# Patient Record
Sex: Female | Born: 1955 | Race: White | Hispanic: No | Marital: Married | Smoking: Never smoker
Health system: Southern US, Community
[De-identification: ages and names within clinical notes are randomized; demographics above are authoritative.]

## PROBLEM LIST (undated history)

## (undated) DIAGNOSIS — E785 Hyperlipidemia, unspecified: Secondary | ICD-10-CM

## (undated) DIAGNOSIS — I2699 Other pulmonary embolism without acute cor pulmonale: Secondary | ICD-10-CM

## (undated) DIAGNOSIS — I82409 Acute embolism and thrombosis of unspecified deep veins of unspecified lower extremity: Secondary | ICD-10-CM

## (undated) DIAGNOSIS — I2510 Atherosclerotic heart disease of native coronary artery without angina pectoris: Secondary | ICD-10-CM

## (undated) DIAGNOSIS — I255 Ischemic cardiomyopathy: Secondary | ICD-10-CM

## (undated) DIAGNOSIS — I10 Essential (primary) hypertension: Secondary | ICD-10-CM

## (undated) DIAGNOSIS — M1990 Unspecified osteoarthritis, unspecified site: Secondary | ICD-10-CM

## (undated) DIAGNOSIS — D689 Coagulation defect, unspecified: Secondary | ICD-10-CM

## (undated) DIAGNOSIS — H269 Unspecified cataract: Secondary | ICD-10-CM

## (undated) DIAGNOSIS — E119 Type 2 diabetes mellitus without complications: Secondary | ICD-10-CM

## (undated) DIAGNOSIS — E079 Disorder of thyroid, unspecified: Secondary | ICD-10-CM

## (undated) DIAGNOSIS — Z952 Presence of prosthetic heart valve: Secondary | ICD-10-CM

## (undated) DIAGNOSIS — I350 Nonrheumatic aortic (valve) stenosis: Secondary | ICD-10-CM

## (undated) DIAGNOSIS — R011 Cardiac murmur, unspecified: Secondary | ICD-10-CM

## (undated) DIAGNOSIS — I447 Left bundle-branch block, unspecified: Secondary | ICD-10-CM

## (undated) DIAGNOSIS — I70202 Unspecified atherosclerosis of native arteries of extremities, left leg: Secondary | ICD-10-CM

## (undated) HISTORY — DX: Left bundle-branch block, unspecified: I44.7

## (undated) HISTORY — DX: Presence of prosthetic heart valve: Z95.2

## (undated) HISTORY — DX: Unspecified osteoarthritis, unspecified site: M19.90

## (undated) HISTORY — DX: Unspecified atherosclerosis of native arteries of extremities, left leg: I70.202

## (undated) HISTORY — DX: Hyperlipidemia, unspecified: E78.5

## (undated) HISTORY — DX: Other pulmonary embolism without acute cor pulmonale: I26.99

## (undated) HISTORY — PX: ASD REPAIR: SHX258

## (undated) HISTORY — DX: Type 2 diabetes mellitus without complications: E11.9

## (undated) HISTORY — DX: Atherosclerotic heart disease of native coronary artery without angina pectoris: I25.10

## (undated) HISTORY — DX: Ischemic cardiomyopathy: I25.5

## (undated) HISTORY — DX: Disorder of thyroid, unspecified: E07.9

## (undated) HISTORY — DX: Essential (primary) hypertension: I10

## (undated) HISTORY — DX: Coagulation defect, unspecified: D68.9

## (undated) HISTORY — DX: Acute embolism and thrombosis of unspecified deep veins of unspecified lower extremity: I82.409

## (undated) HISTORY — DX: Unspecified cataract: H26.9

## (undated) HISTORY — PX: OTHER SURGICAL HISTORY: SHX169

## (undated) HISTORY — DX: Nonrheumatic aortic (valve) stenosis: I35.0

## (undated) HISTORY — PX: AORTIC VALVE REPLACEMENT (AVR)/CORONARY ARTERY BYPASS GRAFTING (CABG): SHX5725

## (undated) HISTORY — DX: Cardiac murmur, unspecified: R01.1

---

## 2001-05-05 ENCOUNTER — Encounter: Payer: Self-pay | Admitting: *Deleted

## 2001-05-05 ENCOUNTER — Ambulatory Visit (HOSPITAL_COMMUNITY): Admission: RE | Admit: 2001-05-05 | Discharge: 2001-05-05 | Payer: Self-pay | Admitting: *Deleted

## 2010-08-06 HISTORY — PX: NM MYOCAR PERF WALL MOTION: HXRAD629

## 2010-08-07 ENCOUNTER — Inpatient Hospital Stay (HOSPITAL_COMMUNITY)
Admission: EM | Admit: 2010-08-07 | Discharge: 2010-08-22 | Payer: Self-pay | Source: Home / Self Care | Attending: Internal Medicine | Admitting: Internal Medicine

## 2010-08-07 ENCOUNTER — Encounter (INDEPENDENT_AMBULATORY_CARE_PROVIDER_SITE_OTHER): Payer: Self-pay | Admitting: Internal Medicine

## 2010-08-07 HISTORY — PX: OTHER SURGICAL HISTORY: SHX169

## 2010-08-08 HISTORY — PX: OTHER SURGICAL HISTORY: SHX169

## 2010-08-08 HISTORY — PX: CARDIAC CATHETERIZATION: SHX172

## 2010-08-09 ENCOUNTER — Encounter: Payer: Self-pay | Admitting: Cardiothoracic Surgery

## 2010-08-09 DIAGNOSIS — Z01810 Encounter for preprocedural cardiovascular examination: Secondary | ICD-10-CM

## 2010-08-09 DIAGNOSIS — I2510 Atherosclerotic heart disease of native coronary artery without angina pectoris: Secondary | ICD-10-CM

## 2010-08-09 HISTORY — PX: DOPPLER ECHOCARDIOGRAPHY: SHX263

## 2010-08-12 LAB — COMPREHENSIVE METABOLIC PANEL
ALT: 16 U/L (ref 0–35)
ALT: 35 U/L (ref 0–35)
AST: 20 U/L (ref 0–37)
AST: 30 U/L (ref 0–37)
Albumin: 1.8 g/dL — ABNORMAL LOW (ref 3.5–5.2)
Albumin: 3.3 g/dL — ABNORMAL LOW (ref 3.5–5.2)
Alkaline Phosphatase: 49 U/L (ref 39–117)
Alkaline Phosphatase: 62 U/L (ref 39–117)
BUN: 10 mg/dL (ref 6–23)
BUN: 14 mg/dL (ref 6–23)
CO2: 23 mEq/L (ref 19–32)
CO2: 27 mEq/L (ref 19–32)
Calcium: 7.5 mg/dL — ABNORMAL LOW (ref 8.4–10.5)
Calcium: 8.4 mg/dL (ref 8.4–10.5)
Chloride: 108 mEq/L (ref 96–112)
Chloride: 102 mEq/L (ref 96–112)
Creatinine, Ser: 1.28 mg/dL — ABNORMAL HIGH (ref 0.4–1.2)
Creatinine, Ser: 0.85 mg/dL (ref 0.4–1.2)
GFR calc Af Amer: 53 mL/min — ABNORMAL LOW (ref >60)
GFR calc Af Amer: >60 mL/min (ref >60)
GFR calc non Af Amer: 44 mL/min — ABNORMAL LOW (ref >60)
GFR calc non Af Amer: >60 mL/min (ref >60)
Glucose, Bld: 93 mg/dL (ref 70–99)
Glucose, Bld: 191 mg/dL — ABNORMAL HIGH (ref 70–99)
Potassium: 3.7 mEq/L (ref 3.5–5.1)
Potassium: 3.3 mEq/L — ABNORMAL LOW (ref 3.5–5.1)
Sodium: 137 mEq/L (ref 135–145)
Sodium: 137 mEq/L (ref 135–145)
Total Bilirubin: 0.6 mg/dL (ref 0.3–1.2)
Total Bilirubin: 1.8 mg/dL — ABNORMAL HIGH (ref 0.3–1.2)
Total Protein: 4.7 g/dL — ABNORMAL LOW (ref 6.0–8.3)
Total Protein: 5.9 g/dL — ABNORMAL LOW (ref 6.0–8.3)

## 2010-08-12 LAB — URINE CULTURE
Colony Count: NO GROWTH
Culture  Setup Time: 201201121938
Culture: NO GROWTH

## 2010-08-12 LAB — GLUCOSE, CAPILLARY
Glucose-Capillary: 287 mg/dL — ABNORMAL HIGH (ref 70–99)
Glucose-Capillary: 269 mg/dL — ABNORMAL HIGH (ref 70–99)
Glucose-Capillary: 187 mg/dL — ABNORMAL HIGH (ref 70–99)
Glucose-Capillary: 205 mg/dL — ABNORMAL HIGH (ref 70–99)
Glucose-Capillary: 202 mg/dL — ABNORMAL HIGH (ref 70–99)
Glucose-Capillary: 283 mg/dL — ABNORMAL HIGH (ref 70–99)
Glucose-Capillary: 218 mg/dL — ABNORMAL HIGH (ref 70–99)
Glucose-Capillary: 264 mg/dL — ABNORMAL HIGH (ref 70–99)
Glucose-Capillary: 189 mg/dL — ABNORMAL HIGH (ref 70–99)
Glucose-Capillary: 171 mg/dL — ABNORMAL HIGH (ref 70–99)
Glucose-Capillary: 247 mg/dL — ABNORMAL HIGH (ref 70–99)
Glucose-Capillary: 259 mg/dL — ABNORMAL HIGH (ref 70–99)

## 2010-08-12 LAB — URINALYSIS, ROUTINE W REFLEX MICROSCOPIC
Bilirubin Urine: NEGATIVE
Ketones, ur: NEGATIVE mg/dL
Leukocytes, UA: NEGATIVE
Nitrite: NEGATIVE
Protein, ur: NEGATIVE mg/dL
Specific Gravity, Urine: 1.014 (ref 1.005–1.030)
Urine Glucose, Fasting: NEGATIVE mg/dL
Urobilinogen, UA: 0.2 mg/dL (ref 0.0–1.0)
pH: 8.5 — ABNORMAL HIGH (ref 5.0–8.0)

## 2010-08-12 LAB — BASIC METABOLIC PANEL
BUN: 13 mg/dL (ref 6–23)
BUN: 12 mg/dL (ref 6–23)
BUN: 16 mg/dL (ref 6–23)
CO2: 28 mEq/L (ref 19–32)
CO2: 23 mEq/L (ref 19–32)
CO2: 26 mEq/L (ref 19–32)
Calcium: 8.7 mg/dL (ref 8.4–10.5)
Calcium: 8.9 mg/dL (ref 8.4–10.5)
Calcium: 9 mg/dL (ref 8.4–10.5)
Chloride: 100 mEq/L (ref 96–112)
Chloride: 106 mEq/L (ref 96–112)
Chloride: 106 mEq/L (ref 96–112)
Creatinine, Ser: 0.76 mg/dL (ref 0.4–1.2)
Creatinine, Ser: 0.73 mg/dL (ref 0.4–1.2)
Creatinine, Ser: 0.8 mg/dL (ref 0.4–1.2)
GFR calc Af Amer: >60 mL/min (ref >60)
GFR calc Af Amer: >60 mL/min (ref >60)
GFR calc Af Amer: >60 mL/min (ref >60)
GFR calc non Af Amer: >60 mL/min (ref >60)
GFR calc non Af Amer: >60 mL/min (ref >60)
GFR calc non Af Amer: >60 mL/min (ref >60)
Glucose, Bld: 179 mg/dL — ABNORMAL HIGH (ref 70–99)
Glucose, Bld: 191 mg/dL — ABNORMAL HIGH (ref 70–99)
Glucose, Bld: 135 mg/dL — ABNORMAL HIGH (ref 70–99)
Potassium: 3 mEq/L — ABNORMAL LOW (ref 3.5–5.1)
Potassium: 3.7 mEq/L (ref 3.5–5.1)
Potassium: 3.3 mEq/L — ABNORMAL LOW (ref 3.5–5.1)
Sodium: 138 mEq/L (ref 135–145)
Sodium: 138 mEq/L (ref 135–145)
Sodium: 141 mEq/L (ref 135–145)

## 2010-08-12 LAB — DIFFERENTIAL
Basophils Absolute: 0 10*3/uL (ref 0.0–0.1)
Basophils Relative: 0 % (ref 0–1)
Eosinophils Absolute: 0.2 10*3/uL (ref 0.0–0.7)
Eosinophils Relative: 2 % (ref 0–5)
Lymphocytes Relative: 32 % (ref 12–46)
Lymphs Abs: 3.5 10*3/uL (ref 0.7–4.0)
Monocytes Absolute: 0.6 10*3/uL (ref 0.1–1.0)
Monocytes Relative: 5 % (ref 3–12)
Neutro Abs: 6.5 10*3/uL (ref 1.7–7.7)
Neutrophils Relative %: 61 % (ref 43–77)

## 2010-08-12 LAB — POCT I-STAT 3, VENOUS BLOOD GAS (G3P V)
Acid-Base Excess: 3 mmol/L — ABNORMAL HIGH (ref 0.0–2.0)
Bicarbonate: 27.9 mEq/L — ABNORMAL HIGH (ref 20.0–24.0)
O2 Saturation: 61 %
TCO2: 29 mmol/L (ref 0–100)
pCO2, Ven: 41.4 mmHg — ABNORMAL LOW (ref 45.0–50.0)
pH, Ven: 7.437 — ABNORMAL HIGH (ref 7.250–7.300)
pO2, Ven: 31 mmHg (ref 30.0–45.0)

## 2010-08-12 LAB — CBC
HCT: 44.2 % (ref 36.0–46.0)
HCT: 39.6 % (ref 36.0–46.0)
HCT: 43.2 % (ref 36.0–46.0)
HCT: 40.6 % (ref 36.0–46.0)
Hemoglobin: 14.6 g/dL (ref 12.0–15.0)
Hemoglobin: 12.6 g/dL (ref 12.0–15.0)
Hemoglobin: 13.7 g/dL (ref 12.0–15.0)
Hemoglobin: 12.8 g/dL (ref 12.0–15.0)
MCH: 28.4 pg (ref 26.0–34.0)
MCH: 27.6 pg (ref 26.0–34.0)
MCH: 27.6 pg (ref 26.0–34.0)
MCH: 27.6 pg (ref 26.0–34.0)
MCHC: 33 g/dL (ref 30.0–36.0)
MCHC: 31.8 g/dL (ref 30.0–36.0)
MCHC: 31.7 g/dL (ref 30.0–36.0)
MCHC: 31.5 g/dL (ref 30.0–36.0)
MCV: 86 fL (ref 78.0–100.0)
MCV: 86.8 fL (ref 78.0–100.0)
MCV: 87.1 fL (ref 78.0–100.0)
MCV: 87.5 fL (ref 78.0–100.0)
Platelets: 316 10*3/uL (ref 150–400)
Platelets: 258 10*3/uL (ref 150–400)
Platelets: 253 10*3/uL (ref 150–400)
Platelets: 267 10*3/uL (ref 150–400)
RBC: 5.14 MIL/uL — ABNORMAL HIGH (ref 3.87–5.11)
RBC: 4.56 MIL/uL (ref 3.87–5.11)
RBC: 4.96 MIL/uL (ref 3.87–5.11)
RBC: 4.64 MIL/uL (ref 3.87–5.11)
RDW: 15 % (ref 11.5–15.5)
RDW: 14.7 % (ref 11.5–15.5)
RDW: 14.9 % (ref 11.5–15.5)
RDW: 14.8 % (ref 11.5–15.5)
WBC: 10.7 10*3/uL — ABNORMAL HIGH (ref 4.0–10.5)
WBC: 10.1 10*3/uL (ref 4.0–10.5)
WBC: 10.7 10*3/uL — ABNORMAL HIGH (ref 4.0–10.5)
WBC: 10.2 10*3/uL (ref 4.0–10.5)

## 2010-08-12 LAB — HOMOCYSTEINE: Homocysteine: 14.4 umol/L (ref 4.0–15.4)

## 2010-08-12 LAB — BLOOD GAS, ARTERIAL
Acid-Base Excess: 3.2 mmol/L — ABNORMAL HIGH (ref 0.0–2.0)
Bicarbonate: 26.2 mEq/L — ABNORMAL HIGH (ref 20.0–24.0)
Drawn by: 270271
FIO2: 0.21 %
O2 Saturation: 94 %
Patient temperature: 98.6
TCO2: 27.2 mmol/L (ref 0–100)
pCO2 arterial: 33.2 mmHg — ABNORMAL LOW (ref 35.0–45.0)
pH, Arterial: 7.509 — ABNORMAL HIGH (ref 7.350–7.400)
pO2, Arterial: 63.6 mmHg — ABNORMAL LOW (ref 80.0–100.0)

## 2010-08-12 LAB — POCT I-STAT 3, ART BLOOD GAS (G3+)
Acid-Base Excess: 3 mmol/L — ABNORMAL HIGH (ref 0.0–2.0)
Bicarbonate: 25.9 mEq/L — ABNORMAL HIGH (ref 20.0–24.0)
O2 Saturation: 95 %
TCO2: 27 mmol/L (ref 0–100)
pCO2 arterial: 34.7 mmHg — ABNORMAL LOW (ref 35.0–45.0)
pH, Arterial: 7.482 — ABNORMAL HIGH (ref 7.350–7.400)
pO2, Arterial: 71 mmHg — ABNORMAL LOW (ref 80.0–100.0)

## 2010-08-12 LAB — CARDIAC PANEL(CRET KIN+CKTOT+MB+TROPI)
CK, MB: 2.4 ng/mL (ref 0.3–4.0)
CK, MB: 1.1 ng/mL (ref 0.3–4.0)
CK, MB: 0.9 ng/mL (ref 0.3–4.0)
Relative Index: INVALID (ref 0.0–2.5)
Relative Index: INVALID (ref 0.0–2.5)
Relative Index: INVALID (ref 0.0–2.5)
Total CK: 30 U/L (ref 7–177)
Total CK: 28 U/L (ref 7–177)
Total CK: 29 U/L (ref 7–177)
Troponin I: 0.04 ng/mL (ref 0.00–0.06)
Troponin I: 0.02 ng/mL (ref 0.00–0.06)
Troponin I: 0.05 ng/mL (ref 0.00–0.06)

## 2010-08-12 LAB — HEMOGLOBIN A1C
Hgb A1c MFr Bld: 12.1 % — ABNORMAL HIGH (ref <5.7)
Mean Plasma Glucose: 301 mg/dL — ABNORMAL HIGH (ref <117)

## 2010-08-12 LAB — HEPARIN LEVEL (UNFRACTIONATED)
Heparin Unfractionated: <0.1 IU/mL — ABNORMAL LOW (ref 0.30–0.70)
Heparin Unfractionated: 0.23 IU/mL — ABNORMAL LOW (ref 0.30–0.70)
Heparin Unfractionated: 0.42 IU/mL (ref 0.30–0.70)
Heparin Unfractionated: 0.44 IU/mL (ref 0.30–0.70)
Heparin Unfractionated: 0.96 IU/mL — ABNORMAL HIGH (ref 0.30–0.70)
Heparin Unfractionated: <0.1 IU/mL — ABNORMAL LOW (ref 0.30–0.70)
Heparin Unfractionated: 0.4 IU/mL (ref 0.30–0.70)

## 2010-08-12 LAB — BRAIN NATRIURETIC PEPTIDE
Pro B Natriuretic peptide (BNP): 817 pg/mL — ABNORMAL HIGH (ref 0.0–100.0)
Pro B Natriuretic peptide (BNP): 1407 pg/mL — ABNORMAL HIGH (ref 0.0–100.0)
Pro B Natriuretic peptide (BNP): 578 pg/mL — ABNORMAL HIGH (ref 0.0–100.0)

## 2010-08-12 LAB — MRSA PCR SCREENING: MRSA by PCR: NEGATIVE

## 2010-08-12 LAB — PROTIME-INR
INR: 1.1 (ref 0.00–1.49)
Prothrombin Time: 14.4 seconds (ref 11.6–15.2)

## 2010-08-12 LAB — ANTITHROMBIN III: AntiThromb III Func: 91 % (ref 75–120)

## 2010-08-12 LAB — TSH: TSH: 1.597 u[IU]/mL (ref 0.350–4.500)

## 2010-08-12 LAB — APTT: aPTT: 31 seconds (ref 24–37)

## 2010-08-12 LAB — URINE MICROSCOPIC-ADD ON

## 2010-08-14 LAB — URINALYSIS, MICROSCOPIC ONLY
Bilirubin Urine: NEGATIVE
Ketones, ur: NEGATIVE mg/dL
Leukocytes, UA: NEGATIVE
Nitrite: NEGATIVE
Protein, ur: NEGATIVE mg/dL
Specific Gravity, Urine: 1.01 (ref 1.005–1.030)
Urine Glucose, Fasting: NEGATIVE mg/dL
Urobilinogen, UA: 1 mg/dL (ref 0.0–1.0)
pH: 6 (ref 5.0–8.0)

## 2010-08-14 LAB — BASIC METABOLIC PANEL
BUN: 16 mg/dL (ref 6–23)
BUN: 17 mg/dL (ref 6–23)
CO2: 24 mEq/L (ref 19–32)
CO2: 26 mEq/L (ref 19–32)
Calcium: 9 mg/dL (ref 8.4–10.5)
Calcium: 9.3 mg/dL (ref 8.4–10.5)
Chloride: 104 mEq/L (ref 96–112)
Chloride: 106 mEq/L (ref 96–112)
Creatinine, Ser: 0.75 mg/dL (ref 0.4–1.2)
Creatinine, Ser: 0.74 mg/dL (ref 0.4–1.2)
GFR calc Af Amer: >60 mL/min (ref >60)
GFR calc Af Amer: >60 mL/min (ref >60)
GFR calc non Af Amer: >60 mL/min (ref >60)
GFR calc non Af Amer: >60 mL/min (ref >60)
Glucose, Bld: 116 mg/dL — ABNORMAL HIGH (ref 70–99)
Glucose, Bld: 142 mg/dL — ABNORMAL HIGH (ref 70–99)
Potassium: 3.6 mEq/L (ref 3.5–5.1)
Potassium: 3.7 mEq/L (ref 3.5–5.1)
Sodium: 137 mEq/L (ref 135–145)
Sodium: 140 mEq/L (ref 135–145)

## 2010-08-14 LAB — BRAIN NATRIURETIC PEPTIDE: Pro B Natriuretic peptide (BNP): 407 pg/mL — ABNORMAL HIGH (ref 0.0–100.0)

## 2010-08-14 LAB — CBC
HCT: 39.5 % (ref 36.0–46.0)
HCT: 41.1 % (ref 36.0–46.0)
Hemoglobin: 12 g/dL (ref 12.0–15.0)
Hemoglobin: 13 g/dL (ref 12.0–15.0)
MCH: 26.5 pg (ref 26.0–34.0)
MCH: 27.4 pg (ref 26.0–34.0)
MCHC: 30.4 g/dL (ref 30.0–36.0)
MCHC: 31.6 g/dL (ref 30.0–36.0)
MCV: 87.2 fL (ref 78.0–100.0)
MCV: 86.5 fL (ref 78.0–100.0)
Platelets: 258 10*3/uL (ref 150–400)
Platelets: 265 10*3/uL (ref 150–400)
RBC: 4.53 MIL/uL (ref 3.87–5.11)
RBC: 4.75 MIL/uL (ref 3.87–5.11)
RDW: 14.7 % (ref 11.5–15.5)
RDW: 14.7 % (ref 11.5–15.5)
WBC: 9.2 10*3/uL (ref 4.0–10.5)
WBC: 9.9 10*3/uL (ref 4.0–10.5)

## 2010-08-14 LAB — GLUCOSE, CAPILLARY
Glucose-Capillary: 201 mg/dL — ABNORMAL HIGH (ref 70–99)
Glucose-Capillary: 234 mg/dL — ABNORMAL HIGH (ref 70–99)
Glucose-Capillary: 189 mg/dL — ABNORMAL HIGH (ref 70–99)
Glucose-Capillary: 139 mg/dL — ABNORMAL HIGH (ref 70–99)
Glucose-Capillary: 207 mg/dL — ABNORMAL HIGH (ref 70–99)
Glucose-Capillary: 181 mg/dL — ABNORMAL HIGH (ref 70–99)
Glucose-Capillary: 259 mg/dL — ABNORMAL HIGH (ref 70–99)
Glucose-Capillary: 172 mg/dL — ABNORMAL HIGH (ref 70–99)
Glucose-Capillary: 137 mg/dL — ABNORMAL HIGH (ref 70–99)
Glucose-Capillary: 202 mg/dL — ABNORMAL HIGH (ref 70–99)

## 2010-08-14 LAB — LUPUS ANTICOAGULANT PANEL
DRVVT: 45.6 secs — ABNORMAL HIGH (ref 36.2–44.3)
Lupus Anticoagulant: NOT DETECTED
PTT Lupus Anticoagulant: 39.2 secs (ref 30.0–45.6)
dRVVT Incubated 1:1 Mix: 42.5 secs (ref 36.2–44.3)

## 2010-08-14 LAB — HEPARIN LEVEL (UNFRACTIONATED)
Heparin Unfractionated: 0.55 IU/mL (ref 0.30–0.70)
Heparin Unfractionated: 0.57 IU/mL (ref 0.30–0.70)

## 2010-08-14 LAB — CARDIOLIPIN ANTIBODIES, IGG, IGM, IGA
Anticardiolipin IgA: 1 APL U/mL — ABNORMAL LOW (ref <22)
Anticardiolipin IgG: 2 GPL U/mL — ABNORMAL LOW (ref <23)
Anticardiolipin IgM: 5 MPL U/mL — ABNORMAL LOW (ref <11)

## 2010-08-16 ENCOUNTER — Encounter: Payer: Self-pay | Admitting: Cardiothoracic Surgery

## 2010-08-16 HISTORY — PX: CORONARY ARTERY BYPASS GRAFT: SHX141

## 2010-08-19 LAB — PROTEIN C, TOTAL: Protein C, Total: 117 % (ref 70–140)

## 2010-08-19 LAB — COMPREHENSIVE METABOLIC PANEL
ALT: 21 U/L (ref 0–35)
AST: 20 U/L (ref 0–37)
Albumin: 3.5 g/dL (ref 3.5–5.2)
Alkaline Phosphatase: 69 U/L (ref 39–117)
BUN: 15 mg/dL (ref 6–23)
CO2: 22 mEq/L (ref 19–32)
Calcium: 9.2 mg/dL (ref 8.4–10.5)
Chloride: 106 mEq/L (ref 96–112)
Creatinine, Ser: 0.74 mg/dL (ref 0.4–1.2)
GFR calc Af Amer: >60 mL/min (ref >60)
GFR calc non Af Amer: >60 mL/min (ref >60)
Glucose, Bld: 114 mg/dL — ABNORMAL HIGH (ref 70–99)
Potassium: 3.5 mEq/L (ref 3.5–5.1)
Sodium: 139 mEq/L (ref 135–145)
Total Bilirubin: 0.8 mg/dL (ref 0.3–1.2)
Total Protein: 6.6 g/dL (ref 6.0–8.3)

## 2010-08-19 LAB — CBC
HCT: 39.5 % (ref 36.0–46.0)
HCT: 37.1 % (ref 36.0–46.0)
HCT: 26.4 % — ABNORMAL LOW (ref 36.0–46.0)
HCT: 27 % — ABNORMAL LOW (ref 36.0–46.0)
Hemoglobin: 12.6 g/dL (ref 12.0–15.0)
Hemoglobin: 11.4 g/dL — ABNORMAL LOW (ref 12.0–15.0)
Hemoglobin: 8.5 g/dL — ABNORMAL LOW (ref 12.0–15.0)
Hemoglobin: 8.9 g/dL — ABNORMAL LOW (ref 12.0–15.0)
MCH: 27.5 pg (ref 26.0–34.0)
MCH: 26.9 pg (ref 26.0–34.0)
MCH: 28.1 pg (ref 26.0–34.0)
MCH: 28.7 pg (ref 26.0–34.0)
MCHC: 31.9 g/dL (ref 30.0–36.0)
MCHC: 30.7 g/dL (ref 30.0–36.0)
MCHC: 32.2 g/dL (ref 30.0–36.0)
MCHC: 33 g/dL (ref 30.0–36.0)
MCV: 86.1 fL (ref 78.0–100.0)
MCV: 87.5 fL (ref 78.0–100.0)
MCV: 87.4 fL (ref 78.0–100.0)
MCV: 87.1 fL (ref 78.0–100.0)
Platelets: 271 10*3/uL (ref 150–400)
Platelets: 258 10*3/uL (ref 150–400)
Platelets: 293 10*3/uL (ref 150–400)
Platelets: 139 10*3/uL — ABNORMAL LOW (ref 150–400)
Platelets: 141 10*3/uL — ABNORMAL LOW (ref 150–400)
RBC: 4.59 MIL/uL (ref 3.87–5.11)
RBC: 4.01 MIL/uL (ref 3.87–5.11)
RBC: 4.24 MIL/uL (ref 3.87–5.11)
RBC: 3.02 MIL/uL — ABNORMAL LOW (ref 3.87–5.11)
RBC: 3.1 MIL/uL — ABNORMAL LOW (ref 3.87–5.11)
RDW: 14.8 % (ref 11.5–15.5)
RDW: 15.2 % (ref 11.5–15.5)
RDW: 15.3 % (ref 11.5–15.5)
RDW: 14.6 % (ref 11.5–15.5)
RDW: 14.8 % (ref 11.5–15.5)
WBC: 10.3 10*3/uL (ref 4.0–10.5)
WBC: 8.6 10*3/uL (ref 4.0–10.5)
WBC: 11.7 10*3/uL — ABNORMAL HIGH (ref 4.0–10.5)
WBC: 8.3 10*3/uL (ref 4.0–10.5)
WBC: 8.8 10*3/uL (ref 4.0–10.5)

## 2010-08-19 LAB — POCT I-STAT 3, ART BLOOD GAS (G3+)
Bicarbonate: 25 mEq/L — ABNORMAL HIGH (ref 20.0–24.0)
Bicarbonate: 22.3 mEq/L (ref 20.0–24.0)
Patient temperature: 35.6
Patient temperature: 37.4
TCO2: 26 mmol/L (ref 0–100)
TCO2: 23 mmol/L (ref 0–100)
pCO2 arterial: 42.1 mmHg (ref 35.0–45.0)
pCO2 arterial: 36.6 mmHg (ref 35.0–45.0)
pH, Arterial: 7.376 (ref 7.350–7.400)
pH, Arterial: 7.394 (ref 7.350–7.400)
pO2, Arterial: 56 mmHg — ABNORMAL LOW (ref 80.0–100.0)

## 2010-08-19 LAB — GLUCOSE, CAPILLARY
Glucose-Capillary: 277 mg/dL — ABNORMAL HIGH (ref 70–99)
Glucose-Capillary: 113 mg/dL — ABNORMAL HIGH (ref 70–99)
Glucose-Capillary: 107 mg/dL — ABNORMAL HIGH (ref 70–99)
Glucose-Capillary: 197 mg/dL — ABNORMAL HIGH (ref 70–99)
Glucose-Capillary: 146 mg/dL — ABNORMAL HIGH (ref 70–99)

## 2010-08-19 LAB — URINE CULTURE
Colony Count: >=100000
Culture  Setup Time: 201201171616
Special Requests: NEGATIVE

## 2010-08-19 LAB — APTT: aPTT: 106 seconds — ABNORMAL HIGH (ref 24–37)

## 2010-08-19 LAB — PROTIME-INR
INR: 1.12 (ref 0.00–1.49)
Prothrombin Time: 14.6 seconds (ref 11.6–15.2)

## 2010-08-19 LAB — BASIC METABOLIC PANEL
BUN: 10 mg/dL (ref 6–23)
CO2: 26 mEq/L (ref 19–32)
Calcium: 9.5 mg/dL (ref 8.4–10.5)
Chloride: 104 mEq/L (ref 96–112)
Creatinine, Ser: 0.78 mg/dL (ref 0.4–1.2)
GFR calc Af Amer: >60 mL/min (ref >60)
GFR calc non Af Amer: >60 mL/min (ref >60)
Glucose, Bld: 182 mg/dL — ABNORMAL HIGH (ref 70–99)
Potassium: 4.3 mEq/L (ref 3.5–5.1)
Sodium: 139 mEq/L (ref 135–145)

## 2010-08-19 LAB — POCT I-STAT, CHEM 8
BUN: 7 mg/dL (ref 6–23)
Calcium, Ion: 1.17 mmol/L (ref 1.12–1.32)
Chloride: 112 mEq/L (ref 96–112)
Creatinine, Ser: 0.7 mg/dL (ref 0.4–1.2)
Glucose, Bld: 160 mg/dL — ABNORMAL HIGH (ref 70–99)

## 2010-08-19 LAB — CREATININE, SERUM
Creatinine, Ser: 0.72 mg/dL (ref 0.4–1.2)
GFR calc Af Amer: >60 mL/min (ref >60)
GFR calc non Af Amer: >60 mL/min (ref >60)

## 2010-08-19 LAB — BRAIN NATRIURETIC PEPTIDE: Pro B Natriuretic peptide (BNP): 289 pg/mL — ABNORMAL HIGH (ref 0.0–100.0)

## 2010-08-19 LAB — PLATELET COUNT: Platelets: 139 10*3/uL — ABNORMAL LOW (ref 150–400)

## 2010-08-19 LAB — POCT I-STAT 4, (NA,K, GLUC, HGB,HCT): Glucose, Bld: 133 mg/dL — ABNORMAL HIGH (ref 70–99)

## 2010-08-19 LAB — FACTOR 5 LEIDEN

## 2010-08-19 LAB — HEPARIN LEVEL (UNFRACTIONATED): Heparin Unfractionated: 0.66 IU/mL (ref 0.30–0.70)

## 2010-08-19 LAB — MAGNESIUM: Magnesium: 3.1 mg/dL — ABNORMAL HIGH (ref 1.5–2.5)

## 2010-08-19 LAB — HEMOGLOBIN AND HEMATOCRIT, BLOOD
HCT: 35.3 % — ABNORMAL LOW (ref 36.0–46.0)
Hemoglobin: 11 g/dL — ABNORMAL LOW (ref 12.0–15.0)

## 2010-08-19 NOTE — Op Note (Signed)
NAMEYOUA, GOELZ NO.:  [id number]  MEDICAL RECORD NO.:  [id number]          PATIENT TYPE:  INP  LOCATION:  2307                         FACILITY:  [facility]  PHYSICIAN:  Kerin Perna, M.D.  DATE OF BIRTH:  [birth year]  DATE OF PROCEDURE:  08/16/2010 DATE OF DISCHARGE:                              OPERATIVE REPORT   OPERATIONS: 1. Aortic valve replacement with a 19-mm mechanical St. Jude valve     (serial number [id number]). 2. Coronary artery bypass grafting x3 (left internal mammary artery to     LAD, saphenous vein graft to distal circumflex, saphenous vein     graft to posterior descending).  SURGEON:  Kerin Perna, MD  ASSISTANT:  Coral Ceo, PA-C  PREOPERATIVE DIAGNOSES: 1. Critical aortic stenosis. 2. Moderate to severe left main and three-vessel coronary artery     disease. 3. Class 3 congestive heart failure.  POSTOPERATIVE DIAGNOSES: 1. Critical aortic stenosis. 2. Moderate to severe left main and three-vessel coronary artery     disease. 3. Class 3 congestive heart failure.  ANESTHESIA:  General by Dr. Arta Bruce.  INDICATIONS:  The patient is a 55 year old newly diagnosed diabetic female who presented with symptoms of heart failure and a 2-D echo at her cardiologist's office indicating severe aortic stenosis.  She was also found to have an occluded left popliteal artery and severe diabetes mellitus as a new diagnosis.  She underwent combined right and left heart cath and PCI of her popliteal artery approximately 1 week ago. She was found to have aortic valve area of 0.5 with a gradient of 80 mmHg.  Her EF was 40% and she had a 50% left main stenosis and subtotal stenosis of the circumflex and posterior descending.  Within 48 hours of cath, she had persistent chest pain and a CT angiogram demonstrated pulmonary emboli to the right lung.  She was maintained on IV heparin for an additional 6 days to allow recover from the pulmonary  embolus until this operation was scheduled.  During that period of time, her symptoms of CHF were improved, her diabetes was much more readily controlled, and she had good perfusion of both lower extremities.  She did sustain a groin bleed at her cath site approximately 1 week after the cath and required 2 blood cell transfusions.  Prior to surgery, I reviewed the results of her echo and cath and discussed the indications and benefits of aortic valve replacement with combined coronary artery bypass grafting for treatment of severe aortic stenosis and three-vessel coronary artery disease.  It was her preference with which I agreed that she have a mechanical aortic valve replacement due to her age of 3 years.  She understood that she would require long-term Coumadin for the pulmonary embolus probably for at least a year.  I discussed the major issues of the surgery including location of the surgical incisions, the choice of conduit to include internal mammary artery and endoscopically harvested saphenous vein, the use of general anesthesia and cardiopulmonary bypass, and the expected postoperative hospital recovery.  She understood the risks of this operation to include stroke, MI, bleeding,  blood transfusion requirement, infection, and death.  After reviewing these issues, she demonstrated her understanding and agreed to proceed with surgery under what I felt was an informed consent.  OPERATIVE FINDINGS: 1. Severe bicuspid aortic stenosis corrected with a St. Jude AVR with     TEE demonstrating good valvular function postop. 2. Intraoperative anemia requiring 2 units of packed cell transfusion. 3. Significant pulmonary hypertension with PA pressures of 60/30 after     induction of anesthesia which improved to 40/20 after AVR.  PROCEDURE:  The patient was brought to the operating room and placed supine on the operating table where general anesthesia was induced.  A transesophageal 2-D  echo probe was placed by Dr. Michelle Piper.  This confirmed the preoperative diagnosis of severe aortic stenosis, mild MR, and mild to moderate LV dysfunction.  There was no evidence of an ASD per report. The patient was prepped and draped as a sterile field.  A sternal incision was made as the saphenous vein was harvested from the left leg. The saphenous vein was visualized in the right leg, but was too small to use.  It was not harvested from the right leg.  The vein in left leg was good quality.  The left internal mammary artery was harvested as a pedicle graft.  This was a small 1.2-mm vessel, but had excellent flow. The sternal retractor was placed and the pericardium was opened and suspended.  The right heart was enlarged.  The pulmonary artery was enlarged.  After the vein was harvested, heparin was administered and the ACT was documented as being therapeutic.  Pursestrings were placed in the ascending aorta and right atrium and the patient was cannulated and placed on cardiopulmonary bypass.  A left ventricular vent was placed via the right superior pulmonary vein.  The coronaries were identified for grafting and the mammary artery and vein grafts were prepared for the distal anastomoses.  Cardioplegia catheters were placed for both antegrade and retrograde cold blood cardioplegia.  The patient was cooled to 32 degrees and the aortic crossclamp was applied.  800 mL of cold blood cardioplegia was delivered in split doses between the antegrade aortic and retrograde coronary sinus catheters.  There was a good cardioplegic arrest and septal temperature dropped less than 12 degrees.  Cardioplegia was delivered every 20 minutes or less while the crossclamp was in place.  The distal coronary anastomoses were then performed.  First distal anastomosis was to the posterior descending branch of the right coronary artery.  This was a 1.5-mm vessel with proximal 80% stenosis.  Reversed saphenous vein  was sewn end-to-side with running 7-0 Prolene with good flow through the graft.  The second distal anastomosis was to the distal circumflex.  This had diffuse atherosclerotic disease.  There was a proximal 50% left main stenosis.  The vessel itself was only 1.2 mm.  A reversed saphenous vein was sewn end-to-side with running 7-0 Prolene. There was adequate flow through graft.  The third distal anastomosis was to the LAD.  This had diffuse atherosclerotic disease and inflammation. It was, however, a 1.5-mm vessel.  The left IMA pedicle was brought through an opening and the pericardium was brought down onto the LAD and sewn end-to-side with running 8-0 Prolene.  There was good flow through the anastomosis after briefly releasing the pedicle bulldog on the mammary artery.  The bulldog was reapplied and a pedicle was secured to the epicardium.  Cardioplegia was redosed.  Attention was then directed to the aortic valve.  A transverse aortotomy was performed.  The aortic valve was inspected.  It was bicuspid, calcified, stenotic, thickened, and almost immobile.  It was excised and the annulus was debrided carefully of calcium.  The outflow tract was irrigated with copious amounts of cold saline.  The annulus was sized to a 19-mm St. Jude valve.  The preop echo measured the LV outflow tract at 19 mm.  Subannular 2-0 Ethibond sutures were placed around the annulus numbering 14 sutures in total.  The St. Jude valve was then placed on the sterile field and the suture was passed to the sewing ring and the valve was seated and oriented in proper position and the sutures were tied.  The valve opened and closed without impingement and there was no evidence of spacing in between the sutures and the coronary ostium were widely patent.  The aortotomy was then closed in a 2-layer running suture of 4-0 Prolene with pledgets at the ends.  Cardioplegia was redosed.  At this point, the two proximal vein  anastomoses were performed on the ascending aorta using a 4.0-mm punch with running 7-0 Prolene.  Prior to tying down the final proximal anastomosis, air was vented from the coronaries with a dose of retrograde warm blood cardioplegia.  The crossclamp was then removed.  The heart resumed a spontaneous rhythm.  The cardioplegia catheters were removed.  Air was aspirated from the vein grafts with a 27 gauge needle. The vein grafts were opened and each had adequate flow.  The proximal and distal anastomoses were checked and found to be hemostatic.  The aortotomy was checked and found to be hemostatic.  Temporary pacing wires were applied.  The patient was rewarmed to 37 degrees.  The superior pericardial fat was closed over the aorta.  Two mediastinal and a left pleural chest tube were placed and brought out through the separate incisions.  The sternum was closed with interrupted steel wire. The pectoralis fascia was closed using running #1 Vicryl.  Subcutaneous and skin layers were closed using running Vicryl and sterile dressings were applied.  Total bypass time was 181 minutes.     Kerin Perna, M.D.     PV/MEDQ  D:  08/16/2010  T:  08/17/2010  Job:  [id number]  cc:   Nanetta Batty, M.D.  Electronically Signed by Kerin Perna M.D. on 08/19/2010 08:23:33 AM

## 2010-08-19 NOTE — Consult Note (Signed)
NAMERAYNEE, IDA NO.:  [id number]  MEDICAL RECORD NO.:  [id number]          PATIENT TYPE:  INP  LOCATION:  2926                         FACILITY:  [facility]  PHYSICIAN:  Kerin Perna, M.D.  DATE OF BIRTH:  08-Oct-1955  DATE OF CONSULTATION:  08/08/2010 DATE OF DISCHARGE:                                CONSULTATION   REASON FOR CONSULTATION:  Severe aortic stenosis, moderate three-vessel coronary artery disease, class III CHF.  HISTORY OF PRESENT ILLNESS:  I was asked to evaluate this 55 year old Caucasian female, diabetic, nonsmoker for possible aortic valve replacement combined with coronary artery bypass grafting.  She was recently evaluated by Dr. Rennis Golden for exertional dyspnea, chest discomfort, ischemic left leg pain, and decreased exercise tolerance.  A 2-D echo in the office demonstrated EF of 40% with global hypokinesia and severe aortic stenosis with a peak gradient of 77 mmHg.  Left ventricular outflow tract.  Diameter was 19 mm.  She had a heavily calcified valve.  She had mild-to-moderate MR and mild-to-moderate TR and trace AI.  The patient underwent a stress test, which was fairly at low risk.  However, the EF was reduced at 35%.  A duplex ultrasound of her left leg demonstrated no DVT, but she did have evidence of a thrombus in the left popliteal artery.  Her blood glucose was elevated at 360, her BNP was elevated at 1230, and her total cholesterol was 229. Fortunately, her creatinine was normal at 0.87.  Because of all these problems, she was admitted to the hospital and placed on IV heparin.  On August 08, 2010, she underwent a left and right heart cath by Dr. Allyson Sabal.  This demonstrated a 50% left main stenosis, 60% stenosis of the LAD, 80% stenosis of proximal posterior descending branch of the right coronary, and 60% stenosis of the circumflex.  The catheter did not cross the aortic valve.  PA pressures were elevated at 60/30 and  cardiac output was 4 liters per minute.  Pulmonary capillary wedge pressure was 27.  The patient also underwent percutaneous intervention of a thrombus in the left popliteal artery, which was treated with a PTCA and Angiomax infusion and opened as confirmed by angiography.  She had no significant renal artery stenosis or abdominal iliac disease.  Because of her severe aortic stenosis due to a bicuspid valve and moderate three-vessel disease with reduced LV function, a cardiothoracic surgical evaluation was requested.  The patient states she has had a cardiac murmur since age 51.  She denies history of rheumatic fever as a child.  There is no family history of valve disease or valve surgery. She denies using antibiotic prophylaxis before dental problems, and she denies any significant dental problems in the last 1-2 years.  There has been no history of endocarditis.  She denies orthopnea or PND despite a significant elevated BNP.  There is no history of arrhythmia.  FAMILY HISTORY:  Strongly positive for coronary artery disease.  There is no history of smoking.  PAST MEDICAL HISTORY: 1. Hypertension. 2. History of heart murmur. 3. Newly diagnosed diabetes mellitus with a hemoglobin A1c of 12.1.  4. Dyslipidemia. 5. Peripheral vascular disease treated with PTCA of the left popliteal     artery thrombus. 6. Recent diagnosis of a pulmonary embolus to the right lung by CTA     performed at this hospitalization.  CURRENT MEDICATIONS: 1. Cozaar 25 mg daily. 2. Toprol-XL 25 mg daily. 3. Crestor 20 mg daily. 4. Lasix 20 mg daily. 5. Aspirin 81 mg daily. 6. She is on IV heparin.  ALLERGIES:  No known drug allergies.  SOCIAL HISTORY:  She is married with two children.  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  She denies smoking or alcohol use.  REVIEW OF SYSTEMS:  She denies weight loss, fever, or night sweats.  ENT REVIEW:  Negative for active dental complaints or difficulty  swallowing. THORACIC REVIEW:  Negative for history of chest trauma or abnormal chest x-ray or recent symptoms of URI or hemoptysis.  CARDIAC REVIEW: Positive for her known heart murmur and her above cardiac problems with aortic stenosis, moderate coronary disease, and moderate LV dysfunction. PULMONARY:  She has pulmonary hypertension documented by right heart cath.  GI REVIEW:  Negative for hepatitis, jaundice, or blood per rectum.  ENDOCRINE REVIEW:  Negative for thyroid disease, positive for newly diagnosed diabetes.  VASCULAR REVIEW:  Positive for peripheral vascular disease, negative for TIA.  Carotid duplex scan is pending. NEUROLOGIC REVIEW:  Negative for stroke or seizure.  HEMATOLOGIC: Negative for bleeding or known clotting disorder or prior blood transfusion.  PHYSICAL EXAMINATION:  VITAL SIGNS:  The patient is 5 feet 3 inches and weighs 76 kg, blood pressure 116/80, pulse 80, temperature 97.3, saturation on room air 98%. GENERAL APPEARANCE:  A middle-aged attractive Caucasian female in no acute distress but anxious. HEENT:  Normocephalic.  Pupils are equal.  Dentition is good. NECK:  Positive for JVD, negative for bruit or palpable mass. LYMPHATIC:  Negative for adenopathy of the neck or axilla. CHEST:  Thorax is without deformity.  Clear breath sounds. CARDIAC:  Grade 3/6 systolic ejection murmur at the right upper sternal border. ABDOMEN:  Soft, nontender without pulsatile mass. EXTREMITIES:  No clubbing, cyanosis, or edema.  She has warm feet following her popliteal PTCA and positive Doppler pulses.  She has a compression dressing in the groin from her cardiac cath. NEUROLOGIC:  Alert and oriented without focal motor deficit.  LABORATORY DATA:  Her coronary arteriograms and X-ray reviewed.  Her 2-D echo is pending.  She also has a recent diagnosis of a pulmonary embolus with a large thrombus in the right upper lobe branch and a smaller thrombus in the right lower lobe  branch.  RECOMMENDATIONS:  The patient would benefit from combined aortic valve replacement and coronary artery bypass grafting.  However, with a recent pulmonary embolus and pulmonary hypertension, she will need to wait for 7-10 days for surgery.  In the meantime, her blood sugars can be pulled and her BNP improved.  We will complete the pre-CABG Doppler exams and also assess her for a hypercoagulability panel.  Thank your for consultation.     Kerin Perna, M.D.     PV/MEDQ  D:  08/09/2010  T:  08/10/2010  Job:  [id number]  cc:   Nanetta Batty, M.D. [place] Hilty, MD  Electronically Signed by Kerin Perna M.D. on 08/19/2010 22:29:79 AM

## 2010-08-20 LAB — CBC
HCT: 25.5 % — ABNORMAL LOW (ref 36.0–46.0)
HCT: 26 % — ABNORMAL LOW (ref 36.0–46.0)
HCT: 26 % — ABNORMAL LOW (ref 36.0–46.0)
HCT: 26.5 % — ABNORMAL LOW (ref 36.0–46.0)
Hemoglobin: 8.3 g/dL — ABNORMAL LOW (ref 12.0–15.0)
Hemoglobin: 8.3 g/dL — ABNORMAL LOW (ref 12.0–15.0)
Hemoglobin: 8.2 g/dL — ABNORMAL LOW (ref 12.0–15.0)
MCH: 28 pg (ref 26.0–34.0)
MCH: 28.6 pg (ref 26.0–34.0)
MCH: 27.8 pg (ref 26.0–34.0)
MCHC: 32.5 g/dL (ref 30.0–36.0)
MCHC: 31.9 g/dL (ref 30.0–36.0)
MCHC: 31.5 g/dL (ref 30.0–36.0)
MCHC: 31.3 g/dL (ref 30.0–36.0)
MCV: 86.1 fL (ref 78.0–100.0)
MCV: 89.7 fL (ref 78.0–100.0)
Platelets: 146 10*3/uL — ABNORMAL LOW (ref 150–400)
Platelets: 160 10*3/uL (ref 150–400)
RBC: 2.96 MIL/uL — ABNORMAL LOW (ref 3.87–5.11)
RBC: 2.9 MIL/uL — ABNORMAL LOW (ref 3.87–5.11)
RBC: 2.9 MIL/uL — ABNORMAL LOW (ref 3.87–5.11)
RDW: 14.8 % (ref 11.5–15.5)
RDW: 15.3 % (ref 11.5–15.5)
RDW: 15.5 % (ref 11.5–15.5)
WBC: 8.9 10*3/uL (ref 4.0–10.5)
WBC: 12.3 10*3/uL — ABNORMAL HIGH (ref 4.0–10.5)
WBC: 13.9 10*3/uL — ABNORMAL HIGH (ref 4.0–10.5)

## 2010-08-20 LAB — CROSSMATCH
ABO/RH(D): A POS
Antibody Screen: NEGATIVE
Unit division: 0
Unit division: 0
Unit division: 0
Unit division: 0
Unit division: 0
Unit division: 0

## 2010-08-20 LAB — POCT I-STAT 4, (NA,K, GLUC, HGB,HCT)
Glucose, Bld: 117 mg/dL — ABNORMAL HIGH (ref 70–99)
Glucose, Bld: 121 mg/dL — ABNORMAL HIGH (ref 70–99)
Glucose, Bld: 170 mg/dL — ABNORMAL HIGH (ref 70–99)
HCT: 29 % — ABNORMAL LOW (ref 36.0–46.0)
HCT: 27 % — ABNORMAL LOW (ref 36.0–46.0)
HCT: 19 % — ABNORMAL LOW (ref 36.0–46.0)
HCT: 22 % — ABNORMAL LOW (ref 36.0–46.0)
Hemoglobin: 9.9 g/dL — ABNORMAL LOW (ref 12.0–15.0)
Hemoglobin: 8.8 g/dL — ABNORMAL LOW (ref 12.0–15.0)
Hemoglobin: 6.5 g/dL — CL (ref 12.0–15.0)
Hemoglobin: 7.5 g/dL — ABNORMAL LOW (ref 12.0–15.0)
Potassium: 3.8 mEq/L (ref 3.5–5.1)
Potassium: 4.5 mEq/L (ref 3.5–5.1)
Potassium: 3.5 mEq/L (ref 3.5–5.1)
Sodium: 139 mEq/L (ref 135–145)
Sodium: 141 mEq/L (ref 135–145)
Sodium: 140 mEq/L (ref 135–145)

## 2010-08-20 LAB — GLUCOSE, CAPILLARY
Glucose-Capillary: 104 mg/dL — ABNORMAL HIGH (ref 70–99)
Glucose-Capillary: 113 mg/dL — ABNORMAL HIGH (ref 70–99)
Glucose-Capillary: 157 mg/dL — ABNORMAL HIGH (ref 70–99)
Glucose-Capillary: 91 mg/dL (ref 70–99)
Glucose-Capillary: 93 mg/dL (ref 70–99)
Glucose-Capillary: 109 mg/dL — ABNORMAL HIGH (ref 70–99)
Glucose-Capillary: 133 mg/dL — ABNORMAL HIGH (ref 70–99)
Glucose-Capillary: 146 mg/dL — ABNORMAL HIGH (ref 70–99)
Glucose-Capillary: 153 mg/dL — ABNORMAL HIGH (ref 70–99)
Glucose-Capillary: 121 mg/dL — ABNORMAL HIGH (ref 70–99)
Glucose-Capillary: 134 mg/dL — ABNORMAL HIGH (ref 70–99)

## 2010-08-20 LAB — PREPARE PLATELETS: Unit division: 0

## 2010-08-20 LAB — MAGNESIUM
Magnesium: 2.6 mg/dL — ABNORMAL HIGH (ref 1.5–2.5)
Magnesium: 2.2 mg/dL (ref 1.5–2.5)

## 2010-08-20 LAB — POCT I-STAT 3, ART BLOOD GAS (G3+)
Acid-Base Excess: 2 mmol/L (ref 0.0–2.0)
Acid-base deficit: 1 mmol/L (ref 0.0–2.0)
Acid-base deficit: 1 mmol/L (ref 0.0–2.0)
Bicarbonate: 26 mEq/L — ABNORMAL HIGH (ref 20.0–24.0)
O2 Saturation: 100 %
O2 Saturation: 92 %
TCO2: 27 mmol/L (ref 0–100)
TCO2: 25 mmol/L (ref 0–100)
pCO2 arterial: 38.4 mmHg (ref 35.0–45.0)
pH, Arterial: 7.384 (ref 7.350–7.400)
pO2, Arterial: 315 mmHg — ABNORMAL HIGH (ref 80.0–100.0)

## 2010-08-20 LAB — BASIC METABOLIC PANEL
BUN: 7 mg/dL (ref 6–23)
BUN: 14 mg/dL (ref 6–23)
CO2: 24 mEq/L (ref 19–32)
CO2: 24 mEq/L (ref 19–32)
Calcium: 7.7 mg/dL — ABNORMAL LOW (ref 8.4–10.5)
Chloride: 114 mEq/L — ABNORMAL HIGH (ref 96–112)
Chloride: 104 mEq/L (ref 96–112)
Chloride: 105 mEq/L (ref 96–112)
Creatinine, Ser: 0.59 mg/dL (ref 0.4–1.2)
Creatinine, Ser: 0.69 mg/dL (ref 0.4–1.2)
GFR calc Af Amer: >60 mL/min (ref >60)
GFR calc non Af Amer: >60 mL/min (ref >60)
Glucose, Bld: 116 mg/dL — ABNORMAL HIGH (ref 70–99)
Glucose, Bld: 123 mg/dL — ABNORMAL HIGH (ref 70–99)
Glucose, Bld: 108 mg/dL — ABNORMAL HIGH (ref 70–99)
Potassium: 3.8 mEq/L (ref 3.5–5.1)
Potassium: 4.1 mEq/L (ref 3.5–5.1)
Potassium: 4 mEq/L (ref 3.5–5.1)
Sodium: 142 mEq/L (ref 135–145)
Sodium: 133 mEq/L — ABNORMAL LOW (ref 135–145)

## 2010-08-20 LAB — CREATININE, SERUM
Creatinine, Ser: 0.73 mg/dL (ref 0.4–1.2)
GFR calc Af Amer: >60 mL/min (ref >60)
GFR calc non Af Amer: >60 mL/min (ref >60)

## 2010-08-20 LAB — PREPARE FRESH FROZEN PLASMA

## 2010-08-20 LAB — BRAIN NATRIURETIC PEPTIDE: Pro B Natriuretic peptide (BNP): 411 pg/mL — ABNORMAL HIGH (ref 0.0–100.0)

## 2010-08-20 LAB — POCT I-STAT, CHEM 8
BUN: 12 mg/dL (ref 6–23)
Calcium, Ion: 1.17 mmol/L (ref 1.12–1.32)
Chloride: 105 mEq/L (ref 96–112)
HCT: 26 % — ABNORMAL LOW (ref 36.0–46.0)
Potassium: 5 mEq/L (ref 3.5–5.1)
Sodium: 137 mEq/L (ref 135–145)

## 2010-08-20 LAB — PROTIME-INR
INR: 1.49 (ref 0.00–1.49)
INR: 1.4 (ref 0.00–1.49)
Prothrombin Time: 17.4 seconds — ABNORMAL HIGH (ref 11.6–15.2)

## 2010-08-21 LAB — CBC
HCT: 26.2 % — ABNORMAL LOW (ref 36.0–46.0)
Hemoglobin: 8.6 g/dL — ABNORMAL LOW (ref 12.0–15.0)
MCH: 28.7 pg (ref 26.0–34.0)
MCHC: 32.8 g/dL (ref 30.0–36.0)
MCV: 87.3 fL (ref 78.0–100.0)
Platelets: 261 10*3/uL (ref 150–400)
RBC: 3 MIL/uL — ABNORMAL LOW (ref 3.87–5.11)
RDW: 15.5 % (ref 11.5–15.5)
WBC: 11.2 10*3/uL — ABNORMAL HIGH (ref 4.0–10.5)

## 2010-08-21 LAB — BASIC METABOLIC PANEL
BUN: 15 mg/dL (ref 6–23)
CO2: 25 mEq/L (ref 19–32)
Calcium: 8.5 mg/dL (ref 8.4–10.5)
Chloride: 104 mEq/L (ref 96–112)
Creatinine, Ser: 0.82 mg/dL (ref 0.4–1.2)
GFR calc Af Amer: >60 mL/min (ref >60)
GFR calc non Af Amer: >60 mL/min (ref >60)
Glucose, Bld: 128 mg/dL — ABNORMAL HIGH (ref 70–99)
Potassium: 3.8 mEq/L (ref 3.5–5.1)
Sodium: 139 mEq/L (ref 135–145)

## 2010-08-21 LAB — GLUCOSE, CAPILLARY
Glucose-Capillary: 119 mg/dL — ABNORMAL HIGH (ref 70–99)
Glucose-Capillary: 146 mg/dL — ABNORMAL HIGH (ref 70–99)
Glucose-Capillary: 108 mg/dL — ABNORMAL HIGH (ref 70–99)
Glucose-Capillary: 93 mg/dL (ref 70–99)

## 2010-08-21 LAB — PROTIME-INR
INR: 1.5 — ABNORMAL HIGH (ref 0.00–1.49)
INR: 2 — ABNORMAL HIGH (ref 0.00–1.49)
Prothrombin Time: 18.3 seconds — ABNORMAL HIGH (ref 11.6–15.2)
Prothrombin Time: 22.8 seconds — ABNORMAL HIGH (ref 11.6–15.2)

## 2010-08-22 LAB — BASIC METABOLIC PANEL
BUN: 8 mg/dL (ref 6–23)
CO2: 28 mEq/L (ref 19–32)
Calcium: 8.7 mg/dL (ref 8.4–10.5)
Chloride: 104 mEq/L (ref 96–112)
Creatinine, Ser: 0.84 mg/dL (ref 0.4–1.2)
GFR calc Af Amer: >60 mL/min (ref >60)
GFR calc non Af Amer: >60 mL/min (ref >60)
Glucose, Bld: 113 mg/dL — ABNORMAL HIGH (ref 70–99)
Potassium: 3.7 mEq/L (ref 3.5–5.1)
Sodium: 139 mEq/L (ref 135–145)

## 2010-08-22 LAB — PROTIME-INR
INR: 2.3 — ABNORMAL HIGH (ref 0.00–1.49)
Prothrombin Time: 25.4 seconds — ABNORMAL HIGH (ref 11.6–15.2)

## 2010-08-22 LAB — GLUCOSE, CAPILLARY: Glucose-Capillary: 144 mg/dL — ABNORMAL HIGH (ref 70–99)

## 2010-09-02 NOTE — Discharge Summary (Signed)
NAMEJAYLENNE, Katrina Obrien NO.:  [id number]  MEDICAL RECORD NO.:  [id number]          PATIENT TYPE:  INP  LOCATION:  2009                         FACILITY:  [facility]  PHYSICIAN:  Kerin Perna, M.D.  DATE OF BIRTH:  1956/07/16  DATE OF ADMISSION:  08/07/2010 DATE OF DISCHARGE:  08/22/2010                              DISCHARGE SUMMARY   PRIMARY ADMITTING DIAGNOSES: 1. Shortness of breath. 2. Left lower extremity pain.  ADDITIONAL/DISCHARGE DIAGNOSES: 1. Severe aortic stenosis. 2. Moderate-to-severe left main and 3-vessel coronary artery disease. 3. Class III congestive heart failure. 4. Left popliteal artery thrombus. 5. Pulmonary embolus. 6. Hypertension. 7. Dyslipidemia. 8. Diabetes mellitus, uncontrolled (hemoglobin A1c of 12.1 on     admission). 9. Peripheral vascular occlusive disease. 10.Postoperative acute blood loss anemia.  PROCEDURES PERFORMED: 1. Cardiac catheterization. 2. PV angiogram with PTA and AngioJet thrombolysis of left occluded     above-knee popliteal artery. 3. Aortic valve replacement with 19-mm mechanical St. Jude aortic     valve. 4. Coronary artery bypass grafting x3 (left internal mammary artery to     the LAD, saphenous vein graft to the distal circumflex, saphenous     vein graft to the posterior descending). 5. Endoscopic vein harvest left leg.  HISTORY:  The patient is a 55 year old female who was recently seen in the office by Dr. [place] Hilty for symptoms of increasing shortness of breath and fatigue.  She has a known history of a heart murmur in the past.  She was noted on evaluation to have EKG changes, showing borderline left atrial enlargement, borderline left ventricular hypertrophy, and poor R-wave progression.  An echocardiogram was performed which was markedly abnormal with an ejection fraction of 40%, global hypokinesia, and severe aortic stenosis with a peak gradient of 77 mmHg.  Her valve was bicuspid and  heavily calcified.  She had mild-to- moderate MR and mild-to-moderate TR with trace AI.  She then underwent a stress test which was fairly low risk, however, EF was reduced to 35%. She is also complaining of left lower extremity pain and duplex ultrasound was performed which showed no evidence of DVT, but she did have evidence of left popliteal artery thrombus.  Labs showed an elevated blood glucose at 360 and BNP of 1230 with a total cholesterol of 229.  Because of all these issues, she was admitted for further workup and treatment.  HOSPITAL COURSE:  [name] was admitted to [facility] on the date of admission.  She underwent cardiac catheterization on August 08, 2010, which included a left and right heart cath by Dr. Allyson Sabal.  This demonstrated a 50% left main stenosis with a 60% stenosis of the LAD, 80% stenosis of the proximal posterior descending branch of the right coronary, and a 60% stenosis of the circumflex.  Catheter did not cross the aortic valve.  PA pressures were elevated at 60/30 and cardiac output was 4 liters per minute with pulmonary capillary wedge pressure of 27.  She also underwent a peripheral vascular angiogram with percutaneous intervention to the left popliteal artery which was treated with PTCA and Angiomax infusion.  Post-infusion angiography revealed that the popliteal artery was open.  She was noted to have no significant renal artery stenosis or evidence of abdominal iliac disease.  Because of her severe aortic stenosis secondary to bicuspid valve and a moderate 3-vessel coronary artery disease, a Cardiac Surgery consult was requested.  The patient was seen by Dr. Kathlee Nations Trigt and her films were reviewed.  Dr. Donata Clay agreed with the need for coronary artery bypass grafting.  All risks, benefits, and alternatives of surgery were explained to the patient and she agreed to proceed. However, following her catheterization and peripheral  vascular intervention, the patient developed shortness of breath with abnormal blood gas.  A CT angiogram was performed which did confirm an acute pulmonary embolus on the right.  She was continued on heparin and it was felt prudent to allow her a week of IV heparin prior to proceeding with AVR CABG.  She otherwise remained stable prior to surgery.  Her preoperative workup included carotid Doppler studies which showed a 50- 69% ICA stenosis on the left with no significant stenosis on the right. ABIs were slightly diminished at 0.93 on the right and 0.84 on the left. She was taken to the operating room on August 16, 2010, where she underwent aortic valve replacement and coronary artery bypass grafting as described above performed by Dr. Donata Clay.  Please see previously dictated operative report for complete details of surgery.  She tolerated the procedure well and transferred to the SICU in stable condition.  She was extubated shortly after surgery.  She was hemodynamically stable and doing well on postop day #1.  She did have 1 episode of fever up to 101 which resolved without intervention.  She was also initially maintained on pressor agents for some hypotension.  By postop day #2, these drips were all weaned and discontinued.  Her chest tubes and hemodynamic monitoring lines were removed.  She was started on Coumadin for her recent PE and her mechanical valve.  By postop day #3, she was ready for transfer to the step-down unit.  Overall, her postoperative course has been uneventful.  She initially was maintained on Lantus and Glucommander insulin protocol for her hyperglycemia and once her p.o. intake had improved, her Lantus was decreased and ultimately discontinued and she was started on p.o. metformin. Currently, her blood sugars are relatively well controlled.  She has also been volume overloaded postoperatively and has been started on Lasix to which she is responding well.  She  does remain approximately 6 kg above her preoperative weight with lower extremity edema on physical exam.  Her Coumadin has been titrated upward and her INR is trending up presently at 2.0 with a PT of 22.8.  She has mostly maintained sinus rhythm, but has had brief episodes of sinus rhythm with PACs and a very brief runs of questionable atrial fibrillation or atrial flutter.  Her beta-blocker dosage has been increased and her rhythm has been carefully monitored.  She has otherwise remained afebrile and her vital signs have been stable.  Her most recent chest x-ray on January 24 shows minimal bibasilar atelectasis.  She does have small pleural effusions.  Her labs on August 20, 2010, show sodium 139, potassium 3.8, BUN 15, creatinine 0.82.  Hemoglobin 8.6, hematocrit 26.2, white count 11.2, platelets 261. We will recheck PT and INR on the morning of August 22, 2010.  It is anticipated that if she has remained stable and her INR is therapeutic and she is otherwise  doing well with no rhythm changes, she will be ready for discharge home at that time.  DISCHARGE MEDICATIONS: 1. Lasix 40 mg daily x7 days. 2. Metformin 1000 mg b.i.d. 3. Metoprolol 25 mg b.i.d. 4. Oxycodone IR 5-10 mg q.3-4 hours p.r.n. for pain.5. Potassium 20 mEq daily x7 days. 6. Coumadin 5 mg daily or as directed. 7. Aspirin 81 mg daily. 8. Crestor 20 mg at bedtime.  DISCHARGE INSTRUCTIONS:  She was asked to refrain from driving, heavy lifting, or strenuous activity.  She may continue ambulating daily and using her incentive spirometer.  She may shower daily and clean her incisions with soap and water.  She will continue  a low-fat, low- sodium, carbohydrate-modified diet.  DISCHARGE FOLLOWUP:  She will need to follow up with [facility] within 48 hours of discharge for management of her anticoagulation.  Because of her mechanical valve and her recent PE, her INR will need to  remain between 2.5 and 3.5.  Also, she will need to arrange an appointment with Dr. Rennis Golden in 2 weeks for Cardiology followup.  She will see Dr. Donata Clay in 3 weeks with a chest x-ray.  She will also need to follow up with her primary care physician in the next 1-2 weeks regarding her diabetes.     Coral Ceo, P.A.   ______________________________ Kerin Perna, M.D.    GC/MEDQ  D:  08/21/2010  T:  08/22/2010  Job:  [id number]  cc:   Louanna Raw [place] Hilty, MD [facility]  Electronically Signed by Weldon Inches. on 08/29/2010 01:02:45 PM Electronically Signed by Kerin Perna M.D. on 09/02/2010 11:39:12 AM

## 2010-09-10 ENCOUNTER — Other Ambulatory Visit: Payer: Self-pay | Admitting: Cardiothoracic Surgery

## 2010-09-10 DIAGNOSIS — I359 Nonrheumatic aortic valve disorder, unspecified: Secondary | ICD-10-CM

## 2010-09-10 DIAGNOSIS — I2510 Atherosclerotic heart disease of native coronary artery without angina pectoris: Secondary | ICD-10-CM

## 2010-09-11 ENCOUNTER — Ambulatory Visit
Admission: RE | Admit: 2010-09-11 | Discharge: 2010-09-11 | Disposition: A | Discharge status: Home / Self Care | Payer: Managed Care, Other (non HMO) | Source: Ambulatory Visit | Attending: Cardiothoracic Surgery | Admitting: Cardiothoracic Surgery

## 2010-09-11 ENCOUNTER — Ambulatory Visit (INDEPENDENT_AMBULATORY_CARE_PROVIDER_SITE_OTHER): Payer: Managed Care, Other (non HMO) | Admitting: Cardiothoracic Surgery

## 2010-09-11 DIAGNOSIS — I2510 Atherosclerotic heart disease of native coronary artery without angina pectoris: Secondary | ICD-10-CM

## 2010-09-11 DIAGNOSIS — I359 Nonrheumatic aortic valve disorder, unspecified: Secondary | ICD-10-CM

## 2010-09-12 NOTE — Assessment & Plan Note (Addendum)
OFFICE VISIT  Katrina, Obrien DOB:  08/18/1955                                        September 12, 2010 CHART #:  [id number]  CURRENT PROBLEMS: 1. Status post AVR - CABG x3 on August 16, 2010, for severe aortic     stenosis and multivessel coronary artery disease. 2. New onset diabetes mellitus. 3. Class III CHF. 4. Peripheral vascular disease status post PTCA of the left popliteal     artery.  PRESENT ILLNESS:  The patient is a very nice 55 year old Caucasian female nonsmoker who presented in heart failure with a severe aortic stenosis, significant three-vessel disease, and reduced LV function. She underwent aortic valve replacement with mechanical St. Jude prosthesis as well as bypass grafts to the LAD, distal circumflex, and posterior descending.  Postoperatively, she had a slow recovery due to her deconditioned state preoperatively.  Of note, she also had a pulmonary embolus when she presented with her CHF.  She was discharged home on Coumadin for mechanical valve and history of a PE as well as metoprolol 25 b.i.d., Crestor 20 mg a day, and metformin 1 g b.i.d., and aspirin 81 mg a day.  Since returning home, her cardiac status has been very stable, but she developed gastroenteritis with nausea, vomiting, and diarrhea.  She has been followed by that by Dr. Rennis Golden and a stool C. diff - culture is pending.  She has lost some weight, but she has no evidence of edema, symptoms of CHF or symptoms of angina.  The surgical incisions are well- healed.  She has had no bleeding or complications from her Coumadin. However INR has been somewhat erratic due to her gastrointestinal probable viral illness.  PHYSICAL EXAMINATION:  Vital Signs:  On exam, blood pressure is 138/80, pulse 100, respirations 20, saturation 100%.  General Appearance:  She appears to be somewhat drained by her gastroenteritis.  Breath sounds are clear.  Heart:  Cardiac rhythm is  regular.  The valve has a sharp closure sound without murmur.  Extremities:  There is no peripheral edema and the incisions are all healing well.  LABORATORY DATA:  Her chest x-ray shows clear lung fields.  No pleural effusion.  The sternal wires were all intact.  IMPRESSION:  The patient has done well after AVR CABG.  She still has not fully recovered and is not ready to start rehab until she gets over the gastroenteritis.  We discussed the importance of antibiotic prophylaxis before any future dental procedures.  The patient will return to her primary care and cardiology physicians and return here as needed.  Kerin Perna, M.D. Electronically Signed  PV/MEDQ  D:  09/12/2010  T:  09/12/2010  Job:  [id number]  cc:   K. [place] Hilty, MD

## 2010-09-17 ENCOUNTER — Other Ambulatory Visit: Payer: Self-pay | Admitting: Endocrinology

## 2010-09-17 DIAGNOSIS — E041 Nontoxic single thyroid nodule: Secondary | ICD-10-CM

## 2010-09-19 ENCOUNTER — Ambulatory Visit
Admission: RE | Admit: 2010-09-19 | Discharge: 2010-09-19 | Disposition: A | Discharge status: Home / Self Care | Payer: Managed Care, Other (non HMO) | Source: Ambulatory Visit | Attending: Endocrinology | Admitting: Endocrinology

## 2010-09-19 DIAGNOSIS — E041 Nontoxic single thyroid nodule: Secondary | ICD-10-CM

## 2010-10-09 ENCOUNTER — Encounter: Payer: Managed Care, Other (non HMO) | Attending: Endocrinology

## 2010-10-09 DIAGNOSIS — E119 Type 2 diabetes mellitus without complications: Secondary | ICD-10-CM | POA: Insufficient documentation

## 2010-10-09 DIAGNOSIS — Z713 Dietary counseling and surveillance: Secondary | ICD-10-CM | POA: Insufficient documentation

## 2010-12-10 NOTE — Procedures (Signed)
Katrina Obrien, Katrina Obrien               ACCOUNT NO.:  [id number]   MEDICAL RECORD NO.:  [id number]          PATIENT TYPE:  INP   LOCATION:  2807                         FACILITY:  [facility]   PHYSICIAN:  Nanetta Batty, M.D.   DATE OF BIRTH:  [birth year]   DATE OF PROCEDURE:  DATE OF DISCHARGE:                    PERIPHERAL VASCULAR INVASIVE PROCEDURE    PROCEDURE:  PV angiogram/PTA, AngioJet thrombolysis.   Katrina Obrien is a 55 year old married Caucasian female, who presents today  for left and left heart cath because of critical aortic stenosis.  She  did have acute onset of left calf pain and numbness last week and was  seen in the office yesterday by Dr. Rennis Golden after pedal pulses were noted  and the Doppler suggested an occluded popliteal.  She was admitted from  the office, heparinized, and presents now after her right and left heart  cath for angiography and potential endovascular therapy for critical  limb ischemia.   PROCEDURE DESCRIPTION:  The patient received Angiomax bolus with an ACT  of 323.  Contralateral access was obtained with a crossover catheter,  Versacore wire, and a 6-French crossover sheath.  Visipaque dye was used  for the entirety of the case.  Retrograde aortic pressures was monitored  during the case.  Abdominal aortography with bifemoral runoff was  obtained with initially 5-French pigtail catheter using bolus chase  digital subtraction step table technique.   ANGIOGRAPHIC RESULTS:  1. Abdominal aorta.      a.     Renal arteries normal.      b.     Renal abdominal aorta normal.  2. Left lower extremity;      a.     Occluded popliteal above the knee.  3. Right lower extremity; normal three-vessel runoff.   Contralateral access was obtained with a crossover catheter, 6-French  crossover sheath, and Wholey wire.  A Slip-Cath was then used to  exchange for a 300 cm length Versacore wire and 0.014 Quick-Cross was  used for support crossing the occluded popliteal  to the posterior  tibial.  PTA was performed with a 4 x 2 Fox stent, multiple occasions  showing antegrade flow though slow with obvious thrombus.  Following  this, AngioJet power pulse spray was used with a 10 mg of TNK.  Multiple  passes were performed resulting in restoration of antegrade flow in all  three vessels.  A 4-French AngioJet was used because of continued  occlusion of the posterior tibial, which resulted in antegrade flow with  80% segmental stenoses which were probably spasm.  200 mcg intra-  arterial nitroglycerin was given during the case.  The guidewire and  catheter was removed.  The sheath was then withdrawn across the  bifurcation and exchanged for a short 6-French sheath.  The patient left  the lab in stable condition.   IMPRESSION:  Successful percutaneous transluminal angioplasty, power  pulse spray, AngioJet thrombolysis  with TNK, restoring an occluded left  above-the-knee popliteal with three-vessel runoff in the setting of  critical limb ischemia.  Angiomax will be continued at a reduced rate.  Sheaths will then be removed and heparin  will be started in 8 hours.  The patient will ultimately require Coumadin anticoagulation.      Nanetta Batty, M.D.      JB/MEDQ  D:  08/08/2010  T:  08/09/2010  Job:  [id number]   cc:   Second Floor PV Angiographic [facility]  [facility]  Louanna Raw  [place] Hilty, MD   Electronically Signed by Nanetta Batty M.D. on 08/14/2010 05:44:28 PM

## 2011-03-12 IMAGING — US US SOFT TISSUE HEAD/NECK
1 series · 14 of 25 positions shown · non-contrast
Comparison: None.

And *RADIOLOGY REPORT*
CLINICAL DATA: History of thyroid nodule

THYROID ULTRASOUND
TECHNIQUE: Ultrasound examination of the thyroid gland and adjacent
soft tissues was performed.

[Series 1: us soft tissue head/neck · 0.05mm/px · 14 of 43 slices shown]
[im 1/43]
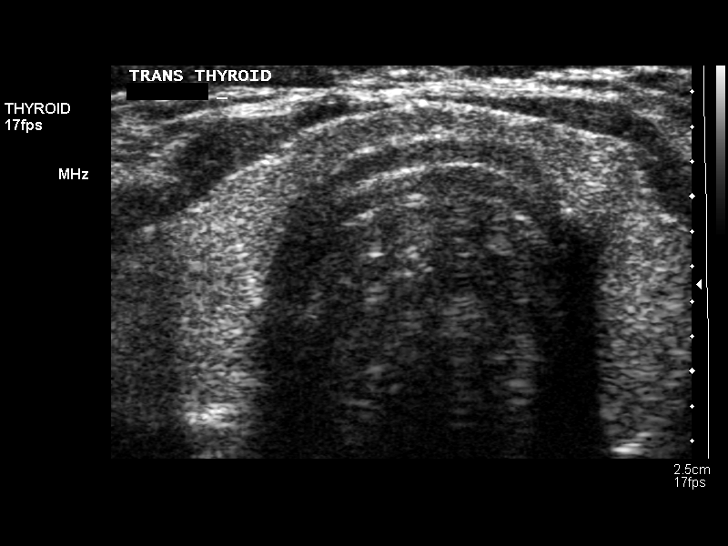
[im 4/43]
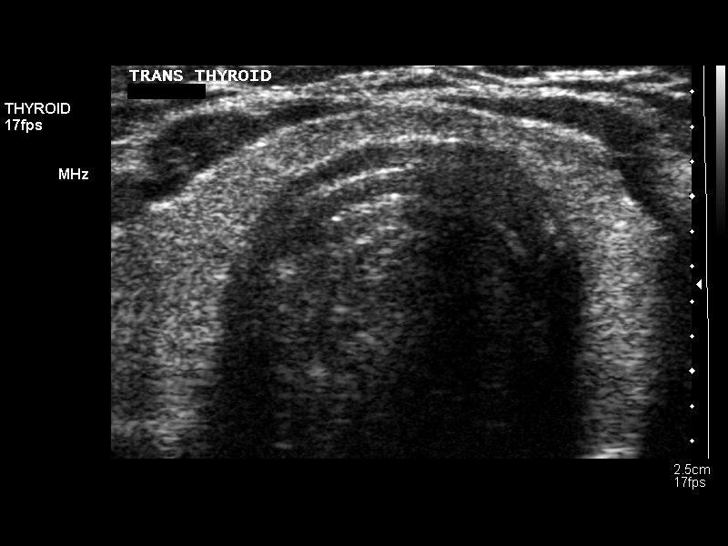
[im 8/43]
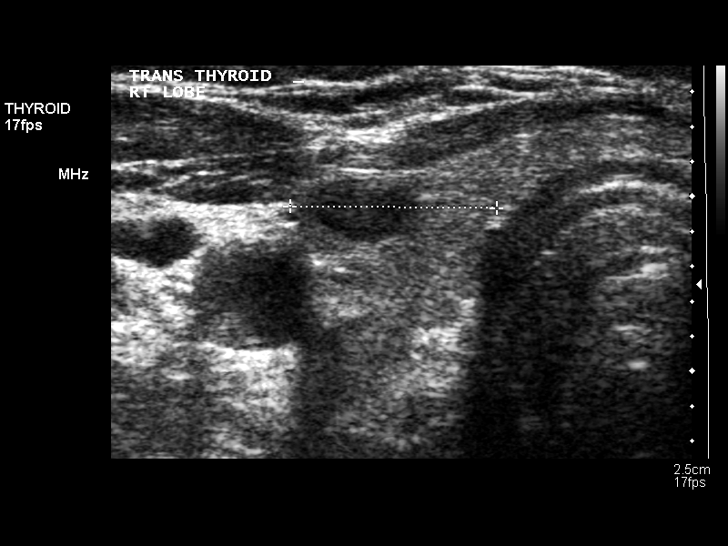
[im 11/43]
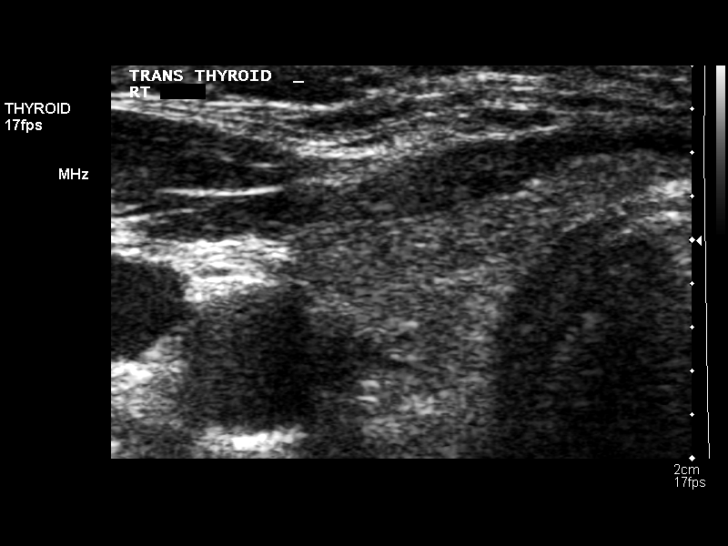
[im 15/43]
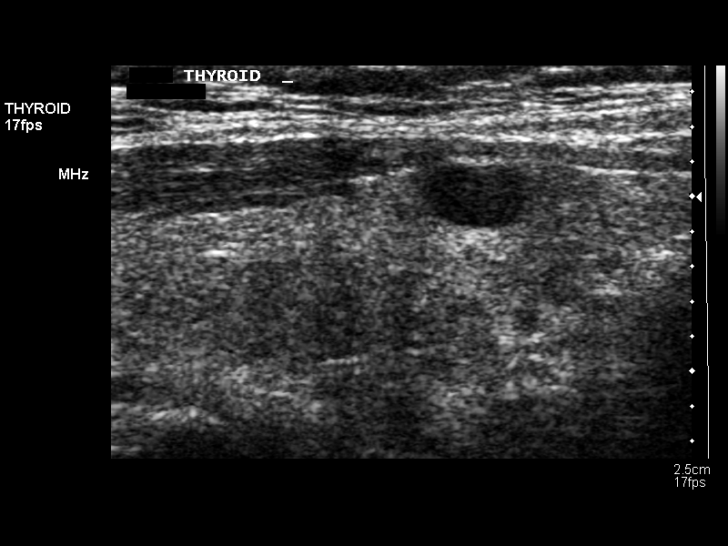
[im 16/43]
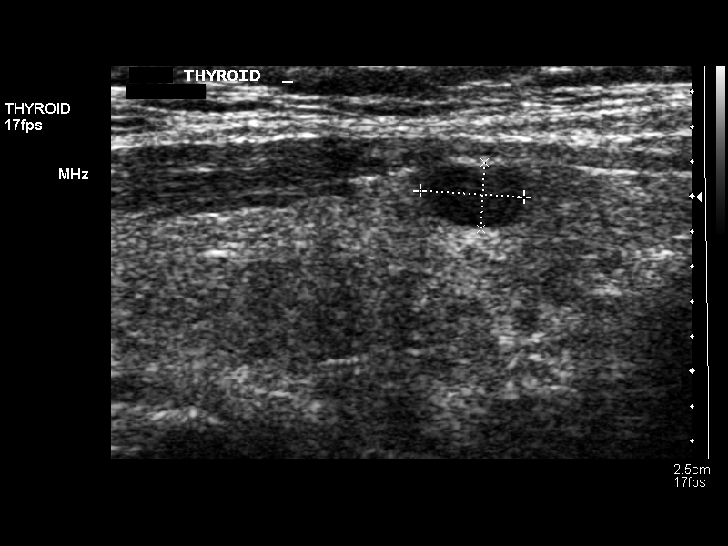
[im 20/43]
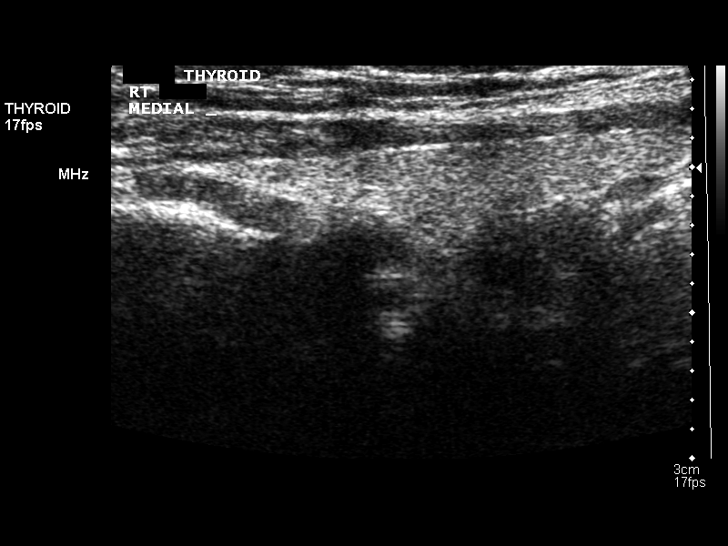
[im 23/43]
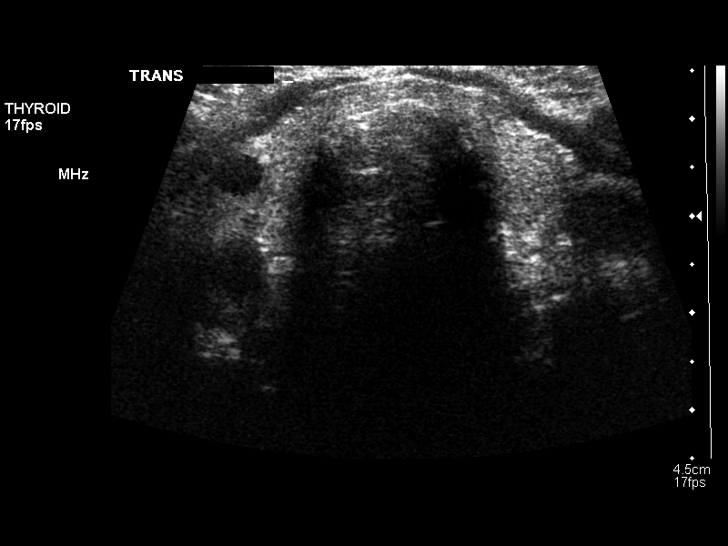
[im 27/43]
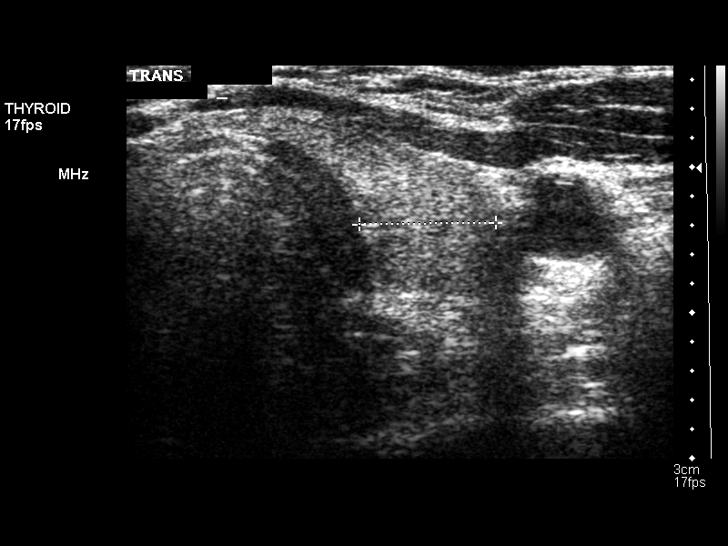
[im 29/43]
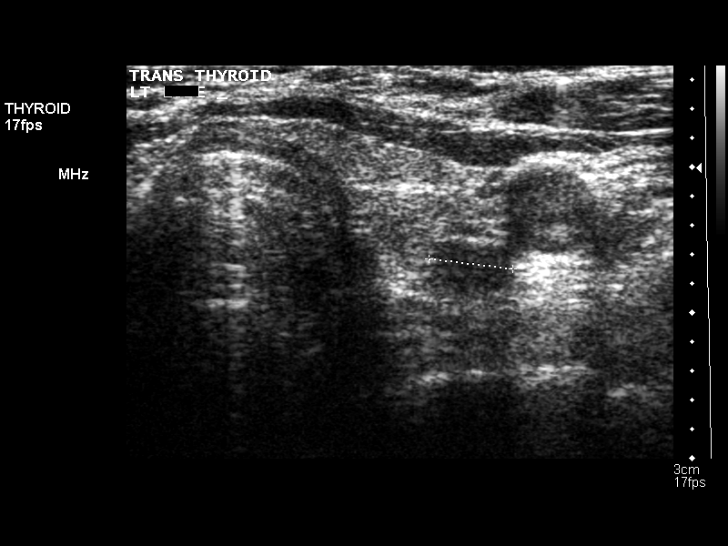
[im 32/43]
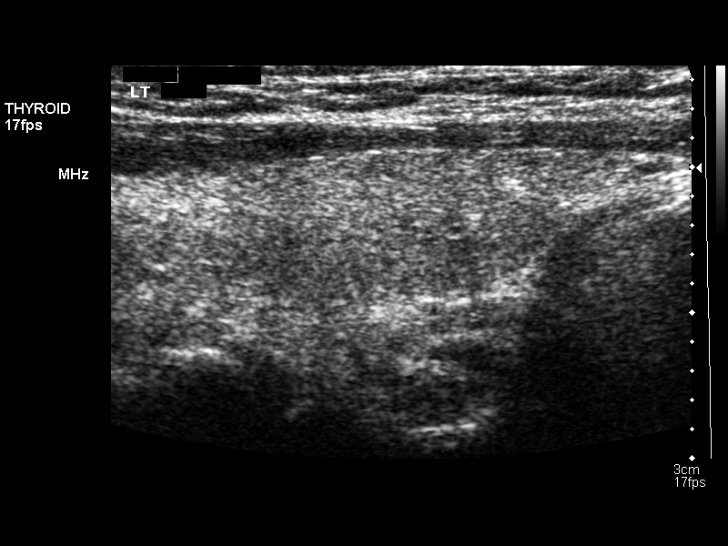
[im 36/43]
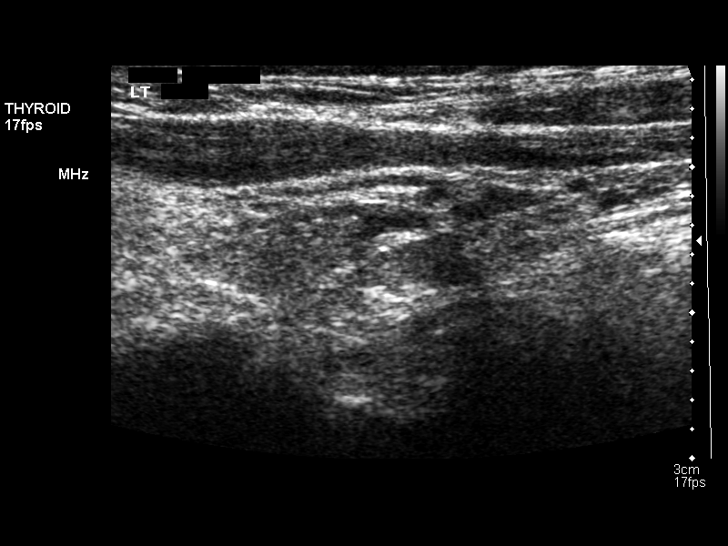
[im 39/43]
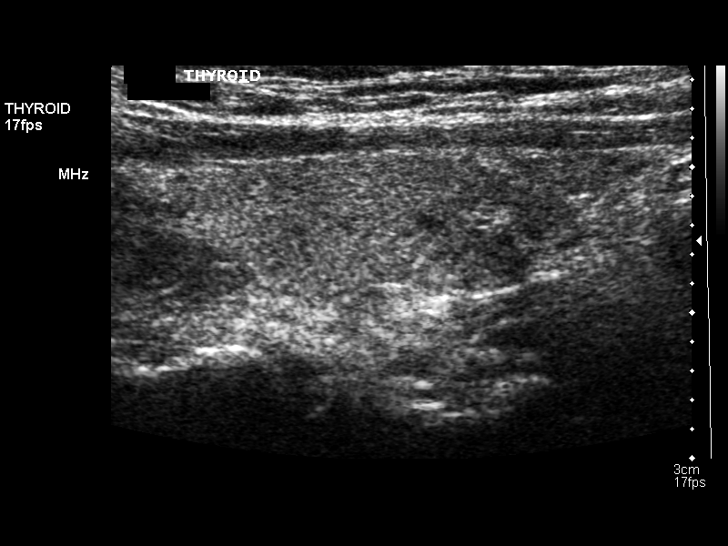
[im 43/43]
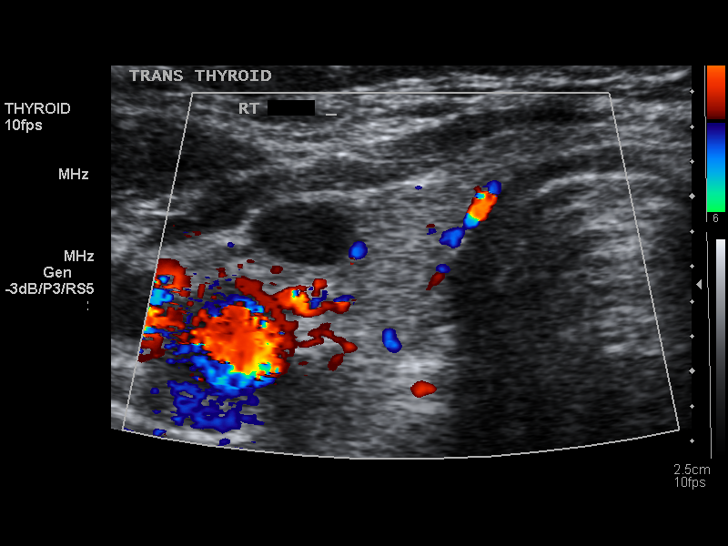

[14 of 25 positions shown; findings below may reference images not displayed]

FINDINGS: Right thyroid lobe:  4.2 x 1.3 x 1.2 cm.
Left thyroid lobe:  3.6 x 1.0 x 1.1 cm.
Isthmus:  2.1 mm.

Focal nodules:  The thyroid gland is homogeneous in echogenicity.
A small cystic nodule is noted in the lower pole on the right of 6
x 4 x 6 mm.  A hypoechoic but solid nodule is noted in the lower
pole on the left of 6 x 4 x 6 mm.

Lymphadenopathy:  None visualized.
IMPRESSION: The thyroid gland is normal in size with a single solid nodule in
the lower pole of the left lobe of 6 x 4 x 6 mm.

## 2012-10-11 ENCOUNTER — Ambulatory Visit: Payer: Self-pay | Admitting: Internal Medicine

## 2012-10-11 DIAGNOSIS — Z952 Presence of prosthetic heart valve: Secondary | ICD-10-CM

## 2012-10-11 DIAGNOSIS — Z7901 Long term (current) use of anticoagulants: Secondary | ICD-10-CM | POA: Insufficient documentation

## 2012-12-16 ENCOUNTER — Ambulatory Visit (INDEPENDENT_AMBULATORY_CARE_PROVIDER_SITE_OTHER): Payer: BC Managed Care – PPO | Admitting: Pharmacist Clinician (PhC)/ Clinical Pharmacy Specialist

## 2012-12-16 DIAGNOSIS — Z952 Presence of prosthetic heart valve: Secondary | ICD-10-CM

## 2012-12-16 DIAGNOSIS — Z7901 Long term (current) use of anticoagulants: Secondary | ICD-10-CM

## 2012-12-16 DIAGNOSIS — Z954 Presence of other heart-valve replacement: Secondary | ICD-10-CM

## 2012-12-17 ENCOUNTER — Other Ambulatory Visit: Payer: Self-pay | Admitting: Pharmacist Clinician (PhC)/ Clinical Pharmacy Specialist

## 2012-12-17 MED ORDER — FUROSEMIDE 40 MG PO TABS: ORAL_TABLET | ORAL | Status: DC

## 2012-12-17 MED ORDER — ROSUVASTATIN CALCIUM 20 MG PO TABS: 20.0000 mg | ORAL_TABLET | Freq: Every day | ORAL | Status: DC

## 2012-12-17 NOTE — Telephone Encounter (Signed)
Pt requested refills on crestor and lasix to [facility]

## 2012-12-21 ENCOUNTER — Other Ambulatory Visit: Payer: Self-pay | Admitting: Pharmacist Clinician (PhC)/ Clinical Pharmacy Specialist

## 2012-12-21 MED ORDER — ROSUVASTATIN CALCIUM 40 MG PO TABS: 40.0000 mg | ORAL_TABLET | Freq: Every day | ORAL | Status: DC

## 2012-12-21 NOTE — Telephone Encounter (Signed)
We sent Rx to [facility] last week for Crestor 20, should have been 40mg .  Will send correct RX strength to [facility].  Will probably still need PA.

## 2013-01-19 ENCOUNTER — Encounter: Payer: Self-pay | Admitting: Internal Medicine

## 2013-01-19 DIAGNOSIS — Z79899 Other long term (current) drug therapy: Secondary | ICD-10-CM

## 2013-01-19 DIAGNOSIS — E782 Mixed hyperlipidemia: Secondary | ICD-10-CM

## 2013-01-22 ENCOUNTER — Encounter: Payer: Self-pay | Admitting: *Deleted

## 2013-01-25 LAB — COMPREHENSIVE METABOLIC PANEL
AST: 22 U/L (ref 0–37)
Alkaline Phosphatase: 40 U/L (ref 39–117)
BUN: 22 mg/dL (ref 6–23)
Glucose, Bld: 123 mg/dL — ABNORMAL HIGH (ref 70–99)
Total Bilirubin: 0.6 mg/dL (ref 0.3–1.2)

## 2013-01-26 ENCOUNTER — Ambulatory Visit (INDEPENDENT_AMBULATORY_CARE_PROVIDER_SITE_OTHER): Payer: BC Managed Care – PPO | Admitting: Pharmacist Clinician (PhC)/ Clinical Pharmacy Specialist

## 2013-01-26 ENCOUNTER — Ambulatory Visit (INDEPENDENT_AMBULATORY_CARE_PROVIDER_SITE_OTHER): Payer: BC Managed Care – PPO | Admitting: Internal Medicine

## 2013-01-26 ENCOUNTER — Encounter: Payer: Self-pay | Admitting: Internal Medicine

## 2013-01-26 VITALS — BP 152/78 | HR 65 | Ht 63.0 in | Wt 173.4 lb

## 2013-01-26 DIAGNOSIS — I2699 Other pulmonary embolism without acute cor pulmonale: Secondary | ICD-10-CM

## 2013-01-26 DIAGNOSIS — E119 Type 2 diabetes mellitus without complications: Secondary | ICD-10-CM | POA: Insufficient documentation

## 2013-01-26 DIAGNOSIS — I82409 Acute embolism and thrombosis of unspecified deep veins of unspecified lower extremity: Secondary | ICD-10-CM | POA: Insufficient documentation

## 2013-01-26 DIAGNOSIS — I10 Essential (primary) hypertension: Secondary | ICD-10-CM

## 2013-01-26 DIAGNOSIS — I255 Ischemic cardiomyopathy: Secondary | ICD-10-CM

## 2013-01-26 DIAGNOSIS — I739 Peripheral vascular disease, unspecified: Secondary | ICD-10-CM

## 2013-01-26 DIAGNOSIS — I359 Nonrheumatic aortic valve disorder, unspecified: Secondary | ICD-10-CM

## 2013-01-26 DIAGNOSIS — Z7901 Long term (current) use of anticoagulants: Secondary | ICD-10-CM

## 2013-01-26 DIAGNOSIS — Z952 Presence of prosthetic heart valve: Secondary | ICD-10-CM

## 2013-01-26 DIAGNOSIS — I2589 Other forms of chronic ischemic heart disease: Secondary | ICD-10-CM

## 2013-01-26 DIAGNOSIS — Z951 Presence of aortocoronary bypass graft: Secondary | ICD-10-CM

## 2013-01-26 DIAGNOSIS — M79609 Pain in unspecified limb: Secondary | ICD-10-CM

## 2013-01-26 DIAGNOSIS — Z954 Presence of other heart-valve replacement: Secondary | ICD-10-CM

## 2013-01-26 DIAGNOSIS — E1165 Type 2 diabetes mellitus with hyperglycemia: Secondary | ICD-10-CM | POA: Insufficient documentation

## 2013-01-26 DIAGNOSIS — I2510 Atherosclerotic heart disease of native coronary artery without angina pectoris: Secondary | ICD-10-CM

## 2013-01-26 DIAGNOSIS — I743 Embolism and thrombosis of arteries of the lower extremities: Secondary | ICD-10-CM | POA: Insufficient documentation

## 2013-01-26 DIAGNOSIS — E7849 Other hyperlipidemia: Secondary | ICD-10-CM | POA: Insufficient documentation

## 2013-01-26 DIAGNOSIS — I350 Nonrheumatic aortic (valve) stenosis: Secondary | ICD-10-CM

## 2013-01-26 DIAGNOSIS — E785 Hyperlipidemia, unspecified: Secondary | ICD-10-CM

## 2013-01-26 LAB — NMR LIPOPROFILE WITH LIPIDS
Cholesterol, Total: 116 mg/dL (ref <200)
HDL Particle Number: 32.7 umol/L (ref >=30.5)
HDL-C: 39 mg/dL — ABNORMAL LOW (ref >=40)
LDL (calc): 55 mg/dL (ref <100)
LP-IR Score: 66 — ABNORMAL HIGH (ref <=45)
Large HDL-P: <1.3 umol/L — ABNORMAL LOW (ref >=4.8)
Triglycerides: 112 mg/dL (ref <150)

## 2013-01-26 LAB — POCT INR: INR: 2.7

## 2013-01-26 NOTE — Patient Instructions (Addendum)
Your physician has requested that you have a lower extremity arterial exercise duplex. During this test, exercise and ultrasound are used to evaluate arterial blood flow in the legs. Allow one hour for this exam. There are no restrictions or special instructions.  Your physician wants you to follow-up in: 6 months. You will receive a reminder letter in the mail two months in advance. If you don't receive a letter, please call our office to schedule the follow-up appointment.

## 2013-01-26 NOTE — Progress Notes (Signed)
OFFIC routine followup E NOTE  Chief Complaint:  Routine followup  Primary Care Physician: Aida Puffer, MD  HPI:  Katrina Obrien is a 57 year old female who has a history of an unfortunate episode with left popliteal artery thrombus, PE, severe aortic stenosis which was ultimately diagnosed in a bicuspid aortic valve, as well as multivessel coronary disease, anterior MI, EF about 40% to 45% - all which came to a head about the same time. She underwent 3-vessel bypass as well as mechanical aortic valve replacement. She has done well on Coumadin and was ultimately diagnosed with diabetes and is followed by Dr. Talmage Nap for this. Now she is markedly improved, although she has an ischemic cardiomyopathy with an EF of 40% to 45%. Her activity level is pretty good.  We recently performed repeat lipid testing, which demonstrated an elevated LDL particle number of 2163. The calculated LDL was 80. I asked her to increase her Crestor to 40 mg at night and add 500 mg Niaspan. She reports significant flushing and intolerance to the Niaspan, despite taking aspirin 30 minutes prior.  Her laboratory work does look improved, with her particle number being reduced to 1547, LDL 55, HDL of 39.  She does report however worsening pain in her capsule when she walks that works its way up her legs. This is nothing as significant as when she was originally diagnosed with her acute arterial thrombus, but has concerns for claudication. Alternatively, this could represent myalgias from her statin.  PMHx:  Past Medical History  Diagnosis Date  . Diabetes mellitus without complication   . Coronary artery disease     s/p 3- vessel bypass   . Ischemic cardiomyopathy     40 to 45%  . Hyperlipidemia   . Hypertension   . Pulmonary embolism     associated with DVT  . Aortic valve stenosis, severe     and bbicusipid aortic valve status post 19-mm St Jude mechanical aortic valve replacement  . Popliteal artery occlusion, left       recent  status  post angioplasty  . Heart murmur     Past Surgical History  Procedure Laterality Date  . Asd repair    . Aortic valve replacement (avr)/coronary artery bypass grafting (cabg)  08/16/2010  VAN TRIGT    AV replacement w/19-mm mechanical St Jude valve ([id number]),CABGX3  lima to LAD, SVG to distal circ,SVG to posterior descending  . Coronary artery bypass graft  08/16/2010    LIMA TO lad,svg to distal circ ,svg to posterior descending  . Doppler echocardiography  08/09/2010    EF35% to 40% ,bbicuspid ;severely  thickened ,severely calcified leaflets, LV normal  . Nuc    . Nm myocar perf wall motion  08/06/2010    EF 31%  LOW RISK SCAN  . Cardiac catheterization  08/08/2010    ,LAD 50% PROX,605 TANDEM SEGMENTAL STENOSIS,LEFT CIRC 60%-AV GROOVE, RGT COR 40% PROX ON THE BEND ,80% PROX PDA,  . Lea doppler  08/07/2010    left popliteal occlusion ,and anterior tibial complete occlusion  . Pv angiogram  08/08/2010    successful PTA LEFT POPLITEAL    FAMHx:  Family History  Problem Relation Age of Onset  . Cancer Mother   . Cancer Father   . Stroke Brother   . Heart attack Brother   . Hypertension Brother     SOCHx:   reports that she has never smoked. She does not have any smokeless tobacco history on file. She reports that  drinks alcohol. She reports that she does not use illicit drugs.  ALLERGIES:  No Known Allergies  ROS: A comprehensive review of systems was negative except for: Musculoskeletal: positive for myalgias and Questionable claudication  HOME MEDS: Current Outpatient Prescriptions  Medication Sig Dispense Refill  . aspirin EC 81 MG tablet Take 81 mg by mouth daily.      . Canagliflozin (INVOKANA) 100 MG TABS Take 100 mg by mouth daily.      Marland Kitchen ezetimibe (ZETIA) 10 MG tablet Take 10 mg by mouth daily.      . furosemide (LASIX) 40 MG tablet Take 1/2 to 1 tablet daily as directed  90 tablet  3  . levothyroxine (SYNTHROID, LEVOTHROID) 50 MCG tablet  Take 50 mcg by mouth daily before breakfast.      . metFORMIN (GLUCOPHAGE) 1000 MG tablet Take 1,000 mg by mouth 2 (two) times daily with a meal.      . metoprolol succinate (TOPROL-XL) 100 MG 24 hr tablet Take 100 mg by mouth daily. Take with or immediately following a meal.      . niacin (NIASPAN) 500 MG CR tablet Take 500 mg by mouth at bedtime.      . ramipril (ALTACE) 5 MG capsule Take 5 mg by mouth daily.      . rosuvastatin (CRESTOR) 40 MG tablet Take 1 tablet (40 mg total) by mouth daily.  90 tablet  3  . warfarin (COUMADIN) 5 MG tablet Take 5 mg by mouth daily. Or as directed       No current facility-administered medications for this visit.    LABS/IMAGING: Results for orders placed in visit on 01/19/13 (from the past 48 hour(s))  COMPREHENSIVE METABOLIC PANEL     Status: Abnormal   Collection Time    01/26/13  5:20 AM      Result Value Range   Sodium 139  135 - 145 mEq/L   Potassium 4.4  3.5 - 5.3 mEq/L   Chloride 104  96 - 112 mEq/L   CO2 26  19 - 32 mEq/L   Glucose, Bld 123 (*) 70 - 99 mg/dL   BUN 22  6 - 23 mg/dL   Creat 5.28  4.13 - 2.44 mg/dL   Total Bilirubin 0.6  0.3 - 1.2 mg/dL   Alkaline Phosphatase 40  39 - 117 U/L   AST 22  0 - 37 U/L   ALT 31  0 - 35 U/L   Total Protein 6.5  6.0 - 8.3 g/dL   Albumin 4.7  3.5 - 5.2 g/dL   Calcium 9.2  8.4 - 01.0 mg/dL  NMR LIPOPROFILE WITH LIPIDS     Status: Abnormal   Collection Time    01/26/13  5:20 AM      Result Value Range   LDL Particle Number [id number] (*) <1000 nmol/L   Comment:       Reference Range:     ----------------     Low:                <1000     Moderate:           1000-1299     Borderline-High:    1300-1599     High:               1600-2000     Very High:          >2000  LDL (calc) 55  <100 mg/dL   Comment: LDL-C is inaccurate if patient is nonfasting.           Reference Range:     ----------------     Optimal:            <100     Near/Above Optimal: 100-129     Borderline High:     130-159     High:               160-189     Very High:          >=190               HDL-C 39 (*) >=40 mg/dL   Triglycerides 161  <096 mg/dL   Cholesterol, Total 045  <200 mg/dL   HDL Particle Number 40.9  >=81.1 umol/L   Large HDL-P <1.3 (*) >=4.8 umol/L   Large VLDL-P 2.9 (*) <=2.7 nmol/L   Small LDL Particle Number [id number] (*) <=527 nmol/L   LDL Size 19.7 (*) >20.5 nm   HDL Size 8.8 (*) >=9.2 nm   VLDL Size 51.0 (*) <=46.6 nm   LP-IR Score 66 (*) <=45   Comment:       HDL Particle Number, Large HDL-P, Large VLDL-P, Small LDL Particle     Number, LDL Size, HDL Size, VLDL Size, and LP-IR Score have been     validated and are reported by [employer] but not cleared by     the [place] FDA; the clinical utility of these test results has not been     fully established.         No results found.  VITALS: BP 152/78  Pulse 65  Ht 5\' 3"  (1.6 m)  Wt 173 lb 6.4 oz (78.654 kg)  BMI 30.72 kg/m2  EXAM: General appearance: alert and no distress Neck: no adenopathy, no carotid bruit, no JVD, supple, symmetrical, trachea midline and thyroid not enlarged, symmetric, no tenderness/mass/nodules Lungs: clear to auscultation bilaterally Heart: regular rate and rhythm, S1, S2 normal, no murmur, click, rub or gallop and Sharp mechanical valve sound Abdomen: soft, non-tender; bowel sounds normal; no masses,  no organomegaly Extremities: extremities normal, atraumatic, no cyanosis or edema Pulses: 2+ and symmetric Skin: Skin color, texture, turgor normal. No rashes or lesions Neurologic: Grossly normal  EKG: Sinus rhythm at 65  ASSESSMENT: 1. Coronary artery disease status post 3 vessel CABG (LIMA to LAD, SVG to circumflex, SVG to PDA) 2. Severe aortic stenosis and a bicuspid aortic valve, status post 19 mm St. Jude mechanical aortic valve excellent recent left popliteal artery occlusion with rest pain status post angioplasty 3. Pulmonary emboli associated with DVT 4. Ischemic  cardiomyopathy, EF 40-45% 5. Hypertension 6. Dyslipidemia 7. Diabetes type 2  PLAN: 1.   Mrs. Blatchley is doing fairly well except for bilateral leg pain which is worse in her calves when walking. It does get better when she rests. There are some symptoms that are concerning for claudication, although her pulses are generally well felt and intact. Given her history of arterial thrombus, would like her to undergo repeat arterial Dopplers. If these are normal, I suspect that this is a statin-induced phenomenon. Would also like to check her vitamin D level. This could potentiate statin myopathy and I would recommend repleting it if it is low. Otherwise I'm pleased with her improvement on high-dose Crestor and Zetia. Given her young age and the severity of her coronary disease and other medical  problems, it is imperative that we aggressively treat her cholesterol with a goal to reduce her particle numbers close to or below 1000 as possible.  I will see her back in a year or sooner as necessary.  Chrystie Nose, MD, [facility] Attending Cardiologist [facility]  HILTY,Kenneth C 01/26/2013, 7:21 PM

## 2013-01-27 ENCOUNTER — Other Ambulatory Visit: Payer: Self-pay | Admitting: Internal Medicine

## 2013-01-27 LAB — VITAMIN D 25 HYDROXY (VIT D DEFICIENCY, FRACTURES): Vit D, 25-Hydroxy: 36 ng/mL (ref 30–89)

## 2013-02-01 ENCOUNTER — Telehealth: Payer: Self-pay | Admitting: *Deleted

## 2013-02-01 NOTE — Telephone Encounter (Signed)
Results released to My Chart.  

## 2013-02-11 ENCOUNTER — Ambulatory Visit (HOSPITAL_COMMUNITY)
Admission: RE | Admit: 2013-02-11 | Discharge: 2013-02-11 | Disposition: A | Discharge status: Home / Self Care | Payer: BC Managed Care – PPO | Source: Ambulatory Visit | Attending: Cardiovascular Disease | Admitting: Cardiovascular Disease

## 2013-02-11 DIAGNOSIS — I70219 Atherosclerosis of native arteries of extremities with intermittent claudication, unspecified extremity: Secondary | ICD-10-CM

## 2013-02-11 DIAGNOSIS — I739 Peripheral vascular disease, unspecified: Secondary | ICD-10-CM

## 2013-02-11 NOTE — Progress Notes (Signed)
Lower extremity arterial duplex complete. GMG

## 2013-02-19 ENCOUNTER — Emergency Department (HOSPITAL_COMMUNITY): Payer: BC Managed Care – PPO

## 2013-02-19 ENCOUNTER — Encounter (HOSPITAL_COMMUNITY): Payer: Self-pay | Admitting: Emergency Medicine

## 2013-02-19 ENCOUNTER — Emergency Department (HOSPITAL_COMMUNITY)
Admission: EM | Admit: 2013-02-19 | Discharge: 2013-02-19 | Disposition: A | Discharge status: Home / Self Care | Payer: BC Managed Care – PPO | Attending: Emergency Medicine | Admitting: Emergency Medicine

## 2013-02-19 DIAGNOSIS — Z951 Presence of aortocoronary bypass graft: Secondary | ICD-10-CM | POA: Insufficient documentation

## 2013-02-19 DIAGNOSIS — E119 Type 2 diabetes mellitus without complications: Secondary | ICD-10-CM | POA: Insufficient documentation

## 2013-02-19 DIAGNOSIS — Z79899 Other long term (current) drug therapy: Secondary | ICD-10-CM | POA: Insufficient documentation

## 2013-02-19 DIAGNOSIS — Z7901 Long term (current) use of anticoagulants: Secondary | ICD-10-CM | POA: Insufficient documentation

## 2013-02-19 DIAGNOSIS — M25512 Pain in left shoulder: Secondary | ICD-10-CM

## 2013-02-19 DIAGNOSIS — E785 Hyperlipidemia, unspecified: Secondary | ICD-10-CM | POA: Insufficient documentation

## 2013-02-19 DIAGNOSIS — Z7982 Long term (current) use of aspirin: Secondary | ICD-10-CM | POA: Insufficient documentation

## 2013-02-19 DIAGNOSIS — Z86718 Personal history of other venous thrombosis and embolism: Secondary | ICD-10-CM | POA: Insufficient documentation

## 2013-02-19 DIAGNOSIS — Z8679 Personal history of other diseases of the circulatory system: Secondary | ICD-10-CM | POA: Insufficient documentation

## 2013-02-19 DIAGNOSIS — I2510 Atherosclerotic heart disease of native coronary artery without angina pectoris: Secondary | ICD-10-CM | POA: Insufficient documentation

## 2013-02-19 DIAGNOSIS — I10 Essential (primary) hypertension: Secondary | ICD-10-CM | POA: Insufficient documentation

## 2013-02-19 DIAGNOSIS — M25519 Pain in unspecified shoulder: Secondary | ICD-10-CM | POA: Insufficient documentation

## 2013-02-19 MED ORDER — CYCLOBENZAPRINE HCL 10 MG PO TABS
10.0000 mg | ORAL_TABLET | Freq: Once | ORAL | Status: AC
  Administered 2013-02-19: 10 mg via ORAL
  Filled 2013-02-19: qty 1

## 2013-02-19 MED ORDER — KETOROLAC TROMETHAMINE 60 MG/2ML IM SOLN
60.0000 mg | Freq: Once | INTRAMUSCULAR | Status: AC
  Administered 2013-02-19: 60 mg via INTRAMUSCULAR
  Filled 2013-02-19: qty 2

## 2013-02-19 MED ORDER — OXYCODONE-ACETAMINOPHEN 5-325 MG PO TABS
2.0000 | ORAL_TABLET | Freq: Once | ORAL | Status: AC
  Administered 2013-02-19: 2 via ORAL
  Filled 2013-02-19: qty 2

## 2013-02-19 MED ORDER — CYCLOBENZAPRINE HCL 10 MG PO TABS: 10.0000 mg | ORAL_TABLET | Freq: Two times a day (BID) | ORAL | Status: DC | PRN

## 2013-02-19 MED ORDER — DIAZEPAM 5 MG PO TABS
5.0000 mg | ORAL_TABLET | Freq: Once | ORAL | Status: AC
  Administered 2013-02-19: 5 mg via ORAL
  Filled 2013-02-19: qty 1

## 2013-02-19 MED ORDER — TRAMADOL HCL 50 MG PO TABS: 50.0000 mg | ORAL_TABLET | Freq: Four times a day (QID) | ORAL | Status: DC | PRN

## 2013-02-19 NOTE — ED Notes (Signed)
Pt confused on why she needed a sling immobilizer. Informed pt of the plan of care.

## 2013-02-19 NOTE — ED Notes (Signed)
Per report from [facility] pt was attempting to pull start a lawn mower yesterday.  She reported L shoulder pain during this attempt but now is having L shoulder pain with numbness and tingling in the L arm only.  No distress noted.

## 2013-02-19 NOTE — ED Provider Notes (Signed)
CSN: [id number]     Arrival date & time 02/19/13  0813 History     First MD Initiated Contact with Patient 02/19/13 0815     Chief Complaint  Patient presents with  . Shoulder Pain   (Consider location/radiation/quality/duration/timing/severity/associated sxs/prior Treatment) HPI Comments: Patient is a 57 year old female who presents with left shoulder pain since yesterday. Patient reports pulling a lawn mower yesterday and noticed the shoulder pain shortly after doing that. The pain is localized to her left shoulder and radiates down her arm. The pain is aching and severe. She tried tylenol for pain without relief. She reports associated tingling. Movement and palpation of the shoulder makes the pain worse. Nothing makes the pain better.    Past Medical History  Diagnosis Date  . Diabetes mellitus without complication   . Coronary artery disease     s/p 3- vessel bypass   . Ischemic cardiomyopathy     40 to 45%  . Hyperlipidemia   . Hypertension   . Pulmonary embolism     associated with DVT  . Aortic valve stenosis, severe     and bbicusipid aortic valve status post 19-mm St Jude mechanical aortic valve replacement  . Popliteal artery occlusion, left     recent  status  post angioplasty  . Heart murmur    Past Surgical History  Procedure Laterality Date  . Asd repair    . Aortic valve replacement (avr)/coronary artery bypass grafting (cabg)  08/16/2010  VAN TRIGT    AV replacement w/19-mm mechanical St Jude valve ([id number]),CABGX3  lima to LAD, SVG to distal circ,SVG to posterior descending  . Coronary artery bypass graft  08/16/2010    LIMA TO lad,svg to distal circ ,svg to posterior descending  . Doppler echocardiography  08/09/2010    EF35% to 40% ,bbicuspid ;severely  thickened ,severely calcified leaflets, LV normal  . Nuc    . Nm myocar perf wall motion  08/06/2010    EF 31%  LOW RISK SCAN  . Cardiac catheterization  08/08/2010    ,LAD 50% PROX,605 TANDEM SEGMENTAL  STENOSIS,LEFT CIRC 60%-AV GROOVE, RGT COR 40% PROX ON THE BEND ,80% PROX PDA,  . Lea doppler  08/07/2010    left popliteal occlusion ,and anterior tibial complete occlusion  . Pv angiogram  08/08/2010    successful PTA LEFT POPLITEAL   Family History  Problem Relation Age of Onset  . Cancer Mother   . Cancer Father   . Stroke Brother   . Heart attack Brother   . Hypertension Brother    History  Substance Use Topics  . Smoking status: Never Smoker   . Smokeless tobacco: Not on file  . Alcohol Use: Yes   OB History   Grav Para Term Preterm Abortions TAB SAB Ect Mult Living                 Review of Systems  Musculoskeletal: Positive for arthralgias.  All other systems reviewed and are negative.    Allergies  Review of patient's allergies indicates no known allergies.  Home Medications   Current Outpatient Rx  Name  Route  Sig  Dispense  Refill  . acetaminophen (TYLENOL) 500 MG tablet   Oral   Take 2,000 mg by mouth every 6 (six) hours as needed for pain.         Marland Kitchen aspirin EC 81 MG tablet   Oral   Take 81 mg by mouth daily.         Marland Kitchen  Canagliflozin (INVOKANA) 100 MG TABS   Oral   Take 100 mg by mouth daily.         Marland Kitchen ezetimibe (ZETIA) 10 MG tablet   Oral   Take 10 mg by mouth daily.         . furosemide (LASIX) 20 MG tablet   Oral   Take 20 mg by mouth daily.         Marland Kitchen levothyroxine (SYNTHROID, LEVOTHROID) 50 MCG tablet   Oral   Take 50 mcg by mouth daily before breakfast.         . metFORMIN (GLUMETZA) 500 MG (MOD) 24 hr tablet   Oral   Take 1,000 mg by mouth 2 (two) times daily with a meal.         . metoprolol succinate (TOPROL-XL) 100 MG 24 hr tablet   Oral   Take 100 mg by mouth daily. Take with or immediately following a meal.         . niacin (NIASPAN) 500 MG CR tablet   Oral   Take 500 mg by mouth at bedtime.         . ramipril (ALTACE) 5 MG capsule   Oral   Take 5 mg by mouth daily.         . rosuvastatin (CRESTOR)  40 MG tablet   Oral   Take 1 tablet (40 mg total) by mouth daily.   90 tablet   3   . warfarin (COUMADIN) 2 MG tablet   Oral   Take 1-2 mg by mouth daily. Take 1mg  on Mon and Fri, take 2mg  every other day          BP 159/79  Pulse 82  Temp(Src) 97.3 F (36.3 C) (Oral)  Resp 18  SpO2 99% Physical Exam  Nursing note and vitals reviewed. Constitutional: She is oriented to person, place, and time. She appears well-developed and well-nourished. No distress.  HENT:  Head: Normocephalic and atraumatic.  Eyes: Conjunctivae are normal.  Neck: Normal range of motion.  Cardiovascular: Normal rate and regular rhythm.  Exam reveals no gallop and no friction rub.   No murmur heard. Pulmonary/Chest: Effort normal and breath sounds normal. She has no wheezes. She has no rales. She exhibits no tenderness.  Musculoskeletal:  Left shoulder ROM limited due to pain. No obvious deformity. Generalized tenderness to palpation.   Neurological: She is alert and oriented to person, place, and time. Coordination normal.  Grip strength and upper extremity sensation equal and intact bilaterally. Speech is goal-oriented. Moves limbs without ataxia.   Skin: Skin is warm and dry.  Psychiatric: She has a normal mood and affect. Her behavior is normal.    ED Course   Procedures (including critical care time)  Labs Reviewed - No data to display Dg Shoulder Left  02/19/2013   *RADIOLOGY REPORT*  Clinical Data: Shoulder pain extending into the arm.  LEFT SHOULDER - 2+ VIEW  Comparison: None.  Findings: The mineralization and alignment are normal.  There is no evidence of acute fracture or dislocation.  The subacromial space is preserved.  There are mild acromioclavicular degenerative changes.  IMPRESSION: No acute osseous findings.  Mild acromioclavicular degenerative changes.   Original Report Authenticated By: Carey Bullocks, M.D.   1. Left shoulder pain     MDM  9:10 AM Patient will have IM toradol  and PO valium for pain. Shoulder xray pending.   9:58 AM Xray shoulder shows AC degenerative changes without acute changes.  Patient will have PO Percocet for more pain relief. Patient will have sling immobilizer for comfort. Patient will be discharged with pain medication and instructions to follow up with her doctor.   Emilia Beck, PA-C 02/19/13 1015

## 2013-02-19 NOTE — ED Notes (Signed)
Pt stated that she begin having left shoulder pain last night. She stated that when she woke up this morning, she had numbness and tingling down her left arm. Pt thinks that the pain came after she was mowing the lawn yesterday evening. She is alert and oriented. No cardiac or respiratory distress.CNS intact. Pain level currently 8. Will continue to monitor.

## 2013-02-23 NOTE — ED Provider Notes (Signed)
Medical screening examination/treatment/procedure(s) were performed by non-physician practitioner and as supervising physician I was immediately available for consultation/collaboration.  Candyce Churn, MD 02/23/13 1043

## 2013-03-09 ENCOUNTER — Ambulatory Visit: Payer: BC Managed Care – PPO | Admitting: Pharmacist Clinician (PhC)/ Clinical Pharmacy Specialist

## 2013-03-10 ENCOUNTER — Ambulatory Visit (INDEPENDENT_AMBULATORY_CARE_PROVIDER_SITE_OTHER): Payer: BC Managed Care – PPO | Admitting: Pharmacist Clinician (PhC)/ Clinical Pharmacy Specialist

## 2013-03-10 VITALS — BP 110/62 | HR 76

## 2013-03-10 DIAGNOSIS — Z7901 Long term (current) use of anticoagulants: Secondary | ICD-10-CM

## 2013-03-10 DIAGNOSIS — Z954 Presence of other heart-valve replacement: Secondary | ICD-10-CM

## 2013-03-10 DIAGNOSIS — Z952 Presence of prosthetic heart valve: Secondary | ICD-10-CM

## 2013-03-25 ENCOUNTER — Other Ambulatory Visit: Payer: Self-pay | Admitting: Orthopedic Surgery

## 2013-03-25 DIAGNOSIS — M542 Cervicalgia: Secondary | ICD-10-CM

## 2013-04-01 ENCOUNTER — Ambulatory Visit
Admission: RE | Admit: 2013-04-01 | Discharge: 2013-04-01 | Disposition: A | Discharge status: Home / Self Care | Payer: BC Managed Care – PPO | Source: Ambulatory Visit | Attending: Orthopedic Surgery | Admitting: Orthopedic Surgery

## 2013-04-01 DIAGNOSIS — M542 Cervicalgia: Secondary | ICD-10-CM

## 2013-04-21 ENCOUNTER — Ambulatory Visit (INDEPENDENT_AMBULATORY_CARE_PROVIDER_SITE_OTHER): Payer: BC Managed Care – PPO | Admitting: Pharmacist Clinician (PhC)/ Clinical Pharmacy Specialist

## 2013-04-21 VITALS — BP 150/72 | HR 88

## 2013-04-21 DIAGNOSIS — Z952 Presence of prosthetic heart valve: Secondary | ICD-10-CM

## 2013-04-21 DIAGNOSIS — Z7901 Long term (current) use of anticoagulants: Secondary | ICD-10-CM

## 2013-04-21 DIAGNOSIS — Z954 Presence of other heart-valve replacement: Secondary | ICD-10-CM

## 2013-05-09 ENCOUNTER — Encounter: Payer: Self-pay | Admitting: Internal Medicine

## 2013-05-10 ENCOUNTER — Ambulatory Visit (INDEPENDENT_AMBULATORY_CARE_PROVIDER_SITE_OTHER): Payer: BC Managed Care – PPO | Admitting: Pharmacist Clinician (PhC)/ Clinical Pharmacy Specialist

## 2013-05-10 VITALS — BP 128/64 | HR 64

## 2013-05-10 DIAGNOSIS — Z954 Presence of other heart-valve replacement: Secondary | ICD-10-CM

## 2013-05-10 DIAGNOSIS — Z7901 Long term (current) use of anticoagulants: Secondary | ICD-10-CM

## 2013-05-10 DIAGNOSIS — Z952 Presence of prosthetic heart valve: Secondary | ICD-10-CM

## 2013-05-10 LAB — POCT INR: INR: 2.9

## 2013-06-02 ENCOUNTER — Other Ambulatory Visit: Payer: Self-pay

## 2013-06-07 ENCOUNTER — Ambulatory Visit (INDEPENDENT_AMBULATORY_CARE_PROVIDER_SITE_OTHER): Payer: BC Managed Care – PPO | Admitting: Pharmacist Clinician (PhC)/ Clinical Pharmacy Specialist

## 2013-06-07 VITALS — BP 118/70 | HR 64

## 2013-06-07 DIAGNOSIS — Z954 Presence of other heart-valve replacement: Secondary | ICD-10-CM

## 2013-06-07 DIAGNOSIS — Z952 Presence of prosthetic heart valve: Secondary | ICD-10-CM

## 2013-06-07 DIAGNOSIS — Z7901 Long term (current) use of anticoagulants: Secondary | ICD-10-CM

## 2013-06-21 ENCOUNTER — Ambulatory Visit (INDEPENDENT_AMBULATORY_CARE_PROVIDER_SITE_OTHER): Payer: BC Managed Care – PPO | Admitting: Pharmacist Clinician (PhC)/ Clinical Pharmacy Specialist

## 2013-06-21 VITALS — BP 120/68 | HR 80

## 2013-06-21 DIAGNOSIS — Z954 Presence of other heart-valve replacement: Secondary | ICD-10-CM

## 2013-06-21 DIAGNOSIS — Z952 Presence of prosthetic heart valve: Secondary | ICD-10-CM

## 2013-06-21 DIAGNOSIS — Z7901 Long term (current) use of anticoagulants: Secondary | ICD-10-CM

## 2013-07-12 ENCOUNTER — Ambulatory Visit (INDEPENDENT_AMBULATORY_CARE_PROVIDER_SITE_OTHER): Payer: BC Managed Care – PPO | Admitting: Pharmacist Clinician (PhC)/ Clinical Pharmacy Specialist

## 2013-07-12 VITALS — BP 100/64 | HR 76

## 2013-07-12 DIAGNOSIS — Z7901 Long term (current) use of anticoagulants: Secondary | ICD-10-CM

## 2013-07-12 DIAGNOSIS — Z954 Presence of other heart-valve replacement: Secondary | ICD-10-CM

## 2013-07-12 DIAGNOSIS — Z952 Presence of prosthetic heart valve: Secondary | ICD-10-CM

## 2013-07-12 LAB — POCT INR: INR: 2.4

## 2013-07-25 ENCOUNTER — Telehealth: Payer: Self-pay | Admitting: Internal Medicine

## 2013-07-25 ENCOUNTER — Other Ambulatory Visit: Payer: Self-pay | Admitting: Pharmacist Clinician (PhC)/ Clinical Pharmacy Specialist

## 2013-07-25 MED ORDER — WARFARIN SODIUM 2 MG PO TABS: ORAL_TABLET | ORAL | Status: DC

## 2013-07-25 NOTE — Telephone Encounter (Signed)
Needs a new prescription sent to [facility] on [address] for her Warfrain .Marland Kitchen They do not have a  Prescription for that and she is asking for a 90 day supply .Marland Kitchen   Thanks

## 2013-08-02 ENCOUNTER — Ambulatory Visit: Payer: BC Managed Care – PPO | Admitting: Pharmacist Clinician (PhC)/ Clinical Pharmacy Specialist

## 2013-08-03 ENCOUNTER — Ambulatory Visit (INDEPENDENT_AMBULATORY_CARE_PROVIDER_SITE_OTHER): Payer: BC Managed Care – PPO | Admitting: Pharmacist Clinician (PhC)/ Clinical Pharmacy Specialist

## 2013-08-03 VITALS — BP 120/64 | HR 84

## 2013-08-03 DIAGNOSIS — Z954 Presence of other heart-valve replacement: Secondary | ICD-10-CM

## 2013-08-03 DIAGNOSIS — Z7901 Long term (current) use of anticoagulants: Secondary | ICD-10-CM

## 2013-08-03 DIAGNOSIS — Z952 Presence of prosthetic heart valve: Secondary | ICD-10-CM

## 2013-08-03 LAB — POCT INR: INR: 2.8

## 2013-09-01 ENCOUNTER — Ambulatory Visit (INDEPENDENT_AMBULATORY_CARE_PROVIDER_SITE_OTHER): Payer: BC Managed Care – PPO | Admitting: Pharmacist Clinician (PhC)/ Clinical Pharmacy Specialist

## 2013-09-01 VITALS — BP 122/60 | HR 80

## 2013-09-01 DIAGNOSIS — Z954 Presence of other heart-valve replacement: Secondary | ICD-10-CM

## 2013-09-01 DIAGNOSIS — Z952 Presence of prosthetic heart valve: Secondary | ICD-10-CM

## 2013-09-01 DIAGNOSIS — Z7901 Long term (current) use of anticoagulants: Secondary | ICD-10-CM

## 2013-09-01 LAB — POCT INR: INR: 2.2

## 2013-09-02 ENCOUNTER — Encounter (HOSPITAL_COMMUNITY): Payer: BC Managed Care – PPO

## 2013-09-27 ENCOUNTER — Ambulatory Visit (INDEPENDENT_AMBULATORY_CARE_PROVIDER_SITE_OTHER): Payer: BC Managed Care – PPO | Admitting: Internal Medicine

## 2013-09-27 ENCOUNTER — Ambulatory Visit (INDEPENDENT_AMBULATORY_CARE_PROVIDER_SITE_OTHER): Payer: BC Managed Care – PPO | Admitting: Pharmacist Clinician (PhC)/ Clinical Pharmacy Specialist

## 2013-09-27 ENCOUNTER — Encounter: Payer: Self-pay | Admitting: Internal Medicine

## 2013-09-27 VITALS — BP 152/86 | HR 75 | Ht 63.0 in | Wt 170.8 lb

## 2013-09-27 DIAGNOSIS — I2699 Other pulmonary embolism without acute cor pulmonale: Secondary | ICD-10-CM

## 2013-09-27 DIAGNOSIS — Z952 Presence of prosthetic heart valve: Secondary | ICD-10-CM

## 2013-09-27 DIAGNOSIS — Z951 Presence of aortocoronary bypass graft: Secondary | ICD-10-CM

## 2013-09-27 DIAGNOSIS — Z954 Presence of other heart-valve replacement: Secondary | ICD-10-CM

## 2013-09-27 DIAGNOSIS — E785 Hyperlipidemia, unspecified: Secondary | ICD-10-CM

## 2013-09-27 DIAGNOSIS — I255 Ischemic cardiomyopathy: Secondary | ICD-10-CM

## 2013-09-27 DIAGNOSIS — Z7901 Long term (current) use of anticoagulants: Secondary | ICD-10-CM

## 2013-09-27 DIAGNOSIS — I82409 Acute embolism and thrombosis of unspecified deep veins of unspecified lower extremity: Secondary | ICD-10-CM

## 2013-09-27 DIAGNOSIS — I2589 Other forms of chronic ischemic heart disease: Secondary | ICD-10-CM

## 2013-09-27 DIAGNOSIS — E119 Type 2 diabetes mellitus without complications: Secondary | ICD-10-CM

## 2013-09-27 DIAGNOSIS — I10 Essential (primary) hypertension: Secondary | ICD-10-CM

## 2013-09-27 LAB — NMR LIPOPROFILE WITH LIPIDS
Cholesterol, Total: 167 mg/dL (ref <200)
HDL PARTICLE NUMBER: 44 umol/L (ref >=30.5)
HDL SIZE: 8.7 nm — AB (ref >=9.2)
HDL-C: 50 mg/dL (ref >=40)
LARGE HDL: 2.6 umol/L — AB (ref >=4.8)
LDL CALC: 89 mg/dL (ref <100)
LDL Particle Number: 2139 nmol/L — ABNORMAL HIGH (ref <1000)
LDL SIZE: 19.9 nm — AB (ref >20.5)
LP-IR SCORE: 69 — AB (ref <=45)
Large VLDL-P: 3.7 nmol/L — ABNORMAL HIGH (ref <=2.7)
Small LDL Particle Number: 1564 nmol/L — ABNORMAL HIGH (ref <=527)
TRIGLYCERIDES: 140 mg/dL (ref <150)
VLDL Size: 48 nm — ABNORMAL HIGH (ref <=46.6)

## 2013-09-27 LAB — POCT INR: INR: 3.7

## 2013-09-27 NOTE — Progress Notes (Signed)
Chief Complaint:  Routine followup  Primary Care Physician: Delia Chimes, NP  HPI:  Katrina Obrien is a 58 year old female who has a history of an unfortunate episode with left popliteal artery thrombus, PE, severe aortic stenosis which was ultimately diagnosed in a bicuspid aortic valve, as well as multivessel coronary disease, anterior MI, EF about 40% to 45% - all which came to a head about the same time. She underwent 3-vessel bypass as well as mechanical aortic valve replacement. She has done well on Coumadin and was ultimately diagnosed with diabetes and is followed by Dr. Chalmers Cater for this. Now she is markedly improved, although she has an ischemic cardiomyopathy with an EF of 40% to 45%. Her activity level is pretty good.  We recently performed repeat lipid testing, which demonstrated an elevated LDL particle number of 2163. The calculated LDL was 80. I asked her to increase her Crestor to 40 mg at night and add 500 mg Niaspan. She reports significant flushing and intolerance to the Niaspan, despite taking aspirin 30 minutes prior.  Her laboratory work does look improved, with her particle number being reduced to 1547, LDL 55, HDL of 39.  She does report however worsening pain in her capsule when she walks that works its way up her legs. This is nothing as significant as when she was originally diagnosed with her acute arterial thrombus, but has concerns for claudication. Alternatively, this could represent myalgias from her statin.  She reports virtually no change in the thigh discomfort that she has. Lower extremity Dopplers were unrevealing.  She denies any further chest pain or worsening shortness of breath. She is less active than she was and reports that she may not be eating as healthy as she had been.  PMHx:  Past Medical History  Diagnosis Date  . Diabetes mellitus without complication   . Coronary artery disease     s/p 3- vessel bypass   . Ischemic cardiomyopathy     40 to  45%  . Hyperlipidemia   . Hypertension   . Pulmonary embolism     associated with DVT  . Aortic valve stenosis, severe     and bbicusipid aortic valve status post 19-mm St Jude mechanical aortic valve replacement  . Popliteal artery occlusion, left     recent  status  post angioplasty  . Heart murmur     Past Surgical History  Procedure Laterality Date  . Asd repair    . Aortic valve replacement (avr)/coronary artery bypass grafting (cabg)  08/16/2010  VAN TRIGT    AV replacement w/19-mm mechanical St Jude valve ([id number]),CABGX3  lima to LAD, SVG to distal circ,SVG to posterior descending  . Coronary artery bypass graft  08/16/2010    LIMA TO lad,svg to distal circ ,svg to posterior descending  . Doppler echocardiography  08/09/2010    EF35% to 40% ,bbicuspid ;severely  thickened ,severely calcified leaflets, LV normal  . Nuc    . Nm myocar perf wall motion  08/06/2010    EF 31%  LOW RISK SCAN  . Cardiac catheterization  08/08/2010    ,LAD 50% PROX,605 TANDEM SEGMENTAL STENOSIS,LEFT CIRC 60%-AV GROOVE, RGT COR 40% PROX ON THE BEND ,80% PROX PDA,  . Lea doppler  08/07/2010    left popliteal occlusion ,and anterior tibial complete occlusion  . Pv angiogram  08/08/2010    successful PTA LEFT POPLITEAL    FAMHx:  Family History  Problem Relation Age of Onset  . Cancer Mother   .  Cancer Father   . Stroke Brother   . Heart attack Brother   . Hypertension Brother     SOCHx:   reports that she has never smoked. She has never used smokeless tobacco. She reports that she drinks alcohol. She reports that she does not use illicit drugs.  ALLERGIES:  No Known Allergies  ROS: A comprehensive review of systems was negative except for: Musculoskeletal: positive for myalgias  HOME MEDS: Current Outpatient Prescriptions  Medication Sig Dispense Refill  . acetaminophen (TYLENOL) 500 MG tablet Take 2,000 mg by mouth every 6 (six) hours as needed for pain.      Marland Kitchen aspirin EC 81 MG  tablet Take 81 mg by mouth daily.      . Canagliflozin (INVOKANA) 100 MG TABS Take 100 mg by mouth daily.      Marland Kitchen ezetimibe (ZETIA) 10 MG tablet Take 10 mg by mouth daily.      . furosemide (LASIX) 20 MG tablet Take 20 mg by mouth daily.      Marland Kitchen levothyroxine (SYNTHROID, LEVOTHROID) 50 MCG tablet Take 50 mcg by mouth daily before breakfast.      . metFORMIN (GLUMETZA) 500 MG (MOD) 24 hr tablet Take 1,000 mg by mouth 2 (two) times daily with a meal.      . metoprolol succinate (TOPROL-XL) 100 MG 24 hr tablet Take 100 mg by mouth daily. Take with or immediately following a meal.      . niacin (NIASPAN) 500 MG CR tablet Take 500 mg by mouth at bedtime.      . ramipril (ALTACE) 5 MG capsule Take 5 mg by mouth daily.      . rosuvastatin (CRESTOR) 40 MG tablet Take 1 tablet (40 mg total) by mouth daily.  90 tablet  3  . traMADol (ULTRAM) 50 MG tablet Take 1 tablet (50 mg total) by mouth every 6 (six) hours as needed for pain.  15 tablet  0  . warfarin (COUMADIN) 2 MG tablet Take 1 to 1 &1/2 tablets by mouth daily as directed  120 tablet  1   No current facility-administered medications for this visit.    LABS/IMAGING: Results for orders placed in visit on 09/27/13 (from the past 48 hour(s))  NMR LIPOPROFILE WITH LIPIDS     Status: Abnormal   Collection Time    09/27/13  9:52 AM      Result Value Ref Range   LDL Particle Number [id number] (*) <1000 nmol/L   Comment:       Reference Range:     ----------------     Low:                <1000     Moderate:           1000-1299     Borderline-High:    1300-1599     High:               1600-2000     Very High:          >2000               LDL (calc) 89  <100 mg/dL   Comment: LDL-C is inaccurate if patient is nonfasting.           Reference Range:     ----------------     Optimal:            <100     Near/Above Optimal: 100-129     Borderline High:  130-159     High:               160-189     Very High:          >=190               HDL-C 50   >=40 mg/dL   Triglycerides 140  <150 mg/dL   Cholesterol, Total 167  <200 mg/dL   HDL Particle Number [id number]  >=30.5 umol/L   Large HDL-P 2.6 (*) >=4.8 umol/L   Large VLDL-P 3.7 (*) <=2.7 nmol/L   Small LDL Particle Number [id number] (*) <=527 nmol/L   LDL Size 19.9 (*) >20.5 nm   HDL Size 8.7 (*) >=9.2 nm   VLDL Size 48.0 (*) <=46.6 nm   LP-IR Score 69 (*) <=45   Comment:       HDL Particle Number, Large HDL-P, Large VLDL-P, Small LDL Particle     Number, LDL Size, HDL Size, VLDL Size, and LP-IR Score have been     validated and are reported by [employer] but not cleared by     the [place] FDA; the clinical utility of these test results has not been     fully established.         No results found.  VITALS: BP 152/86  Pulse 75  Ht 5\' 3"  (1.6 m)  Wt 170 lb 12.8 oz (77.474 kg)  BMI 30.26 kg/m2  EXAM: General appearance: alert and no distress Neck: no adenopathy, no carotid bruit, no JVD, supple, symmetrical, trachea midline and thyroid not enlarged, symmetric, no tenderness/mass/nodules Lungs: clear to auscultation bilaterally Heart: regular rate and rhythm, S1, S2 normal, no murmur, click, rub or gallop and Sharp mechanical valve sound Abdomen: soft, non-tender; bowel sounds normal; no masses,  no organomegaly Extremities: extremities normal, atraumatic, no cyanosis or edema Pulses: 2+ and symmetric Skin: Skin color, texture, turgor normal. No rashes or lesions Neurologic: Grossly normal  EKG: Sinus rhythm at 65  ASSESSMENT: 1. Coronary artery disease status post 3 vessel CABG (LIMA to LAD, SVG to circumflex, SVG to PDA) 2. Severe aortic stenosis and a bicuspid aortic valve, status post 19 mm St. Jude mechanical aortic valve excellent recent left popliteal artery occlusion with rest pain status post angioplasty 3. Pulmonary emboli associated with DVT 4. Ischemic cardiomyopathy, EF 40-45% 5. Hypertension 6. Dyslipidemia 7. Diabetes type 2  PLAN: 1.   Mrs. Snooks is doing  well .  She did have a repeat lipid profile today which shows slightly higher particle number an elevated LDL cholesterol. I've asked her to work on her diet for the time being and we'll not further increase her medications. Also asked her to increase her exercise. Her last echocardiogram was at the time of her valve replacement which is now 3 years ago. I would recommend a repeat echocardiogram to reevaluate the gradient across the aortic valve and assess for any improvement in her EF which was 40-45%. Plan to see her back in 6 months or sooner as necessary.  Pixie Casino, MD, [facility] Attending Cardiologist [facility] [place] [facility] 09/27/2013, 5:23 PM

## 2013-09-27 NOTE — Patient Instructions (Signed)
Your physician has requested that you have an echocardiogram. Echocardiography is a painless test that uses sound waves to create images of your heart. It provides your doctor with information about the size and shape of your heart and how well your heart's chambers and valves are working. This procedure takes approximately one hour. There are no restrictions for this procedure.  Your physician recommends that you return for lab work at your earliest convenience. You will need to be fasting - nothing to eat/drink after midnight.   Your physician wants you to follow-up in: 6 months. You will receive a reminder letter in the mail two months in advance. If you don't receive a letter, please call our office to schedule the follow-up appointment.

## 2013-10-24 ENCOUNTER — Ambulatory Visit (HOSPITAL_COMMUNITY)
Admission: RE | Admit: 2013-10-24 | Discharge: 2013-10-24 | Disposition: A | Discharge status: Home / Self Care | Payer: BC Managed Care – PPO | Source: Ambulatory Visit | Attending: Cardiovascular Disease | Admitting: Cardiovascular Disease

## 2013-10-24 ENCOUNTER — Ambulatory Visit (INDEPENDENT_AMBULATORY_CARE_PROVIDER_SITE_OTHER): Payer: BC Managed Care – PPO | Admitting: Pharmacist Clinician (PhC)/ Clinical Pharmacy Specialist

## 2013-10-24 VITALS — BP 138/70 | HR 68

## 2013-10-24 DIAGNOSIS — I369 Nonrheumatic tricuspid valve disorder, unspecified: Secondary | ICD-10-CM

## 2013-10-24 DIAGNOSIS — I359 Nonrheumatic aortic valve disorder, unspecified: Secondary | ICD-10-CM | POA: Insufficient documentation

## 2013-10-24 DIAGNOSIS — Z952 Presence of prosthetic heart valve: Secondary | ICD-10-CM

## 2013-10-24 DIAGNOSIS — Z7901 Long term (current) use of anticoagulants: Secondary | ICD-10-CM

## 2013-10-24 DIAGNOSIS — Z954 Presence of other heart-valve replacement: Secondary | ICD-10-CM

## 2013-10-24 LAB — POCT INR: INR: 3.2

## 2013-10-24 NOTE — Progress Notes (Signed)
  Echocardiogram 2D Echocardiogram has been performed.  Jarrett Albor, [place] 10/24/2013, 2:45 PM

## 2013-11-04 ENCOUNTER — Telehealth: Payer: Self-pay | Admitting: *Deleted

## 2013-11-04 NOTE — Telephone Encounter (Signed)
Patient notified of echo results. (Echo looks great - prosthetic valve gradient is normal. LV function is back to normal)

## 2013-11-21 ENCOUNTER — Ambulatory Visit (INDEPENDENT_AMBULATORY_CARE_PROVIDER_SITE_OTHER): Payer: BC Managed Care – PPO | Admitting: Pharmacist Clinician (PhC)/ Clinical Pharmacy Specialist

## 2013-11-21 DIAGNOSIS — Z954 Presence of other heart-valve replacement: Secondary | ICD-10-CM

## 2013-11-21 DIAGNOSIS — Z7901 Long term (current) use of anticoagulants: Secondary | ICD-10-CM

## 2013-11-21 DIAGNOSIS — Z952 Presence of prosthetic heart valve: Secondary | ICD-10-CM

## 2013-11-21 LAB — POCT INR: INR: 3.7

## 2013-12-08 ENCOUNTER — Other Ambulatory Visit: Payer: Self-pay | Admitting: Internal Medicine

## 2013-12-08 NOTE — Telephone Encounter (Signed)
Rx was sent to pharmacy electronically. 

## 2013-12-17 ENCOUNTER — Other Ambulatory Visit: Payer: Self-pay | Admitting: Internal Medicine

## 2013-12-17 NOTE — Telephone Encounter (Signed)
Rx was sent to pharmacy electronically. 

## 2013-12-20 ENCOUNTER — Other Ambulatory Visit: Payer: Self-pay

## 2013-12-20 MED ORDER — ROSUVASTATIN CALCIUM 40 MG PO TABS: 40.0000 mg | ORAL_TABLET | Freq: Every day | ORAL | Status: DC

## 2013-12-20 NOTE — Telephone Encounter (Signed)
Rx was sent to pharmacy electronically. 

## 2013-12-21 ENCOUNTER — Ambulatory Visit (INDEPENDENT_AMBULATORY_CARE_PROVIDER_SITE_OTHER): Payer: BC Managed Care – PPO | Admitting: Pharmacist Clinician (PhC)/ Clinical Pharmacy Specialist

## 2013-12-21 ENCOUNTER — Ambulatory Visit: Payer: BC Managed Care – PPO | Admitting: Pharmacist Clinician (PhC)/ Clinical Pharmacy Specialist

## 2013-12-21 DIAGNOSIS — Z7901 Long term (current) use of anticoagulants: Secondary | ICD-10-CM

## 2013-12-21 DIAGNOSIS — Z954 Presence of other heart-valve replacement: Secondary | ICD-10-CM

## 2013-12-21 DIAGNOSIS — Z952 Presence of prosthetic heart valve: Secondary | ICD-10-CM

## 2013-12-21 LAB — POCT INR: INR: 3.8

## 2013-12-21 MED ORDER — ROSUVASTATIN CALCIUM 40 MG PO TABS: 40.0000 mg | ORAL_TABLET | Freq: Every day | ORAL | Status: DC

## 2013-12-21 MED ORDER — EZETIMIBE 10 MG PO TABS: 10.0000 mg | ORAL_TABLET | Freq: Every day | ORAL | Status: DC

## 2014-01-09 ENCOUNTER — Ambulatory Visit (INDEPENDENT_AMBULATORY_CARE_PROVIDER_SITE_OTHER): Payer: BC Managed Care – PPO | Admitting: Pharmacist Clinician (PhC)/ Clinical Pharmacy Specialist

## 2014-01-09 DIAGNOSIS — Z7901 Long term (current) use of anticoagulants: Secondary | ICD-10-CM

## 2014-01-09 DIAGNOSIS — Z954 Presence of other heart-valve replacement: Secondary | ICD-10-CM

## 2014-01-09 DIAGNOSIS — Z952 Presence of prosthetic heart valve: Secondary | ICD-10-CM

## 2014-01-09 LAB — POCT INR: INR: 4.1

## 2014-01-23 ENCOUNTER — Ambulatory Visit (INDEPENDENT_AMBULATORY_CARE_PROVIDER_SITE_OTHER): Payer: BC Managed Care – PPO | Admitting: Pharmacist Clinician (PhC)/ Clinical Pharmacy Specialist

## 2014-01-23 DIAGNOSIS — Z954 Presence of other heart-valve replacement: Secondary | ICD-10-CM

## 2014-01-23 DIAGNOSIS — Z952 Presence of prosthetic heart valve: Secondary | ICD-10-CM

## 2014-01-23 DIAGNOSIS — Z7901 Long term (current) use of anticoagulants: Secondary | ICD-10-CM

## 2014-01-23 LAB — POCT INR: INR: 2.4

## 2014-01-30 ENCOUNTER — Telehealth (HOSPITAL_COMMUNITY): Payer: Self-pay | Admitting: *Deleted

## 2014-02-13 ENCOUNTER — Other Ambulatory Visit: Payer: Self-pay | Admitting: Internal Medicine

## 2014-02-13 MED ORDER — FUROSEMIDE 20 MG PO TABS: 20.0000 mg | ORAL_TABLET | Freq: Every day | ORAL | Status: DC

## 2014-02-20 ENCOUNTER — Ambulatory Visit (INDEPENDENT_AMBULATORY_CARE_PROVIDER_SITE_OTHER): Payer: BC Managed Care – PPO | Admitting: Pharmacist Clinician (PhC)/ Clinical Pharmacy Specialist

## 2014-02-20 DIAGNOSIS — Z7901 Long term (current) use of anticoagulants: Secondary | ICD-10-CM

## 2014-02-20 DIAGNOSIS — Z954 Presence of other heart-valve replacement: Secondary | ICD-10-CM

## 2014-02-20 DIAGNOSIS — Z952 Presence of prosthetic heart valve: Secondary | ICD-10-CM

## 2014-02-20 LAB — POCT INR: INR: 3.1

## 2014-02-27 ENCOUNTER — Other Ambulatory Visit: Payer: Self-pay | Admitting: Pharmacist Clinician (PhC)/ Clinical Pharmacy Specialist

## 2014-02-28 ENCOUNTER — Telehealth: Payer: Self-pay | Admitting: Internal Medicine

## 2014-02-28 MED ORDER — FUROSEMIDE 40 MG PO TABS: 40.0000 mg | ORAL_TABLET | Freq: Every day | ORAL | Status: DC

## 2014-02-28 NOTE — Telephone Encounter (Signed)
Patient states that she picked up her refill for her Fuosemide and it was for 20 mg daily ---30 day supply.  Patient states that she takes 40 mg daily and usually gets a 90 day supply.  Please call [facility] on [address] and let her know when this has been corrected.

## 2014-02-28 NOTE — Telephone Encounter (Signed)
Rx refill clarified and refill sent to pharmacy

## 2014-03-20 ENCOUNTER — Ambulatory Visit (HOSPITAL_COMMUNITY)
Admission: RE | Admit: 2014-03-20 | Discharge: 2014-03-20 | Disposition: A | Discharge status: Home / Self Care | Payer: BC Managed Care – PPO | Source: Ambulatory Visit | Attending: Cardiology | Admitting: Cardiology

## 2014-03-20 ENCOUNTER — Ambulatory Visit (INDEPENDENT_AMBULATORY_CARE_PROVIDER_SITE_OTHER): Payer: BC Managed Care – PPO | Admitting: Pharmacist Clinician (PhC)/ Clinical Pharmacy Specialist

## 2014-03-20 ENCOUNTER — Other Ambulatory Visit (HOSPITAL_COMMUNITY): Payer: Self-pay | Admitting: Internal Medicine

## 2014-03-20 DIAGNOSIS — I70219 Atherosclerosis of native arteries of extremities with intermittent claudication, unspecified extremity: Secondary | ICD-10-CM | POA: Diagnosis not present

## 2014-03-20 DIAGNOSIS — I739 Peripheral vascular disease, unspecified: Secondary | ICD-10-CM

## 2014-03-20 DIAGNOSIS — Z952 Presence of prosthetic heart valve: Secondary | ICD-10-CM

## 2014-03-20 DIAGNOSIS — Z954 Presence of other heart-valve replacement: Secondary | ICD-10-CM

## 2014-03-20 DIAGNOSIS — Z7901 Long term (current) use of anticoagulants: Secondary | ICD-10-CM

## 2014-03-20 LAB — POCT INR: INR: 4

## 2014-03-20 NOTE — Progress Notes (Signed)
Arterial Duplex Lower Ext. Completed. Sayan Aldava, BS, RDMS, RVT  

## 2014-03-27 ENCOUNTER — Other Ambulatory Visit: Payer: Self-pay | Admitting: *Deleted

## 2014-03-27 DIAGNOSIS — Z9862 Peripheral vascular angioplasty status: Secondary | ICD-10-CM

## 2014-03-27 DIAGNOSIS — I739 Peripheral vascular disease, unspecified: Secondary | ICD-10-CM

## 2014-04-06 ENCOUNTER — Ambulatory Visit (INDEPENDENT_AMBULATORY_CARE_PROVIDER_SITE_OTHER): Payer: BC Managed Care – PPO | Admitting: *Deleted

## 2014-04-06 ENCOUNTER — Encounter: Payer: Self-pay | Admitting: Internal Medicine

## 2014-04-06 ENCOUNTER — Ambulatory Visit (INDEPENDENT_AMBULATORY_CARE_PROVIDER_SITE_OTHER): Payer: BC Managed Care – PPO | Admitting: Internal Medicine

## 2014-04-06 VITALS — BP 130/76 | HR 88 | Ht 63.0 in | Wt 171.9 lb

## 2014-04-06 DIAGNOSIS — Z951 Presence of aortocoronary bypass graft: Secondary | ICD-10-CM

## 2014-04-06 DIAGNOSIS — Z952 Presence of prosthetic heart valve: Secondary | ICD-10-CM

## 2014-04-06 DIAGNOSIS — I255 Ischemic cardiomyopathy: Secondary | ICD-10-CM

## 2014-04-06 DIAGNOSIS — E785 Hyperlipidemia, unspecified: Secondary | ICD-10-CM

## 2014-04-06 DIAGNOSIS — I447 Left bundle-branch block, unspecified: Secondary | ICD-10-CM

## 2014-04-06 DIAGNOSIS — Z954 Presence of other heart-valve replacement: Secondary | ICD-10-CM

## 2014-04-06 DIAGNOSIS — Z7901 Long term (current) use of anticoagulants: Secondary | ICD-10-CM

## 2014-04-06 DIAGNOSIS — Z79899 Other long term (current) drug therapy: Secondary | ICD-10-CM

## 2014-04-06 DIAGNOSIS — I743 Embolism and thrombosis of arteries of the lower extremities: Secondary | ICD-10-CM

## 2014-04-06 DIAGNOSIS — I2699 Other pulmonary embolism without acute cor pulmonale: Secondary | ICD-10-CM

## 2014-04-06 DIAGNOSIS — I2589 Other forms of chronic ischemic heart disease: Secondary | ICD-10-CM

## 2014-04-06 LAB — POCT INR: INR: 2.8

## 2014-04-06 NOTE — Patient Instructions (Signed)
Your physician recommends that you return for lab work at your convenience. You will need to be fasting. (lipid, cmet)  Your physician wants you to follow-up in: 6 months with Dr. Debara Pickett. You will receive a reminder letter in the mail two months in advance. If you don't receive a letter, please call our office to schedule the follow-up appointment.

## 2014-04-06 NOTE — Progress Notes (Signed)
Chief Complaint:  Routine followup  Primary Care Physician: Delia Chimes, NP  HPI:  Katrina Obrien is a 58 year old female who has a history of an unfortunate episode with left popliteal artery thrombus, PE, severe aortic stenosis which was ultimately diagnosed in a bicuspid aortic valve, as well as multivessel coronary disease, anterior MI, EF about 40% to 45% - all which came to a head about the same time. She underwent 3-vessel bypass as well as mechanical aortic valve replacement. She has done well on Coumadin and was ultimately diagnosed with diabetes and is followed by Dr. Chalmers Cater for this. Now she is markedly improved, although she has an ischemic cardiomyopathy with an EF of 40% to 45%. Her activity level is pretty good.  We recently performed repeat lipid testing, which demonstrated an elevated LDL particle number of 2163. The calculated LDL was 80. I asked her to increase her Crestor to 40 mg at night and add 500 mg Niaspan. She reports significant flushing and intolerance to the Niaspan, despite taking aspirin 30 minutes prior.  Her laboratory work does look improved, with her particle number being reduced to 1547, LDL 55, HDL of 39.  She does report however worsening pain in her capsule when she walks that works its way up her legs. This is nothing as significant as when she was originally diagnosed with her acute arterial thrombus, but has concerns for claudication. Alternatively, this could represent myalgias from her statin.  [name] returns today for followup. She is doing extremely well. She continues to be active and has no complaints of chest pain worsening shortness of breath. She does feel like her legs tire easily however this could be improved with more exercise. She denies any symptoms of claudication. Her INR has been therapeutic. She reports good blood sugar control and has an appointment with her endocrinologist in the next couple of weeks. We recently obtained an  echocardiogram which showed normalization of her EF to 55-60% in March of 2015. She's also had lower extremity arterial Dopplers which show preserved ABIs.  PMHx:  Past Medical History  Diagnosis Date  . Diabetes mellitus without complication   . Coronary artery disease     s/p 3- vessel bypass   . Ischemic cardiomyopathy     40 to 45%  . Hyperlipidemia   . Hypertension   . Pulmonary embolism     associated with DVT  . Aortic valve stenosis, severe     and bbicusipid aortic valve status post 19-mm St Jude mechanical aortic valve replacement  . Popliteal artery occlusion, left     recent  status  post angioplasty  . Heart murmur     Past Surgical History  Procedure Laterality Date  . Asd repair    . Aortic valve replacement (avr)/coronary artery bypass grafting (cabg)  08/16/2010  VAN TRIGT    AV replacement w/19-mm mechanical St Jude valve ([id number]),CABGX3  lima to LAD, SVG to distal circ,SVG to posterior descending  . Coronary artery bypass graft  08/16/2010    LIMA TO lad,svg to distal circ ,svg to posterior descending  . Doppler echocardiography  08/09/2010    EF35% to 40% ,bbicuspid ;severely  thickened ,severely calcified leaflets, LV normal  . Nuc    . Nm myocar perf wall motion  08/06/2010    EF 31%  LOW RISK SCAN  . Cardiac catheterization  08/08/2010    ,LAD 50% PROX,605 TANDEM SEGMENTAL STENOSIS,LEFT CIRC 60%-AV GROOVE, RGT COR 40% PROX ON THE BEND ,  80% PROX PDA,  . Lea doppler  08/07/2010    left popliteal occlusion ,and anterior tibial complete occlusion  . Pv angiogram  08/08/2010    successful PTA LEFT POPLITEAL    FAMHx:  Family History  Problem Relation Age of Onset  . Cancer Mother   . Cancer Father   . Stroke Brother   . Heart attack Brother   . Hypertension Brother     SOCHx:   reports that she has never smoked. She has never used smokeless tobacco. She reports that she drinks alcohol. She reports that she does not use illicit  drugs.  ALLERGIES:  No Known Allergies  ROS: A comprehensive review of systems was negative.  HOME MEDS: Current Outpatient Prescriptions  Medication Sig Dispense Refill  . acetaminophen (TYLENOL) 500 MG tablet Take 2,000 mg by mouth every 6 (six) hours as needed for pain.      Marland Kitchen aspirin EC 81 MG tablet Take 81 mg by mouth daily.      . Canagliflozin (INVOKANA) 100 MG TABS Take 300 mg by mouth daily.       Marland Kitchen ezetimibe (ZETIA) 10 MG tablet Take 1 tablet (10 mg total) by mouth daily.  90 tablet  2  . furosemide (LASIX) 40 MG tablet Take 1 tablet (40 mg total) by mouth daily.  90 tablet  3  . levothyroxine (SYNTHROID, LEVOTHROID) 50 MCG tablet Take 50 mcg by mouth daily before breakfast.      . metFORMIN (GLUMETZA) 500 MG (MOD) 24 hr tablet Take 1,000 mg by mouth 2 (two) times daily with a meal.      . metoprolol succinate (TOPROL-XL) 100 MG 24 hr tablet TAKE ONE TABLET BY MOUTH ONCE DAILY  90 tablet  2  . niacin (NIASPAN) 500 MG CR tablet Take 500 mg by mouth at bedtime.      . ramipril (ALTACE) 5 MG capsule Take 5 mg by mouth daily.      . rosuvastatin (CRESTOR) 40 MG tablet Take 1 tablet (40 mg total) by mouth daily.  90 tablet  3  . warfarin (COUMADIN) 2 MG tablet TAKE ONE & ONE-HALF TABLETS BY MOUTH ONCE DAILY AS DIRECTED  120 tablet  1  . zaleplon (SONATA) 5 MG capsule Take 5 mg by mouth at bedtime as needed.       No current facility-administered medications for this visit.    LABS/IMAGING: Results for orders placed in visit on 04/06/14 (from the past 48 hour(s))  POCT INR     Status: None   Collection Time    04/06/14  3:56 PM      Result Value Ref Range   INR 2.8     No results found.  VITALS: BP 130/76  Pulse 88  Ht 5\' 3"  (1.6 m)  Wt 171 lb 14.4 oz (77.973 kg)  BMI 30.46 kg/m2  EXAM: General appearance: alert and no distress Neck: no adenopathy, no carotid bruit, no JVD, supple, symmetrical, trachea midline and thyroid not enlarged, symmetric, no  tenderness/mass/nodules Lungs: clear to auscultation bilaterally Heart: regular rate and rhythm, S1, S2 normal, no murmur, click, rub or gallop and Sharp mechanical valve sound Abdomen: soft, non-tender; bowel sounds normal; no masses,  no organomegaly Extremities: extremities normal, atraumatic, no cyanosis or edema Pulses: 2+ and symmetric Skin: Skin color, texture, turgor normal. No rashes or lesions Neurologic: Grossly normal  EKG: Sinus rhythm with PACs and borderline left bundle branch block with QRS duration 120 msec  ASSESSMENT: 1.  Coronary artery disease status post 3 vessel CABG (LIMA to LAD, SVG to circumflex, SVG to PDA) 2. Severe aortic stenosis and a bicuspid aortic valve, status post 19 mm St. Jude mechanical aortic valve excellent recent left popliteal artery occlusion with rest pain status post angioplasty 3. Pulmonary emboli associated with DVT 4. Ischemic cardiomyopathy, EF now normalized at 55-60% 5. Hypertension - at goal 6. Dyslipidemia - needs rechecked 7. Diabetes type 2 - controlled 8. LBBB - she appears to have developed new conduction delay, but is asymptomatic  PLAN: 1.   Mrs. Infantino is doing well. Her EF is now normalized. She denies any further chest pain. Blood pressure is well controlled. She is due for repeat lipid profile. Her diabetes has been well-controlled. We will check a lipid profile and CMP in plan to see her back in 6 months or sooner if necessary.  Pixie Casino, MD, [facility] Attending Cardiologist [facility] [place] [facility] 04/06/2014, 5:37 PM

## 2014-04-07 ENCOUNTER — Telehealth: Payer: Self-pay | Admitting: Pharmacist Clinician (PhC)/ Clinical Pharmacy Specialist

## 2014-04-07 NOTE — Telephone Encounter (Signed)
Called to schedule appt 

## 2014-05-05 ENCOUNTER — Ambulatory Visit: Payer: BC Managed Care – PPO | Admitting: Pharmacist Clinician (PhC)/ Clinical Pharmacy Specialist

## 2014-05-10 ENCOUNTER — Ambulatory Visit (INDEPENDENT_AMBULATORY_CARE_PROVIDER_SITE_OTHER): Payer: BC Managed Care – PPO | Admitting: Pharmacist Clinician (PhC)/ Clinical Pharmacy Specialist

## 2014-05-10 DIAGNOSIS — Z952 Presence of prosthetic heart valve: Secondary | ICD-10-CM

## 2014-05-10 DIAGNOSIS — Z7901 Long term (current) use of anticoagulants: Secondary | ICD-10-CM

## 2014-05-10 DIAGNOSIS — Z954 Presence of other heart-valve replacement: Secondary | ICD-10-CM

## 2014-05-10 LAB — POCT INR: INR: 3.1

## 2014-06-07 ENCOUNTER — Ambulatory Visit (INDEPENDENT_AMBULATORY_CARE_PROVIDER_SITE_OTHER): Payer: BC Managed Care – PPO | Admitting: Pharmacist Clinician (PhC)/ Clinical Pharmacy Specialist

## 2014-06-07 DIAGNOSIS — Z954 Presence of other heart-valve replacement: Secondary | ICD-10-CM

## 2014-06-07 DIAGNOSIS — Z952 Presence of prosthetic heart valve: Secondary | ICD-10-CM

## 2014-06-07 DIAGNOSIS — Z7901 Long term (current) use of anticoagulants: Secondary | ICD-10-CM

## 2014-06-07 LAB — POCT INR: INR: 3.2

## 2014-07-05 ENCOUNTER — Ambulatory Visit (INDEPENDENT_AMBULATORY_CARE_PROVIDER_SITE_OTHER): Payer: BC Managed Care – PPO | Admitting: Pharmacist Clinician (PhC)/ Clinical Pharmacy Specialist

## 2014-07-05 DIAGNOSIS — Z952 Presence of prosthetic heart valve: Secondary | ICD-10-CM

## 2014-07-05 DIAGNOSIS — Z954 Presence of other heart-valve replacement: Secondary | ICD-10-CM

## 2014-07-05 DIAGNOSIS — Z7901 Long term (current) use of anticoagulants: Secondary | ICD-10-CM

## 2014-07-05 LAB — POCT INR: INR: 2.4

## 2014-07-17 LAB — COMPREHENSIVE METABOLIC PANEL
ALBUMIN: 4.9 g/dL (ref 3.5–5.2)
ALT: 42 U/L — AB (ref 0–35)
AST: 27 U/L (ref 0–37)
Alkaline Phosphatase: 52 U/L (ref 39–117)
BUN: 21 mg/dL (ref 6–23)
CALCIUM: 9.8 mg/dL (ref 8.4–10.5)
CHLORIDE: 100 meq/L (ref 96–112)
CO2: 28 mEq/L (ref 19–32)
Creat: 0.69 mg/dL (ref 0.50–1.10)
Glucose, Bld: 157 mg/dL — ABNORMAL HIGH (ref 70–99)
POTASSIUM: 4.3 meq/L (ref 3.5–5.3)
SODIUM: 138 meq/L (ref 135–145)
TOTAL PROTEIN: 7 g/dL (ref 6.0–8.3)
Total Bilirubin: 0.8 mg/dL (ref 0.2–1.2)

## 2014-07-18 LAB — NMR LIPOPROFILE WITH LIPIDS
CHOLESTEROL, TOTAL: 177 mg/dL (ref 100–199)
HDL Particle Number: 40.7 umol/L (ref >=30.5)
HDL SIZE: 8.6 nm — AB (ref >=9.2)
HDL-C: 44 mg/dL (ref >39)
LARGE VLDL-P: 7.4 nmol/L — AB (ref <=2.7)
LDL CALC: 94 mg/dL (ref 0–99)
LDL PARTICLE NUMBER: 1947 nmol/L — AB (ref <1000)
LDL Size: 19.9 nm (ref >=20.8)
LP-IR SCORE: 84 — AB (ref <=45)
Large HDL-P: 1.7 umol/L — ABNORMAL LOW (ref >=4.8)
Small LDL Particle Number: 1335 nmol/L — ABNORMAL HIGH (ref <=527)
TRIGLYCERIDES: 194 mg/dL — AB (ref 0–149)
VLDL SIZE: 55.2 nm — AB (ref <=46.6)

## 2014-07-26 ENCOUNTER — Ambulatory Visit: Payer: BC Managed Care – PPO | Admitting: Pharmacist Clinician (PhC)/ Clinical Pharmacy Specialist

## 2014-08-16 ENCOUNTER — Ambulatory Visit (INDEPENDENT_AMBULATORY_CARE_PROVIDER_SITE_OTHER): Payer: 59 | Admitting: Pharmacist Clinician (PhC)/ Clinical Pharmacy Specialist

## 2014-08-16 VITALS — Ht 63.0 in | Wt 169.8 lb

## 2014-08-16 DIAGNOSIS — Z952 Presence of prosthetic heart valve: Secondary | ICD-10-CM

## 2014-08-16 DIAGNOSIS — Z954 Presence of other heart-valve replacement: Secondary | ICD-10-CM

## 2014-08-16 DIAGNOSIS — E785 Hyperlipidemia, unspecified: Secondary | ICD-10-CM

## 2014-08-16 LAB — POCT INR: INR: 3.1

## 2014-08-17 ENCOUNTER — Encounter: Payer: Self-pay | Admitting: Pharmacist Clinician (PhC)/ Clinical Pharmacy Specialist

## 2014-08-17 NOTE — Assessment & Plan Note (Signed)
While her LDL number itself is close to the goal of < 70, her particle number is elevated at 1947.  She has a very strong family history of ASCVD, with her father and 3 brothers all having problems before the age of 18.  She is currently maximized on her Crestor and Zetia and has been intolerant to Niaspan.  She does have concerns with costs and cannot afford more than she is already paying out each month for medications.  We have been giving her samples of both Crestor and Zetia when we are able.   Will start the paperwork to get her onto [facility], [facility]) and into their patient assistance program to cover any of her costs.

## 2014-08-17 NOTE — Progress Notes (Signed)
08/17/2014 [name] 11/15/1955 [id number]   HPI:  Katrina Obrien is a 59 y.o. female patient of Dr Rennis Golden, who presents today for a lipid clinic evaluation.  Her cardiac history includes an MI, left popliteal artery thrombus, PE, severe aortic stenosis, EF of 40-45% and diabetes.  All of this happened within a short time just 4 years ago.  She had CABG x 3 and mechanical AVR placed.    RF:  HL, ASCVD, DM  Meds: Zetia 10 mg daily, Crestor 40 mg daily   Intolerant: niacin - flushing  Family history:  Father had first MI at age 25, had 2 other major and multiple silent MIs afterward.  3 brothers, one had stent at ager 16, one had CABG at 56, third died from MI at age 35.  Sister and mother have no heart disease  Diet: Tries to follow healthy diet mix of fruits/vegetables and lean meats.   Exercise: none recently    Labs:  Results for Katrina, Obrien (MRN [id number]) as of 08/17/2014 13:06  Ref. Range 09/27/2013 07/17/2014 08:52  Cholesterol, Total Latest Range: 100-199 mg/dL 784 696  HDL Particle Number Latest Range: >=30.5 umol/L 44.0 40.7  HDL Size Latest Range: >=9.2 nm 8.7 8.6 (L)  HDL-C Latest Range: >39 mg/dL 50 44  Large HDL-P Latest Range: >=4.8 umol/L 2.6 1.7 (L)  Large VLDL-P Latest Range: <=2.7 nmol/L 3.7 7.4 (H)  LDL Particle Number Latest Range: <1000 nmol/L 2139 1947 (H)  LDL Size Latest Range: >=20.8 nm 19.9 19.9  LP-IR Score Latest Range: <=45  69 84 (H)  Small LDL Particle Number Latest Range: <=527 nmol/L 1564 1335 (H)  VLDL Size Latest Range: <=46.6 nm 48 55.2 (H)     Current Outpatient Prescriptions  Medication Sig Dispense Refill  . acetaminophen (TYLENOL) 500 MG tablet Take 2,000 mg by mouth every 6 (six) hours as needed for pain.    Marland Kitchen aspirin EC 81 MG tablet Take 81 mg by mouth daily.    . Canagliflozin (INVOKANA) 100 MG TABS Take 300 mg by mouth daily.     Marland Kitchen ezetimibe (ZETIA) 10 MG tablet Take 1 tablet (10 mg total) by mouth daily. 90 tablet 2  .  furosemide (LASIX) 40 MG tablet Take 1 tablet (40 mg total) by mouth daily. 90 tablet 3  . levothyroxine (SYNTHROID, LEVOTHROID) 50 MCG tablet Take 50 mcg by mouth daily before breakfast.    . metFORMIN (GLUMETZA) 500 MG (MOD) 24 hr tablet Take 1,000 mg by mouth 2 (two) times daily with a meal.    . metoprolol succinate (TOPROL-XL) 100 MG 24 hr tablet TAKE ONE TABLET BY MOUTH ONCE DAILY 90 tablet 2  . ramipril (ALTACE) 5 MG capsule Take 5 mg by mouth daily.    . rosuvastatin (CRESTOR) 40 MG tablet Take 1 tablet (40 mg total) by mouth daily. 90 tablet 3  . warfarin (COUMADIN) 2 MG tablet TAKE ONE & ONE-HALF TABLETS BY MOUTH ONCE DAILY AS DIRECTED 120 tablet 1  . zaleplon (SONATA) 5 MG capsule Take 5 mg by mouth at bedtime as needed.     No current facility-administered medications for this visit.    Allergies  Allergen Reactions  . Niaspan [Niacin Er] Other (See Comments)    Flushing, even with ASA    Past Medical History  Diagnosis Date  . Diabetes mellitus without complication   . Coronary artery disease     s/p 3- vessel bypass   . Ischemic cardiomyopathy  40 to 45%  . Hyperlipidemia   . Hypertension   . Pulmonary embolism     associated with DVT  . Aortic valve stenosis, severe     and bbicusipid aortic valve status post 19-mm St Jude mechanical aortic valve replacement  . Popliteal artery occlusion, left     recent  status  post angioplasty  . Heart murmur     Height 5\' 3"  (1.6 m), weight 169 lb 12.8 oz (77.021 kg).    [name] PharmD CPP [facility]

## 2014-09-01 ENCOUNTER — Telehealth: Payer: Self-pay | Admitting: Internal Medicine

## 2014-09-01 MED ORDER — METOPROLOL SUCCINATE ER 100 MG PO TB24: 100.0000 mg | ORAL_TABLET | Freq: Every day | ORAL | Status: DC

## 2014-09-01 MED ORDER — FUROSEMIDE 40 MG PO TABS: 40.0000 mg | ORAL_TABLET | Freq: Every day | ORAL | Status: DC

## 2014-09-01 NOTE — Telephone Encounter (Signed)
[name] is calling about a prescription on Katrina Obrien ( not sure of what prescription it is ) wanted to see if the doctor could resubmit the prescription for a 90 day supply . Please call

## 2014-09-01 NOTE — Telephone Encounter (Signed)
Party call from [facility] and patient. Insurer is requiring patient to do 30 day refills instead of 90, refilled for meds she was low on.

## 2014-09-13 ENCOUNTER — Ambulatory Visit (INDEPENDENT_AMBULATORY_CARE_PROVIDER_SITE_OTHER): Payer: 59 | Admitting: Pharmacist Clinician (PhC)/ Clinical Pharmacy Specialist

## 2014-09-13 DIAGNOSIS — Z952 Presence of prosthetic heart valve: Secondary | ICD-10-CM

## 2014-09-13 DIAGNOSIS — Z7901 Long term (current) use of anticoagulants: Secondary | ICD-10-CM

## 2014-09-13 DIAGNOSIS — Z954 Presence of other heart-valve replacement: Secondary | ICD-10-CM

## 2014-09-13 LAB — POCT INR: INR: 2.9

## 2014-10-11 ENCOUNTER — Encounter: Payer: Self-pay | Admitting: Internal Medicine

## 2014-10-11 ENCOUNTER — Ambulatory Visit (INDEPENDENT_AMBULATORY_CARE_PROVIDER_SITE_OTHER): Payer: 59 | Admitting: Pharmacist Clinician (PhC)/ Clinical Pharmacy Specialist

## 2014-10-11 ENCOUNTER — Ambulatory Visit (INDEPENDENT_AMBULATORY_CARE_PROVIDER_SITE_OTHER): Payer: 59 | Admitting: Internal Medicine

## 2014-10-11 VITALS — BP 144/64 | HR 79 | Ht 63.0 in | Wt 173.9 lb

## 2014-10-11 DIAGNOSIS — Z954 Presence of other heart-valve replacement: Secondary | ICD-10-CM

## 2014-10-11 DIAGNOSIS — I82409 Acute embolism and thrombosis of unspecified deep veins of unspecified lower extremity: Secondary | ICD-10-CM

## 2014-10-11 DIAGNOSIS — Z951 Presence of aortocoronary bypass graft: Secondary | ICD-10-CM

## 2014-10-11 DIAGNOSIS — I255 Ischemic cardiomyopathy: Secondary | ICD-10-CM

## 2014-10-11 DIAGNOSIS — Z952 Presence of prosthetic heart valve: Secondary | ICD-10-CM

## 2014-10-11 DIAGNOSIS — E118 Type 2 diabetes mellitus with unspecified complications: Secondary | ICD-10-CM

## 2014-10-11 DIAGNOSIS — E785 Hyperlipidemia, unspecified: Secondary | ICD-10-CM

## 2014-10-11 DIAGNOSIS — Z7901 Long term (current) use of anticoagulants: Secondary | ICD-10-CM

## 2014-10-11 LAB — POCT INR: INR: 3.4

## 2014-10-11 NOTE — Patient Instructions (Signed)
Your physician recommends that you return for lab work in: [place] April  Your physician has recommended you make the following change in your medication - DECREASE lasix to 20mg  daily  Your physician wants you to follow-up in: 6 months with Dr. Debara Pickett. You will receive a reminder letter in the mail two months in advance. If you don't receive a letter, please call our office to schedule the follow-up appointment.

## 2014-10-13 NOTE — Progress Notes (Signed)
Chief Complaint:  Routine followup, occasionally feels "swimmy headed"  Primary Care Physician: Delia Chimes, NP  HPI:  Katrina Obrien is a 59 year old female who has a history of an unfortunate episode with left popliteal artery thrombus, PE, severe aortic stenosis which was ultimately diagnosed in a bicuspid aortic valve, as well as multivessel coronary disease, anterior MI, EF about 40% to 45% - all which came to a head about the same time. She underwent 3-vessel bypass as well as mechanical aortic valve replacement. She has done well on Coumadin and was ultimately diagnosed with diabetes and is followed by Dr. Chalmers Cater for this. Now she is markedly improved, although she has an ischemic cardiomyopathy with an EF of 40% to 45%. Her activity level is pretty good.  We recently performed repeat lipid testing, which demonstrated an elevated LDL particle number of 2163. The calculated LDL was 80. I asked her to increase her Crestor to 40 mg at night and add 500 mg Niaspan. She reports significant flushing and intolerance to the Niaspan, despite taking aspirin 30 minutes prior.  Her laboratory work does look improved, with her particle number being reduced to 1547, LDL 55, HDL of 39.  She does report however worsening pain in her capsule when she walks that works its way up her legs. This is nothing as significant as when she was originally diagnosed with her acute arterial thrombus, but has concerns for claudication. Alternatively, this could represent myalgias from her statin.  Katrina Obrien returns today for followup. She is doing extremely well. She continues to be active and has no complaints of chest pain worsening shortness of breath. She does feel like her legs tire easily however this could be improved with more exercise. She denies any symptoms of claudication. Her INR has been therapeutic. She reports good blood sugar control and has an appointment with her endocrinologist in the next couple of  weeks. We recently obtained an echocardiogram which showed normalization of her EF to 55-60% in March of 2015. She's also had lower extremity arterial Dopplers which show preserved ABIs.  I the pleasure seeing Katrina Obrien today. She reports doing fairly well except occasionally she gets somewhat "swimmy headed". This is worse with change of position. As previously noted her EF has improved back to 55-60%. Recently she's had persistently elevated lipoprotein particles including an LDL particle number in the 1900s. This is despite being on Zetia and Crestor 40 mg. Based on this I recommended that she start on Pralulent. She's been taking this now for just over a month and is having no problems with injection site reactions or side effects. We will plan to check her cholesterol again in about 2 months.  PMHx:  Past Medical History  Diagnosis Date  . Diabetes mellitus without complication   . Coronary artery disease     s/p 3- vessel bypass   . Ischemic cardiomyopathy     40 to 45%  . Hyperlipidemia   . Hypertension   . Pulmonary embolism     associated with DVT  . Aortic valve stenosis, severe     and bbicusipid aortic valve status post 19-mm St Jude mechanical aortic valve replacement  . Popliteal artery occlusion, left     recent  status  post angioplasty  . Heart murmur     Past Surgical History  Procedure Laterality Date  . Asd repair    . Aortic valve replacement (avr)/coronary artery bypass grafting (cabg)  08/16/2010  VAN  TRIGT    AV replacement w/19-mm mechanical St Jude valve ([id number]),CABGX3  lima to LAD, SVG to distal circ,SVG to posterior descending  . Coronary artery bypass graft  08/16/2010    LIMA TO lad,svg to distal circ ,svg to posterior descending  . Doppler echocardiography  08/09/2010    EF35% to 40% ,bbicuspid ;severely  thickened ,severely calcified leaflets, LV normal  . Nuc    . Nm myocar perf wall motion  08/06/2010    EF 31%  LOW RISK SCAN  .  Cardiac catheterization  08/08/2010    ,LAD 50% PROX,605 TANDEM SEGMENTAL STENOSIS,LEFT CIRC 60%-AV GROOVE, RGT COR 40% PROX ON THE BEND ,80% PROX PDA,  . Lea doppler  08/07/2010    left popliteal occlusion ,and anterior tibial complete occlusion  . Pv angiogram  08/08/2010    successful PTA LEFT POPLITEAL    FAMHx:  Family History  Problem Relation Age of Onset  . Cancer Mother   . Cancer Father   . CAD Father   . Heart attack Father 24    first MI, 2 major after that, multiple silent MIs  . Stroke Brother   . CAD Brother 25    first stent  . Heart attack Brother   . CAD Brother 53    CABG   . Hypertension Brother   . Heart attack Brother 43    died from MI    SOCHx:   reports that she has never smoked. She has never used smokeless tobacco. She reports that she drinks alcohol. She reports that she does not use illicit drugs.  ALLERGIES:  Allergies  Allergen Reactions  . Niaspan [Niacin Er] Other (See Comments)    Flushing, even with ASA    ROS: A comprehensive review of systems was negative except for: Occasional dizziness  HOME MEDS: Current Outpatient Prescriptions  Medication Sig Dispense Refill  . acetaminophen (TYLENOL) 500 MG tablet Take 2,000 mg by mouth every 6 (six) hours as needed for pain.    . Alirocumab 75 MG/ML SOPN Inject 75 mg into the skin.    Marland Kitchen aspirin EC 81 MG tablet Take 81 mg by mouth daily.    . Canagliflozin (INVOKANA) 100 MG TABS Take 300 mg by mouth daily.     Marland Kitchen ezetimibe (ZETIA) 10 MG tablet Take 1 tablet (10 mg total) by mouth daily. 90 tablet 2  . furosemide (LASIX) 40 MG tablet Take 20 mg by mouth daily.     Marland Kitchen levothyroxine (SYNTHROID, LEVOTHROID) 50 MCG tablet Take 50 mcg by mouth daily before breakfast.    . metFORMIN (GLUMETZA) 500 MG (MOD) 24 hr tablet Take 1,000 mg by mouth 2 (two) times daily with a meal.    . metoprolol succinate (TOPROL-XL) 100 MG 24 hr tablet Take 1 tablet (100 mg total) by mouth daily. Take with or immediately  following a meal. 30 tablet 11  . ramipril (ALTACE) 5 MG capsule Take 5 mg by mouth daily.    . rosuvastatin (CRESTOR) 40 MG tablet Take 1 tablet (40 mg total) by mouth daily. 90 tablet 3  . warfarin (COUMADIN) 2 MG tablet TAKE ONE & ONE-HALF TABLETS BY MOUTH ONCE DAILY AS DIRECTED 120 tablet 1  . zaleplon (SONATA) 5 MG capsule Take 5 mg by mouth at bedtime as needed.     No current facility-administered medications for this visit.    LABS/IMAGING: No results found for this or any previous visit (from the past 48 hour(s)). No results found.  VITALS: BP 144/64 mmHg  Pulse 79  Ht 5\' 3"  (1.6 m)  Wt 173 lb 14.4 oz (78.881 kg)  BMI 30.81 kg/m2  EXAM: General appearance: alert and no distress Neck: no adenopathy, no carotid bruit, no JVD, supple, symmetrical, trachea midline and thyroid not enlarged, symmetric, no tenderness/mass/nodules Lungs: clear to auscultation bilaterally Heart: regular rate and rhythm, S1, S2 normal, no murmur, click, rub or gallop and Sharp mechanical valve sound Abdomen: soft, non-tender; bowel sounds normal; no masses,  no organomegaly Extremities: extremities normal, atraumatic, no cyanosis or edema Pulses: 2+ and symmetric Skin: Skin color, texture, turgor normal. No rashes or lesions Neurologic: Grossly normal  EKG: Sinus rhythm at 79, incomplete left bundle branch pattern, lateral ST and T-wave changes  ASSESSMENT: 1. Coronary artery disease status post 3 vessel CABG (LIMA to LAD, SVG to circumflex, SVG to PDA) 2. Severe aortic stenosis and a bicuspid aortic valve, status post 19 mm St. Jude mechanical aortic valve excellent recent left popliteal artery occlusion with rest pain status post angioplasty 3. Pulmonary emboli associated with DVT 4. Ischemic cardiomyopathy, EF now normalized at 55-60% 5. Hypertension - at goal 6. Dyslipidemia - needs rechecked 7. Diabetes type 2 - controlled 8. LBBB - she appears to have developed new conduction delay, but  is asymptomatic  PLAN: 1.   Katrina Obrien has lightheadedness, there is also positional dizziness. This could be due to some dehydration. She continues to be on Lasix 40 mg daily, despite the fact her EF is now normalized. I recommended decreasing her Lasix down to 20 mg daily. Hopefully this will be helpful. Ultimately we might be able to stop it. She does get some occasional leg swelling still. She seems to be doing well on Pralulent, and hopefully her cholesterol will move closer to goal. We will recheck her cholesterol months and plan to see her back in 6 months.  Pixie Casino, MD, [facility] Attending Cardiologist [facility] [place] [facility] 10/13/2014, 7:42 AM

## 2014-11-08 ENCOUNTER — Ambulatory Visit (INDEPENDENT_AMBULATORY_CARE_PROVIDER_SITE_OTHER): Payer: 59 | Admitting: Pharmacist Clinician (PhC)/ Clinical Pharmacy Specialist

## 2014-11-08 DIAGNOSIS — Z954 Presence of other heart-valve replacement: Secondary | ICD-10-CM | POA: Diagnosis not present

## 2014-11-08 DIAGNOSIS — Z7901 Long term (current) use of anticoagulants: Secondary | ICD-10-CM

## 2014-11-08 DIAGNOSIS — Z952 Presence of prosthetic heart valve: Secondary | ICD-10-CM

## 2014-11-08 LAB — POCT INR: INR: 3.9

## 2014-11-08 MED ORDER — WARFARIN SODIUM 2 MG PO TABS: ORAL_TABLET | ORAL | Status: DC

## 2014-11-09 ENCOUNTER — Telehealth: Payer: Self-pay | Admitting: Internal Medicine

## 2014-11-09 NOTE — Telephone Encounter (Signed)
Authorization for her Praluent was denied. Does the physician want to appeal the denial?

## 2014-11-13 NOTE — Telephone Encounter (Signed)
Katrina Obrien composed appeal letter to be faxed

## 2014-11-14 ENCOUNTER — Telehealth: Payer: Self-pay | Admitting: Pharmacist Clinician (PhC)/ Clinical Pharmacy Specialist

## 2014-11-14 NOTE — Telephone Encounter (Signed)
Pt called in wanting to inform Katrina Obrien the her insurance did deny the medication Praluent , a medication that Dr. Debara Pickett had prescribed for her. Please f/u with her   Thanks

## 2014-11-14 NOTE — Telephone Encounter (Signed)
Aware of denial, letter sent to insurance

## 2014-11-24 ENCOUNTER — Telehealth: Payer: Self-pay | Admitting: Internal Medicine

## 2014-11-24 ENCOUNTER — Other Ambulatory Visit: Payer: Self-pay | Admitting: *Deleted

## 2014-11-24 MED ORDER — ROSUVASTATIN CALCIUM 40 MG PO TABS: 40.0000 mg | ORAL_TABLET | Freq: Every day | ORAL | Status: DC

## 2014-11-24 NOTE — Telephone Encounter (Signed)
°  1. Which medications need to be refilled? Crestor   2. Which pharmacy is medication to be sent to?[facility] on [address]  3. Do they need a 30 day or 90 day supply? 30( she stated that she would prefer a 90 day if possible)  4. Would they like a call back once the medication has been sent to the pharmacy? yes

## 2014-11-24 NOTE — Telephone Encounter (Signed)
script has been completed electronically Patient mailbox is full

## 2014-11-28 LAB — NMR LIPOPROFILE WITH LIPIDS
CHOLESTEROL, TOTAL: 80 mg/dL — AB (ref 100–199)
HDL Particle Number: 36.1 umol/L (ref >=30.5)
HDL Size: 8.5 nm — ABNORMAL LOW (ref >=9.2)
HDL-C: 38 mg/dL — ABNORMAL LOW (ref >39)
LDL (calc): 20 mg/dL (ref 0–99)
LDL Particle Number: 639 nmol/L (ref <1000)
LDL Size: 19.8 nm (ref >=20.8)
LP-IR SCORE: 84 — AB (ref <=45)
Large HDL-P: 2.3 umol/L — ABNORMAL LOW (ref >=4.8)
Large VLDL-P: 4.6 nmol/L — ABNORMAL HIGH (ref <=2.7)
Small LDL Particle Number: 425 nmol/L (ref <=527)
TRIGLYCERIDES: 108 mg/dL (ref 0–149)
VLDL Size: 56.8 nm — ABNORMAL HIGH (ref <=46.6)

## 2014-11-29 ENCOUNTER — Ambulatory Visit (INDEPENDENT_AMBULATORY_CARE_PROVIDER_SITE_OTHER): Payer: 59 | Admitting: Pharmacist Clinician (PhC)/ Clinical Pharmacy Specialist

## 2014-11-29 DIAGNOSIS — Z7901 Long term (current) use of anticoagulants: Secondary | ICD-10-CM

## 2014-11-29 DIAGNOSIS — Z954 Presence of other heart-valve replacement: Secondary | ICD-10-CM | POA: Diagnosis not present

## 2014-11-29 DIAGNOSIS — Z952 Presence of prosthetic heart valve: Secondary | ICD-10-CM

## 2014-11-29 LAB — POCT INR: INR: 3.1

## 2015-01-04 ENCOUNTER — Ambulatory Visit (INDEPENDENT_AMBULATORY_CARE_PROVIDER_SITE_OTHER): Payer: 59 | Admitting: Pharmacist Clinician (PhC)/ Clinical Pharmacy Specialist

## 2015-01-04 DIAGNOSIS — Z7901 Long term (current) use of anticoagulants: Secondary | ICD-10-CM

## 2015-01-04 DIAGNOSIS — Z954 Presence of other heart-valve replacement: Secondary | ICD-10-CM | POA: Diagnosis not present

## 2015-01-04 DIAGNOSIS — Z952 Presence of prosthetic heart valve: Secondary | ICD-10-CM

## 2015-01-04 LAB — POCT INR: INR: 3.2

## 2015-01-04 MED ORDER — EZETIMIBE 10 MG PO TABS: 10.0000 mg | ORAL_TABLET | Freq: Every day | ORAL | Status: DC

## 2015-02-14 ENCOUNTER — Ambulatory Visit (INDEPENDENT_AMBULATORY_CARE_PROVIDER_SITE_OTHER): Payer: 59 | Admitting: Pharmacist Clinician (PhC)/ Clinical Pharmacy Specialist

## 2015-02-14 DIAGNOSIS — Z7901 Long term (current) use of anticoagulants: Secondary | ICD-10-CM | POA: Diagnosis not present

## 2015-02-14 DIAGNOSIS — Z952 Presence of prosthetic heart valve: Secondary | ICD-10-CM

## 2015-02-14 DIAGNOSIS — Z954 Presence of other heart-valve replacement: Secondary | ICD-10-CM

## 2015-02-14 LAB — POCT INR: INR: 3.3

## 2015-03-28 ENCOUNTER — Ambulatory Visit (INDEPENDENT_AMBULATORY_CARE_PROVIDER_SITE_OTHER): Payer: 59 | Admitting: Pharmacist Clinician (PhC)/ Clinical Pharmacy Specialist

## 2015-03-28 DIAGNOSIS — Z954 Presence of other heart-valve replacement: Secondary | ICD-10-CM

## 2015-03-28 DIAGNOSIS — Z7901 Long term (current) use of anticoagulants: Secondary | ICD-10-CM | POA: Diagnosis not present

## 2015-03-28 DIAGNOSIS — Z952 Presence of prosthetic heart valve: Secondary | ICD-10-CM

## 2015-03-28 LAB — POCT INR: INR: 3.4

## 2015-05-09 ENCOUNTER — Ambulatory Visit (INDEPENDENT_AMBULATORY_CARE_PROVIDER_SITE_OTHER): Payer: 59 | Admitting: Pharmacist

## 2015-05-09 DIAGNOSIS — Z954 Presence of other heart-valve replacement: Secondary | ICD-10-CM | POA: Diagnosis not present

## 2015-05-09 DIAGNOSIS — Z952 Presence of prosthetic heart valve: Secondary | ICD-10-CM

## 2015-05-09 DIAGNOSIS — Z7901 Long term (current) use of anticoagulants: Secondary | ICD-10-CM | POA: Diagnosis not present

## 2015-05-09 LAB — POCT INR: INR: 2.4

## 2015-05-29 ENCOUNTER — Other Ambulatory Visit: Payer: Self-pay | Admitting: *Deleted

## 2015-05-29 MED ORDER — WARFARIN SODIUM 2 MG PO TABS: ORAL_TABLET | ORAL | Status: DC

## 2015-06-14 ENCOUNTER — Encounter: Payer: Self-pay | Admitting: Internal Medicine

## 2015-06-14 ENCOUNTER — Ambulatory Visit (INDEPENDENT_AMBULATORY_CARE_PROVIDER_SITE_OTHER): Payer: 59 | Admitting: Internal Medicine

## 2015-06-14 VITALS — BP 134/76 | HR 87 | Ht 63.0 in | Wt 171.9 lb

## 2015-06-14 DIAGNOSIS — I255 Ischemic cardiomyopathy: Secondary | ICD-10-CM

## 2015-06-14 DIAGNOSIS — I447 Left bundle-branch block, unspecified: Secondary | ICD-10-CM

## 2015-06-14 DIAGNOSIS — I10 Essential (primary) hypertension: Secondary | ICD-10-CM

## 2015-06-14 DIAGNOSIS — Z951 Presence of aortocoronary bypass graft: Secondary | ICD-10-CM | POA: Diagnosis not present

## 2015-06-14 DIAGNOSIS — I350 Nonrheumatic aortic (valve) stenosis: Secondary | ICD-10-CM

## 2015-06-14 DIAGNOSIS — I739 Peripheral vascular disease, unspecified: Secondary | ICD-10-CM | POA: Diagnosis not present

## 2015-06-14 DIAGNOSIS — E785 Hyperlipidemia, unspecified: Secondary | ICD-10-CM | POA: Diagnosis not present

## 2015-06-14 DIAGNOSIS — Z7901 Long term (current) use of anticoagulants: Secondary | ICD-10-CM

## 2015-06-14 DIAGNOSIS — Z952 Presence of prosthetic heart valve: Secondary | ICD-10-CM

## 2015-06-14 DIAGNOSIS — Z9862 Peripheral vascular angioplasty status: Secondary | ICD-10-CM

## 2015-06-14 DIAGNOSIS — Z954 Presence of other heart-valve replacement: Secondary | ICD-10-CM

## 2015-06-14 NOTE — Progress Notes (Signed)
Chief Complaint:  Occasional leg swelling and cramping of the right leg  Primary Care Physician: Delia Chimes, NP  HPI:  Katrina Obrien is a 59 year old female who has a history of an unfortunate episode with left popliteal artery thrombus, PE, severe aortic stenosis which was ultimately diagnosed in a bicuspid aortic valve, as well as multivessel coronary disease, anterior MI, EF about 40% to 45% - all which came to a head about the same time. She underwent 3-vessel bypass as well as mechanical aortic valve replacement. She has done well on Coumadin and was ultimately diagnosed with diabetes and is followed by Dr. Chalmers Cater for this. Now she is markedly improved, although she has an ischemic cardiomyopathy with an EF of 40% to 45%. Her activity level is pretty good.  We recently performed repeat lipid testing, which demonstrated an elevated LDL particle number of 2163. The calculated LDL was 80. I asked her to increase her Crestor to 40 mg at night and add 500 mg Niaspan. She reports significant flushing and intolerance to the Niaspan, despite taking aspirin 30 minutes prior.  Her laboratory work does look improved, with her particle number being reduced to 1547, LDL 55, HDL of 39.  She does report however worsening pain in her capsule when she walks that works its way up her legs. This is nothing as significant as when she was originally diagnosed with her acute arterial thrombus, but has concerns for claudication. Alternatively, this could represent myalgias from her statin.  Katrina Obrien returns today for followup. She is doing extremely well. She continues to be active and has no complaints of chest pain worsening shortness of breath. She does feel like her legs tire easily however this could be improved with more exercise. She denies any symptoms of claudication. Her INR has been therapeutic. She reports good blood sugar control and has an appointment with her endocrinologist in the next couple of  weeks. We recently obtained an echocardiogram which showed normalization of her EF to 55-60% in March of 2015. She's also had lower extremity arterial Dopplers which show preserved ABIs.  I the pleasure seeing Katrina Obrien back in the office today. She reports doing fairly well except occasionally she gets somewhat "swimmy headed". This is worse with change of position. As previously noted her EF has improved back to 55-60%. Recently she's had persistently elevated lipoprotein particles including an LDL particle number in the 1900s. This is despite being on Zetia and Crestor 40 mg. Based on this I recommended that she start on Pralulent. She's been taking this now for just over a month and is having no problems with injection site reactions or side effects. We will plan to check her cholesterol again in about 2 months.  Katrina Obrien was seen in the office today in follow-up. Overall she seems to be doing very well. 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 She will be due for repeat cholesterol test after the first of the year. She denies any chest pain or worsening shortness of breath. She is complaining of some occasional swelling in her lower extremities which improves in the morning. She also gets some pain in her right leg. She is overdue for repeat lower extremity Dopplers and does have a history of course of arterial thrombus in the leg. Warfarin has been therapeutic and managed by the Coumadin clinic. Cholesterol is still not at goal despite max dose therapy. For brief period of time she was on samples of Repatha (PCSK9 inhibitor) with  a marked response in cholesterol, however insurance would not cover this.  PMHx:  Past Medical History  Diagnosis Date  . Diabetes mellitus without complication ([place])   . Coronary artery disease     s/p 3- vessel bypass   . Ischemic cardiomyopathy     40 to 45%  . Hyperlipidemia   . Hypertension   . Pulmonary embolism ([place])     associated with DVT  . Aortic  valve stenosis, severe     and bbicusipid aortic valve status post 19-mm St Jude mechanical aortic valve replacement  . Popliteal artery occlusion, left ([facility])     recent  status  post angioplasty  . Heart murmur     Past Surgical History  Procedure Laterality Date  . Asd repair    . Aortic valve replacement (avr)/coronary artery bypass grafting (cabg)  08/16/2010  VAN TRIGT    AV replacement w/19-mm mechanical St Jude valve ([id number]),CABGX3  Katrina Obrien to LAD, SVG to distal circ,SVG to posterior descending  . Coronary artery bypass graft  08/16/2010    Katrina Obrien TO lad,svg to distal circ ,svg to posterior descending  . Doppler echocardiography  08/09/2010    EF35% to 40% ,bbicuspid ;severely  thickened ,severely calcified leaflets, LV normal  . Nuc    . Nm myocar perf wall motion  08/06/2010    EF 31%  LOW RISK SCAN  . Cardiac catheterization  08/08/2010    ,LAD 50% PROX,605 TANDEM SEGMENTAL STENOSIS,LEFT CIRC 60%-AV GROOVE, RGT COR 40% PROX ON THE BEND ,80% PROX PDA,  . Lea doppler  08/07/2010    left popliteal occlusion ,and anterior tibial complete occlusion  . Pv angiogram  08/08/2010    successful PTA LEFT POPLITEAL    FAMHx:  Family History  Problem Relation Age of Onset  . Cancer Mother   . Cancer Father   . CAD Father   . Heart attack Father 66    first MI, 2 major after that, multiple silent MIs  . Stroke Brother   . CAD Brother 15    first stent  . Heart attack Brother   . CAD Brother 98    CABG   . Hypertension Brother   . Heart attack Brother 97    died from MI    SOCHx:   reports that she has never smoked. She has never used smokeless tobacco. She reports that she drinks alcohol. She reports that she does not use illicit drugs.  ALLERGIES:  Allergies  Allergen Reactions  . Niaspan [Niacin Er] Other (See Comments)    Flushing, even with ASA    ROS: A comprehensive review of systems was negative except for: Musculoskeletal: positive for myalgias  HOME  MEDS: Current Outpatient Prescriptions  Medication Sig Dispense Refill  . acetaminophen (TYLENOL) 500 MG tablet Take 2,000 mg by mouth every 6 (six) hours as needed for pain.    Marland Kitchen aspirin EC 81 MG tablet Take 81 mg by mouth daily.    . Canagliflozin (INVOKANA) 100 MG TABS Take 300 mg by mouth daily.     . CRESTOR 40 MG tablet TAKE ONE TABLET BY MOUTH ONCE DAILY 30 tablet 12  . ezetimibe (ZETIA) 10 MG tablet Take 1 tablet (10 mg total) by mouth daily. 42 tablet 0  . furosemide (LASIX) 40 MG tablet Take 20 mg by mouth daily.     Marland Kitchen levothyroxine (SYNTHROID, LEVOTHROID) 50 MCG tablet Take 50 mcg by mouth daily before breakfast.    . metFORMIN (GLUMETZA) 500  MG (MOD) 24 hr tablet Take 1,000 mg by mouth 2 (two) times daily with a meal.    . metoprolol succinate (TOPROL-XL) 100 MG 24 hr tablet Take 1 tablet (100 mg total) by mouth daily. Take with or immediately following a meal. 30 tablet 11  . ramipril (ALTACE) 5 MG capsule Take 5 mg by mouth daily.    Marland Kitchen warfarin (COUMADIN) 2 MG tablet Take 1 to 1.5 tablets by mouth daily as directed by coumadin clinic 120 tablet 1  . zaleplon (SONATA) 5 MG capsule Take 5 mg by mouth at bedtime as needed.     No current facility-administered medications for this visit.    LABS/IMAGING: No results found for this or any previous visit (from the past 48 hour(s)). No results found.  VITALS: BP 134/76 mmHg  Pulse 87  Ht 5\' 3"  (1.6 m)  Wt 171 lb 14.4 oz (77.973 kg)  BMI 30.46 kg/m2  EXAM: General appearance: alert and no distress Neck: no adenopathy, no carotid bruit, no JVD, supple, symmetrical, trachea midline and thyroid not enlarged, symmetric, no tenderness/mass/nodules Lungs: clear to auscultation bilaterally Heart: regular rate and rhythm, S1, S2 normal, no murmur, click, rub or gallop and Sharp mechanical valve sound Abdomen: soft, non-tender; bowel sounds normal; no masses,  no organomegaly Extremities: extremities normal, atraumatic, no cyanosis or  edema Pulses: 2+ and symmetric Skin: Skin color, texture, turgor normal. No rashes or lesions Neurologic: Grossly normal  EKG: Sinus rhythm at 87, left bundle branch block  ASSESSMENT: 1. Coronary artery disease status post 3 vessel CABG (Katrina Obrien to LAD, SVG to circumflex, SVG to PDA) 2. Severe aortic stenosis and a bicuspid aortic valve, status post 19 mm St. Jude mechanical aortic valve excellent recent left popliteal artery occlusion with rest pain status post angioplasty 3. Pulmonary emboli associated with DVT 4. Ischemic cardiomyopathy, EF now normalized at 55-60% 5. Hypertension - at goal 6. Dyslipidemia - needs rechecked 7. Diabetes type 2 - controlled 8. LBBB   PLAN: 1.   Mrs. Swiatek seems to be doing very well. She is complaining of some right leg pain, particularly behind her right knee and right calf. This is worse slightly when walking and relieved at rest. We'll go ahead and obtain repeat lower chimney arterial Dopplers which are overdue from the prior study in August 2015. No changes to her medicines today. Blood pressure is well-controlled. She will have a recheck of her cholesterol after the new year. Plan to see her back in 6 months or sooner as necessary.  Pixie Casino, MD, [facility] Attending Cardiologist [place] 06/14/2015, 4:44 PM

## 2015-06-14 NOTE — Patient Instructions (Addendum)
Your physician has requested that you have a lower extremity arterial duplex. This test is an ultrasound of the arteries in the legs. It looks at arterial blood flow in the legs. Allow one hour for Lower Arterial scans. There are no restrictions or special instructions - order in [facility]  Your physician recommends that you return for lab work FASTING in January 2017  STOP zetia mid December  Your physician wants you to follow-up in: 6 months with Dr. Debara Pickett. You will receive a reminder letter in the mail two months in advance. If you don't receive a letter, please call our office to schedule the follow-up appointment.

## 2015-06-18 ENCOUNTER — Ambulatory Visit: Payer: 59 | Admitting: Pharmacist Clinician (PhC)/ Clinical Pharmacy Specialist

## 2015-06-20 ENCOUNTER — Other Ambulatory Visit: Payer: Self-pay | Admitting: Internal Medicine

## 2015-06-20 DIAGNOSIS — I739 Peripheral vascular disease, unspecified: Secondary | ICD-10-CM

## 2015-06-25 ENCOUNTER — Ambulatory Visit (INDEPENDENT_AMBULATORY_CARE_PROVIDER_SITE_OTHER): Payer: 59 | Admitting: Pharmacist Clinician (PhC)/ Clinical Pharmacy Specialist

## 2015-06-25 DIAGNOSIS — Z954 Presence of other heart-valve replacement: Secondary | ICD-10-CM

## 2015-06-25 DIAGNOSIS — Z952 Presence of prosthetic heart valve: Secondary | ICD-10-CM

## 2015-06-25 DIAGNOSIS — Z7901 Long term (current) use of anticoagulants: Secondary | ICD-10-CM

## 2015-06-25 LAB — POCT INR: INR: 2.9

## 2015-07-04 ENCOUNTER — Ambulatory Visit (HOSPITAL_COMMUNITY)
Admission: RE | Admit: 2015-07-04 | Discharge: 2015-07-04 | Disposition: A | Discharge status: Home / Self Care | Payer: 59 | Source: Ambulatory Visit | Attending: Cardiovascular Disease | Admitting: Cardiovascular Disease

## 2015-07-04 DIAGNOSIS — E119 Type 2 diabetes mellitus without complications: Secondary | ICD-10-CM | POA: Diagnosis not present

## 2015-07-04 DIAGNOSIS — E785 Hyperlipidemia, unspecified: Secondary | ICD-10-CM | POA: Insufficient documentation

## 2015-07-04 DIAGNOSIS — R938 Abnormal findings on diagnostic imaging of other specified body structures: Secondary | ICD-10-CM | POA: Insufficient documentation

## 2015-07-04 DIAGNOSIS — I739 Peripheral vascular disease, unspecified: Secondary | ICD-10-CM | POA: Diagnosis not present

## 2015-07-04 DIAGNOSIS — I10 Essential (primary) hypertension: Secondary | ICD-10-CM | POA: Insufficient documentation

## 2015-08-06 ENCOUNTER — Ambulatory Visit (INDEPENDENT_AMBULATORY_CARE_PROVIDER_SITE_OTHER): Payer: BLUE CROSS/BLUE SHIELD | Admitting: Pharmacist Clinician (PhC)/ Clinical Pharmacy Specialist

## 2015-08-06 DIAGNOSIS — Z952 Presence of prosthetic heart valve: Secondary | ICD-10-CM

## 2015-08-06 DIAGNOSIS — Z7901 Long term (current) use of anticoagulants: Secondary | ICD-10-CM

## 2015-08-06 DIAGNOSIS — Z954 Presence of other heart-valve replacement: Secondary | ICD-10-CM | POA: Diagnosis not present

## 2015-08-06 LAB — POCT INR: INR: 2.7

## 2015-08-22 ENCOUNTER — Other Ambulatory Visit: Payer: Self-pay | Admitting: Internal Medicine

## 2015-08-22 ENCOUNTER — Encounter: Payer: Self-pay | Admitting: Internal Medicine

## 2015-08-22 ENCOUNTER — Other Ambulatory Visit: Payer: Self-pay | Admitting: *Deleted

## 2015-08-22 MED ORDER — METOPROLOL SUCCINATE ER 100 MG PO TB24: 100.0000 mg | ORAL_TABLET | Freq: Every day | ORAL | Status: DC

## 2015-08-22 MED ORDER — ROSUVASTATIN CALCIUM 40 MG PO TABS: 40.0000 mg | ORAL_TABLET | Freq: Every day | ORAL | Status: DC

## 2015-08-22 MED ORDER — FUROSEMIDE 40 MG PO TABS: 20.0000 mg | ORAL_TABLET | Freq: Every day | ORAL | Status: DC

## 2015-08-24 ENCOUNTER — Other Ambulatory Visit: Payer: Self-pay | Admitting: Pharmacist Clinician (PhC)/ Clinical Pharmacy Specialist

## 2015-08-24 MED ORDER — WARFARIN SODIUM 2 MG PO TABS: ORAL_TABLET | ORAL | Status: DC

## 2015-09-17 ENCOUNTER — Ambulatory Visit (INDEPENDENT_AMBULATORY_CARE_PROVIDER_SITE_OTHER): Payer: BLUE CROSS/BLUE SHIELD | Admitting: Pharmacist Clinician (PhC)/ Clinical Pharmacy Specialist

## 2015-09-17 DIAGNOSIS — Z952 Presence of prosthetic heart valve: Secondary | ICD-10-CM

## 2015-09-17 DIAGNOSIS — Z954 Presence of other heart-valve replacement: Secondary | ICD-10-CM

## 2015-09-17 DIAGNOSIS — Z7901 Long term (current) use of anticoagulants: Secondary | ICD-10-CM

## 2015-09-17 LAB — POCT INR: INR: 3.2

## 2015-09-20 ENCOUNTER — Other Ambulatory Visit: Payer: Self-pay | Admitting: Pharmacist Clinician (PhC)/ Clinical Pharmacy Specialist

## 2015-09-20 DIAGNOSIS — Z952 Presence of prosthetic heart valve: Secondary | ICD-10-CM

## 2015-09-20 DIAGNOSIS — Z7901 Long term (current) use of anticoagulants: Secondary | ICD-10-CM

## 2015-10-29 ENCOUNTER — Ambulatory Visit (INDEPENDENT_AMBULATORY_CARE_PROVIDER_SITE_OTHER): Payer: BLUE CROSS/BLUE SHIELD | Admitting: Pharmacist Clinician (PhC)/ Clinical Pharmacy Specialist

## 2015-10-29 DIAGNOSIS — Z7901 Long term (current) use of anticoagulants: Secondary | ICD-10-CM

## 2015-10-29 LAB — POCT INR: INR: 3.3

## 2015-12-05 ENCOUNTER — Encounter: Payer: Self-pay | Admitting: Internal Medicine

## 2015-12-05 LAB — LIPID PANEL
CHOLESTEROL: 166 mg/dL (ref 125–200)
HDL: 35 mg/dL — ABNORMAL LOW (ref >=46)
LDL CALC: 102 mg/dL (ref <130)
TRIGLYCERIDES: 145 mg/dL (ref <150)
Total CHOL/HDL Ratio: 4.7 Ratio (ref <=5.0)
VLDL: 29 mg/dL (ref <30)

## 2015-12-10 ENCOUNTER — Ambulatory Visit (INDEPENDENT_AMBULATORY_CARE_PROVIDER_SITE_OTHER): Payer: BLUE CROSS/BLUE SHIELD | Admitting: Pharmacist

## 2015-12-10 ENCOUNTER — Encounter: Payer: Self-pay | Admitting: Internal Medicine

## 2015-12-10 ENCOUNTER — Ambulatory Visit (INDEPENDENT_AMBULATORY_CARE_PROVIDER_SITE_OTHER): Payer: BLUE CROSS/BLUE SHIELD | Admitting: Internal Medicine

## 2015-12-10 VITALS — BP 162/79 | HR 88 | Ht 63.0 in | Wt 180.2 lb

## 2015-12-10 DIAGNOSIS — Z7901 Long term (current) use of anticoagulants: Secondary | ICD-10-CM | POA: Diagnosis not present

## 2015-12-10 DIAGNOSIS — I743 Embolism and thrombosis of arteries of the lower extremities: Secondary | ICD-10-CM

## 2015-12-10 DIAGNOSIS — Z954 Presence of other heart-valve replacement: Secondary | ICD-10-CM

## 2015-12-10 DIAGNOSIS — Z951 Presence of aortocoronary bypass graft: Secondary | ICD-10-CM

## 2015-12-10 DIAGNOSIS — I447 Left bundle-branch block, unspecified: Secondary | ICD-10-CM | POA: Diagnosis not present

## 2015-12-10 DIAGNOSIS — Z952 Presence of prosthetic heart valve: Secondary | ICD-10-CM

## 2015-12-10 DIAGNOSIS — I255 Ischemic cardiomyopathy: Secondary | ICD-10-CM

## 2015-12-10 DIAGNOSIS — I10 Essential (primary) hypertension: Secondary | ICD-10-CM

## 2015-12-10 LAB — POCT INR: INR: 2

## 2015-12-10 NOTE — Patient Instructions (Addendum)
Your physician has requested that you have an echocardiogram @ [address] - [address]rd [address]. Echocardiography is a painless test that uses sound waves to create images of your heart. It provides your doctor with information about the size and shape of your heart and how well your heart's chambers and valves are working. This procedure takes approximately one hour. There are no restrictions for this procedure.  Your physician recommends that you schedule a [facility] appointment with our clinical pharmacist to discuss/REPATHA -- please schedule same day as next INR if possible  Your physician wants you to follow-up in: 6 months with Dr. Debara Pickett. You will receive a reminder letter in the mail two months in advance. If you don't receive a letter, please call our office to schedule the follow-up appointment.

## 2015-12-11 NOTE — Progress Notes (Signed)
Chief Complaint:  No complaints  Primary Care Physician: Delia Chimes, NP  HPI:  Katrina Obrien is a 60 year old female who has a history of an unfortunate episode with left popliteal artery thrombus, PE, severe aortic stenosis which was ultimately diagnosed in a bicuspid aortic valve, as well as multivessel coronary disease, anterior MI, EF about 40% to 45% - all which came to a head about the same time. She underwent 3-vessel bypass as well as mechanical aortic valve replacement. She has done well on Coumadin and was ultimately diagnosed with diabetes and is followed by Dr. Chalmers Cater for this. Now she is markedly improved, although she has an ischemic cardiomyopathy with an EF of 40% to 45%. Her activity level is pretty good.  We recently performed repeat lipid testing, which demonstrated an elevated LDL particle number of 2163. The calculated LDL was 80. I asked her to increase her Crestor to 40 mg at night and add 500 mg Niaspan. She reports significant flushing and intolerance to the Niaspan, despite taking aspirin 30 minutes prior.  Her laboratory work does look improved, with her particle number being reduced to 1547, LDL 55, HDL of 39.  She does report however worsening pain in her capsule when she walks that works its way up her legs. This is nothing as significant as when she was originally diagnosed with her acute arterial thrombus, but has concerns for claudication. Alternatively, this could represent myalgias from her statin.  Katrina Obrien returns today for followup. She is doing extremely well. She continues to be active and has no complaints of chest pain worsening shortness of breath. She does feel like her legs tire easily however this could be improved with more exercise. She denies any symptoms of claudication. Her INR has been therapeutic. She reports good blood sugar control and has an appointment with her endocrinologist in the next couple of weeks. We recently obtained an  echocardiogram which showed normalization of her EF to 55-60% in March of 2015. She's also had lower extremity arterial Dopplers which show preserved ABIs.  I the pleasure seeing Katrina Obrien back in the office today. She reports doing fairly well except occasionally she gets somewhat "swimmy headed". This is worse with change of position. As previously noted her EF has improved back to 55-60%. Recently she's had persistently elevated lipoprotein particles including an LDL particle number in the 1900s. This is despite being on Zetia and Crestor 40 mg. Based on this I recommended that she start on Pralulent. She's been taking this now for just over a month and is having no problems with injection site reactions or side effects. We will plan to check her cholesterol again in about 2 months.  Katrina Obrien was seen in the office today in follow-up. Overall she seems to be doing very well. 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 She will be due for repeat cholesterol test after the first of the year. She denies any chest pain or worsening shortness of breath. She is complaining of some occasional swelling in her lower extremities which improves in the morning. She also gets some pain in her right leg. She is overdue for repeat lower extremity Dopplers and does have a history of course of arterial thrombus in the leg. Warfarin has been therapeutic and managed by the Coumadin clinic. Cholesterol is still not at goal despite max dose therapy. For brief period of time she was on samples of Repatha (PCSK9 inhibitor) with a marked response in cholesterol, however insurance  would not cover this.  12/10/2015  Katrina Obrien returns today for follow-up. She says this is the best she has felt some time. She denies any chest pain or worsening shortness of breath. Recently her cholesterol numbers have risen with LDL now 102. This would put her not at goal therapy. Given her multiple comorbidities. Recent data indicates the fact  that driving LDL below 50 may improve cardiovascular outcomes. This was with the use of maximal statin therapy as well as the addition of a PCSK9 inhibitor, specifically Repatha. In fact, she had responded very well to Pralulent in the past, with LDL as low as 20 however that was not covered by insurance. 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 Aggressive therapy is warranted.  PMHx:  Past Medical History  Diagnosis Date  . Diabetes mellitus without complication ([place])   . Coronary artery disease     s/p 3- vessel bypass   . Ischemic cardiomyopathy     40 to 45%  . Hyperlipidemia   . Hypertension   . Pulmonary embolism ([place])     associated with DVT  . Aortic valve stenosis, severe     and bbicusipid aortic valve status post 19-mm St Jude mechanical aortic valve replacement  . Popliteal artery occlusion, left ([facility])     recent  status  post angioplasty  . Heart murmur     Past Surgical History  Procedure Laterality Date  . Asd repair    . Aortic valve replacement (avr)/coronary artery bypass grafting (cabg)  08/16/2010  Katrina Obrien    AV replacement w/19-mm mechanical St Jude valve ([id number]),CABGX3  lima to LAD, SVG to distal circ,SVG to posterior descending  . Coronary artery bypass graft  08/16/2010    LIMA TO lad,svg to distal circ ,svg to posterior descending  . Doppler echocardiography  08/09/2010    EF35% to 40% ,bbicuspid ;severely  thickened ,severely calcified leaflets, LV normal  . Nuc    . Nm myocar perf wall motion  08/06/2010    EF 31%  LOW RISK SCAN  . Cardiac catheterization  08/08/2010    ,LAD 50% PROX,605 TANDEM SEGMENTAL STENOSIS,LEFT CIRC 60%-AV GROOVE, RGT COR 40% PROX ON THE BEND ,80% PROX PDA,  . [place] doppler  08/07/2010    left popliteal occlusion ,and anterior tibial complete occlusion  . Pv angiogram  08/08/2010    successful PTA LEFT POPLITEAL    FAMHx:    Family History  Problem Relation Age of Onset  . Cancer Mother   . Cancer Father   . CAD Father   . Heart attack Father 67    first MI, 2 major after that, multiple silent MIs  . Stroke Brother   . CAD Brother 54    first stent  . Heart attack Brother   . CAD Brother 46    CABG   . Hypertension Brother   . Heart attack Brother 76    died from MI    SOCHx:   reports that she has never smoked. She has never used smokeless tobacco. She reports that she drinks alcohol. She reports that she does not use illicit drugs.  ALLERGIES:  Allergies  Allergen Reactions  . Niaspan [Niacin Er] Other (See Comments)    Flushing, even with ASA    ROS: Pertinent items noted in HPI and remainder of comprehensive ROS otherwise negative.  HOME MEDS: Current Outpatient Prescriptions  Medication Sig Dispense  Refill  . acetaminophen (TYLENOL) 500 MG tablet Take 2,000 mg by mouth every 6 (six) hours as needed for pain.    Marland Kitchen aspirin EC 81 MG tablet Take 81 mg by mouth daily.    . furosemide (LASIX) 40 MG tablet Take 0.5 tablets (20 mg total) by mouth daily. 45 tablet 3  . glimepiride (AMARYL) 1 MG tablet Take 1 mg by mouth daily.  3  . levothyroxine (SYNTHROID, LEVOTHROID) 50 MCG tablet Take 50 mcg by mouth daily before breakfast.    . metFORMIN (GLUMETZA) 500 MG (MOD) 24 hr tablet Take 1,000 mg by mouth 2 (two) times daily with a meal.    . metoprolol succinate (TOPROL-XL) 100 MG 24 hr tablet Take 1 tablet (100 mg total) by mouth daily. Take with or immediately following a meal. 90 tablet 3  . ramipril (ALTACE) 5 MG capsule Take 5 mg by mouth daily.    . rosuvastatin (CRESTOR) 40 MG tablet Take 1 tablet (40 mg total) by mouth daily. 90 tablet 3  . warfarin (COUMADIN) 2 MG tablet Take 1 to 1.5 tablets by mouth daily as directed by coumadin clinic 120 tablet 1  . zaleplon (SONATA) 5 MG capsule Take 5 mg by mouth at bedtime as needed.     No current facility-administered medications for this visit.     LABS/IMAGING: No results found for this or any previous visit (from the past 48 hour(s)). No results found.  VITALS: BP 162/79 mmHg  Pulse 88  Ht 5\' 3"  (1.6 m)  Wt 180 lb 3.2 oz (81.738 kg)  BMI 31.93 kg/m2  EXAM: General appearance: alert and no distress Neck: no adenopathy, no carotid bruit, no JVD, supple, symmetrical, trachea midline and thyroid not enlarged, symmetric, no tenderness/mass/nodules Lungs: clear to auscultation bilaterally Heart: regular rate and rhythm, S1, S2 normal, no murmur, click, rub or gallop and Sharp mechanical valve sound Abdomen: soft, non-tender; bowel sounds normal; no masses,  no organomegaly Extremities: extremities normal, atraumatic, no cyanosis or edema Pulses: 2+ and symmetric Skin: Skin color, texture, turgor normal. No rashes or lesions Neurologic: Grossly normal  EKG: Sinus rhythm at 88, left bundle branch block  ASSESSMENT: 1. Coronary artery disease status post 3 vessel CABG (LIMA to LAD, SVG to circumflex, SVG to PDA) 2. Severe aortic stenosis and a bicuspid aortic valve, status post 19 mm St. Jude mechanical aortic valve excellent recent left popliteal artery occlusion with rest pain status post angioplasty 3. Pulmonary emboli associated with DVT 4. Ischemic cardiomyopathy, EF now normalized at 55-60% 5. Hypertension - at goal 6. Dyslipidemia - needs rechecked 7. Diabetes type 2 - controlled 8. LBBB   PLAN: 1.   Mrs. Goossen is feeling the best that she has in years. Her numbers are much better controlled including her diabetes. Blood pressure is a little elevated today however at home she says is very well controlled. Cholesterol is no longer a goal she did not qualify for ongoing therapy with Praluent and it was not affordable. I think she would be a great candidate for Repatha. She has documented ASCVD and likely FH based on very high LDL >200 prior to max therapy on high dose statin and zetia. Will start prior authorization for  Repatha.   Follow-up in lipid clinic and then with me in 6 months.  Pixie Casino, MD, [facility] Attending Cardiologist [employer] 12/11/2015, 7:59 AM

## 2016-01-11 ENCOUNTER — Ambulatory Visit (INDEPENDENT_AMBULATORY_CARE_PROVIDER_SITE_OTHER): Payer: BLUE CROSS/BLUE SHIELD | Admitting: Pharmacist

## 2016-01-11 VITALS — Wt 181.0 lb

## 2016-01-11 DIAGNOSIS — Z952 Presence of prosthetic heart valve: Secondary | ICD-10-CM

## 2016-01-11 DIAGNOSIS — Z7901 Long term (current) use of anticoagulants: Secondary | ICD-10-CM | POA: Diagnosis not present

## 2016-01-11 DIAGNOSIS — Z954 Presence of other heart-valve replacement: Secondary | ICD-10-CM | POA: Diagnosis not present

## 2016-01-11 LAB — POCT INR: INR: 2.9

## 2016-01-13 NOTE — Progress Notes (Signed)
. Patient ID: Katrina Obrien                 DOB: 1955/11/20                    MRN: [id number]     HPI: Katrina Obrien is a 60 y.o. female patient of Dr Rennis Golden, who presents today for a lipid clinic re-evaluation. Her cardiac history includes an MI, left popliteal artery thrombus, PE, severe aortic stenosis, EF of 40-45% and diabetes. She had CABG x 3 and mechanical AVR placed.   She has previously tried [employer] with great results (LDL down to 20 from 100s on statin), but unfortunately her insurance would no longer pay for the medication. She now has new insurance.   RF: HL, ASCVD, DM  Meds: Crestor 40 mg daily  Intolerant: niacin - flushing  Family history: Father had first MI at age 32, had 2 other major and multiple silent MIs afterward. 3 brothers, one had stent at ager 87, one had CABG at 25, third died from MI at age 40. Sister and mother have no heart disease  Diet: Tries to follow healthy diet mix of fruits/vegetables and lean meats.   Labs:  TC  166  TG 145  HDL 35  LDL 102 (up from 20 1 year ago on Praluent)  Past Medical History  Diagnosis Date  . Diabetes mellitus without complication ([facility])   . Coronary artery disease     s/p 3- vessel bypass   . Ischemic cardiomyopathy     40 to 45%  . Hyperlipidemia   . Hypertension   . Pulmonary embolism ([facility])     associated with DVT  . Aortic valve stenosis, severe     and bbicusipid aortic valve status post 19-mm St Jude mechanical aortic valve replacement  . Popliteal artery occlusion, left ([facility])     recent  status  post angioplasty  . Heart murmur     Current Outpatient Prescriptions on File Prior to Visit  Medication Sig Dispense Refill  . acetaminophen (TYLENOL) 500 MG tablet Take 2,000 mg by mouth every 6 (six) hours as needed for pain.    Marland Kitchen aspirin EC 81 MG tablet Take 81 mg by mouth daily.    . furosemide (LASIX) 40 MG tablet Take 0.5 tablets (20 mg total) by mouth daily. 45 tablet 3  . glimepiride  (AMARYL) 1 MG tablet Take 1 mg by mouth daily.  3  . levothyroxine (SYNTHROID, LEVOTHROID) 50 MCG tablet Take 50 mcg by mouth daily before breakfast.    . metFORMIN (GLUMETZA) 500 MG (MOD) 24 hr tablet Take 1,000 mg by mouth 2 (two) times daily with a meal.    . metoprolol succinate (TOPROL-XL) 100 MG 24 hr tablet Take 1 tablet (100 mg total) by mouth daily. Take with or immediately following a meal. 90 tablet 3  . ramipril (ALTACE) 5 MG capsule Take 5 mg by mouth daily.    . rosuvastatin (CRESTOR) 40 MG tablet Take 1 tablet (40 mg total) by mouth daily. 90 tablet 3  . warfarin (COUMADIN) 2 MG tablet Take 1 to 1.5 tablets by mouth daily as directed by coumadin clinic 120 tablet 1  . zaleplon (SONATA) 5 MG capsule Take 5 mg by mouth at bedtime as needed.     No current facility-administered medications on file prior to visit.    Allergies  Allergen Reactions  . Niaspan [Niacin Er] Other (See Comments)  Flushing, even with ASA    Assessment/Plan: Hyperlipidemia: Will pursue PCSK9i approval through new insurance. If unable to get approved will send information to patient assistance as she has significant cardiac history and we would really like to have her on therapy to get her LDL to goal <70.   Thank you, Freddie Apley. Cleatis Polka, PharmD  [facility]  [address] [place], [place] [address]  Phone: [phone]; Fax: [phone] 01/13/2016 7:22 PM

## 2016-01-14 ENCOUNTER — Ambulatory Visit (HOSPITAL_COMMUNITY): Payer: BLUE CROSS/BLUE SHIELD | Attending: Internal Medicine

## 2016-01-14 ENCOUNTER — Other Ambulatory Visit: Payer: Self-pay

## 2016-01-14 DIAGNOSIS — Z954 Presence of other heart-valve replacement: Secondary | ICD-10-CM

## 2016-01-14 DIAGNOSIS — I313 Pericardial effusion (noninflammatory): Secondary | ICD-10-CM | POA: Insufficient documentation

## 2016-01-14 DIAGNOSIS — Z952 Presence of prosthetic heart valve: Secondary | ICD-10-CM

## 2016-01-14 DIAGNOSIS — I517 Cardiomegaly: Secondary | ICD-10-CM | POA: Diagnosis not present

## 2016-01-14 DIAGNOSIS — I2510 Atherosclerotic heart disease of native coronary artery without angina pectoris: Secondary | ICD-10-CM | POA: Insufficient documentation

## 2016-01-14 DIAGNOSIS — I359 Nonrheumatic aortic valve disorder, unspecified: Secondary | ICD-10-CM | POA: Diagnosis present

## 2016-01-14 DIAGNOSIS — E119 Type 2 diabetes mellitus without complications: Secondary | ICD-10-CM | POA: Insufficient documentation

## 2016-01-14 DIAGNOSIS — I340 Nonrheumatic mitral (valve) insufficiency: Secondary | ICD-10-CM | POA: Diagnosis not present

## 2016-01-14 LAB — ECHOCARDIOGRAM COMPLETE
AOPV: 0.3 m/s
AV Area VTI index: 0.61 cm2/m2
AV Mean grad: 13 mmHg
AV Peak grad: 21 mmHg
AV pk vel: 231 cm/s
AV vel: 1.13
AVAREAMEANV: 1.07 cm2
AVAREAMEANVIN: 0.58 cm2/m2
AVAREAVTI: 1.04 cm2
AVCELMEANRAT: 0.31
AVPHT: 243 ms
CHL CUP AV PEAK INDEX: 0.56
CHL CUP DOP CALC LVOT VTI: 16.3 cm
CHL CUP MV DEC (S): 338
DOP CAL AO MEAN VELOCITY: 167 cm/s
E decel time: 338 msec
E/e' ratio: 20.58
FS: 31 % (ref 28–44)
IVS/LV PW RATIO, ED: 0.95
LA diam end sys: 40 mm
LA vol A4C: 52 ml
LA vol: 44 mL
LADIAMINDEX: 2.16 cm/m2
LASIZE: 40 mm
LAVOLIN: 23.8 mL/m2
LDCA: 3.46 cm2
LV E/e'average: 20.58
LV PW d: 11.2 mm — AB (ref 0.6–1.1)
LV TDI E'LATERAL: 5.15
LV TDI E'MEDIAL: 5.37
LV e' LATERAL: 5.15 cm/s
LVEEMED: 20.58
LVOT SV: 56 mL
LVOTD: 21 mm
LVOTPV: 69.2 cm/s
LVOTVTI: 0.33 cm
MV Peak grad: 4 mmHg
MVPKAVEL: 93.8 m/s
MVPKEVEL: 106 m/s
Reg peak vel: 237 cm/s
TRMAXVEL: 237 cm/s
VTI: 49.9 cm
Valve area index: 0.61
Valve area: 1.13 cm2

## 2016-01-15 ENCOUNTER — Encounter: Payer: Self-pay | Admitting: Pharmacist

## 2016-01-23 ENCOUNTER — Telehealth: Payer: Self-pay | Admitting: Pharmacist

## 2016-01-23 NOTE — Telephone Encounter (Signed)
Attempted to call patient about approval of Repatha through insurance - will try copay card as well. Will need to send prescription to specialty pharmacy ([facility]).

## 2016-01-31 ENCOUNTER — Telehealth: Payer: Self-pay | Admitting: Pharmacist

## 2016-01-31 MED ORDER — EVOLOCUMAB 140 MG/ML ~~LOC~~ SOAJ: 140.0000 mg | SUBCUTANEOUS | Status: DC

## 2016-01-31 NOTE — Telephone Encounter (Signed)
Rx sent to insurance preferred specialty pharmacy.  LMOM that rx sent and she may be eligible for $5 copay card. Instructed her to call with questions otherwise we can give her copay card at next INR check.

## 2016-02-04 ENCOUNTER — Telehealth: Payer: Self-pay | Admitting: Internal Medicine

## 2016-02-04 NOTE — Telephone Encounter (Signed)
Returned call to [facility] for ICD code for Katrina Obrien. ICD code confirmed

## 2016-02-04 NOTE — Telephone Encounter (Signed)
Routed to clinical pharmacy staff

## 2016-02-04 NOTE — Telephone Encounter (Signed)
New Message   Nadya of [facility] called for ICD code for repatha. Please call.

## 2016-02-11 ENCOUNTER — Ambulatory Visit (INDEPENDENT_AMBULATORY_CARE_PROVIDER_SITE_OTHER): Payer: BLUE CROSS/BLUE SHIELD | Admitting: Pharmacist Clinician (PhC)/ Clinical Pharmacy Specialist

## 2016-02-11 DIAGNOSIS — Z7901 Long term (current) use of anticoagulants: Secondary | ICD-10-CM

## 2016-02-11 DIAGNOSIS — Z954 Presence of other heart-valve replacement: Secondary | ICD-10-CM

## 2016-02-11 DIAGNOSIS — Z952 Presence of prosthetic heart valve: Secondary | ICD-10-CM

## 2016-02-11 LAB — POCT INR: INR: 3

## 2016-02-11 MED ORDER — EVOLOCUMAB 140 MG/ML ~~LOC~~ SOAJ: 140.0000 mg | SUBCUTANEOUS | Status: DC

## 2016-03-24 ENCOUNTER — Ambulatory Visit (INDEPENDENT_AMBULATORY_CARE_PROVIDER_SITE_OTHER): Payer: BLUE CROSS/BLUE SHIELD | Admitting: Pharmacist Clinician (PhC)/ Clinical Pharmacy Specialist

## 2016-03-24 DIAGNOSIS — Z954 Presence of other heart-valve replacement: Secondary | ICD-10-CM | POA: Diagnosis not present

## 2016-03-24 DIAGNOSIS — Z952 Presence of prosthetic heart valve: Secondary | ICD-10-CM

## 2016-03-24 DIAGNOSIS — Z7901 Long term (current) use of anticoagulants: Secondary | ICD-10-CM | POA: Diagnosis not present

## 2016-03-24 LAB — POCT INR: INR: 2.2

## 2016-04-14 ENCOUNTER — Ambulatory Visit (INDEPENDENT_AMBULATORY_CARE_PROVIDER_SITE_OTHER): Payer: BLUE CROSS/BLUE SHIELD | Admitting: Pharmacist Clinician (PhC)/ Clinical Pharmacy Specialist

## 2016-04-14 DIAGNOSIS — Z7901 Long term (current) use of anticoagulants: Secondary | ICD-10-CM

## 2016-04-14 DIAGNOSIS — Z952 Presence of prosthetic heart valve: Secondary | ICD-10-CM

## 2016-04-14 DIAGNOSIS — Z954 Presence of other heart-valve replacement: Secondary | ICD-10-CM

## 2016-04-14 LAB — POCT INR: INR: 2.8

## 2016-05-12 ENCOUNTER — Ambulatory Visit (INDEPENDENT_AMBULATORY_CARE_PROVIDER_SITE_OTHER): Payer: BLUE CROSS/BLUE SHIELD | Admitting: Pharmacist

## 2016-05-12 DIAGNOSIS — Z7901 Long term (current) use of anticoagulants: Secondary | ICD-10-CM

## 2016-05-12 DIAGNOSIS — Z952 Presence of prosthetic heart valve: Secondary | ICD-10-CM | POA: Diagnosis not present

## 2016-05-12 LAB — POCT INR: INR: 2.3

## 2016-05-13 ENCOUNTER — Other Ambulatory Visit: Payer: Self-pay | Admitting: Internal Medicine

## 2016-06-09 ENCOUNTER — Ambulatory Visit (INDEPENDENT_AMBULATORY_CARE_PROVIDER_SITE_OTHER): Payer: BLUE CROSS/BLUE SHIELD | Admitting: Pharmacist Clinician (PhC)/ Clinical Pharmacy Specialist

## 2016-06-09 DIAGNOSIS — E785 Hyperlipidemia, unspecified: Secondary | ICD-10-CM

## 2016-06-09 DIAGNOSIS — Z7901 Long term (current) use of anticoagulants: Secondary | ICD-10-CM | POA: Diagnosis not present

## 2016-06-09 DIAGNOSIS — Z952 Presence of prosthetic heart valve: Secondary | ICD-10-CM

## 2016-06-09 LAB — POCT INR: INR: 2.4

## 2016-06-18 ENCOUNTER — Other Ambulatory Visit: Payer: Self-pay | Admitting: Internal Medicine

## 2016-06-19 LAB — LIPID PANEL
CHOL/HDL RATIO: 2.2 ratio (ref <5.0)
CHOLESTEROL: 91 mg/dL (ref <200)
HDL: 41 mg/dL — AB (ref >50)
LDL Cholesterol: 18 mg/dL (ref <100)
Triglycerides: 159 mg/dL — ABNORMAL HIGH (ref <150)
VLDL: 32 mg/dL — ABNORMAL HIGH (ref <30)

## 2016-06-23 ENCOUNTER — Ambulatory Visit (INDEPENDENT_AMBULATORY_CARE_PROVIDER_SITE_OTHER): Payer: BLUE CROSS/BLUE SHIELD | Admitting: Pharmacist Clinician (PhC)/ Clinical Pharmacy Specialist

## 2016-06-23 DIAGNOSIS — Z952 Presence of prosthetic heart valve: Secondary | ICD-10-CM | POA: Diagnosis not present

## 2016-06-23 DIAGNOSIS — Z7901 Long term (current) use of anticoagulants: Secondary | ICD-10-CM

## 2016-06-23 LAB — POCT INR: INR: 2.7

## 2016-07-15 ENCOUNTER — Encounter: Payer: Self-pay | Admitting: Internal Medicine

## 2016-07-15 ENCOUNTER — Ambulatory Visit (INDEPENDENT_AMBULATORY_CARE_PROVIDER_SITE_OTHER): Payer: BLUE CROSS/BLUE SHIELD | Admitting: Pharmacist Clinician (PhC)/ Clinical Pharmacy Specialist

## 2016-07-15 ENCOUNTER — Ambulatory Visit (INDEPENDENT_AMBULATORY_CARE_PROVIDER_SITE_OTHER): Payer: BLUE CROSS/BLUE SHIELD | Admitting: Internal Medicine

## 2016-07-15 VITALS — BP 138/74 | HR 82 | Ht 63.0 in | Wt 182.4 lb

## 2016-07-15 DIAGNOSIS — Z952 Presence of prosthetic heart valve: Secondary | ICD-10-CM

## 2016-07-15 DIAGNOSIS — Z7901 Long term (current) use of anticoagulants: Secondary | ICD-10-CM | POA: Diagnosis not present

## 2016-07-15 DIAGNOSIS — E784 Other hyperlipidemia: Secondary | ICD-10-CM

## 2016-07-15 DIAGNOSIS — Z951 Presence of aortocoronary bypass graft: Secondary | ICD-10-CM

## 2016-07-15 DIAGNOSIS — I447 Left bundle-branch block, unspecified: Secondary | ICD-10-CM

## 2016-07-15 DIAGNOSIS — E7849 Other hyperlipidemia: Secondary | ICD-10-CM

## 2016-07-15 LAB — POCT INR: INR: 2.9

## 2016-07-15 NOTE — Patient Instructions (Signed)
Your physician wants you to follow-up in: 6 months with Dr. Hilty. You will receive a reminder letter in the mail two months in advance. If you don't receive a letter, please call our office to schedule the follow-up appointment.    

## 2016-07-15 NOTE — Progress Notes (Signed)
Chief Complaint:  No complaints  Primary Care Physician: Delia Chimes, NP  HPI:  Katrina Obrien is a 60 year old female who has a history of an unfortunate episode with left popliteal artery thrombus, PE, severe aortic stenosis which was ultimately diagnosed in a bicuspid aortic valve, as well as multivessel coronary disease, anterior MI, EF about 40% to 45% - all which came to a head about the same time. She underwent 3-vessel bypass as well as mechanical aortic valve replacement. She has done well on Coumadin and was ultimately diagnosed with diabetes and is followed by Dr. Chalmers Cater for this. Now she is markedly improved, although she has an ischemic cardiomyopathy with an EF of 40% to 45%. Her activity level is pretty good.  We recently performed repeat lipid testing, which demonstrated an elevated LDL particle number of 2163. The calculated LDL was 80. I asked her to increase her Crestor to 40 mg at night and add 500 mg Niaspan. She reports significant flushing and intolerance to the Niaspan, despite taking aspirin 30 minutes prior.  Her laboratory work does look improved, with her particle number being reduced to 1547, LDL 55, HDL of 39.  She does report however worsening pain in her capsule when she walks that works its way up her legs. This is nothing as significant as when she was originally diagnosed with her acute arterial thrombus, but has concerns for claudication. Alternatively, this could represent myalgias from her statin.  Mrs. Fazel returns today for followup. She is doing extremely well. She continues to be active and has no complaints of chest pain worsening shortness of breath. She does feel like her legs tire easily however this could be improved with more exercise. She denies any symptoms of claudication. Her INR has been therapeutic. She reports good blood sugar control and has an appointment with her endocrinologist in the next couple of weeks. We recently obtained an  echocardiogram which showed normalization of her EF to 55-60% in March of 2015. She's also had lower extremity arterial Dopplers which show preserved ABIs.  I the pleasure seeing Salmah back in the office today. She reports doing fairly well except occasionally she gets somewhat "swimmy headed". This is worse with change of position. As previously noted her EF has improved back to 55-60%. Recently she's had persistently elevated lipoprotein particles including an LDL particle number in the 1900s. This is despite being on Zetia and Crestor 40 mg. Based on this I recommended that she start on Pralulent. She's been taking this now for just over a month and is having no problems with injection site reactions or side effects. We will plan to check her cholesterol again in about 2 months.  Mrs. Comfort was seen in the office today in follow-up. Overall she seems to be doing very well. 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 She will be due for repeat cholesterol test after the first of the year. She denies any chest pain or worsening shortness of breath. She is complaining of some occasional swelling in her lower extremities which improves in the morning. She also gets some pain in her right leg. She is overdue for repeat lower extremity Dopplers and does have a history of course of arterial thrombus in the leg. Warfarin has been therapeutic and managed by the Coumadin clinic. Cholesterol is still not at goal despite max dose therapy. For brief period of time she was on samples of Repatha (PCSK9 inhibitor) with a marked response in cholesterol, however insurance  would not cover this.  12/10/2015  Mrs. Andreen returns today for follow-up. She says this is the best she has felt some time. She denies any chest pain or worsening shortness of breath. Recently her cholesterol numbers have risen with LDL now 102. This would put her not at goal therapy. Given her multiple comorbidities. Recent data indicates the fact  that driving LDL below 50 may improve cardiovascular outcomes. This was with the use of maximal statin therapy as well as the addition of a PCSK9 inhibitor, specifically Repatha. In fact, she had responded very well to Pralulent in the past, with LDL as low as 20 however that was not covered by insurance. 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 Aggressive therapy is warranted.  07/15/2016  Sarye returns today for follow-up. Overall she feels very well. She had one episode of intense burning in her chest which happened a few weeks ago. It was worse when she was laying down at night. She did not take any medicine for that she was concerned she could take it with her other medicines. It sounds like reflux she's not had any more symptoms like that. I told her she could use Tums or an H2 blocker for that as needed. She recently went on to [place] and her LDL cholesterol is now 19. This should be a significant benefit in slowing her overall cardiovascular risks. As mentioned previously she had an echo this past year which showed normal LVEF 55-60% and a normally functioning mechanical aortic valve. Her INR was therapeutic today.  PMHx:  Past Medical History:  Diagnosis Date  . 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 Coronary artery disease    s/p 3- vessel bypass   . Diabetes mellitus without complication ([place])   . Heart murmur   . Hyperlipidemia   . Hypertension   . Ischemic cardiomyopathy    40 to 45%  . Popliteal artery occlusion, left ([facility])    recent  status  post angioplasty  . Pulmonary embolism ([place])    associated with DVT    Past Surgical History:  Procedure Laterality Date  . AORTIC VALVE REPLACEMENT (AVR)/CORONARY ARTERY BYPASS GRAFTING (CABG)  08/16/2010  VAN TRIGT   AV replacement w/19-mm mechanical  St Jude valve ([id number]),CABGX3  lima to LAD, SVG to distal circ,SVG to posterior descending  . ASD REPAIR    . CARDIAC CATHETERIZATION  08/08/2010   ,LAD 50% PROX,605 TANDEM SEGMENTAL STENOSIS,LEFT CIRC 60%-AV GROOVE, RGT COR 40% PROX ON THE BEND ,80% PROX PDA,  . CORONARY ARTERY BYPASS GRAFT  08/16/2010   LIMA TO lad,svg to distal circ ,svg to posterior descending  . DOPPLER ECHOCARDIOGRAPHY  08/09/2010   EF35% to 40% ,bbicuspid ;severely  thickened ,severely calcified leaflets, LV normal  . LEA doppler  08/07/2010   left popliteal occlusion ,and anterior tibial complete occlusion  . NM MYOCAR PERF WALL MOTION  08/06/2010   EF 31%  LOW RISK SCAN  . nuc    . PV ANGIOGRAM  08/08/2010   successful PTA LEFT POPLITEAL    FAMHx:  Family History  Problem Relation Age of Onset  . Cancer Mother   . Cancer Father   . CAD Father   . Heart attack Father 63    first MI, 2 major after that, multiple silent MIs  . Stroke Brother   .  CAD Brother 53    first stent  . Heart attack Brother   . CAD Brother 15    CABG   . Hypertension Brother   . Heart attack Brother 61    died from MI    SOCHx:   reports that she has never smoked. She has never used smokeless tobacco. She reports that she drinks alcohol. She reports that she does not use drugs.  ALLERGIES:  Allergies  Allergen Reactions  . Niaspan [Niacin Er] Other (See Comments)    Flushing, even with ASA    ROS: Pertinent items noted in HPI and remainder of comprehensive ROS otherwise negative.  HOME MEDS: Current Outpatient Prescriptions  Medication Sig Dispense Refill  . acetaminophen (TYLENOL) 500 MG tablet Take 2,000 mg by mouth every 6 (six) hours as needed for pain.    Marland Kitchen aspirin EC 81 MG tablet Take 81 mg by mouth daily.    . Evolocumab (REPATHA SURECLICK) [id number] MG/ML SOAJ Inject 140 mg into the skin every 14 (fourteen) days. 2 pen 6  . furosemide (LASIX) 40 MG tablet Take 0.5 tablets (20 mg total) by mouth daily. 45 tablet  3  . glimepiride (AMARYL) 1 MG tablet Take 2 mg by mouth daily.   3  . levothyroxine (SYNTHROID, LEVOTHROID) 50 MCG tablet Take 50 mcg by mouth daily before breakfast.    . metFORMIN (GLUMETZA) 500 MG (MOD) 24 hr tablet Take 500 mg by mouth daily with breakfast.     . metoprolol succinate (TOPROL-XL) 100 MG 24 hr tablet Take 1 tablet (100 mg total) by mouth daily. Take with or immediately following a meal. 90 tablet 3  . ramipril (ALTACE) 5 MG capsule Take 5 mg by mouth daily.    . rosuvastatin (CRESTOR) 40 MG tablet Take 1 tablet (40 mg total) by mouth daily. 90 tablet 3  . warfarin (COUMADIN) 2 MG tablet TAKE 1 TO 1 AND 1/2 TABLETS BY MOUTH DAILY AS DIRECTED BY COUMADIN CLINIC. 120 tablet 1  . zaleplon (SONATA) 5 MG capsule Take 5 mg by mouth at bedtime as needed.     No current facility-administered medications for this visit.     LABS/IMAGING: No results found for this or any previous visit (from the past 48 hour(s)). No results found.  VITALS: BP 138/74   Pulse 82   Ht 5\' 3"  (1.6 m)   Wt 182 lb 6.4 oz (82.7 kg)   LMP  (LMP Unknown)   BMI 32.31 kg/m   EXAM: General appearance: alert and no distress Neck: no adenopathy, no carotid bruit, no JVD, supple, symmetrical, trachea midline and thyroid not enlarged, symmetric, no tenderness/mass/nodules Lungs: clear to auscultation bilaterally Heart: regular rate and rhythm, S1, S2 normal, no murmur, click, rub or gallop and Sharp mechanical valve sound Abdomen: soft, non-tender; bowel sounds normal; no masses,  no organomegaly Extremities: extremities normal, atraumatic, no cyanosis or edema Pulses: 2+ and symmetric Skin: Skin color, texture, turgor normal. No rashes or lesions Neurologic: Grossly normal  EKG: Sinus rhythm at 82, left bundle branch block  ASSESSMENT: 1. Coronary artery disease status post 3 vessel CABG (LIMA to LAD, SVG to circumflex, SVG to PDA) 2. Severe aortic stenosis and a bicuspid aortic valve, status post  19 mm St. Jude mechanical aortic valve excellent recent left popliteal artery occlusion with rest pain status post angioplasty 3. Pulmonary emboli associated with DVT 4. Ischemic cardiomyopathy, EF now normalized at 55-60% 5. Hypertension - at goal 6. Dyslipidemia - needs rechecked  7. Diabetes type 2 - controlled 8. LBBB   PLAN: 1.   Mrs. Rayne is feeling the best that she has in years. Her numbers are much better controlled including her diabetes. Blood pressure is a little elevated today however at home she says is very well controlled. Her cholesterol is now much better controlled on the past with an LDL of 19. EF is stable at 55-60% with a normally functioning mechanical valve. She did have one episode of chest discomfort which sounds more like acid reflux. Advised her she could take Tums or an H2 blocker as needed. It has been 5 years since her bypass surgery. By screening guidelines, she is a candidate for repeat stress testing. We'll discuss this in 6 months when I see her back.  Follow-up with me in 6 months.  Pixie Casino, MD, [facility] Attending Cardiologist [place] 07/15/2016, 8:50 AM

## 2016-08-11 ENCOUNTER — Other Ambulatory Visit: Payer: Self-pay | Admitting: Internal Medicine

## 2016-08-11 NOTE — Telephone Encounter (Signed)
Rx(s) sent to pharmacy electronically.  

## 2016-08-25 ENCOUNTER — Ambulatory Visit (INDEPENDENT_AMBULATORY_CARE_PROVIDER_SITE_OTHER): Payer: BLUE CROSS/BLUE SHIELD | Admitting: Pharmacist Clinician (PhC)/ Clinical Pharmacy Specialist

## 2016-08-25 DIAGNOSIS — Z952 Presence of prosthetic heart valve: Secondary | ICD-10-CM

## 2016-08-25 DIAGNOSIS — Z7901 Long term (current) use of anticoagulants: Secondary | ICD-10-CM | POA: Diagnosis not present

## 2016-08-25 LAB — POCT INR: INR: 3.6

## 2016-10-06 ENCOUNTER — Ambulatory Visit (INDEPENDENT_AMBULATORY_CARE_PROVIDER_SITE_OTHER): Payer: BLUE CROSS/BLUE SHIELD | Admitting: Pharmacist Clinician (PhC)/ Clinical Pharmacy Specialist

## 2016-10-06 DIAGNOSIS — Z7901 Long term (current) use of anticoagulants: Secondary | ICD-10-CM | POA: Diagnosis not present

## 2016-10-06 DIAGNOSIS — Z952 Presence of prosthetic heart valve: Secondary | ICD-10-CM | POA: Diagnosis not present

## 2016-10-06 LAB — POCT INR: INR: 2.3

## 2016-11-03 ENCOUNTER — Ambulatory Visit (INDEPENDENT_AMBULATORY_CARE_PROVIDER_SITE_OTHER): Payer: BLUE CROSS/BLUE SHIELD | Admitting: Pharmacist

## 2016-11-03 DIAGNOSIS — Z7901 Long term (current) use of anticoagulants: Secondary | ICD-10-CM | POA: Diagnosis not present

## 2016-11-03 DIAGNOSIS — Z952 Presence of prosthetic heart valve: Secondary | ICD-10-CM

## 2016-11-03 LAB — POCT INR: INR: 3.4

## 2016-12-11 ENCOUNTER — Ambulatory Visit (INDEPENDENT_AMBULATORY_CARE_PROVIDER_SITE_OTHER): Payer: BLUE CROSS/BLUE SHIELD | Admitting: Pharmacist

## 2016-12-11 ENCOUNTER — Telehealth: Payer: Self-pay | Admitting: Pharmacist

## 2016-12-11 DIAGNOSIS — Z952 Presence of prosthetic heart valve: Secondary | ICD-10-CM

## 2016-12-11 DIAGNOSIS — Z7901 Long term (current) use of anticoagulants: Secondary | ICD-10-CM | POA: Diagnosis not present

## 2016-12-11 LAB — POCT INR: INR: 3.4

## 2016-12-11 NOTE — Telephone Encounter (Signed)
Medication Samples have been provided to the patient.  Drug name: Repatha SureClick    Strength: 140mg         Qty: 1  LOT: [id number]    Exp.Date: 06/20  Dosing instructions: SubQ x 1 dose today  The patient has been instructed regarding the correct time, dose, and frequency of taking this medication, including desired effects and most common side effects.   Jazzman Loughmiller N Rodriguez-Guzman 3:44 PM 12/11/2016

## 2017-01-22 ENCOUNTER — Other Ambulatory Visit: Payer: Self-pay | Admitting: Pharmacist Clinician (PhC)/ Clinical Pharmacy Specialist

## 2017-01-22 ENCOUNTER — Ambulatory Visit (INDEPENDENT_AMBULATORY_CARE_PROVIDER_SITE_OTHER): Payer: BC Managed Care – PPO | Admitting: Pharmacist Clinician (PhC)/ Clinical Pharmacy Specialist

## 2017-01-22 DIAGNOSIS — Z7901 Long term (current) use of anticoagulants: Secondary | ICD-10-CM

## 2017-01-22 DIAGNOSIS — E785 Hyperlipidemia, unspecified: Secondary | ICD-10-CM

## 2017-01-22 DIAGNOSIS — Z952 Presence of prosthetic heart valve: Secondary | ICD-10-CM

## 2017-01-22 LAB — POCT INR: INR: 3.6

## 2017-01-22 MED ORDER — EVOLOCUMAB 140 MG/ML ~~LOC~~ SOAJ: 140.0000 mg | SUBCUTANEOUS | 12 refills | Status: DC

## 2017-01-27 ENCOUNTER — Other Ambulatory Visit: Payer: Self-pay | Admitting: *Deleted

## 2017-01-27 DIAGNOSIS — E785 Hyperlipidemia, unspecified: Secondary | ICD-10-CM

## 2017-01-27 LAB — LIPID PANEL
CHOLESTEROL TOTAL: 187 mg/dL (ref 100–199)
Chol/HDL Ratio: 4.8 ratio — ABNORMAL HIGH (ref 0.0–4.4)
HDL: 39 mg/dL — ABNORMAL LOW (ref >39)
LDL CALC: 110 mg/dL — AB (ref 0–99)
Triglycerides: 188 mg/dL — ABNORMAL HIGH (ref 0–149)
VLDL Cholesterol Cal: 38 mg/dL (ref 5–40)

## 2017-02-16 ENCOUNTER — Telehealth: Payer: Self-pay | Admitting: Internal Medicine

## 2017-02-16 NOTE — Telephone Encounter (Signed)
New message     Fax [phone] needs you to fax prescription for the Repatha to them also  Pt has been approved for the Repatha until 02/12/2018 her copay will be $47 an can only be filled at [facility]

## 2017-02-18 NOTE — Telephone Encounter (Signed)
Follow up    Called again , needs prescription faxed to them

## 2017-02-18 NOTE — Telephone Encounter (Signed)
rx faxed to given number

## 2017-03-03 ENCOUNTER — Ambulatory Visit (INDEPENDENT_AMBULATORY_CARE_PROVIDER_SITE_OTHER): Payer: BC Managed Care – PPO | Admitting: Pharmacist Clinician (PhC)/ Clinical Pharmacy Specialist

## 2017-03-03 DIAGNOSIS — Z7901 Long term (current) use of anticoagulants: Secondary | ICD-10-CM

## 2017-03-03 DIAGNOSIS — Z952 Presence of prosthetic heart valve: Secondary | ICD-10-CM | POA: Diagnosis not present

## 2017-03-03 LAB — POCT INR: INR: 2.2

## 2017-03-26 ENCOUNTER — Ambulatory Visit (INDEPENDENT_AMBULATORY_CARE_PROVIDER_SITE_OTHER): Payer: BC Managed Care – PPO | Admitting: Pharmacist

## 2017-03-26 DIAGNOSIS — Z7901 Long term (current) use of anticoagulants: Secondary | ICD-10-CM

## 2017-03-26 DIAGNOSIS — Z952 Presence of prosthetic heart valve: Secondary | ICD-10-CM

## 2017-03-26 DIAGNOSIS — E7800 Pure hypercholesterolemia, unspecified: Secondary | ICD-10-CM

## 2017-03-26 LAB — POCT INR: INR: 2.1

## 2017-03-27 LAB — LIPID PANEL
CHOLESTEROL TOTAL: 136 mg/dL (ref 100–199)
Chol/HDL Ratio: 3.6 ratio (ref 0.0–4.4)
HDL: 38 mg/dL — ABNORMAL LOW (ref >39)
LDL CALC: 28 mg/dL (ref 0–99)
TRIGLYCERIDES: 349 mg/dL — AB (ref 0–149)
VLDL Cholesterol Cal: 70 mg/dL — ABNORMAL HIGH (ref 5–40)

## 2017-04-16 ENCOUNTER — Ambulatory Visit (INDEPENDENT_AMBULATORY_CARE_PROVIDER_SITE_OTHER): Payer: BC Managed Care – PPO | Admitting: Pharmacist

## 2017-04-16 DIAGNOSIS — Z952 Presence of prosthetic heart valve: Secondary | ICD-10-CM

## 2017-04-16 DIAGNOSIS — Z7901 Long term (current) use of anticoagulants: Secondary | ICD-10-CM | POA: Diagnosis not present

## 2017-04-16 LAB — POCT INR: INR: 3

## 2017-04-24 ENCOUNTER — Other Ambulatory Visit: Payer: Self-pay | Admitting: Internal Medicine

## 2017-05-15 ENCOUNTER — Ambulatory Visit (INDEPENDENT_AMBULATORY_CARE_PROVIDER_SITE_OTHER): Payer: BC Managed Care – PPO | Admitting: Pharmacist Clinician (PhC)/ Clinical Pharmacy Specialist

## 2017-05-15 DIAGNOSIS — Z7901 Long term (current) use of anticoagulants: Secondary | ICD-10-CM

## 2017-05-15 DIAGNOSIS — Z952 Presence of prosthetic heart valve: Secondary | ICD-10-CM | POA: Diagnosis not present

## 2017-05-15 LAB — POCT INR: INR: 3.3

## 2017-07-06 ENCOUNTER — Ambulatory Visit: Payer: BC Managed Care – PPO | Admitting: Internal Medicine

## 2017-07-09 ENCOUNTER — Ambulatory Visit (INDEPENDENT_AMBULATORY_CARE_PROVIDER_SITE_OTHER): Payer: BC Managed Care – PPO | Admitting: Pharmacist Clinician (PhC)/ Clinical Pharmacy Specialist

## 2017-07-09 ENCOUNTER — Ambulatory Visit: Payer: BC Managed Care – PPO | Admitting: Internal Medicine

## 2017-07-09 ENCOUNTER — Encounter: Payer: Self-pay | Admitting: Internal Medicine

## 2017-07-09 VITALS — BP 154/76 | HR 86 | Ht 63.0 in | Wt 162.0 lb

## 2017-07-09 DIAGNOSIS — Z951 Presence of aortocoronary bypass graft: Secondary | ICD-10-CM | POA: Diagnosis not present

## 2017-07-09 DIAGNOSIS — E7849 Other hyperlipidemia: Secondary | ICD-10-CM

## 2017-07-09 DIAGNOSIS — Z952 Presence of prosthetic heart valve: Secondary | ICD-10-CM | POA: Diagnosis not present

## 2017-07-09 DIAGNOSIS — I447 Left bundle-branch block, unspecified: Secondary | ICD-10-CM

## 2017-07-09 DIAGNOSIS — Z7901 Long term (current) use of anticoagulants: Secondary | ICD-10-CM | POA: Diagnosis not present

## 2017-07-09 LAB — POCT INR: INR: 2.7

## 2017-07-09 NOTE — Patient Instructions (Signed)
Your physician recommends that you return for lab work in January 2019  Your physician wants you to follow-up in: 6 months with Dr. Debara Pickett. You will receive a reminder letter in the mail two months in advance. If you don't receive a letter, please call our office to schedule the follow-up appointment.

## 2017-07-09 NOTE — Progress Notes (Signed)
Chief Complaint:  Annual follow-up  Primary Care Physician: Patient, No Pcp Per  HPI:  Katrina Obrien is a 61 year old female who has a history of an unfortunate episode with left popliteal artery thrombus, PE, severe aortic stenosis which was ultimately diagnosed in a bicuspid aortic valve, as well as multivessel coronary disease, anterior MI, EF about 40% to 45% - all which came to a head about the same time. She underwent 3-vessel bypass as well as mechanical aortic valve replacement. She has done well on Coumadin and was ultimately diagnosed with diabetes and is followed by Dr. Chalmers Cater for this. Now she is markedly improved, although she has an ischemic cardiomyopathy with an EF of 40% to 45%. Her activity level is pretty good.  We recently performed repeat lipid testing, which demonstrated an elevated LDL particle number of 2163. The calculated LDL was 80. I asked her to increase her Crestor to 40 mg at night and add 500 mg Niaspan. She reports significant flushing and intolerance to the Niaspan, despite taking aspirin 30 minutes prior.  Her laboratory work does look improved, with her particle number being reduced to 1547, LDL 55, HDL of 39.  She does report however worsening pain in her capsule when she walks that works its way up her legs. This is nothing as significant as when she was originally diagnosed with her acute arterial thrombus, but has concerns for claudication. Alternatively, this could represent myalgias from her statin.  Katrina Obrien returns today for followup. She is doing extremely well. She continues to be active and has no complaints of chest pain worsening shortness of breath. She does feel like her legs tire easily however this could be improved with more exercise. She denies any symptoms of claudication. Her INR has been therapeutic. She reports good blood sugar control and has an appointment with her endocrinologist in the next couple of weeks. We recently obtained an  echocardiogram which showed normalization of her EF to 55-60% in March of 2015. She's also had lower extremity arterial Dopplers which show preserved ABIs.  I the pleasure seeing Katrina Obrien back in the office today. She reports doing fairly well except occasionally she gets somewhat "swimmy headed". This is worse with change of position. As previously noted her EF has improved back to 55-60%. Recently she's had persistently elevated lipoprotein particles including an LDL particle number in the 1900s. This is despite being on Zetia and Crestor 40 mg. Based on this I recommended that she start on Pralulent. She's been taking this now for just over a month and is having no problems with injection site reactions or side effects. We will plan to check her cholesterol again in about 2 months.  Katrina Obrien was seen in the office today in follow-up. Overall she seems to be doing very well. 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 She will be due for repeat cholesterol test after the first of the year. She denies any chest pain or worsening shortness of breath. She is complaining of some occasional swelling in her lower extremities which improves in the morning. She also gets some pain in her right leg. She is overdue for repeat lower extremity Dopplers and does have a history of course of arterial thrombus in the leg. Warfarin has been therapeutic and managed by the Coumadin clinic. Cholesterol is still not at goal despite max dose therapy. For brief period of time she was on samples of Repatha (PCSK9 inhibitor) with a marked response in cholesterol,  however insurance would not cover this.  12/10/2015  Katrina Obrien returns today for follow-up. She says this is the best she has felt some time. She denies any chest pain or worsening shortness of breath. Recently her cholesterol numbers have risen with LDL now 102. This would put her not at goal therapy. Given her multiple comorbidities. Recent data indicates the fact  that driving LDL below 50 may improve cardiovascular outcomes. This was with the use of maximal statin therapy as well as the addition of a PCSK9 inhibitor, specifically Repatha. In fact, she had responded very well to Pralulent in the past, with LDL as low as 20 however that was not covered by insurance. 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 Aggressive therapy is warranted.  07/15/2016  Katrina Obrien returns today for follow-up. Overall she feels very well. She had one episode of intense burning in her chest which happened a few weeks ago. It was worse when she was laying down at night. She did not take any medicine for that she was concerned she could take it with her other medicines. It sounds like reflux she's not had any more symptoms like that. I told her she could use Tums or an H2 blocker for that as needed. She recently went on to [place] and her LDL cholesterol is now 19. This should be a significant benefit in slowing her overall cardiovascular risks. As mentioned previously she had an echo this past year which showed normal LVEF 55-60% and a normally functioning mechanical aortic valve. Her INR was therapeutic today.  07/09/2017  Katrina Obrien was seen today in follow-up.  She seems to be doing very well.  She reported that she had to shovel snow for several hours with her son and at the end of that did develop some chest tightness/soreness which went away very quickly.  She said she felt like she overworked herself, but generally has had no symptoms.  Recently we repeated a lipid profile which showed marked improvement in her LDL to 28 on statin, ezetimibe and Repatha.  Her triglycerides were elevated at 349, however she mentioned today that this was nonfasting.  Also her blood sugar was elevated recently.  She was started on Jardiance, which I suspect has made a big difference in her blood sugars.  She says  her hemoglobin A1c now is down to 6.1.  Accordingly, her triglycerides have probably improved as well.  Fortunately, her husband was not with her today.  Apparently he is suffering from depression and has had several bouts.  He is not interested in leaving the house and is not as engaged in activities as he has been in the past.  This is obviously causing her a lot of stress and is not clear to her how she can help him.  PMHx:  Past Medical History:  Diagnosis Date  . 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 Coronary artery disease    s/p 3- vessel bypass   . Diabetes mellitus without complication ([place])   . Heart murmur   . Hyperlipidemia   . Hypertension   . Ischemic cardiomyopathy    40 to 45%  . Popliteal artery occlusion, left ([facility])    recent  status  post angioplasty  . Pulmonary embolism ([place])    associated with DVT    Past Surgical History:  Procedure  Laterality Date  . AORTIC VALVE REPLACEMENT (AVR)/CORONARY ARTERY BYPASS GRAFTING (CABG)  08/16/2010  VAN TRIGT   AV replacement w/19-mm mechanical St Jude valve ([id number]),CABGX3  lima to LAD, SVG to distal circ,SVG to posterior descending  . ASD REPAIR    . CARDIAC CATHETERIZATION  08/08/2010   ,LAD 50% PROX,605 TANDEM SEGMENTAL STENOSIS,LEFT CIRC 60%-AV GROOVE, RGT COR 40% PROX ON THE BEND ,80% PROX PDA,  . CORONARY ARTERY BYPASS GRAFT  08/16/2010   LIMA TO lad,svg to distal circ ,svg to posterior descending  . DOPPLER ECHOCARDIOGRAPHY  08/09/2010   EF35% to 40% ,bbicuspid ;severely  thickened ,severely calcified leaflets, LV normal  . LEA doppler  08/07/2010   left popliteal occlusion ,and anterior tibial complete occlusion  . NM MYOCAR PERF WALL MOTION  08/06/2010   EF 31%  LOW RISK SCAN  . nuc    . PV ANGIOGRAM  08/08/2010   successful PTA LEFT POPLITEAL    FAMHx:  Family History  Problem Relation Age of Onset  . Cancer Mother     . Cancer Father   . CAD Father   . Heart attack Father 97       first MI, 2 major after that, multiple silent MIs  . Stroke Brother   . CAD Brother 31       first stent  . Heart attack Brother   . CAD Brother 47       CABG   . Hypertension Brother   . Heart attack Brother 14       died from MI    SOCHx:   reports that  has never smoked. she has never used smokeless tobacco. She reports that she drinks alcohol. She reports that she does not use drugs.  ALLERGIES:  Allergies  Allergen Reactions  . Niaspan [Niacin Er] Other (See Comments)    Flushing, even with ASA    ROS: Pertinent items noted in HPI and remainder of comprehensive ROS otherwise negative.  HOME MEDS: Current Outpatient Medications  Medication Sig Dispense Refill  . aspirin EC 81 MG tablet Take 81 mg by mouth daily.    . Evolocumab (REPATHA SURECLICK) 527 MG/ML SOAJ Inject 140 mg into the skin every 14 (fourteen) days. 2 pen 12  . furosemide (LASIX) 40 MG tablet TAKE 1/2 TABLET (20MG ) BY MOUTH DAILY. 45 tablet 3  . JARDIANCE 25 MG TABS tablet Take 25 mg by mouth daily.  6  . levothyroxine (SYNTHROID, LEVOTHROID) 50 MCG tablet Take 50 mcg by mouth daily before breakfast.    . metFORMIN (GLUMETZA) 500 MG (MOD) 24 hr tablet Take 500 mg by mouth daily with breakfast.     . metoprolol succinate (TOPROL-XL) 100 MG 24 hr tablet TAKE 1 TABLET (100 MG TOTAL) BY MOUTH DAILY. TAKE WITH OR IMMEDIATELY FOLLOWING A MEAL. 90 tablet 3  . ramipril (ALTACE) 5 MG capsule Take 5 mg by mouth daily.    . rosuvastatin (CRESTOR) 40 MG tablet TAKE 1 TABLET (40 MG TOTAL) BY MOUTH DAILY. 90 tablet 3  . warfarin (COUMADIN) 2 MG tablet TAKE 1 TO 1 AND 1/2 TABLETS BY MOUTH DAILY AS DIRECTED BY COUMADIN CLINIC. 120 tablet 2  . zaleplon (SONATA) 5 MG capsule Take 5 mg by mouth at bedtime as needed.     No current facility-administered medications for this visit.     LABS/IMAGING: Results for orders placed or performed in visit on  07/09/17 (from the past 48 hour(s))  POCT INR  Status: None   Collection Time: 07/09/17 12:15 PM  Result Value Ref Range   INR 2.7    No results found.  VITALS: BP (!) 154/76   Pulse 86   Ht 5\' 3"  (1.6 m)   Wt 162 lb (73.5 kg)   LMP  (LMP Unknown)   BMI 28.70 kg/m   EXAM: General appearance: alert and no distress Neck: no adenopathy, no carotid bruit, no JVD, supple, symmetrical, trachea midline and thyroid not enlarged, symmetric, no tenderness/mass/nodules Lungs: clear to auscultation bilaterally Heart: regular rate and rhythm, S1, S2 normal, no murmur, click, rub or gallop and Sharp mechanical valve sound Abdomen: soft, non-tender; bowel sounds normal; no masses,  no organomegaly Extremities: extremities normal, atraumatic, no cyanosis or edema Pulses: 2+ and symmetric Skin: Skin color, texture, turgor normal. No rashes or lesions Neurologic: Grossly normal  EKG: Normal sinus rhythm at 86, LBBB-personally reviewed  ASSESSMENT: 1. Coronary artery disease status post 3 vessel CABG (LIMA to LAD, SVG to circumflex, SVG to PDA) - 07/2010 2. Severe aortic stenosis and a bicuspid aortic valve, status post 19 mm St. Jude mechanical aortic valve excellent recent left popliteal artery occlusion with rest pain status post angioplasty (2012) 3. History of pulmonary emboli associated with DVT 4. Ischemic cardiomyopathy, EF now normalized at 55-60% 5. Hypertension - at goal 6. Familial hyperlipidemia - LDL>190 without treatment 7. Diabetes type 2 - controlled 8. LBBB   PLAN: 1.   Katrina Obrien has had marked improvement in her dyslipidemia with aggressive treatment on statin, ezetimibe and Repatha.  Her triglycerides recently have been elevated, this may have been due to a nonfasting state and poorly controlled diabetes.  Vania Rea has been added and her hemoglobin A1c has improved to 6.1.  We will repeat a lipid profile next month and if her triglycerides remain elevated, will consider  adding Vascepa.  Follow-up with me in 6 months.  Pixie Casino, MD, [facility], [place] Director of the Advanced Lipid Disorders &  Cardi[facility] Clinic Attending Cardiologist  Direct Dial: [phone]  Fax: [phone]  Website:  [email or web address] 07/09/2017, 7:12 PM

## 2017-08-19 ENCOUNTER — Ambulatory Visit (INDEPENDENT_AMBULATORY_CARE_PROVIDER_SITE_OTHER): Payer: BC Managed Care – PPO | Admitting: Pharmacist Clinician (PhC)/ Clinical Pharmacy Specialist

## 2017-08-19 DIAGNOSIS — Z952 Presence of prosthetic heart valve: Secondary | ICD-10-CM

## 2017-08-19 DIAGNOSIS — Z7901 Long term (current) use of anticoagulants: Secondary | ICD-10-CM

## 2017-08-19 LAB — POCT INR: INR: 3.3

## 2017-08-19 NOTE — Patient Instructions (Signed)
Description   Continue 1 tablet daily except 1.5 tablets each Monday and Friday, repeat INR in 6 weeks.

## 2017-08-25 LAB — LIPID PANEL
CHOLESTEROL TOTAL: 189 mg/dL (ref 100–199)
Chol/HDL Ratio: 4 ratio (ref 0.0–4.4)
HDL: 47 mg/dL (ref >39)
LDL Calculated: 107 mg/dL — ABNORMAL HIGH (ref 0–99)
Triglycerides: 174 mg/dL — ABNORMAL HIGH (ref 0–149)
VLDL Cholesterol Cal: 35 mg/dL (ref 5–40)

## 2017-09-30 ENCOUNTER — Ambulatory Visit (INDEPENDENT_AMBULATORY_CARE_PROVIDER_SITE_OTHER): Payer: BC Managed Care – PPO | Admitting: Pharmacist

## 2017-09-30 DIAGNOSIS — Z7901 Long term (current) use of anticoagulants: Secondary | ICD-10-CM | POA: Diagnosis not present

## 2017-09-30 DIAGNOSIS — Z952 Presence of prosthetic heart valve: Secondary | ICD-10-CM

## 2017-09-30 LAB — POCT INR: INR: 3

## 2017-10-30 ENCOUNTER — Other Ambulatory Visit: Payer: Self-pay | Admitting: Internal Medicine

## 2017-10-30 NOTE — Telephone Encounter (Signed)
Rx has been sent to the pharmacy electronically. ° °

## 2017-11-11 ENCOUNTER — Ambulatory Visit (INDEPENDENT_AMBULATORY_CARE_PROVIDER_SITE_OTHER): Payer: BC Managed Care – PPO | Admitting: Pharmacist Clinician (PhC)/ Clinical Pharmacy Specialist

## 2017-11-11 DIAGNOSIS — Z7901 Long term (current) use of anticoagulants: Secondary | ICD-10-CM | POA: Diagnosis not present

## 2017-11-11 DIAGNOSIS — Z952 Presence of prosthetic heart valve: Secondary | ICD-10-CM | POA: Diagnosis not present

## 2017-11-11 DIAGNOSIS — I2699 Other pulmonary embolism without acute cor pulmonale: Secondary | ICD-10-CM | POA: Diagnosis not present

## 2017-11-11 LAB — POCT INR: INR: 2.2

## 2017-11-11 NOTE — Patient Instructions (Signed)
Description   Take 1.5 tablets today Wednesday April 17, then continue 1 tablet daily except 1.5 tablets each Monday and Friday, repeat INR in 4 weeks.

## 2017-12-09 ENCOUNTER — Ambulatory Visit (INDEPENDENT_AMBULATORY_CARE_PROVIDER_SITE_OTHER): Payer: BC Managed Care – PPO | Admitting: Pharmacist Clinician (PhC)/ Clinical Pharmacy Specialist

## 2017-12-09 DIAGNOSIS — Z952 Presence of prosthetic heart valve: Secondary | ICD-10-CM | POA: Diagnosis not present

## 2017-12-09 DIAGNOSIS — Z7901 Long term (current) use of anticoagulants: Secondary | ICD-10-CM | POA: Diagnosis not present

## 2017-12-09 LAB — POCT INR: INR: 2.5

## 2018-01-06 ENCOUNTER — Ambulatory Visit (INDEPENDENT_AMBULATORY_CARE_PROVIDER_SITE_OTHER): Payer: BC Managed Care – PPO | Admitting: Pharmacist

## 2018-01-06 DIAGNOSIS — Z7901 Long term (current) use of anticoagulants: Secondary | ICD-10-CM | POA: Diagnosis not present

## 2018-01-06 DIAGNOSIS — Z952 Presence of prosthetic heart valve: Secondary | ICD-10-CM

## 2018-01-06 DIAGNOSIS — E7800 Pure hypercholesterolemia, unspecified: Secondary | ICD-10-CM

## 2018-01-06 LAB — POCT INR: INR: 3.9 — AB (ref 2.0–3.0)

## 2018-01-08 ENCOUNTER — Other Ambulatory Visit: Payer: Self-pay | Admitting: Internal Medicine

## 2018-01-27 ENCOUNTER — Ambulatory Visit (INDEPENDENT_AMBULATORY_CARE_PROVIDER_SITE_OTHER): Payer: BC Managed Care – PPO | Admitting: Pharmacist Clinician (PhC)/ Clinical Pharmacy Specialist

## 2018-01-27 DIAGNOSIS — I2699 Other pulmonary embolism without acute cor pulmonale: Secondary | ICD-10-CM

## 2018-01-27 DIAGNOSIS — Z952 Presence of prosthetic heart valve: Secondary | ICD-10-CM | POA: Diagnosis not present

## 2018-01-27 DIAGNOSIS — Z7901 Long term (current) use of anticoagulants: Secondary | ICD-10-CM

## 2018-01-27 LAB — POCT INR: INR: 2.5 (ref 2.0–3.0)

## 2018-01-28 LAB — LIPID PANEL
Chol/HDL Ratio: 3.1 ratio (ref 0.0–4.4)
Cholesterol, Total: 135 mg/dL (ref 100–199)
HDL: 44 mg/dL (ref >39)
LDL Calculated: 64 mg/dL (ref 0–99)
TRIGLYCERIDES: 137 mg/dL (ref 0–149)
VLDL CHOLESTEROL CAL: 27 mg/dL (ref 5–40)

## 2018-01-28 LAB — TSH: TSH: 1.53 u[IU]/mL (ref 0.450–4.500)

## 2018-02-03 ENCOUNTER — Ambulatory Visit (INDEPENDENT_AMBULATORY_CARE_PROVIDER_SITE_OTHER): Payer: BC Managed Care – PPO | Admitting: Family Medicine

## 2018-02-03 ENCOUNTER — Encounter: Payer: Self-pay | Admitting: Family Medicine

## 2018-02-03 VITALS — BP 125/75 | HR 71 | Ht 63.0 in | Wt 152.0 lb

## 2018-02-03 DIAGNOSIS — E039 Hypothyroidism, unspecified: Secondary | ICD-10-CM

## 2018-02-03 DIAGNOSIS — I152 Hypertension secondary to endocrine disorders: Secondary | ICD-10-CM

## 2018-02-03 DIAGNOSIS — E1169 Type 2 diabetes mellitus with other specified complication: Secondary | ICD-10-CM | POA: Diagnosis not present

## 2018-02-03 DIAGNOSIS — E118 Type 2 diabetes mellitus with unspecified complications: Secondary | ICD-10-CM

## 2018-02-03 DIAGNOSIS — E1159 Type 2 diabetes mellitus with other circulatory complications: Secondary | ICD-10-CM

## 2018-02-03 DIAGNOSIS — Z952 Presence of prosthetic heart valve: Secondary | ICD-10-CM

## 2018-02-03 DIAGNOSIS — I10 Essential (primary) hypertension: Secondary | ICD-10-CM

## 2018-02-03 DIAGNOSIS — E785 Hyperlipidemia, unspecified: Secondary | ICD-10-CM

## 2018-02-03 DIAGNOSIS — Z951 Presence of aortocoronary bypass graft: Secondary | ICD-10-CM

## 2018-02-03 DIAGNOSIS — Z7901 Long term (current) use of anticoagulants: Secondary | ICD-10-CM | POA: Diagnosis not present

## 2018-02-03 NOTE — Progress Notes (Signed)
New patient office visit note:  Impression and Recommendations:    1. Type 2 diabetes mellitus with complication, without long-term current use of insulin (Katrina Obrien)   2. Hyperlipidemia associated with type 2 diabetes mellitus (Katrina Obrien)   3. Hypertension associated with diabetes (Katrina Obrien)   4. S/P AVR   5. S/P CABG x 3   6. Long term current use of anticoagulant therapy   7. Hypothyroidism, unspecified type     Patient expresses that she would like to obtain Jardiance, metformin, and ramipril here, but all other medications from Dr. Debara Pickett.  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 Debara Pickett of cardiology. - Pt desires to still get all cardiovascular meds from him as she expresses great confidence in him today - Advised patient to continue to follow-up cardiology as instructed.  2. Diabetes - Currently managed by Dr. Chalmers Cater of endocrinology.  - Pt expresses today that she would like [place] to manage this disease in future for her.  Counseled patient that once her diabetes is stable/ under control, patient may begin to obtain more regular diabetic care here. -Since she does have a history of being very poorly controlled, advised she might be better off going once yearly to Dr. Soyla Murphy for checkup if the physician agrees  3. Blood Pressure - Elevated in office today.   - Ambulatory BP monitoring encouraged. Sit quietly for 15-20 minutes prior to measurement, with no prior stimulation with caffeine or emotion.   - Keep log and bring in next OV. - We will need follow-up sooner than planned with cardiology if remains above goal  4. BMI Counseling Explained to patient what BMI refers to, and what it means medically.    Told patient to think about it as a "medical risk stratification measurement" and how increasing BMI is associated with increasing risk/ or worsening state of various diseases such as  hypertension, hyperlipidemia, diabetes, premature OA, depression etc.  American Heart Association guidelines for healthy diet, basically Mediterranean diet, and exercise guidelines of 30 minutes 5 days per week or more discussed in detail.  Health counseling performed.  All questions answered.  5. Lifestyle & Preventative Health Maintenance - Advised patient to continue working toward exercising to improve overall mental, physical, and emotional health.    - Continue engaging in daily physical activity.  Recommended that the patient continue to strive for at least 150 minutes of moderate cardiovascular activity per week, meeting guidelines established by the [facility].   - Healthy dietary habits encouraged, including low-carb, and high amounts of lean protein in diet.   - Patient should also consume adequate amounts of water - half of body weight in oz of water per day.   Education and routine counseling performed. Handouts provided.  6. Follow-Up - Return for chronic OV 3 times yearly at minimum.   - In the near future, return for a yearly physical.  During CPE, GYN, colonoscopy, mammogram, and other health maintenance screenings will be covered.  - Patient knows to sign to have Dr. Almetta Lovely information sent here.   Gross side effects, risk and benefits, and alternatives of medications discussed with patient.  Patient is aware that all medications have potential side effects and we are unable to predict every side effect or drug-drug interaction that may occur.  Expresses verbal understanding and consents to current therapy plan and treatment regimen.  Return for  CPE/ yrly physical, come fasting. next available   Please see AVS handed out to patient at the end of our visit for further patient instructions/ counseling done pertaining to today's office visit.    Note: This document was prepared using Dragon voice recognition software and may include unintentional dictation errors.  This  document serves as a record of services personally performed by Mellody Dance, DO. It was created on her behalf by Toni Amend, a trained medical scribe. The creation of this record is based on the scribe's personal observations and the provider's statements to them.   I have reviewed the above medical documentation for accuracy and completeness and I concur.  Mellody Dance 02/03/18 2:19 PM   ----------------------------------------------------------------------------------------------------------------------    Subjective:    Chief complaint:   Chief Complaint  Patient presents with  . Establish Care    HPI: Katrina Obrien is a pleasant 62 y.o. female who presents to [place] at [facility] today to review their medical history with me and establish care.   I asked the patient to review their chronic problem list with me to ensure everything was updated and accurate.    All recent office visits with other providers, any medical records that patient brought in etc  - I reviewed today.     We asked pt to get [place] their medical records from [facility] providers/ specialists that they had seen within the past 3-5 years- if they are in private practice and/or do not work for [employer], [facility], [place], [place] or [employer] owned practice.  Told them to call their specialists to clarify this if they are not sure.   - Reason for Establishing Care States "everybody I know comes here."  Establishing here because she heard good things, hasn't had a PCP in many years.  She doesn't have an OBGYN.  Says "I am trying to get myself back together."   States that her husband has made himself a hermit the past two years, and hasn't been to a doctor.  Her husband is a source of significant stress to her.  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  Married to husband.   Has two children.  No biological grandchildren. Daughter just  took custody of her two nieces, aged 40 and 69.  Surgical History Past Surgical History:  Procedure Laterality Date  . AORTIC VALVE REPLACEMENT (AVR)/CORONARY ARTERY BYPASS GRAFTING (CABG)  08/16/2010  VAN TRIGT   AV replacement w/19-mm mechanical St Jude valve ([id number]),CABGX3  lima to LAD, SVG to distal circ,SVG to posterior descending  . ASD REPAIR    . CARDIAC CATHETERIZATION  08/08/2010   ,LAD 50% PROX,605 TANDEM SEGMENTAL STENOSIS,LEFT CIRC 60%-AV GROOVE, RGT COR 40% PROX ON THE BEND ,80% PROX PDA,  . CORONARY ARTERY BYPASS GRAFT  08/16/2010   LIMA TO lad,svg to distal circ ,svg to posterior descending  . DOPPLER ECHOCARDIOGRAPHY  08/09/2010   EF35% to 40% ,bbicuspid ;severely  thickened ,severely calcified leaflets, LV normal  . LEA doppler  08/07/2010   left popliteal occlusion ,and anterior tibial complete occlusion  . NM MYOCAR PERF WALL MOTION  08/06/2010   EF 31%  LOW RISK SCAN  . nuc    . PV ANGIOGRAM  08/08/2010   successful PTA LEFT POPLITEAL   Past Medical History  Patient notes that she's out of date with all of her health maintenance screenings, mammograms, colonoscopy, etc.  - Physical Activity Has lost 30 lbs  in the last 14 months.  Notes she was "afraid of where things were going" with her heatlh, and therefore began changing her diet, and regularly walking.  She uses a fitbit and states "I am obsessed with it."  Obtains around 13,000 steps per day.  Patient starts with 30 minutes of walking in the morning.  Notes she can't go too far in the morning because she has cramps in her legs.  Notes that during the day she's still walking, and walks another 30 minutes at the end of the day.  She walks 7 days a week.  - Cardiac Health Patient follows up with Dr. Debara Pickett for cardiology.  Seven years ago she thought she had pneumonia; turned out her "aortic valve was calcified and breaking apart;" she had two blood clots in her left leg, had a blood clot in her lungs. States  "it was overwhelming and wild."  She follows up with Dr. Debara Pickett; notes that "he saved my life."  - Blood Pressure Patient has a BP monitor, but doesn't regularly check her blood pressure at home.  - Endocrinology - Diabetes & Hypothyroidism Follows up with Dr. Chalmers Cater for endocrinology, but would rather begin obtaining care here.   Diabetes - Diagnosed 7 years ago Currently with A1c ranging 6.1 to 6.5.  Has another appointment in October.  Notes a history of intolerance to metformin.  Takes 500 mg daily now. Patient is taking Jardiance; has been on it for a year and her A1c has been going down. Last A1c was 6.5; notes that this was during a time when she had the flu.  Fasting blood sugar usually runs about 130.  A year ago, they ran 160.   Hypothyroid - Diagnosed 7 years ago Currently controlled with synthroid.  Patient denies new onset of chest pain, exercise intolerance, shortness of breath, dizziness, visual changes, headache, lower extremity swelling or claudication.    Wt Readings from Last 3 Encounters:  02/03/18 152 lb (68.9 kg)  07/09/17 162 lb (73.5 kg)  07/15/16 182 lb 6.4 oz (82.7 kg)   BP Readings from Last 3 Encounters:  02/03/18 125/75  07/09/17 (!) 154/76  07/15/16 138/74   Pulse Readings from Last 3 Encounters:  02/03/18 71  07/09/17 86  07/15/16 82   BMI Readings from Last 3 Encounters:  02/03/18 26.93 kg/m  07/09/17 28.70 kg/m  07/15/16 32.31 kg/m    Patient Care Team    Relationship Specialty Notifications Start End  Mellody Dance, DO PCP - General Family Medicine  02/03/18   Jacelyn Pi, MD Attending Physician Endocrinology  01/26/13   Pixie Casino, MD Consulting Physician Cardiology  02/03/18     Patient Active Problem List   Diagnosis Date Noted  . Hyperlipidemia associated with type 2 diabetes mellitus ([place]) 02/03/2018    Priority: High  . 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 DM2 (diabetes mellitus, type 2) ([place])  01/26/2013    Priority: High  . Hypertension associated with diabetes ([place]) 01/26/2013    Priority: High  . Hypothyroidism 02/03/2018    Priority: Medium  . Femoral popliteal artery thrombus ([place]) 01/26/2013    Priority: Medium  . DVT (deep venous thrombosis) ([place]) 01/26/2013    Priority: Medium  . Pulmonary embolus ([place]) 01/26/2013    Priority: Medium  . S/P AVR 01/26/2013    Priority: Low  . Long term current use of anticoagulant therapy 10/11/2012    Priority: Low  . LBBB (left bundle  branch block) 04/06/2014  . Aortic stenosis 01/26/2013  . 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 Cardiomyopathy, ischemic 01/26/2013     Past Medical History:  Diagnosis Date  . 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 Coronary artery disease    s/p 3- vessel bypass   . Diabetes mellitus without complication ([place])   . Heart murmur   . Hyperlipidemia   . Hypertension   . Ischemic cardiomyopathy    40 to 45%  . Popliteal artery occlusion, left (H[facility])    recent  status  post angioplasty  . Pulmonary embolism ([place])    associated with DVT     Past Medical History:  Diagnosis Date  . 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 Coronary artery disease    s/p 3- vessel bypass   . Diabetes mellitus without complication ([place])   . Heart murmur   . Hyperlipidemia   . Hypertension   . Ischemic cardiomyopathy    40 to 45%  . Popliteal artery occlusion, left (HCC)    recent  status  post angioplasty  . Pulmonary embolism ([place])    associated with DVT     Past Surgical History:  Procedure Laterality Date  . AORTIC VALVE REPLACEMENT (AVR)/CORONARY ARTERY BYPASS GRAFTING (CABG)  08/16/2010  VAN TRIGT   AV replacement w/19-mm mechanical St Jude valve ([id number]),CABGX3  lima to LAD, SVG to distal circ,SVG to posterior descending   . ASD REPAIR    . CARDIAC CATHETERIZATION  08/08/2010   ,LAD 50% PROX,605 TANDEM SEGMENTAL STENOSIS,LEFT CIRC 60%-AV GROOVE, RGT COR 40% PROX ON THE BEND ,80% PROX PDA,  . CORONARY ARTERY BYPASS GRAFT  08/16/2010   LIMA TO lad,svg to distal circ ,svg to posterior descending  . DOPPLER ECHOCARDIOGRAPHY  08/09/2010   EF35% to 40% ,bbicuspid ;severely  thickened ,severely calcified leaflets, LV normal  . LEA doppler  08/07/2010   left popliteal occlusion ,and anterior tibial complete occlusion  . NM MYOCAR PERF WALL MOTION  08/06/2010   EF 31%  LOW RISK SCAN  . nuc    . PV ANGIOGRAM  08/08/2010   successful PTA LEFT POPLITEAL     Family History  Problem Relation Age of Onset  . Cancer Mother   . Cancer Father   . CAD Father   . Heart attack Father 64       first MI, 2 major after that, multiple silent MIs  . Stroke Brother   . CAD Brother 69       first stent  . Heart attack Brother   . CAD Brother 69       CABG   . Hypertension Brother   . Heart attack Brother 54       died from MI     Social History   Substance and Sexual Activity  Drug Use No     Social History   Substance and Sexual Activity  Alcohol Use Yes  . Alcohol/week: 4.2 oz  . Types: 7 Standard drinks or equivalent per week     Social History   Tobacco Use  Smoking Status Never Smoker  Smokeless Tobacco Never Used     Current Meds  Medication Sig  . aspirin EC 81 MG tablet Take 81 mg by mouth daily.  . furosemide (LASIX) 40 MG tablet TAKE 1/2 TABLET BY  MOUTH DAILY.  [place] Kitchen JARDIANCE 25 MG TABS tablet Take 25 mg by mouth daily.  [place] Kitchen levothyroxine (SYNTHROID, LEVOTHROID) 50 MCG tablet Take 50 mcg by mouth daily before breakfast.  . metFORMIN (GLUMETZA) 500 MG (MOD) 24 hr tablet Take 500 mg by mouth daily.   . metoprolol succinate (TOPROL-XL) 100 MG 24 hr tablet TAKE 1 TABLET BY MOUTH DAILY. TAKE WITH OR IMMEDIATELY FOLLOWING A MEAL.  . ramipril (ALTACE) 5 MG capsule Take 5 mg by mouth daily.  Mar[place] Kitchen  REPATHA SURECLICK 794 MG/ML SOAJ INJECT 140MG  SUBCUTANEOUSLY EVERY 14 DAYS  . rosuvastatin (CRESTOR) 40 MG tablet TAKE 1 TABLET BY MOUTH DAILY.  [place] Kitchen warfarin (COUMADIN) 2 MG tablet TAKE 1 TO 1 AND 1/2 TABLETS BY MOUTH DAILY AS DIRECTED BY COUMADIN CLINIC.  Mar[place] Kitchen zaleplon (SONATA) 5 MG capsule Take 5 mg by mouth at bedtime as needed.    Allergies: Niaspan [niacin er]   Review of Systems  Constitutional: Negative for chills, diaphoresis, fever, malaise/fatigue and weight loss.  HENT: Negative for congestion, sore throat and tinnitus.   Eyes: Negative for blurred vision, double vision and photophobia.  Respiratory: Negative for cough and wheezing.   Cardiovascular: Negative for chest pain and palpitations.  Gastrointestinal: Negative for blood in stool, diarrhea, nausea and vomiting.  Genitourinary: Negative for dysuria, frequency and urgency.  Musculoskeletal: Negative for joint pain and myalgias.  Skin: Negative for itching and rash.  Neurological: Negative for dizziness, focal weakness, weakness and headaches.  Endo/Heme/Allergies: Negative for environmental allergies and polydipsia. Does not bruise/bleed easily.  Psychiatric/Behavioral: Negative for depression and memory loss. The patient is not nervous/anxious and does not have insomnia.      Objective:   Blood pressure 125/75, pulse 71, height 5\' 3"  (1.6 m), weight 152 lb (68.9 kg), SpO2 99 %. Body mass index is 26.93 kg/m. General: Well Developed, well nourished, and in no acute distress.  Neuro: Alert and oriented x3, extra-ocular muscles intact, sensation grossly intact.  HEENT:/AT, PERRLA, neck supple, No carotid bruits Skin: no gross rashes  Cardiac:  Mechanical valve in place.  Regular rate and rhythm Respiratory: Essentially clear to auscultation bilaterally. Not using accessory muscles, speaking in full sentences.  Abdominal: not grossly distended Musculoskeletal: Ambulates w/o diff, FROM * 4 ext.  Vasc: less 2 sec cap  RF, warm and pink  Psych:  No HI/SI, judgement and insight good, Euthymic mood. Full Affect.    Recent Results (from the past 2160 hour(s))  POCT INR     Status: None   Collection Time: 11/11/17  3:17 PM  Result Value Ref Range   INR 2.2   POCT INR     Status: None   Collection Time: 12/09/17  1:42 PM  Result Value Ref Range   INR 2.5   POCT INR     Status: Abnormal   Collection Time: 01/06/18  8:19 AM  Result Value Ref Range   INR 3.9 (A) 2.0 - 3.0  POCT INR     Status: None   Collection Time: 01/27/18  7:47 AM  Result Value Ref Range   INR 2.5 2.0 - 3.0  Lipid Profile     Status: None   Collection Time: 01/27/18  8:04 AM  Result Value Ref Range   Cholesterol, Total 135 100 - 199 mg/dL   Triglycerides 137 0 - 149 mg/dL   HDL 44 >39 mg/dL   VLDL Cholesterol Cal 27 5 - 40 mg/dL   LDL Calculated 64 0 - 99 mg/dL  Chol/HDL Ratio 3.1 0.0 - 4.4 ratio    Comment:                                   T. Chol/HDL Ratio                                             Men  Women                               1/2 Avg.Risk  3.4    3.3                                   Avg.Risk  5.0    4.4                                2X Avg.Risk  9.6    7.1                                3X Avg.Risk 23.4   11.0   TSH     Status: None   Collection Time: 01/27/18  8:04 AM  Result Value Ref Range   TSH 1.530 0.450 - 4.500 uIU/mL

## 2018-02-03 NOTE — Patient Instructions (Signed)

## 2018-02-08 ENCOUNTER — Ambulatory Visit (INDEPENDENT_AMBULATORY_CARE_PROVIDER_SITE_OTHER): Payer: BC Managed Care – PPO | Admitting: Family Medicine

## 2018-02-08 ENCOUNTER — Encounter: Payer: Self-pay | Admitting: Physician Assistant

## 2018-02-08 ENCOUNTER — Encounter: Payer: Self-pay | Admitting: Family Medicine

## 2018-02-08 ENCOUNTER — Other Ambulatory Visit (HOSPITAL_COMMUNITY)
Admission: RE | Admit: 2018-02-08 | Discharge: 2018-02-08 | Disposition: A | Discharge status: Home / Self Care | Payer: BC Managed Care – PPO | Source: Ambulatory Visit | Attending: Family Medicine | Admitting: Family Medicine

## 2018-02-08 VITALS — BP 140/79 | HR 66 | Ht 63.0 in | Wt 152.4 lb

## 2018-02-08 DIAGNOSIS — E1169 Type 2 diabetes mellitus with other specified complication: Secondary | ICD-10-CM | POA: Diagnosis not present

## 2018-02-08 DIAGNOSIS — Z1231 Encounter for screening mammogram for malignant neoplasm of breast: Secondary | ICD-10-CM | POA: Diagnosis not present

## 2018-02-08 DIAGNOSIS — Z23 Encounter for immunization: Secondary | ICD-10-CM

## 2018-02-08 DIAGNOSIS — Z1239 Encounter for other screening for malignant neoplasm of breast: Secondary | ICD-10-CM

## 2018-02-08 DIAGNOSIS — Z0000 Encounter for general adult medical examination without abnormal findings: Secondary | ICD-10-CM

## 2018-02-08 DIAGNOSIS — Z719 Counseling, unspecified: Secondary | ICD-10-CM | POA: Diagnosis not present

## 2018-02-08 DIAGNOSIS — Z124 Encounter for screening for malignant neoplasm of cervix: Secondary | ICD-10-CM | POA: Insufficient documentation

## 2018-02-08 DIAGNOSIS — Z1211 Encounter for screening for malignant neoplasm of colon: Secondary | ICD-10-CM

## 2018-02-08 DIAGNOSIS — E039 Hypothyroidism, unspecified: Secondary | ICD-10-CM

## 2018-02-08 DIAGNOSIS — E118 Type 2 diabetes mellitus with unspecified complications: Secondary | ICD-10-CM | POA: Diagnosis not present

## 2018-02-08 DIAGNOSIS — E785 Hyperlipidemia, unspecified: Secondary | ICD-10-CM

## 2018-02-08 NOTE — Progress Notes (Signed)
Impression and Recommendations:    1. Encounter for wellness examination   2. Need for Tdap vaccination   3. Health education/counseling   4. Type 2 diabetes mellitus with complication, without long-term current use of insulin (HCC)   5. Hyperlipidemia associated with type 2 diabetes mellitus (HCC)   6. Hypothyroidism, unspecified type   7. Screening for colon cancer   8. Screening for breast cancer     1) Anticipatory Guidance: Discussed importance of wearing a seatbelt while driving, not texting while driving; sunscreen when outside along with yearly skin surveillance; eating a well balanced and modest diet; physical activity at least 25 minutes per day or 150 min/ week of moderate to intense activity.  - Educated patient about the necessity to monitor skin health.  Keep an eye out for skin changing, irregular borders, changing colors or strange darkness, unusual itching or bleeding, etc.   - Educated patient about prudent habits while performing self-breast exams, including checking the tissues in the armpit/axilla, using the fingers to roll the breast tissue to feel for lumps or bumps, and checking the nipples for blood or unusual discharge by squeezing.  Encouraged patient to perform self-breast exams at the same time every month.  2) Immunizations / Screenings / Labs: All immunizations and screenings that patient agrees to, are up-to-date per recommendations or will be updated today.  Patient understands the needs for q 13mo dental and yearly vision screens which pt will schedule independently. Obtain CBC, CMP, HgA1c, Lipid panel, TSH and vit D when fasting if not already done recently.   - Educated the patient at length about standard of care and critical medical importance of obtaining regular colonoscopies.  Referral for colonoscopy placed today.  - Referral for mammogram placed today.  - Referral placed for routine diabetic eye exam.  - Risks and benefits of immunizations  reviewed with the patient today.  Encouraged patient to call her insurance about vaccinations, such as Shingrix and TDAP, and let us know if she would like to obtain them here.  Vaccine information sheets provided today for patient education.  3) Weight: Discussed goal of losing even 5-10% of current body weight which would improve overall feelings of well being and improve objective health data significantly.   Improve nutrient density of diet through increasing intake of fruits and vegetables and decreasing saturated/trans fats, white flour products and refined sugar products.  4) BMI Counseling Explained to patient what BMI refers to, and what it means medically.    Told patient to think about it as a "medical risk stratification measurement" and how increasing BMI is associated with increasing risk/ or worsening state of various diseases such as hypertension, hyperlipidemia, diabetes, premature OA, depression etc.  American Heart Association guidelines for healthy diet, basically Mediterranean diet, and exercise guidelines of 30 minutes 5 days per week or more discussed in detail.  Health counseling performed.  All questions answered.  5) Lifestyle & Preventative Health Maintenance - Advised patient to continue working toward exercising to improve overall mental, physical, and emotional health.    - Encouraged patient to engage in daily physical activity.  Recommended that the patient eventually strive for at least 150 minutes of moderate cardiovascular activity per week according to guidelines established by the [facility].   - Healthy dietary habits encouraged, including low-carb, and high amounts of lean protein in diet.   - Patient should also consume adequate amounts of water - half of body weight in oz of water per day.  6) Follow-Up - Return in three months for regularly scheduled chronic diabetes follow-up.  - Patient knows that if the ringing in her ears gets louder or more bothersome,  she may request a referral to ENT.  - Lab work drawn today.  Patient states that her thyroid and lipid panel was done a few weeks ago with Dr. Rennis Golden.  - Pap smear performed today. CMA to order diabetic eye exam, mammogram, and colonoscopy.  Diabetic foot exam and urine micro obtained today if needed.   Orders Placed This Encounter  Procedures  . MM Digital Screening  . Tdap vaccine greater than or equal to 7yo IM  . Comprehensive metabolic panel  . CBC with Differential/Platelet  . Hemoglobin A1c  . Lipid panel  . HIV antibody  . Hepatitis C antibody  . Magnesium  . T4, free  . TSH  . VITAMIN D 25 Hydroxy (Vit-D Deficiency, Fractures)  . Microalbumin / creatinine urine ratio  . Ambulatory referral to Gastroenterology  . HM Diabetes Foot Exam    Gross side effects, risk and benefits, and alternatives of medications discussed with patient.  Patient is aware that all medications have potential side effects and we are unable to predict every side effect or drug-drug interaction that may occur.  Expresses verbal understanding and consents to current therapy plan and treatment regimen.  F-up preventative CPE in 1 year. F/up sooner for chronic care management as discussed and/or prn.  Please see orders placed and AVS handed out to patient at the end of our visit for further patient instructions/ counseling done pertaining to today's office visit.  This document serves as a record of services personally performed by Thomasene Lot, DO. It was created on her behalf by Katrina Obrien, a trained medical scribe. The creation of this record is based on the scribe's personal observations and the provider's statements to them.   I have reviewed the above medical documentation for accuracy and completeness and I concur.  Thomasene Lot 02/08/18 9:30 AM     Subjective:    Chief Complaint  Patient presents with  . Annual Exam   CC: none-has not had yearly physical many  years  HPI: Katrina Villada Obrien is a 62 y.o. female who presents to [facility] today a yearly health maintenance exam.  Health Maintenance Summary Reviewed and updated, unless pt declines services.  Colonoscopy:   Patient is not up to date. Tobacco History Reviewed:   Y; Never Smoker.  Notes "my dad was a big smoker and we never did." Alcohol:    No concerns, no excessive use Exercise Habits:   Not meeting AHA guidelines STD concerns:   None; monogamous with husband Drug Use:   None Birth control method:   n/a Menses regular:     n/a Lumps or breast concerns:      no Breast Cancer Family History:      No  Patient states her BP was 124/68 before she left for her appointment today.  Colonoscopy Screenings Denies family history of colon cancer; patient has never had a colonoscopy before.  Notes "I was not taking care of myself for a long time."  She does not want to have a colonoscopy because, as she notes, "it's just the thought of it."  Female Health/OBGYN Patient denies history of abnormal pap.  States that it's "been a long time" since her last mammogram.  Denies family history of breast, ovarian, uterine, cervical cancers.  Patient notes that  she does perform self-breast exams in the shower.  Immunizations Patient cannot remember if she's had a TDAP; believes this injection would have occurred 7 years ago with all of her other medical issues/procedures at the time.  Patient notes that her husband had shingles, so she is interested in the shingles vaccine.  Chronic Ringing in Ears Denies issues hearing, but states "I do have a slight ringing in my ears most of the time."  Notes she did not experience this prior to surgery.  Denies dizziness, lightheadedness, or visual changes with this.  Dental Health States that getting up to date with her dental health is her "next thing."  Dermatological Health Patient does not follow up with dermatology and never  has in the past.  Denies issues with constipation, diarrhea, nausea, vomiting.  Denies pain in the area of the bladder.  Family History of Heart Issues/MI Patient describes family history regarding her father's heart.  She was [age] and states she can't remember everything, but recalls that her father had an enlarged heart along with several "silent heart attacks," "he never knew he was having them."  States that he had a massive heart attack, and later, after having "another episode," they told him "that was it, they couldn't do anything for him."  Remarks that later, her father was "fixed" by a "gamble" through cardiology.  Family History of Diabetes One of her brothers has diabetes.  Believes that her grandfather may have had diabetes.    Immunization History  Administered Date(s) Administered  . Influenza,inj,Quad PF,6+ Mos 05/12/2017    Health Maintenance  Topic Date Due  . PAP SMEAR  11/06/1977  . FOOT EXAM  03/26/2018 (Originally 11/07/1966)  . HEMOGLOBIN A1C  03/26/2018 (Originally 02/05/2011)  . OPHTHALMOLOGY EXAM  03/26/2018 (Originally 11/07/1966)  . PNEUMOCOCCAL POLYSACCHARIDE VACCINE (1) 02/04/2019 (Originally 11/07/1958)  . MAMMOGRAM  02/04/2019 (Originally 11/07/2006)  . COLONOSCOPY  02/04/2019 (Originally 11/07/2006)  . TETANUS/TDAP  02/04/2019 (Originally 11/07/1975)  . Hepatitis C Screening  02/04/2019 (Originally 1955/09/29)  . HIV Screening  02/04/2019 (Originally 11/07/1971)  . INFLUENZA VACCINE  02/25/2018     Wt Readings from Last 3 Encounters:  02/08/18 152 lb 6.4 oz (69.1 kg)  02/03/18 152 lb (68.9 kg)  07/09/17 162 lb (73.5 kg)   BP Readings from Last 3 Encounters:  02/08/18 140/79  02/03/18 125/75  07/09/17 (!) 154/76   Pulse Readings from Last 3 Encounters:  02/08/18 66  02/03/18 71  07/09/17 86     Past Medical History:  Diagnosis Date  . 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 Coronary artery disease    s/p 3- vessel bypass   . Diabetes mellitus without complication (HCC)   . Heart murmur   . Hyperlipidemia   . Hypertension   . Ischemic cardiomyopathy    40 to 45%  . Popliteal artery occlusion, left (HCC)    recent  status  post angioplasty  . Pulmonary embolism (HCC)    associated with DVT      Past Surgical History:  Procedure Laterality Date  . AORTIC VALVE REPLACEMENT (AVR)/CORONARY ARTERY BYPASS GRAFTING (CABG)  08/16/2010  VAN TRIGT   AV replacement w/19-mm mechanical St Jude valve ([id number]),[id number]  lima to LAD, SVG to distal circ,SVG to posterior descending  . ASD REPAIR    . CARDIAC CATHETERIZATION  08/08/2010   ,LAD 50% PROX,605 TANDEM SEGMENTAL STENOSIS,LEFT CIRC 60%-AV GROOVE, RGT  COR 40% PROX ON THE BEND ,80% PROX PDA,  . CORONARY ARTERY BYPASS GRAFT  08/16/2010   LIMA TO lad,svg to distal circ ,svg to posterior descending  . DOPPLER ECHOCARDIOGRAPHY  08/09/2010   EF35% to 40% ,bbicuspid ;severely  thickened ,severely calcified leaflets, LV normal  . LEA doppler  08/07/2010   left popliteal occlusion ,and anterior tibial complete occlusion  . NM MYOCAR PERF WALL MOTION  08/06/2010   EF 31%  LOW RISK SCAN  . nuc    . PV ANGIOGRAM  08/08/2010   successful PTA LEFT POPLITEAL      Family History  Problem Relation Age of Onset  . Cancer Mother   . Cancer Father   . CAD Father   . Heart attack Father 33       first MI, 2 major after that, multiple silent MIs  . Stroke Brother   . CAD Brother 48       first stent  . Heart attack Brother   . CAD Brother 24       CABG   . Hypertension Brother   . Heart attack Brother 45       died from MI      Social History   Substance and Sexual Activity  Drug Use No  ,   Social History   Substance and Sexual Activity  Alcohol Use Yes  . Alcohol/week: 4.2 oz  . Types: 7 Standard drinks or equivalent per week  ,   Social History   Tobacco Use  Smoking  Status Never Smoker  Smokeless Tobacco Never Used  ,   Social History   Substance and Sexual Activity  Sexual Activity Not Currently    Current Outpatient Medications on File Prior to Visit  Medication Sig Dispense Refill  . aspirin EC 81 MG tablet Take 81 mg by mouth daily.    . furosemide (LASIX) 40 MG tablet TAKE 1/2 TABLET BY MOUTH DAILY. 45 tablet 2  . JARDIANCE 25 MG TABS tablet Take 25 mg by mouth daily.  6  . levothyroxine (SYNTHROID, LEVOTHROID) 50 MCG tablet Take 50 mcg by mouth daily before breakfast.    . metFORMIN (GLUMETZA) 500 MG (MOD) 24 hr tablet Take 500 mg by mouth daily.     . metoprolol succinate (TOPROL-XL) 100 MG 24 hr tablet TAKE 1 TABLET BY MOUTH DAILY. TAKE WITH OR IMMEDIATELY FOLLOWING A MEAL. 90 tablet 2  . ramipril (ALTACE) 5 MG capsule Take 5 mg by mouth daily.    Marland Kitchen REPATHA SURECLICK 140 MG/ML SOAJ INJECT 140MG  SUBCUTANEOUSLY EVERY 14 DAYS 6 pen 3  . rosuvastatin (CRESTOR) 40 MG tablet TAKE 1 TABLET BY MOUTH DAILY. 90 tablet 2  . warfarin (COUMADIN) 2 MG tablet TAKE 1 TO 1 AND 1/2 TABLETS BY MOUTH DAILY AS DIRECTED BY COUMADIN CLINIC. 120 tablet 2  . zaleplon (SONATA) 5 MG capsule Take 5 mg by mouth at bedtime as needed.     No current facility-administered medications on file prior to visit.     Allergies: Niaspan [niacin er]  Review of Systems: General:   Denies fever, chills, unexplained weight loss.  Optho/Auditory:   Denies visual changes, blurred vision/LOV Respiratory:   Denies SOB, DOE more than baseline levels.  Cardiovascular:   Denies chest pain, palpitations, new onset peripheral edema  Gastrointestinal:   Denies nausea, vomiting, diarrhea.  Genitourinary: Denies dysuria, freq/ urgency, flank pain or discharge from genitals.  Endocrine:     Denies hot or cold intolerance,  polyuria, polydipsia. Musculoskeletal:   Denies unexplained myalgias, joint swelling, unexplained arthralgias, gait problems.  Skin:  Denies rash, suspicious  lesions Neurological:     Denies dizziness, unexplained weakness, numbness  Psychiatric/Behavioral:   Denies mood changes, suicidal or homicidal ideations, hallucinations    Objective:    Blood pressure 140/79, pulse 66, height 5\' 3"  (1.6 m), weight 152 lb 6.4 oz (69.1 kg), SpO2 99 %. Body mass index is 27 kg/m. General Appearance:    Alert, cooperative, no distress, appears stated age  Head:    Normocephalic, without obvious abnormality, atraumatic  Eyes:    PERRL, conjunctiva/corneas clear, EOM's intact, fundi    benign, both eyes  Ears:    Normal TM's and external ear canals, both ears  Nose:   Nares normal, septum midline, mucosa normal, no drainage    or sinus tenderness  Throat:   Lips w/o lesion, mucosa moist, and tongue normal; teeth and   gums normal  Neck:   Supple, symmetrical, trachea midline, no adenopathy;    thyroid:  no enlargement/tenderness/nodules; no carotid   bruit or JVD  Back:     Symmetric, no curvature, ROM normal, no CVA tenderness  Lungs:     Clear to auscultation bilaterally, respirations unlabored, no       Wh/ R/ R  Chest Wall:    No tenderness or gross deformity; normal excursion   Heart:    Regular rate and rhythm, S1 and S2 normal, systolic ejection murmur consistent with AVR, no rub or gallop  Breast Exam:   No tenderness, masses, or nipple abnormality b/l; no d/c  Abdomen:     Soft, non-tender, bowel sounds active all four quadrants, NO   G/R/R, no masses, no organomegaly  Genitalia:   Ext genitalia: without lesion, no rash or discharge, No         tenderness;  Cervix: WNL's w/o discharge or lesion;        Adnexa:  No tenderness or palpable masses   Rectal:   Deferred; patient will obtain colonoscopy in near future.  Extremities:   Extremities normal, atraumatic, no cyanosis or gross edema  Pulses:   2+ and symmetric all extremities  Skin:   Warm, dry, Skin color, texture, turgor normal, no obvious rashes or lesions Psych: No HI/SI, judgement and  insight good, Euthymic mood. Full Affect.  Neurologic:   CNII-XII intact, normal strength, sensation and reflexes    Throughout

## 2018-02-08 NOTE — Patient Instructions (Addendum)
- Call your insurance about vaccinations, such as Shingrix and TDAP, and let us know if you would like to obtain them here.  - CMA to recheck Bp today  - CMA Melissa to do DM foot exam today and order mammo, colonoscopy and DM eye exam today   Preventive Care for Adults, Female  A healthy lifestyle and preventive care can promote health and wellness. Preventive health guidelines for women include the following key practices.   A routine yearly physical is a good way to check with your health care provider about your health and preventive screening. It is a chance to share any concerns and updates on your health and to receive a thorough exam.   Visit your dentist for a routine exam and preventive care every 6 months. Brush your teeth twice a day and floss once a day. Good oral hygiene prevents tooth decay and gum disease.   The frequency of eye exams is based on your age, health, family medical history, use of contact lenses, and other factors. Follow your health care provider's recommendations for frequency of eye exams.   Eat a healthy diet. Foods like vegetables, fruits, whole grains, low-fat dairy products, and lean protein foods contain the nutrients you need without too many calories. Decrease your intake of foods high in solid fats, added sugars, and salt. Eat the right amount of calories for you.Get information about a proper diet from your health care provider, if necessary.   Regular physical exercise is one of the most important things you can do for your health. Most adults should get at least 150 minutes of moderate-intensity exercise (any activity that increases your heart rate and causes you to sweat) each week. In addition, most adults need muscle-strengthening exercises on 2 or more days a week.   Maintain a healthy weight. The body mass index (BMI) is a screening tool to identify possible weight problems. It provides an estimate of body fat based on height and weight.  Your health care provider can find your BMI, and can help you achieve or maintain a healthy weight.For adults 20 years and older:   - A BMI below 18.5 is considered underweight.   - A BMI of 18.5 to 24.9 is normal.   - A BMI of 25 to 29.9 is considered overweight.   - A BMI of 30 and above is considered obese.   Maintain normal blood lipids and cholesterol levels by exercising and minimizing your intake of trans and saturated fats.  Eat a balanced diet with plenty of fruit and vegetables. Blood tests for lipids and cholesterol should begin at age 35 and be repeated every 5 years minimum.  If your lipid or cholesterol levels are high, you are over 40, or you are at high risk for heart disease, you may need your cholesterol levels checked more frequently.Ongoing high lipid and cholesterol levels should be treated with medicines if diet and exercise are not working.   If you smoke, find out from your health care provider how to quit. If you do not use tobacco, do not start.   Lung cancer screening is recommended for adults aged 12-80 years who are at high risk for developing lung cancer because of a history of smoking. A yearly low-dose CT scan of the lungs is recommended for people who have at least a 30-pack-year history of smoking and are a current smoker or have quit within the past 15 years. A pack year of smoking is smoking an average of  1 pack of cigarettes a day for 1 year (for example: 1 pack a day for 30 years or 2 packs a day for 15 years). Yearly screening should continue until the smoker has stopped smoking for at least 15 years. Yearly screening should be stopped for people who develop a health problem that would prevent them from having lung cancer treatment.   If you are pregnant, do not drink alcohol. If you are breastfeeding, be very cautious about drinking alcohol. If you are not pregnant and choose to drink alcohol, do not have more than 1 drink per day. One drink is considered to  be 12 ounces (355 mL) of beer, 5 ounces (148 mL) of wine, or 1.5 ounces (44 mL) of liquor.   Avoid use of street drugs. Do not share needles with anyone. Ask for help if you need support or instructions about stopping the use of drugs.   High blood pressure causes heart disease and increases the risk of stroke. Your blood pressure should be checked at least yearly.  Ongoing high blood pressure should be treated with medicines if weight loss and exercise do not work.   If you are 52-28 years old, ask your health care provider if you should take aspirin to prevent strokes.   Diabetes screening involves taking a blood sample to check your fasting blood sugar level. This should be done once every 3 years, after age 100, if you are within normal weight and without risk factors for diabetes. Testing should be considered at a younger age or be carried out more frequently if you are overweight and have at least 1 risk factor for diabetes.   Breast cancer screening is essential preventive care for women. You should practice "breast self-awareness."  This means understanding the normal appearance and feel of your breasts and may include breast self-examination.  Any changes detected, no matter how small, should be reported to a health care provider.  Women in their 69s and 30s should have a clinical breast exam (CBE) by a health care provider as part of a regular health exam every 1 to 3 years.  After age 59, women should have a CBE every year.  Starting at age 54, women should consider having a mammogram (breast X-ray test) every year.  Women who have a family history of breast cancer should talk to their health care provider about genetic screening.  Women at a high risk of breast cancer should talk to their health care providers about having an MRI and a mammogram every year.   -Breast cancer gene (BRCA)-related cancer risk assessment is recommended for women who have family members with BRCA-related  cancers. BRCA-related cancers include breast, ovarian, tubal, and peritoneal cancers. Having family members with these cancers may be associated with an increased risk for harmful changes (mutations) in the breast cancer genes BRCA1 and BRCA2. Results of the assessment will determine the need for genetic counseling and BRCA1 and BRCA2 testing.   The Pap test is a screening test for cervical cancer. A Pap test can show cell changes on the cervix that might become cervical cancer if left untreated. A Pap test is a procedure in which cells are obtained and examined from the lower end of the uterus (cervix).   - Women should have a Pap test starting at age 18.   - Between ages 39 and 24, Pap tests should be repeated every 2 years.   - Beginning at age 73, you should have a Pap test every  3 years as long as the past 3 Pap tests have been normal.   - Some women have medical problems that increase the chance of getting cervical cancer. Talk to your health care provider about these problems. It is especially important to talk to your health care provider if a new problem develops soon after your last Pap test. In these cases, your health care provider may recommend more frequent screening and Pap tests.   - The above recommendations are the same for women who have or have not gotten the vaccine for human papillomavirus (HPV).   - If you had a hysterectomy for a problem that was not cancer or a condition that could lead to cancer, then you no longer need Pap tests. Even if you no longer need a Pap test, a regular exam is a good idea to make sure no other problems are starting.   - If you are between ages 32 and 38 years, and you have had normal Pap tests going back 10 years, you no longer need Pap tests. Even if you no longer need a Pap test, a regular exam is a good idea to make sure no other problems are starting.   - If you have had past treatment for cervical cancer or a condition that could lead to  cancer, you need Pap tests and screening for cancer for at least 20 years after your treatment.   - If Pap tests have been discontinued, risk factors (such as a new sexual partner) need to be reassessed to determine if screening should be resumed.   - The HPV test is an additional test that may be used for cervical cancer screening. The HPV test looks for the virus that can cause the cell changes on the cervix. The cells collected during the Pap test can be tested for HPV. The HPV test could be used to screen women aged 12 years and older, and should be used in women of any age who have unclear Pap test results. After the age of 59, women should have HPV testing at the same frequency as a Pap test.   Colorectal cancer can be detected and often prevented. Most routine colorectal cancer screening begins at the age of 22 years and continues through age 76 years. However, your health care provider may recommend screening at an earlier age if you have risk factors for colon cancer. On a yearly basis, your health care provider may provide home test kits to check for hidden blood in the stool.  Use of a small camera at the end of a tube, to directly examine the colon (sigmoidoscopy or colonoscopy), can detect the earliest forms of colorectal cancer. Talk to your health care provider about this at age 1, when routine screening begins. Direct exam of the colon should be repeated every 5 -10 years through age 79 years, unless early forms of pre-cancerous polyps or small growths are found.   People who are at an increased risk for hepatitis B should be screened for this virus. You are considered at high risk for hepatitis B if:  -You were born in a country where hepatitis B occurs often. Talk with your health care provider about which countries are considered high risk.  - Your parents were born in a high-risk country and you have not received a shot to protect against hepatitis B (hepatitis B vaccine).  - You  have HIV or AIDS.  - You use needles to inject street drugs.  - You  live with, or have sex with, someone who has Hepatitis B.  - You get hemodialysis treatment.  - You take certain medicines for conditions like cancer, organ transplantation, and autoimmune conditions.   Hepatitis C blood testing is recommended for all people born from [birth year] through 1964 and any individual with known risks for hepatitis C.   Practice safe sex. Use condoms and avoid high-risk sexual practices to reduce the spread of sexually transmitted infections (STIs). STIs include gonorrhea, chlamydia, syphilis, trichomonas, herpes, HPV, and human immunodeficiency virus (HIV). Herpes, HIV, and HPV are viral illnesses that have no cure. They can result in disability, cancer, and death. Sexually active women aged 87 years and younger should be checked for chlamydia. Older women with new or multiple partners should also be tested for chlamydia. Testing for other STIs is recommended if you are sexually active and at increased risk.   Osteoporosis is a disease in which the bones lose minerals and strength with aging. This can result in serious bone fractures or breaks. The risk of osteoporosis can be identified using a bone density scan. Women ages 50 years and over and women at risk for fractures or osteoporosis should discuss screening with their health care providers. Ask your health care provider whether you should take a calcium supplement or vitamin D to There are also several preventive steps women can take to avoid osteoporosis and resulting fractures or to keep osteoporosis from worsening. -->Recommendations include:  Eat a balanced diet high in fruits, vegetables, calcium, and vitamins.  Get enough calcium. The recommended total intake of is 1,200 mg daily; for best absorption, if taking supplements, divide doses into 250-500 mg doses throughout the day. Of the two types of calcium, calcium carbonate is best absorbed when  taken with food but calcium citrate can be taken on an empty stomach.  Get enough vitamin D. NAMS and the [place] recommend at least 1,000 IU per day for women age 44 and over who are at risk of vitamin D deficiency. Vitamin D deficiency can be caused by inadequate sun exposure (for example, those who live in [place]).  Avoid alcohol and smoking. Heavy alcohol intake (more than 7 drinks per week) increases the risk of falls and hip fracture and women smokers tend to lose bone more rapidly and have lower bone mass than nonsmokers. Stopping smoking is one of the most important changes women can make to improve their health and decrease risk for disease.  Be physically active every day. Weight-bearing exercise (for example, fast walking, hiking, jogging, and weight training) may strengthen bones or slow the rate of bone loss that comes with aging. Balancing and muscle-strengthening exercises can reduce the risk of falling and fracture.  Consider therapeutic medications. Currently, several types of effective drugs are available. Healthcare providers can recommend the type most appropriate for each woman.  Eliminate environmental factors that may contribute to accidents. Falls cause nearly 90% of all osteoporotic fractures, so reducing this risk is an important bone-health strategy. Measures include ample lighting, removing obstructions to walking, using nonskid rugs on floors, and placing mats and/or grab bars in showers.  Be aware of medication side effects. Some common medicines make bones weaker. These include a type of steroid drug called glucocorticoids used for arthritis and asthma, some antiseizure drugs, certain sleeping pills, treatments for endometriosis, and some cancer drugs. An overactive thyroid gland or using too much thyroid hormone for an underactive thyroid can also be a problem. If you are taking  these medicines, talk to your doctor about what you can do  to help protect your bones.reduce the rate of osteoporosis.    Menopause can be associated with physical symptoms and risks. Hormone replacement therapy is available to decrease symptoms and risks. You should talk to your health care provider about whether hormone replacement therapy is right for you.   Use sunscreen. Apply sunscreen liberally and repeatedly throughout the day. You should seek shade when your shadow is shorter than you. Protect yourself by wearing long sleeves, pants, a wide-brimmed hat, and sunglasses year round, whenever you are outdoors.   Once a month, do a whole body skin exam, using a mirror to look at the skin on your back. Tell your health care provider of new moles, moles that have irregular borders, moles that are larger than a pencil eraser, or moles that have changed in shape or color.   -Stay current with required vaccines (immunizations).   Influenza vaccine. All adults should be immunized every year.  Tetanus, diphtheria, and acellular pertussis (Td, Tdap) vaccine. Pregnant women should receive 1 dose of Tdap vaccine during each pregnancy. The dose should be obtained regardless of the length of time since the last dose. Immunization is preferred during the 27th 36th week of gestation. An adult who has not previously received Tdap or who does not know her vaccine status should receive 1 dose of Tdap. This initial dose should be followed by tetanus and diphtheria toxoids (Td) booster doses every 10 years. Adults with an unknown or incomplete history of completing a 3-dose immunization series with Td-containing vaccines should begin or complete a primary immunization series including a Tdap dose. Adults should receive a Td booster every 10 years.  Varicella vaccine. An adult without evidence of immunity to varicella should receive 2 doses or a second dose if she has previously received 1 dose. Pregnant females who do not have evidence of immunity should receive the  first dose after pregnancy. This first dose should be obtained before leaving the health care facility. The second dose should be obtained 4 8 weeks after the first dose.  Human papillomavirus (HPV) vaccine. Females aged 52 26 years who have not received the vaccine previously should obtain the 3-dose series. The vaccine is not recommended for use in pregnant females. However, pregnancy testing is not needed before receiving a dose. If a female is found to be pregnant after receiving a dose, no treatment is needed. In that case, the remaining doses should be delayed until after the pregnancy. Immunization is recommended for any person with an immunocompromised condition through the age of 80 years if she did not get any or all doses earlier. During the 3-dose series, the second dose should be obtained 4 8 weeks after the first dose. The third dose should be obtained 24 weeks after the first dose and 16 weeks after the second dose.  Zoster vaccine. One dose is recommended for adults aged 64 years or older unless certain conditions are present.  Measles, mumps, and rubella (MMR) vaccine. Adults born before [birth year] generally are considered immune to measles and mumps. Adults born in [birth year] or later should have 1 or more doses of MMR vaccine unless there is a contraindication to the vaccine or there is laboratory evidence of immunity to each of the three diseases. A routine second dose of MMR vaccine should be obtained at least 28 days after the first dose for students attending postsecondary schools, health care workers, or international travelers. People who  received inactivated measles vaccine or an unknown type of measles vaccine during 1963 1967 should receive 2 doses of MMR vaccine. People who received inactivated mumps vaccine or an unknown type of mumps vaccine before 1979 and are at high risk for mumps infection should consider immunization with 2 doses of MMR vaccine. For females of childbearing age, rubella  immunity should be determined. If there is no evidence of immunity, females who are not pregnant should be vaccinated. If there is no evidence of immunity, females who are pregnant should delay immunization until after pregnancy. Unvaccinated health care workers born before [birth year] who lack laboratory evidence of measles, mumps, or rubella immunity or laboratory confirmation of disease should consider measles and mumps immunization with 2 doses of MMR vaccine or rubella immunization with 1 dose of MMR vaccine.  Pneumococcal 13-valent conjugate (PCV13) vaccine. When indicated, a person who is uncertain of her immunization history and has no record of immunization should receive the PCV13 vaccine. An adult aged 10 years or older who has certain medical conditions and has not been previously immunized should receive 1 dose of PCV13 vaccine. This PCV13 should be followed with a dose of pneumococcal polysaccharide (PPSV23) vaccine. The PPSV23 vaccine dose should be obtained at least 8 weeks after the dose of PCV13 vaccine. An adult aged 61 years or older who has certain medical conditions and previously received 1 or more doses of PPSV23 vaccine should receive 1 dose of PCV13. The PCV13 vaccine dose should be obtained 1 or more years after the last PPSV23 vaccine dose.  Pneumococcal polysaccharide (PPSV23) vaccine. When PCV13 is also indicated, PCV13 should be obtained first. All adults aged 45 years and older should be immunized. An adult younger than age 23 years who has certain medical conditions should be immunized. Any person who resides in a nursing home or long-term care facility should be immunized. An adult smoker should be immunized. People with an immunocompromised condition and certain other conditions should receive both PCV13 and PPSV23 vaccines. People with human immunodeficiency virus (HIV) infection should be immunized as soon as possible after diagnosis. Immunization during chemotherapy or radiation  therapy should be avoided. Routine use of PPSV23 vaccine is not recommended for American Indians, [place] Natives, or people younger than 65 years unless there are medical conditions that require PPSV23 vaccine. When indicated, people who have unknown immunization and have no record of immunization should receive PPSV23 vaccine. One-time revaccination 5 years after the first dose of PPSV23 is recommended for people aged 97 64 years who have chronic kidney failure, nephrotic syndrome, asplenia, or immunocompromised conditions. People who received 1 2 doses of PPSV23 before age 81 years should receive another dose of PPSV23 vaccine at age 30 years or later if at least 5 years have passed since the previous dose. Doses of PPSV23 are not needed for people immunized with PPSV23 at or after age 74 years.  Meningococcal vaccine. Adults with asplenia or persistent complement component deficiencies should receive 2 doses of quadrivalent meningococcal conjugate (MenACWY-D) vaccine. The doses should be obtained at least 2 months apart. Microbiologists working with certain meningococcal bacteria, [employer] recruits, people at risk during an outbreak, and people who travel to or live in countries with a high rate of meningitis should be immunized. A first-year college student up through age 77 years who is living in a residence hall should receive a dose if she did not receive a dose on or after her 16th birthday. Adults who have certain high-risk conditions should  receive one or more doses of vaccine.  Hepatitis A vaccine. Adults who wish to be protected from this disease, have certain high-risk conditions, work with hepatitis A-infected animals, work in hepatitis A research labs, or travel to or work in countries with a high rate of hepatitis A should be immunized. Adults who were previously unvaccinated and who anticipate close contact with an international adoptee during the first 60 days after arrival in the [place]  from a country with a high rate of hepatitis A should be immunized.  Hepatitis B vaccine.  Adults who wish to be protected from this disease, have certain high-risk conditions, may be exposed to blood or other infectious body fluids, are household contacts or sex partners of hepatitis B positive people, are clients or workers in certain care facilities, or travel to or work in countries with a high rate of hepatitis B should be immunized.  Haemophilus influenzae type b (Hib) vaccine. A previously unvaccinated person with asplenia or sickle cell disease or having a scheduled splenectomy should receive 1 dose of Hib vaccine. Regardless of previous immunization, a recipient of a hematopoietic stem cell transplant should receive a 3-dose series 6 12 months after her successful transplant. Hib vaccine is not recommended for adults with HIV infection.  Preventive Services / Frequency Ages 25 to 39years  Blood pressure check.** / Every 1 to 2 years.  Lipid and cholesterol check.** / Every 5 years beginning at age 1.  Clinical breast exam.** / Every 3 years for women in their 53s and 21s.  BRCA-related cancer risk assessment.** / For women who have family members with a BRCA-related cancer (breast, ovarian, tubal, or peritoneal cancers).  Pap test.** / Every 2 years from ages 30 through 35. Every 3 years starting at age 45 through age 86 or 57 with a history of 3 consecutive normal Pap tests.  HPV screening.** / Every 3 years from ages 38 through ages 52 to 38 with a history of 3 consecutive normal Pap tests.  Hepatitis C blood test.** / For any individual with known risks for hepatitis C.  Skin self-exam. / Monthly.  Influenza vaccine. / Every year.  Tetanus, diphtheria, and acellular pertussis (Tdap, Td) vaccine.** / Consult your health care provider. Pregnant women should receive 1 dose of Tdap vaccine during each pregnancy. 1 dose of Td every 10 years.  Varicella vaccine.** / Consult your  health care provider. Pregnant females who do not have evidence of immunity should receive the first dose after pregnancy.  HPV vaccine. / 3 doses over 6 months, if 75 and younger. The vaccine is not recommended for use in pregnant females. However, pregnancy testing is not needed before receiving a dose.  Measles, mumps, rubella (MMR) vaccine.** / You need at least 1 dose of MMR if you were born in 1957 or later. You may also need a 2nd dose. For females of childbearing age, rubella immunity should be determined. If there is no evidence of immunity, females who are not pregnant should be vaccinated. If there is no evidence of immunity, females who are pregnant should delay immunization until after pregnancy.  Pneumococcal 13-valent conjugate (PCV13) vaccine.** / Consult your health care provider.  Pneumococcal polysaccharide (PPSV23) vaccine.** / 1 to 2 doses if you smoke cigarettes or if you have certain conditions.  Meningococcal vaccine.** / 1 dose if you are age 37 to 84 years and a Market researcher living in a residence hall, or have one of several medical conditions, you need  to get vaccinated against meningococcal disease. You may also need additional booster doses.  Hepatitis A vaccine.** / Consult your health care provider.  Hepatitis B vaccine.** / Consult your health care provider.  Haemophilus influenzae type b (Hib) vaccine.** / Consult your health care provider.  Ages 43 to 64years  Blood pressure check.** / Every 1 to 2 years.  Lipid and cholesterol check.** / Every 5 years beginning at age 60 years.  Lung cancer screening. / Every year if you are aged 89 80 years and have a 30-pack-year history of smoking and currently smoke or have quit within the past 15 years. Yearly screening is stopped once you have quit smoking for at least 15 years or develop a health problem that would prevent you from having lung cancer treatment.  Clinical breast exam.** / Every year  after age 10 years.  BRCA-related cancer risk assessment.** / For women who have family members with a BRCA-related cancer (breast, ovarian, tubal, or peritoneal cancers).  Mammogram.** / Every year beginning at age 28 years and continuing for as long as you are in good health. Consult with your health care provider.  Pap test.** / Every 3 years starting at age 15 years through age 41 or 33 years with a history of 3 consecutive normal Pap tests.  HPV screening.** / Every 3 years from ages 44 years through ages 45 to 62 years with a history of 3 consecutive normal Pap tests.  Fecal occult blood test (FOBT) of stool. / Every year beginning at age 36 years and continuing until age 58 years. You may not need to do this test if you get a colonoscopy every 10 years.  Flexible sigmoidoscopy or colonoscopy.** / Every 5 years for a flexible sigmoidoscopy or every 10 years for a colonoscopy beginning at age 39 years and continuing until age 44 years.  Hepatitis C blood test.** / For all people born from [birth year] through 1964 and any individual with known risks for hepatitis C.  Skin self-exam. / Monthly.  Influenza vaccine. / Every year.  Tetanus, diphtheria, and acellular pertussis (Tdap/Td) vaccine.** / Consult your health care provider. Pregnant women should receive 1 dose of Tdap vaccine during each pregnancy. 1 dose of Td every 10 years.  Varicella vaccine.** / Consult your health care provider. Pregnant females who do not have evidence of immunity should receive the first dose after pregnancy.  Zoster vaccine.** / 1 dose for adults aged 52 years or older.  Measles, mumps, rubella (MMR) vaccine.** / You need at least 1 dose of MMR if you were born in 1957 or later. You may also need a 2nd dose. For females of childbearing age, rubella immunity should be determined. If there is no evidence of immunity, females who are not pregnant should be vaccinated. If there is no evidence of immunity, females  who are pregnant should delay immunization until after pregnancy.  Pneumococcal 13-valent conjugate (PCV13) vaccine.** / Consult your health care provider.  Pneumococcal polysaccharide (PPSV23) vaccine.** / 1 to 2 doses if you smoke cigarettes or if you have certain conditions.  Meningococcal vaccine.** / Consult your health care provider.  Hepatitis A vaccine.** / Consult your health care provider.  Hepatitis B vaccine.** / Consult your health care provider.  Haemophilus influenzae type b (Hib) vaccine.** / Consult your health care provider.  Ages 35 years and over  Blood pressure check.** / Every 1 to 2 years.  Lipid and cholesterol check.** / Every 5 years beginning at age 70 years.  Lung cancer screening. / Every year if you are aged 5 80 years and have a 30-pack-year history of smoking and currently smoke or have quit within the past 15 years. Yearly screening is stopped once you have quit smoking for at least 15 years or develop a health problem that would prevent you from having lung cancer treatment.  Clinical breast exam.** / Every year after age 52 years.  BRCA-related cancer risk assessment.** / For women who have family members with a BRCA-related cancer (breast, ovarian, tubal, or peritoneal cancers).  Mammogram.** / Every year beginning at age 22 years and continuing for as long as you are in good health. Consult with your health care provider.  Pap test.** / Every 3 years starting at age 26 years through age 48 or 28 years with 3 consecutive normal Pap tests. Testing can be stopped between 65 and 70 years with 3 consecutive normal Pap tests and no abnormal Pap or HPV tests in the past 10 years.  HPV screening.** / Every 3 years from ages 30 years through ages 57 or 5 years with a history of 3 consecutive normal Pap tests. Testing can be stopped between 65 and 70 years with 3 consecutive normal Pap tests and no abnormal Pap or HPV tests in the past 10 years.  Fecal  occult blood test (FOBT) of stool. / Every year beginning at age 75 years and continuing until age 61 years. You may not need to do this test if you get a colonoscopy every 10 years.  Flexible sigmoidoscopy or colonoscopy.** / Every 5 years for a flexible sigmoidoscopy or every 10 years for a colonoscopy beginning at age 6 years and continuing until age 26 years.  Hepatitis C blood test.** / For all people born from [birth year] through 1964 and any individual with known risks for hepatitis C.  Osteoporosis screening.** / A one-time screening for women ages 71 years and over and women at risk for fractures or osteoporosis.  Skin self-exam. / Monthly.  Influenza vaccine. / Every year.  Tetanus, diphtheria, and acellular pertussis (Tdap/Td) vaccine.** / 1 dose of Td every 10 years.  Varicella vaccine.** / Consult your health care provider.  Zoster vaccine.** / 1 dose for adults aged 70 years or older.  Pneumococcal 13-valent conjugate (PCV13) vaccine.** / Consult your health care provider.  Pneumococcal polysaccharide (PPSV23) vaccine.** / 1 dose for all adults aged 75 years and older.  Meningococcal vaccine.** / Consult your health care provider.  Hepatitis A vaccine.** / Consult your health care provider.  Hepatitis B vaccine.** / Consult your health care provider.  Haemophilus influenzae type b (Hib) vaccine.** / Consult your health care provider. ** Family history and personal history of risk and conditions may change your health care provider's recommendations. Document Released: 09/09/2001 Document Revised: 05/04/2013  [facility] Patient Information 2014 [place], [place].   EXERCISE AND DIET:  We recommended that you start or continue a regular exercise program for good health. Regular exercise means any activity that makes your heart beat faster and makes you sweat.  We recommend exercising at least 30 minutes per day at least 3 days a week, preferably 5.  We also recommend a diet low in  fat and sugar / carbohydrates.  Inactivity, poor dietary choices and obesity can cause diabetes, heart attack, stroke, and kidney damage, among others.     ALCOHOL AND SMOKING:  Women should limit their alcohol intake to no more than 7 drinks/beers/glasses of wine (combined, not each!) per week. Moderation  of alcohol intake to this level decreases your risk of breast cancer and liver damage.  ( And of course, no recreational drugs are part of a healthy lifestyle.)  Also, you should not be smoking at all or even being exposed to second hand smoke. Most people know smoking can cause cancer, and various heart and lung diseases, but did you know it also contributes to weakening of your bones?  Aging of your skin?  Yellowing of your teeth and nails?   CALCIUM AND VITAMIN D:  Adequate intake of calcium and Vitamin D are recommended.  The recommendations for exact amounts of these supplements seem to change often, but generally speaking 600 mg of calcium (either carbonate or citrate) and 800 units of Vitamin D per day seems prudent. Certain women may benefit from higher intake of Vitamin D.  If you are among these women, your doctor will have told you during your visit.     PAP SMEARS:  Pap smears, to check for cervical cancer or precancers,  have traditionally been done yearly, although recent scientific advances have shown that most women can have pap smears less often.  However, every woman still should have a physical exam from her gynecologist or primary care physician every year. It will include a breast check, inspection of the vulva and vagina to check for abnormal growths or skin changes, a visual exam of the cervix, and then an exam to evaluate the size and shape of the uterus and ovaries.  And after 62 years of age, a rectal exam is indicated to check for rectal cancers. We will also provide age appropriate advice regarding health maintenance, like when you should have certain vaccines, screening for  sexually transmitted diseases, bone density testing, colonoscopy, mammograms, etc.    MAMMOGRAMS:  All women over 75 years old should have a yearly mammogram. Many facilities now offer a "3D" mammogram, which may cost around $50 extra out of pocket. If possible,  we recommend you accept the option to have the 3D mammogram performed.  It both reduces the number of women who will be called back for extra views which then turn out to be normal, and it is better than the routine mammogram at detecting truly abnormal areas.     COLONOSCOPY:  Colonoscopy to screen for colon cancer is recommended for all women at age 16.  We know, you hate the idea of the prep.  We agree, BUT, having colon cancer and not knowing it is worse!!  Colon cancer so often starts as a polyp that can be seen and removed at colonscopy, which can quite literally save your life!  And if your first colonoscopy is normal and you have no family history of colon cancer, most women don't have to have it again for 10 years.  Once every ten years, you can do something that may end up saving your life, right?  We will be happy to help you get it scheduled when you are ready.  Be sure to check your insurance coverage so you understand how much it will cost.  It may be covered as a preventative service at no cost, but you should check your particular policy.

## 2018-02-09 LAB — CBC WITH DIFFERENTIAL/PLATELET
BASOS: 1 %
Basophils Absolute: 0 10*3/uL (ref 0.0–0.2)
EOS (ABSOLUTE): 0.1 10*3/uL (ref 0.0–0.4)
EOS: 2 %
HEMATOCRIT: 45.8 % (ref 34.0–46.6)
Hemoglobin: 14.9 g/dL (ref 11.1–15.9)
Immature Grans (Abs): 0 10*3/uL (ref 0.0–0.1)
Immature Granulocytes: 0 %
LYMPHS ABS: 2.3 10*3/uL (ref 0.7–3.1)
Lymphs: 44 %
MCH: 30 pg (ref 26.6–33.0)
MCHC: 32.5 g/dL (ref 31.5–35.7)
MCV: 92 fL (ref 79–97)
MONOS ABS: 0.5 10*3/uL (ref 0.1–0.9)
Monocytes: 9 %
NEUTROS ABS: 2.4 10*3/uL (ref 1.4–7.0)
NEUTROS PCT: 44 %
PLATELETS: 234 10*3/uL (ref 150–450)
RBC: 4.97 x10E6/uL (ref 3.77–5.28)
RDW: 13.3 % (ref 12.3–15.4)
WBC: 5.3 10*3/uL (ref 3.4–10.8)

## 2018-02-09 LAB — HEMOGLOBIN A1C
Est. average glucose Bld gHb Est-mCnc: 154 mg/dL
Hgb A1c MFr Bld: 7 % — ABNORMAL HIGH (ref 4.8–5.6)

## 2018-02-09 LAB — COMPREHENSIVE METABOLIC PANEL
A/G RATIO: 1.9 (ref 1.2–2.2)
ALBUMIN: 5 g/dL — AB (ref 3.6–4.8)
ALT: 36 IU/L — ABNORMAL HIGH (ref 0–32)
AST: 27 IU/L (ref 0–40)
Alkaline Phosphatase: 59 IU/L (ref 39–117)
BUN / CREAT RATIO: 26 (ref 12–28)
BUN: 19 mg/dL (ref 8–27)
Bilirubin Total: 0.6 mg/dL (ref 0.0–1.2)
CALCIUM: 9.8 mg/dL (ref 8.7–10.3)
CO2: 22 mmol/L (ref 20–29)
CREATININE: 0.73 mg/dL (ref 0.57–1.00)
Chloride: 99 mmol/L (ref 96–106)
GFR, EST AFRICAN AMERICAN: 103 mL/min/{1.73_m2} (ref >59)
GFR, EST NON AFRICAN AMERICAN: 89 mL/min/{1.73_m2} (ref >59)
GLOBULIN, TOTAL: 2.7 g/dL (ref 1.5–4.5)
Glucose: 139 mg/dL — ABNORMAL HIGH (ref 65–99)
POTASSIUM: 3.9 mmol/L (ref 3.5–5.2)
SODIUM: 140 mmol/L (ref 134–144)
Total Protein: 7.7 g/dL (ref 6.0–8.5)

## 2018-02-09 LAB — CYTOLOGY - PAP
Diagnosis: NEGATIVE
HPV: NOT DETECTED

## 2018-02-09 LAB — T4, FREE: Free T4: 1.81 ng/dL — ABNORMAL HIGH (ref 0.82–1.77)

## 2018-02-09 LAB — HEPATITIS C ANTIBODY: Hep C Virus Ab: <0.1 s/co ratio (ref 0.0–0.9)

## 2018-02-09 LAB — LIPID PANEL
CHOLESTEROL TOTAL: 126 mg/dL (ref 100–199)
Chol/HDL Ratio: 2.7 ratio (ref 0.0–4.4)
HDL: 46 mg/dL (ref >39)
LDL CALC: 58 mg/dL (ref 0–99)
TRIGLYCERIDES: 110 mg/dL (ref 0–149)
VLDL CHOLESTEROL CAL: 22 mg/dL (ref 5–40)

## 2018-02-09 LAB — MICROALBUMIN / CREATININE URINE RATIO
CREATININE, UR: 11.9 mg/dL
Microalb/Creat Ratio: <25.2 mg/g creat (ref 0.0–30.0)
Microalbumin, Urine: <3 ug/mL

## 2018-02-09 LAB — MAGNESIUM: MAGNESIUM: 2 mg/dL (ref 1.6–2.3)

## 2018-02-09 LAB — VITAMIN D 25 HYDROXY (VIT D DEFICIENCY, FRACTURES): VIT D 25 HYDROXY: 33.1 ng/mL (ref 30.0–100.0)

## 2018-02-09 LAB — HIV ANTIBODY (ROUTINE TESTING W REFLEX): HIV Screen 4th Generation wRfx: NONREACTIVE

## 2018-02-09 LAB — TSH: TSH: 1.02 u[IU]/mL (ref 0.450–4.500)

## 2018-02-24 ENCOUNTER — Ambulatory Visit (INDEPENDENT_AMBULATORY_CARE_PROVIDER_SITE_OTHER): Payer: BC Managed Care – PPO | Admitting: Pharmacist Clinician (PhC)/ Clinical Pharmacy Specialist

## 2018-02-24 DIAGNOSIS — Z952 Presence of prosthetic heart valve: Secondary | ICD-10-CM

## 2018-02-24 DIAGNOSIS — I2699 Other pulmonary embolism without acute cor pulmonale: Secondary | ICD-10-CM

## 2018-02-24 DIAGNOSIS — Z7901 Long term (current) use of anticoagulants: Secondary | ICD-10-CM | POA: Diagnosis not present

## 2018-02-24 LAB — POCT INR: INR: 2.2 (ref 2.0–3.0)

## 2018-02-25 ENCOUNTER — Ambulatory Visit: Payer: BC Managed Care – PPO | Admitting: Physician Assistant

## 2018-02-25 ENCOUNTER — Encounter: Payer: Self-pay | Admitting: Physician Assistant

## 2018-02-25 VITALS — BP 132/62 | HR 85 | Ht 63.0 in | Wt 150.0 lb

## 2018-02-25 DIAGNOSIS — Z7901 Long term (current) use of anticoagulants: Secondary | ICD-10-CM | POA: Diagnosis not present

## 2018-02-25 DIAGNOSIS — Z952 Presence of prosthetic heart valve: Secondary | ICD-10-CM | POA: Diagnosis not present

## 2018-02-25 DIAGNOSIS — Z01818 Encounter for other preprocedural examination: Secondary | ICD-10-CM | POA: Diagnosis not present

## 2018-02-25 DIAGNOSIS — Z1212 Encounter for screening for malignant neoplasm of rectum: Secondary | ICD-10-CM

## 2018-02-25 DIAGNOSIS — Z1211 Encounter for screening for malignant neoplasm of colon: Secondary | ICD-10-CM

## 2018-02-25 DIAGNOSIS — Z951 Presence of aortocoronary bypass graft: Secondary | ICD-10-CM

## 2018-02-25 MED ORDER — NA SULFATE-K SULFATE-MG SULF 17.5-3.13-1.6 GM/177ML PO SOLN: 1.0000 | ORAL | 0 refills | Status: DC

## 2018-02-25 NOTE — Progress Notes (Addendum)
Chief Complaint: Preprocedural visit for screening colonoscopy in a patient on chronic anticoagulation  HPI:    Katrina Obrien is a 62 year old Caucasian female with a past medical history as listed below including severe aortic valve stenosis status post aortic valve replacement 8 years ago as well as CAD and ischemic cardiomyopathy (EF 40-45% in 2017), who was referred to me by Mellody Dance, DO for a preprocedural visit for a screening colonoscopy.      Today, explains that she has done well since they figured out her "heart problems" 8 years ago.  She has been on Coumadin since that time.  She has never had to hold this for any other procedure in the past.  She denies any GI complaints.  Denies family history of colon cancer.    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    Denies fever, chills, blood in her stool, melena, change in bowel habits, abdominal pain, heartburn or reflux.  Past Medical History:  Diagnosis Date  . 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 Coronary artery disease    s/p 3- vessel bypass   . Diabetes mellitus without complication ([place])   . Heart murmur   . Hyperlipidemia   . Hypertension   . Ischemic cardiomyopathy    40 to 45%  . Popliteal artery occlusion, left ([facility])    recent  status  post angioplasty  . Pulmonary embolism ([place])    associated with DVT    Past Surgical History:  Procedure Laterality Date  . AORTIC VALVE REPLACEMENT (AVR)/CORONARY ARTERY BYPASS GRAFTING (CABG)  08/16/2010  VAN TRIGT   AV replacement w/19-mm mechanical St Jude valve ([id number]),CABGX3  lima to LAD, SVG to distal circ,SVG to posterior descending  . ASD REPAIR    . CARDIAC CATHETERIZATION  08/08/2010   ,LAD 50% PROX,605 TANDEM SEGMENTAL STENOSIS,LEFT CIRC 60%-AV GROOVE, RGT COR 40% PROX ON THE BEND ,80% PROX PDA,  . CORONARY ARTERY BYPASS GRAFT  08/16/2010   LIMA TO lad,svg to  distal circ ,svg to posterior descending  . DOPPLER ECHOCARDIOGRAPHY  08/09/2010   EF35% to 40% ,bbicuspid ;severely  thickened ,severely calcified leaflets, LV normal  . LEA doppler  08/07/2010   left popliteal occlusion ,and anterior tibial complete occlusion  . NM MYOCAR PERF WALL MOTION  08/06/2010   EF 31%  LOW RISK SCAN  . nuc    . PV ANGIOGRAM  08/08/2010   successful PTA LEFT POPLITEAL    Current Outpatient Medications  Medication Sig Dispense Refill  . aspirin EC 81 MG tablet Take 81 mg by mouth daily.    . furosemide (LASIX) 40 MG tablet TAKE 1/2 TABLET BY MOUTH DAILY. 45 tablet 2  . JARDIANCE 25 MG TABS tablet Take 25 mg by mouth daily.  6  . levothyroxine (SYNTHROID, LEVOTHROID) 50 MCG tablet Take 50 mcg by mouth daily before breakfast.    . metFORMIN (GLUMETZA) 500 MG (MOD) 24 hr tablet Take 500 mg by mouth daily.     . metoprolol succinate (TOPROL-XL) 100 MG 24 hr tablet TAKE 1 TABLET BY MOUTH DAILY. TAKE WITH OR IMMEDIATELY FOLLOWING A MEAL. 90 tablet 2  . ramipril (ALTACE) 5 MG capsule Take 5 mg by mouth daily.    Marland Kitchen REPATHA SURECLICK 782 MG/ML SOAJ INJECT 140MG  SUBCUTANEOUSLY EVERY 14 DAYS 6 pen 3  . rosuvastatin (CRESTOR) 40 MG tablet TAKE 1 TABLET BY MOUTH DAILY. [place]augh  tablet 2  . warfarin (COUMADIN) 2 MG tablet TAKE 1 TO 1 AND 1/2 TABLETS BY MOUTH DAILY AS DIRECTED BY COUMADIN CLINIC. 120 tablet 2  . zaleplon (SONATA) 5 MG capsule Take 5 mg by mouth at bedtime as needed.     No current facility-administered medications for this visit.     Allergies as of 02/25/2018 - Review Complete 02/25/2018  Allergen Reaction Noted  . Niaspan [niacin er] Other (See Comments) 08/17/2014    Family History  Problem Relation Age of Onset  . Cancer Mother   . Cancer Father   . CAD Father   . Heart attack Father 97       first MI, 2 major after that, multiple silent MIs  . Stroke Brother   . CAD Brother 16       first stent  . Heart attack Brother   . CAD Brother 41        CABG   . Hypertension Brother   . Heart attack Brother 60       died from MI    Social History   Socioeconomic History  . Marital status: Married    Spouse name: Not on file  . Number of children: Not on file  . Years of education: Not on file  . Highest education level: Not on file  Occupational History  . Not on file  Social Needs  . Financial resource strain: Not on file  . Food insecurity:    Worry: Not on file    Inability: Not on file  . Transportation needs:    Medical: Not on file    Non-medical: Not on file  Tobacco Use  . Smoking status: Never Smoker  . Smokeless tobacco: Never Used  Substance and Sexual Activity  . Alcohol use: Yes    Alcohol/week: 4.2 oz    Types: 7 Standard drinks or equivalent per week  . Drug use: No  . Sexual activity: Not Currently  Lifestyle  . Physical activity:    Days per week: Not on file    Minutes per session: Not on file  . Stress: Not on file  Relationships  . Social connections:    Talks on phone: Not on file    Gets together: Not on file    Attends religious service: Not on file    Active member of club or organization: Not on file    Attends meetings of clubs or organizations: Not on file    Relationship status: Not on file  . Intimate partner violence:    Fear of current or ex partner: Not on file    Emotionally abused: Not on file    Physically abused: Not on file    Forced sexual activity: Not on file  Other Topics Concern  . Not on file  Social History Narrative  . Not on file    Review of Systems:    Constitutional: No weight loss, fever or chills Skin: No rash  Cardiovascular: No chest pain Respiratory: No SOB  Gastrointestinal: See HPI and otherwise negative Genitourinary: No dysuria Neurological: No headache, dizziness or syncope Musculoskeletal: No new muscle or joint pain Hematologic: No bleeding Psychiatric: No history of depression or anxiety   Physical Exam:  Vital signs: BP 132/62    Pulse 85   Ht 5\' 3"  (1.6 m)   Wt 150 lb (68 kg)   LMP  (LMP Unknown)   BMI 26.57 kg/m   Constitutional:   Pleasant Caucasian female appears  to be in NAD, Well developed, Well nourished, alert and cooperative Head:  Normocephalic and atraumatic. Eyes:   PEERL, EOMI. No icterus. Conjunctiva pink. Ears:  Normal auditory acuity. Neck:  Supple Throat: Oral cavity and pharynx without inflammation, swelling or lesion.  Respiratory: Respirations even and unlabored. Lungs clear to auscultation bilaterally.   No wheezes, crackles, or rhonchi.  Cardiovascular: Normal S1, S2. No MRG. Regular rate and rhythm. No peripheral edema, cyanosis or pallor.  Gastrointestinal:  Soft, nondistended, nontender. No rebound or guarding. Normal bowel sounds. No appreciable masses or hepatomegaly. Rectal:  Not performed.  Msk:  Symmetrical without gross deformities. Without edema, no deformity or joint abnormality.  Neurologic:  Alert and  oriented x4;  grossly normal neurologically.  Skin:   Dry and intact without significant lesions or rashes. Psychiatric: Demonstrates good judgement and reason without abnormal affect or behaviors.  MOST RECENT LABS AND IMAGING: CBC    Component Value Date/Time   WBC 5.3 02/08/2018 0911   WBC 11.2 (H) 08/20/2010 0839   RBC 4.97 02/08/2018 0911   RBC 3.00 (L) 08/20/2010 0839   HGB 14.9 02/08/2018 0911   HCT 45.8 02/08/2018 0911   PLT 234 02/08/2018 0911   MCV 92 02/08/2018 0911   MCH 30.0 02/08/2018 0911   MCH 28.7 08/20/2010 0839   MCHC 32.5 02/08/2018 0911   MCHC 32.8 08/20/2010 0839   RDW 13.3 02/08/2018 0911   LYMPHSABS 2.3 02/08/2018 0911   MONOABS 0.6 08/07/2010 2123   EOSABS 0.1 02/08/2018 0911   BASOSABS 0.0 02/08/2018 0911    CMP     Component Value Date/Time   NA 140 02/08/2018 0911   K 3.9 02/08/2018 0911   CL 99 02/08/2018 0911   CO2 22 02/08/2018 0911   GLUCOSE 139 (H) 02/08/2018 0911   GLUCOSE 157 (H) 07/17/2014 0852   BUN 19 02/08/2018 0911     CREATININE 0.73 02/08/2018 0911   CREATININE 0.69 07/17/2014 0852   CALCIUM 9.8 02/08/2018 0911   PROT 7.7 02/08/2018 0911   ALBUMIN 5.0 (H) 02/08/2018 0911   AST 27 02/08/2018 0911   ALT 36 (H) 02/08/2018 0911   ALKPHOS 59 02/08/2018 0911   BILITOT 0.6 02/08/2018 0911   GFRNONAA 89 02/08/2018 0911   GFRAA 103 02/08/2018 0911    Assessment: 1.  Preprocedural visit for her screening colonoscopy: Patient is now 42 and never had a colonoscopy, no family history of colon cancer 2.  Chronic anticoagulation: With Coumadin for previous aortic valve replacement and CAD  Plan: 1.  Scheduled patient for a screening colonoscopy in the [facility] with Dr. Hilarie Fredrickson.  Did discuss risk, benefits, limitations and alternatives and the patient agrees to proceed. 2.  Recommend the patient hold her Coumadin for 5 days prior to time for procedure.  We will contact Dr. Debara Pickett to ensure that this is acceptable for her.  We did go over the increased risk of holding her anticoagulation including but not limited to stroke.  We also discussed that because of her mechanical valve, likely they will want her to go on interim anticoagulation, we will leave this to Dr. Debara Pickett.  3.  Patient to follow in clinic per recommendations from Dr. Hilarie Fredrickson after time of colonoscopy.  Ellouise Newer, PA-C [facility] 02/25/2018, 9:34 AM  Cc: Mellody Dance, DO   Addendum: Reviewed and agree with initial management. Pyrtle, Lajuan Lines, MD

## 2018-02-25 NOTE — Patient Instructions (Signed)

## 2018-02-26 ENCOUNTER — Encounter: Payer: Self-pay | Admitting: Internal Medicine

## 2018-03-01 ENCOUNTER — Ambulatory Visit
Admission: RE | Admit: 2018-03-01 | Discharge: 2018-03-01 | Disposition: A | Discharge status: Home / Self Care | Payer: BC Managed Care – PPO | Source: Ambulatory Visit | Attending: Family Medicine | Admitting: Family Medicine

## 2018-03-01 ENCOUNTER — Encounter: Payer: Self-pay | Admitting: Internal Medicine

## 2018-03-01 DIAGNOSIS — Z1239 Encounter for other screening for malignant neoplasm of breast: Secondary | ICD-10-CM

## 2018-03-04 ENCOUNTER — Telehealth: Payer: Self-pay | Admitting: Emergency Medicine

## 2018-03-04 ENCOUNTER — Telehealth: Payer: Self-pay

## 2018-03-04 NOTE — Telephone Encounter (Signed)
Hi Katrina Obrien,   I see you already spoke briefly with Katrina Obrien. [facility] Pre-operative Risk [facility]     Request for surgical clearance:     Endoscopy Procedure  What type of surgery is being performed?     colonoscopy When is this surgery scheduled?     03-09-18  What type of clearance is required ?   Pharmacy  Are there any medications that need to be held prior to surgery and how long? Coumadin  Practice name and name of physician performing surgery?      [facility]  What is your office phone and fax number?      Phone- [phone]  [phone]  Anesthesia type (None, local, MAC, general) ?       MAC

## 2018-03-04 NOTE — Telephone Encounter (Signed)
Patient with history of DVT/PE and mechanical aortic valve on warfarin for anticoagulation.    Procedure: colonoscopy Date of procedure: 03/09/18  CrCl 86.9 Platelet count 234  Per office protocol, patient can hold warfarin for 5 days prior to procedure.    Patient will need bridging with Lovenox (enoxaparin) around procedure.  We have made arrangements for patient to get lovenox training in the office Friday Aug 9.

## 2018-03-05 ENCOUNTER — Ambulatory Visit (INDEPENDENT_AMBULATORY_CARE_PROVIDER_SITE_OTHER): Payer: BC Managed Care – PPO | Admitting: Pharmacist Clinician (PhC)/ Clinical Pharmacy Specialist

## 2018-03-05 DIAGNOSIS — Z952 Presence of prosthetic heart valve: Secondary | ICD-10-CM

## 2018-03-05 DIAGNOSIS — I2699 Other pulmonary embolism without acute cor pulmonale: Secondary | ICD-10-CM

## 2018-03-05 DIAGNOSIS — Z7901 Long term (current) use of anticoagulants: Secondary | ICD-10-CM | POA: Diagnosis not present

## 2018-03-05 LAB — POCT INR: INR: 2 (ref 2.0–3.0)

## 2018-03-05 MED ORDER — ENOXAPARIN SODIUM 60 MG/0.6ML ~~LOC~~ SOLN: 60.0000 mg | Freq: Two times a day (BID) | SUBCUTANEOUS | 0 refills | Status: DC

## 2018-03-05 NOTE — Patient Instructions (Addendum)
   Aug 9: Inject Lovenox 60 mg in the fatty abdominal tissue at least 2 inches from the belly button twice a day about 12 hours apart,  8pm rotate sites. No Coumadin.  Aug 10: Inject Lovenox in the fatty tissue every 12 hours, 8am and 8pm. No Coumadin.  Aug 11: Inject Lovenox in the fatty tissue every 12 hours, 8am and 8pm. No Coumadin.  Aug 12: Inject Lovenox in the fatty tissue in the morning at 8 am (No PM dose). No Coumadin.  Aug 13: Procedure Day - No Lovenox - Resume Coumadin in the evening or as directed by doctor - Coumadin 1.5 tablets  Aug 14: Resume Lovenox inject in the fatty tissue every 12 hours and take Coumadin 1.5 tablets.  Aug 15: Inject Lovenox in the fatty tissue every 12 hours and take Coumadin 1.5 tablets .  Aug 16: Inject Lovenox in the fatty tissue every 12 hours and take Coumadin 1.5 tablets  Aug 17: Inject Lovenox in the fatty tissue every 12 hours and take Coumadin 1 tablet.  Aug 18: Inject Lovenox in the fatty tissue every 12 hours and take Coumadin 1 tablet.  Aug 19: Coumadin appt to check INR.

## 2018-03-05 NOTE — Telephone Encounter (Signed)
   Primary Cardiologist: Pixie Casino, MD  Chart reviewed as part of pre-operative protocol coverage. Patient was contacted 03/05/2018 in reference to pre-operative risk assessment for pending surgery as outlined below.  Katrina Obrien was last seen on 07/09/17 by Dr. Debara Pickett.  Since that day, Katrina Obrien has done very well.  She is gotten into an exercise routine has had no chest discomfort or dyspnea.  Therefore, based on ACC/AHA guidelines, the patient would be at acceptable risk for the planned procedure without further cardiovascular testing.   According to our pharmacy protocol:        Patient with history of DVT/PE and mechanical aortic valve on warfarin for anticoagulation.    Procedure: colonoscopy Date of procedure: 03/09/18  CrCl 86.9 Platelet count 234  Per office protocol, patient can hold warfarin for 5 days prior to procedure.    Patient will need bridging with Lovenox (enoxaparin) around procedure.  We have made arrangements for patient to get lovenox training in the office Friday Aug 9.        I will route this recommendation to the requesting party via Epic fax function and remove from pre-op pool.  Please call with questions.  Daune Perch, NP 03/05/2018, 10:27 AM

## 2018-03-08 NOTE — Telephone Encounter (Signed)
Patient was called by cardiology per their notes.

## 2018-03-09 ENCOUNTER — Encounter: Payer: Self-pay | Admitting: Internal Medicine

## 2018-03-09 ENCOUNTER — Ambulatory Visit (AMBULATORY_SURGERY_CENTER): Payer: BC Managed Care – PPO | Admitting: Internal Medicine

## 2018-03-09 VITALS — BP 122/57 | HR 56 | Temp 96.8°F | Resp 12 | Ht 63.0 in | Wt 150.0 lb

## 2018-03-09 DIAGNOSIS — Z1211 Encounter for screening for malignant neoplasm of colon: Secondary | ICD-10-CM

## 2018-03-09 DIAGNOSIS — K635 Polyp of colon: Secondary | ICD-10-CM

## 2018-03-09 DIAGNOSIS — D122 Benign neoplasm of ascending colon: Secondary | ICD-10-CM

## 2018-03-09 MED ORDER — SODIUM CHLORIDE 0.9 % IV SOLN: 500.0000 mL | Freq: Once | INTRAVENOUS | Status: DC

## 2018-03-09 NOTE — Op Note (Signed)
[facility] Patient Name: Katrina Obrien Procedure Date: 03/09/2018 9:02 AM MRN: [id number] Endoscopist: Beverley Fiedler , MD Age: 62 Referring MD:  Date of Birth: 06-Dec-1955 Gender: Female Account #: [id number] Procedure:                Colonoscopy Indications:              Screening for colorectal malignant neoplasm, This                            is the patient's first colonoscopy Medicines:                Monitored Anesthesia Care Procedure:                Pre-Anesthesia Assessment:                           - Prior to the procedure, a History and Physical                            was performed, and patient medications and                            allergies were reviewed. The patient's tolerance of                            previous anesthesia was also reviewed. The risks                            and benefits of the procedure and the sedation                            options and risks were discussed with the patient.                            All questions were answered, and informed consent                            was obtained. Prior Anticoagulants: The patient has                            taken no previous anticoagulant or antiplatelet                            agents. ASA Grade Assessment: III - A patient with                            severe systemic disease. After reviewing the risks                            and benefits, the patient was deemed in                            satisfactory condition to undergo the procedure.  Throughout the procedure, the patient's blood                            pressure, pulse, and oxygen saturations were                            monitored continuously. After obtaining informed                            consent, the endoscope was introduced through the                            anus and advanced to the cecum, identified by                            appendiceal orifice and ileocecal  valve. The                            patient tolerated the procedure well. The quality                            of the bowel preparation was good. The ileocecal                            valve, appendiceal orifice, and rectum were                            photographed. The colonoscopy was technically                            difficult and complex due to multiple diverticula                            in the colon and restricted mobility of the sigmoid                            colon. Successful completion of the procedure was                            aided by withdrawing the scope and replacing with                            the adult endoscope and changing position to supine. Scope In: 9:22:49 AM Scope Out: 9:45:30 AM Scope Withdrawal Time: 0 hours 14 minutes 30 seconds  Total Procedure Duration: 0 hours 22 minutes 41 seconds  Findings:                 The digital rectal exam was normal.                           A 7 mm polyp was found in the ascending colon. The                            polyp was flat.  The polyp was removed with a cold                            snare. Resection and retrieval were complete.                           Multiple small and large-mouthed diverticula were                            found in the recto-sigmoid colon, sigmoid colon,                            descending colon, hepatic flexure and ascending                            colon. There was narrowing of the sigmoid colon in                            association with the diverticular opening.                           The retroflexed view of the distal rectum and anal                            verge was normal and showed no anal or rectal                            abnormalities. Complications:            No immediate complications. Estimated Blood Loss:     Estimated blood loss was minimal. Impression:               - One 7 mm polyp in the ascending colon, removed                             with a cold snare. Resected and retrieved.                           - Diverticulosis in the recto-sigmoid colon, in the                            sigmoid colon, in the descending colon, at the                            hepatic flexure and in the ascending colon. There                            was narrowing of the colon in association with the                            diverticular opening.                           - The distal rectum and anal verge are normal on  retroflexion view. Recommendation:           - Patient has a contact number available for                            emergencies. The signs and symptoms of potential                            delayed complications were discussed with the                            patient. Return to normal activities tomorrow.                            Written discharge instructions were provided to the                            patient.                           - Resume previous diet.                           - Continue present medications.                           - Resume Coumadin (warfarin) today and Lovenox                            (enoxaparin) today at prior doses. Refer to                            managing physician for further adjustment of                            therapy.                           - Await pathology results.                           - Repeat colonoscopy is recommended. The                            colonoscopy date will be determined after pathology                            results from today's exam become available for                            review. Beverley Fiedler, MD 03/09/2018 9:51:53 AM This report has been signed electronically.

## 2018-03-09 NOTE — Progress Notes (Signed)
Called to room to assist during endoscopic procedure.  Patient ID and intended procedure confirmed with present staff. Received instructions for my participation in the procedure from the performing physician.  

## 2018-03-09 NOTE — Progress Notes (Signed)
Pt has a mechanical heart valve. maw

## 2018-03-09 NOTE — Progress Notes (Signed)
Report to PACU, RN, vss, BBS= Clear.  

## 2018-03-09 NOTE — Patient Instructions (Signed)
Handout given on polyps and diverticulosis.  Resume Coumadin and Lovenox today at prior doses.   YOU HAD AN ENDOSCOPIC PROCEDURE TODAY AT [facility]:   Refer to the procedure report that was given to you for any specific questions about what was found during the examination.  If the procedure report does not answer your questions, please call your gastroenterologist to clarify.  If you requested that your care partner not be given the details of your procedure findings, then the procedure report has been included in a sealed envelope for you to review at your convenience later.  YOU SHOULD EXPECT: Some feelings of bloating in the abdomen. Passage of more gas than usual.  Walking can help get rid of the air that was put into your GI tract during the procedure and reduce the bloating. If you had a lower endoscopy (such as a colonoscopy or flexible sigmoidoscopy) you may notice spotting of blood in your stool or on the toilet paper. If you underwent a bowel prep for your procedure, you may not have a normal bowel movement for a few days.  Please Note:  You might notice some irritation and congestion in your nose or some drainage.  This is from the oxygen used during your procedure.  There is no need for concern and it should clear up in a day or so.  SYMPTOMS TO REPORT IMMEDIATELY:   Following lower endoscopy (colonoscopy or flexible sigmoidoscopy):  Excessive amounts of blood in the stool  Significant tenderness or worsening of abdominal pains  Swelling of the abdomen that is new, acute  Fever of 100F or higher   For urgent or emergent issues, a gastroenterologist can be reached at any hour by calling [phone].   DIET:  We do recommend a small meal at first, but then you may proceed to your regular diet.  Drink plenty of fluids but you should avoid alcoholic beverages for 24 hours.  ACTIVITY:  You should plan to take it easy for the rest of today and you should NOT  DRIVE or use heavy machinery until tomorrow (because of the sedation medicines used during the test).    FOLLOW UP: Our staff will call the number listed on your records the next business day following your procedure to check on you and address any questions or concerns that you may have regarding the information given to you following your procedure. If we do not reach you, we will leave a message.  However, if you are feeling well and you are not experiencing any problems, there is no need to return our call.  We will assume that you have returned to your regular daily activities without incident.  If any biopsies were taken you will be contacted by phone or by letter within the next 1-3 weeks.  Please call us at [phone] if you have not heard about the biopsies in 3 weeks.    SIGNATURES/CONFIDENTIALITY: You and/or your care partner have signed paperwork which will be entered into your electronic medical record.  These signatures attest to the fact that that the information above on your After Visit Summary has been reviewed and is understood.  Full responsibility of the confidentiality of this discharge information lies with you and/or your care-partner.

## 2018-03-10 ENCOUNTER — Telehealth: Payer: Self-pay

## 2018-03-10 NOTE — Telephone Encounter (Signed)
  Follow up Call-  Call back number 03/09/2018  Post procedure Call Back phone  # [phone] cell  Permission to leave phone message Yes  Some recent data might be hidden     Patient questions:  Do you have a fever, pain , or abdominal swelling? No. Pain Score  0 *  Have you tolerated food without any problems? Yes.    Have you been able to return to your normal activities? Yes.    Do you have any questions about your discharge instructions: Diet   No. Medications  No. Follow up visit  No.  Do you have questions or concerns about your Care? No.  Actions: * If pain score is 4 or above: No action needed, pain <4.

## 2018-03-12 ENCOUNTER — Encounter: Payer: Self-pay | Admitting: Internal Medicine

## 2018-03-15 ENCOUNTER — Ambulatory Visit (INDEPENDENT_AMBULATORY_CARE_PROVIDER_SITE_OTHER): Payer: BC Managed Care – PPO | Admitting: Pharmacist Clinician (PhC)/ Clinical Pharmacy Specialist

## 2018-03-15 DIAGNOSIS — Z7901 Long term (current) use of anticoagulants: Secondary | ICD-10-CM

## 2018-03-15 DIAGNOSIS — Z952 Presence of prosthetic heart valve: Secondary | ICD-10-CM

## 2018-03-15 LAB — POCT INR: INR: 1.7 — AB (ref 2.0–3.0)

## 2018-03-15 NOTE — Patient Instructions (Signed)
Description   Take enoxaparin injection tonight.  Take warfarin 5 mg today (2.5 tabs) and 4 mg (2 tabs) tomorrow then continue with 3 mg (1.5 tabs) each Monday, Wednesday and Friday and 2 mg (1 tab) all other days.  Repeat INR Friday

## 2018-03-19 ENCOUNTER — Ambulatory Visit: Payer: BC Managed Care – PPO | Admitting: Internal Medicine

## 2018-03-19 ENCOUNTER — Ambulatory Visit (INDEPENDENT_AMBULATORY_CARE_PROVIDER_SITE_OTHER): Payer: BC Managed Care – PPO | Admitting: Pharmacist

## 2018-03-19 ENCOUNTER — Encounter: Payer: Self-pay | Admitting: Internal Medicine

## 2018-03-19 VITALS — BP 118/60 | HR 72 | Ht 63.0 in | Wt 150.4 lb

## 2018-03-19 DIAGNOSIS — Z951 Presence of aortocoronary bypass graft: Secondary | ICD-10-CM

## 2018-03-19 DIAGNOSIS — E7849 Other hyperlipidemia: Secondary | ICD-10-CM | POA: Diagnosis not present

## 2018-03-19 DIAGNOSIS — Z952 Presence of prosthetic heart valve: Secondary | ICD-10-CM

## 2018-03-19 DIAGNOSIS — Z7901 Long term (current) use of anticoagulants: Secondary | ICD-10-CM

## 2018-03-19 LAB — POCT INR: INR: 2.6 (ref 2.0–3.0)

## 2018-03-19 NOTE — Progress Notes (Signed)
Chief Complaint:  Annual follow-up  Primary Care Physician: Mellody Dance, DO  HPI:  Katrina Obrien is a 62 year old female who has a history of an unfortunate episode with left popliteal artery thrombus, PE, severe aortic stenosis which was ultimately diagnosed in a bicuspid aortic valve, as well as multivessel coronary disease, anterior MI, EF about 40% to 45% - all which came to a head about the same time. She underwent 3-vessel bypass as well as mechanical aortic valve replacement. She has done well on Coumadin and was ultimately diagnosed with diabetes and is followed by Dr. Chalmers Cater for this. Now she is markedly improved, although she has an ischemic cardiomyopathy with an EF of 40% to 45%. Her activity level is pretty good.  We recently performed repeat lipid testing, which demonstrated an elevated LDL particle number of 2163. The calculated LDL was 80. I asked her to increase her Crestor to 40 mg at night and add 500 mg Niaspan. She reports significant flushing and intolerance to the Niaspan, despite taking aspirin 30 minutes prior.  Her laboratory work does look improved, with her particle number being reduced to 1547, LDL 55, HDL of 39.  She does report however worsening pain in her capsule when she walks that works its way up her legs. This is nothing as significant as when she was originally diagnosed with her acute arterial thrombus, but has concerns for claudication. Alternatively, this could represent myalgias from her statin.  Katrina Obrien returns today for followup. She is doing extremely well. She continues to be active and has no complaints of chest pain worsening shortness of breath. She does feel like her legs tire easily however this could be improved with more exercise. She denies any symptoms of claudication. Her INR has been therapeutic. She reports good blood sugar control and has an appointment with her endocrinologist in the next couple of weeks. We recently obtained an  echocardiogram which showed normalization of her EF to 55-60% in March of 2015. She's also had lower extremity arterial Dopplers which show preserved ABIs.  I the pleasure seeing Katrina Obrien back in the office today. She reports doing fairly well except occasionally she gets somewhat "swimmy headed". This is worse with change of position. As previously noted her EF has improved back to 55-60%. Recently she's had persistently elevated lipoprotein particles including an LDL particle number in the 1900s. This is despite being on Zetia and Crestor 40 mg. Based on this I recommended that she start on Pralulent. She's been taking this now for just over a month and is having no problems with injection site reactions or side effects. We will plan to check her cholesterol again in about 2 months.  Katrina Obrien was seen in the office today in follow-up. Overall she seems to be doing very well. 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 She will be due for repeat cholesterol test after the first of the year. She denies any chest pain or worsening shortness of breath. She is complaining of some occasional swelling in her lower extremities which improves in the morning. She also gets some pain in her right leg. She is overdue for repeat lower extremity Dopplers and does have a history of course of arterial thrombus in the leg. Warfarin has been therapeutic and managed by the Coumadin clinic. Cholesterol is still not at goal despite max dose therapy. For brief period of time she was on samples of Repatha (PCSK9 inhibitor) with a marked response in cholesterol, however  insurance would not cover this.  12/10/2015  Katrina Obrien returns today for follow-up. She says this is the best she has felt some time. She denies any chest pain or worsening shortness of breath. Recently her cholesterol numbers have risen with LDL now 102. This would put her not at goal therapy. Given her multiple comorbidities. Recent data indicates the fact  that driving LDL below 50 may improve cardiovascular outcomes. This was with the use of maximal statin therapy as well as the addition of a PCSK9 inhibitor, specifically Repatha. In fact, she had responded very well to Pralulent in the past, with LDL as low as 20 however that was not covered by insurance. 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 Aggressive therapy is warranted.  07/15/2016  Katrina Obrien returns today for follow-up. Overall she feels very well. She had one episode of intense burning in her chest which happened a few weeks ago. It was worse when she was laying down at night. She did not take any medicine for that she was concerned she could take it with her other medicines. It sounds like reflux she's not had any more symptoms like that. I told her she could use Tums or an H2 blocker for that as needed. She recently went on to [place] and her LDL cholesterol is now 19. This should be a significant benefit in slowing her overall cardiovascular risks. As mentioned previously she had an echo this past year which showed normal LVEF 55-60% and a normally functioning mechanical aortic valve. Her INR was therapeutic today.  07/09/2017  Katrina Obrien was seen today in follow-up.  She seems to be doing very well.  She reported that she had to shovel snow for several hours with her son and at the end of that did develop some chest tightness/soreness which went away very quickly.  She said she felt like she overworked herself, but generally has had no symptoms.  Recently we repeated a lipid profile which showed marked improvement in her LDL to 28 on statin, ezetimibe and Repatha.  Her triglycerides were elevated at 349, however she mentioned today that this was nonfasting.  Also her blood sugar was elevated recently.  She was started on Jardiance, which I suspect has made a big difference in her blood sugars.  She says  her hemoglobin A1c now is down to 6.1.  Accordingly, her triglycerides have probably improved as well.  Fortunately, her husband was not with her today.  Apparently he is suffering from depression and has had several bouts.  He is not interested in leaving the house and is not as engaged in activities as he has been in the past.  This is obviously causing her a lot of stress and is not clear to her how she can help him.  03/19/2018  Katrina Obrien returns today for follow-up.  She says she is felt today the best she has in years.  Unfortunately, she had a recent colonoscopy which turned out okay however having to come off all of her medications was challenging for her and she was not a fan of bridging Lovenox.  She does report her blood sugars been well controlled.  Her cholesterol is at goal with recent LDL of 58 however it is actually been lower with Repatha.  Her triglycerides are also well controlled at 110.  Her Saint Jude mechanical valve is functioning properly by echo in 2017 which showed normal LVEF.  She  denies any chest pain symptoms with history of three-vessel CABG in 2012.  Blood pressure is at goal today as well.  EKG shows a stable left bundle branch block.  PMHx:  Past Medical History:  Diagnosis Date  . 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 Clotting disorder (HCC)    anticoagulant - coumadin hx DVT, PE  . Coronary artery disease    s/p 3- vessel bypass   . Diabetes mellitus without complication ([place])   . DVT (deep venous thrombosis) ([place])   . Heart murmur   . Hyperlipidemia   . Hypertension   . Ischemic cardiomyopathy    40 to 45%  . Left bundle branch block   . Mechanical heart valve present    Jan 2012  . Popliteal artery occlusion, left (HCC)    recent  status  post angioplasty  . Pulmonary embolism ([place])    associated with DVT  . Pulmonary embolus ([place])   . Thyroid disease     hyperthyroidism    Past Surgical History:  Procedure Laterality Date  . AORTIC VALVE REPLACEMENT (AVR)/CORONARY ARTERY BYPASS GRAFTING (CABG)  08/16/2010  VAN TRIGT   AV replacement w/19-mm mechanical St Jude valve ([id number]),CABGX3  lima to LAD, SVG to distal circ,SVG to posterior descending  . ASD REPAIR    . CARDIAC CATHETERIZATION  08/08/2010   ,LAD 50% PROX,605 TANDEM SEGMENTAL STENOSIS,LEFT CIRC 60%-AV GROOVE, RGT COR 40% PROX ON THE BEND ,80% PROX PDA,  . CORONARY ARTERY BYPASS GRAFT  08/16/2010   LIMA TO lad,svg to distal circ ,svg to posterior descending  . DOPPLER ECHOCARDIOGRAPHY  08/09/2010   EF35% to 40% ,bbicuspid ;severely  thickened ,severely calcified leaflets, LV normal  . LEA doppler  08/07/2010   left popliteal occlusion ,and anterior tibial complete occlusion  . NM MYOCAR PERF WALL MOTION  08/06/2010   EF 31%  LOW RISK SCAN  . nuc    . PV ANGIOGRAM  08/08/2010   successful PTA LEFT POPLITEAL    FAMHx:  Family History  Problem Relation Age of Onset  . Cancer Mother   . Cancer Father   . CAD Father   . Heart attack Father 15       first MI, 2 major after that, multiple silent MIs  . Stroke Brother   . CAD Brother 89       first stent  . Heart attack Brother   . CAD Brother 44       CABG   . Hypertension Brother   . Heart attack Brother 21       died from MI  . Colon cancer Neg Hx   . Esophageal cancer Neg Hx   . Rectal cancer Neg Hx   . Stomach cancer Neg Hx     SOCHx:   reports that she has never smoked. She has never used smokeless tobacco. She reports that she drinks about 7.0 standard drinks of alcohol per week. She reports that she does not use drugs.  ALLERGIES:  Allergies  Allergen Reactions  . Niaspan [Niacin Er] Other (See Comments)    Flushing, even with ASA    ROS: Pertinent items noted in HPI and remainder of comprehensive ROS otherwise negative.  HOME MEDS: Current Outpatient Medications  Medication Sig Dispense Refill  .  aspirin EC 81 MG tablet Take 81 mg by mouth daily.    Marland Kitchen enoxaparin (LOVENOX) 60 MG/0.6ML  injection Inject 0.6 mLs (60 mg total) into the skin every 12 (twelve) hours. 18 Syringe 0  . furosemide (LASIX) 40 MG tablet TAKE 1/2 TABLET BY MOUTH DAILY. 45 tablet 2  . JARDIANCE 25 MG TABS tablet Take 25 mg by mouth daily.  6  . levothyroxine (SYNTHROID, LEVOTHROID) 50 MCG tablet Take 50 mcg by mouth daily before breakfast.    . metFORMIN (GLUMETZA) 500 MG (MOD) 24 hr tablet Take 500 mg by mouth daily.     . metoprolol succinate (TOPROL-XL) 100 MG 24 hr tablet TAKE 1 TABLET BY MOUTH DAILY. TAKE WITH OR IMMEDIATELY FOLLOWING A MEAL. 90 tablet 2  . ramipril (ALTACE) 5 MG capsule Take 5 mg by mouth daily.    Marland Kitchen REPATHA SURECLICK 124 MG/ML SOAJ INJECT 140MG  SUBCUTANEOUSLY EVERY 14 DAYS 6 pen 3  . rosuvastatin (CRESTOR) 40 MG tablet TAKE 1 TABLET BY MOUTH DAILY. 90 tablet 2  . warfarin (COUMADIN) 2 MG tablet TAKE 1 TO 1 AND 1/2 TABLETS BY MOUTH DAILY AS DIRECTED BY COUMADIN CLINIC. 120 tablet 2  . zaleplon (SONATA) 5 MG capsule Take 5 mg by mouth at bedtime as needed.     No current facility-administered medications for this visit.     LABS/IMAGING: No results found for this or any previous visit (from the past 48 hour(s)). No results found.  VITALS: BP 118/60 (BP Location: Left Arm, Patient Position: Sitting, Cuff Size: Normal)   Pulse 72   Ht 5\' 3"  (1.6 m)   Wt 150 lb 6.4 oz (68.2 kg)   LMP  (LMP Unknown)   BMI 26.64 kg/m   EXAM: General appearance: alert and no distress Neck: no adenopathy, no carotid bruit, no JVD, supple, symmetrical, trachea midline and thyroid not enlarged, symmetric, no tenderness/mass/nodules Lungs: clear to auscultation bilaterally Heart: regular rate and rhythm, S1, S2 normal, no murmur, click, rub or gallop and Sharp mechanical valve sound Abdomen: soft, non-tender; bowel sounds normal; no masses,  no organomegaly Extremities: extremities normal, atraumatic, no  cyanosis or edema Pulses: 2+ and symmetric Skin: Skin color, texture, turgor normal. No rashes or lesions Neurologic: Grossly normal  EKG: Normal sinus rhythm 72, possible left atrial enlargement, LBBB-personally reviewed  ASSESSMENT: 1. Coronary artery disease status post 3 vessel CABG (LIMA to LAD, SVG to circumflex, SVG to PDA) - 07/2010 2. Severe aortic stenosis and a bicuspid aortic valve, status post 19 mm St. Jude mechanical aortic valve excellent recent left popliteal artery occlusion with rest pain status post angioplasty (2012) 3. History of pulmonary emboli associated with DVT 4. Ischemic cardiomyopathy, EF now normalized at 55-60% 5. Hypertension - at goal 6. Familial hyperlipidemia - LDL>190 without treatment 7. Diabetes type 2 - controlled 8. LBBB   PLAN: 1.   Mrs. Cowman continues to do well in fact is feeling the best she has in years.  Her EF is normalized and her mechanical aortic valve on exam appears to be functioning normally.  She is due for repeat echo which we will do in 6 months.  She has had therapeutic INR is on warfarin.  She occasionally gets some discomfort in the left leg when walking however that has improved with more exercise.  She denies any lifestyle limiting claudication.  Her diabetes is controlled.  No changes to her medications today.  Follow-up with me in 6 months.  Pixie Casino, MD, [facility], [place] Director of the Advanced Lipid Disorders &  Cardiovascular Risk Reduction  Clinic Attending Cardiologist  Direct Dial: [phone]  Fax: [phone]  Website:  [email or web address] 03/19/2018, 9:00 AM

## 2018-03-19 NOTE — Patient Instructions (Addendum)
Your physician has requested that you have an echocardiogram @ [address] - [address]rd Floor - end of December. Echocardiography is a painless test that uses sound waves to create images of your heart. It provides your doctor with information about the size and shape of your heart and how well your heart's chambers and valves are working. This procedure takes approximately one hour. There are no restrictions for this procedure.  Your physician wants you to follow-up in: 6 months with Dr. Debara Pickett. You will receive a reminder letter in the mail two months in advance. If you don't receive a letter, please call our office to schedule the follow-up appointment.

## 2018-04-12 ENCOUNTER — Ambulatory Visit (INDEPENDENT_AMBULATORY_CARE_PROVIDER_SITE_OTHER): Payer: BC Managed Care – PPO | Admitting: Pharmacist

## 2018-04-12 DIAGNOSIS — Z7901 Long term (current) use of anticoagulants: Secondary | ICD-10-CM | POA: Diagnosis not present

## 2018-04-12 DIAGNOSIS — Z952 Presence of prosthetic heart valve: Secondary | ICD-10-CM | POA: Diagnosis not present

## 2018-04-12 LAB — POCT INR: INR: 2 (ref 2.0–3.0)

## 2018-04-12 NOTE — Patient Instructions (Signed)
Take 2 tablets today (9/16), then increase to 3 mg (1.5 tabs) each Sunday, Monday, Wednesday and Friday and 2 mg (1 tab) all other days.  Repeat INR 3 weeks

## 2018-04-19 ENCOUNTER — Encounter: Payer: Self-pay | Admitting: Adult Health

## 2018-04-19 ENCOUNTER — Ambulatory Visit (INDEPENDENT_AMBULATORY_CARE_PROVIDER_SITE_OTHER): Payer: BC Managed Care – PPO | Admitting: Adult Health

## 2018-04-19 VITALS — BP 138/74 | HR 58 | Temp 97.7°F | Ht 65.0 in | Wt 149.6 lb

## 2018-04-19 DIAGNOSIS — J0100 Acute maxillary sinusitis, unspecified: Secondary | ICD-10-CM | POA: Diagnosis not present

## 2018-04-19 MED ORDER — AMOXICILLIN-POT CLAVULANATE 875-125 MG PO TABS: 1.0000 | ORAL_TABLET | Freq: Two times a day (BID) | ORAL | 0 refills | Status: DC

## 2018-04-19 MED ORDER — FLUTICASONE PROPIONATE 50 MCG/ACT NA SUSP: 2.0000 | Freq: Every day | NASAL | 6 refills | Status: DC

## 2018-04-19 NOTE — Assessment & Plan Note (Signed)
  Please use Augmentin as directed and Flonase as needed. Increase fluids/rest/vit c-2,000mg /day will help boost your immune system when not feeling well. If symptoms persist after antibiotic completed, please call clinic.

## 2018-04-19 NOTE — Patient Instructions (Signed)

## 2018-04-19 NOTE — Progress Notes (Signed)
Subjective:    Patient ID: Katrina Obrien, female    DOB: Nov 18, 1955, 62 y.o.   MRN: [id number]  HPI:  [name] presents with copious thick yellow/green nasal congestion, non productive cough, frontal HA (6/10, constant ache and throb), and L sided facial pressure/pain- that have been present >1.5 weeks. Sx's improved after 3 days then sig worsened. She has been pushing fluids and using OTC Acetaminophen with only minimal sx relief. She is unable take NSAIDs due to lifelong warfarin. She denies CP/dyspnea/fever/night sweats/chills/palpitations/N/V/D   Patient Care Team    Relationship Specialty Notifications Start End  Thomasene Lot, DO PCP - General Family Medicine  02/03/18   Chrystie Nose, MD PCP - Cardiology Cardiology Admissions 03/05/18   Dorisann Frames, MD Attending Physician Endocrinology  01/26/13   Chrystie Nose, MD Consulting Physician Cardiology  02/03/18     Patient Active Problem List   Diagnosis Date Noted  . Acute maxillary sinusitis 04/19/2018  . Hyperlipidemia associated with type 2 diabetes mellitus (HCC) 02/03/2018  . Hypothyroidism 02/03/2018  . LBBB (left bundle branch block) 04/06/2014  . 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 Aortic stenosis 01/26/2013  . S/P AVR 01/26/2013  . 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 DM2 (diabetes mellitus, type 2) (HCC) 01/26/2013  . Hypertension associated with diabetes (HCC) 01/26/2013  . 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 Cardiomyopathy, ischemic 01/26/2013  . DVT (deep venous thrombosis) (HCC) 01/26/2013  . Pulmonary embolus (HCC) 01/26/2013  . Long term (current) use of anticoagulants 10/11/2012     Past Medical History:  Diagnosis Date  . 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 Clotting disorder (HCC)    anticoagulant - coumadin hx DVT, PE  . Coronary artery disease    s/p 3- vessel bypass   . Diabetes  mellitus without complication (HCC)   . DVT (deep venous thrombosis) (HCC)   . Heart murmur   . Hyperlipidemia   . Hypertension   . Ischemic cardiomyopathy    40 to 45%  . Left bundle branch block   . Mechanical heart valve present    Jan 2012  . Popliteal artery occlusion, left (HCC)    recent  status  post angioplasty  . Pulmonary embolism (HCC)    associated with DVT  . Pulmonary embolus (HCC)   . Thyroid disease    hyperthyroidism     Past Surgical History:  Procedure Laterality Date  . AORTIC VALVE REPLACEMENT (AVR)/CORONARY ARTERY BYPASS GRAFTING (CABG)  08/16/2010  VAN TRIGT   AV replacement w/19-mm mechanical St Jude valve ([id number]),CABGX3  lima to LAD, SVG to distal circ,SVG to posterior descending  . ASD REPAIR    . CARDIAC CATHETERIZATION  08/08/2010   ,LAD 50% PROX,605 TANDEM SEGMENTAL STENOSIS,LEFT CIRC 60%-AV GROOVE, RGT COR 40% PROX ON THE BEND ,80% PROX PDA,  . CORONARY ARTERY BYPASS GRAFT  08/16/2010   LIMA TO lad,svg to distal circ ,svg to posterior descending  . DOPPLER ECHOCARDIOGRAPHY  08/09/2010   EF35% to 40% ,bbicuspid ;severely  thickened ,severely calcified leaflets, LV normal  . LEA doppler  08/07/2010   left popliteal occlusion ,and anterior tibial complete occlusion  . NM MYOCAR PERF WALL MOTION  08/06/2010   EF 31%  LOW RISK SCAN  . nuc    . PV ANGIOGRAM  08/08/2010   successful PTA LEFT POPLITEAL     Family History  Problem  Relation Age of Onset  . Cancer Mother   . Cancer Father   . CAD Father   . Heart attack Father 54       first MI, 2 major after that, multiple silent MIs  . Stroke Brother   . CAD Brother 48       first stent  . Heart attack Brother   . CAD Brother 28       CABG   . Hypertension Brother   . Heart attack Brother 45       died from MI  . Colon cancer Neg Hx   . Esophageal cancer Neg Hx   . Rectal cancer Neg Hx   . Stomach cancer Neg Hx      Social History   Substance and Sexual Activity  Drug Use No      Social History   Substance and Sexual Activity  Alcohol Use Yes  . Alcohol/week: 7.0 standard drinks  . Types: 7 Standard drinks or equivalent per week     Social History   Tobacco Use  Smoking Status Never Smoker  Smokeless Tobacco Never Used     Outpatient Encounter Medications as of 04/19/2018  Medication Sig Note  . aspirin EC 81 MG tablet Take 81 mg by mouth daily.   . furosemide (LASIX) 40 MG tablet TAKE 1/2 TABLET BY MOUTH DAILY.   Marland Kitchen JARDIANCE 25 MG TABS tablet Take 25 mg by mouth daily.   Marland Kitchen levothyroxine (SYNTHROID, LEVOTHROID) 50 MCG tablet Take 50 mcg by mouth daily before breakfast.   . metFORMIN (GLUMETZA) 500 MG (MOD) 24 hr tablet Take 500 mg by mouth daily.    . metoprolol succinate (TOPROL-XL) 100 MG 24 hr tablet TAKE 1 TABLET BY MOUTH DAILY. TAKE WITH OR IMMEDIATELY FOLLOWING A MEAL.   . ramipril (ALTACE) 5 MG capsule Take 5 mg by mouth daily.   Marland Kitchen REPATHA SURECLICK 140 MG/ML SOAJ INJECT 140MG  SUBCUTANEOUSLY EVERY 14 DAYS   . rosuvastatin (CRESTOR) 40 MG tablet TAKE 1 TABLET BY MOUTH DAILY.   Marland Kitchen warfarin (COUMADIN) 2 MG tablet TAKE 1 TO 1 AND 1/2 TABLETS BY MOUTH DAILY AS DIRECTED BY COUMADIN CLINIC.   Marland Kitchen zaleplon (SONATA) 5 MG capsule Take 5 mg by mouth at bedtime as needed.   Marland Kitchen amoxicillin-clavulanate (AUGMENTIN) 875-125 MG tablet Take 1 tablet by mouth 2 (two) times daily.   Marland Kitchen enoxaparin (LOVENOX) 60 MG/0.6ML injection Inject 0.6 mLs (60 mg total) into the skin every 12 (twelve) hours. (Patient not taking: Reported on 04/19/2018) 03/09/2018: Am dose only  . fluticasone (FLONASE) 50 MCG/ACT nasal spray Place 2 sprays into both nostrils daily.    No facility-administered encounter medications on file as of 04/19/2018.     Allergies: Niaspan [niacin er]  Body mass index is 24.89 kg/m.  Blood pressure 138/74, pulse (!) 58, temperature 97.7 F (36.5 C), temperature source Oral, height 5\' 5"  (1.651 m), weight 149 lb 9.6 oz (67.9 kg), SpO2 98 %.    Review  of Systems  Constitutional: Positive for activity change and fatigue. Negative for appetite change, chills, diaphoresis, fever and unexpected weight change.  HENT: Positive for congestion, ear pain, postnasal drip, rhinorrhea, sinus pressure, sinus pain, sneezing, sore throat and voice change. Negative for trouble swallowing.   Eyes: Negative for visual disturbance.  Respiratory: Positive for cough. Negative for chest tightness, shortness of breath, wheezing and stridor.   Cardiovascular: Negative for chest pain, palpitations and leg swelling.  Gastrointestinal: Negative for diarrhea, nausea and  vomiting.  Endocrine: Negative for cold intolerance, heat intolerance, polydipsia, polyphagia and polyuria.  Neurological: Positive for headaches. Negative for dizziness.  Hematological: Does not bruise/bleed easily.       Objective:   Physical Exam  Constitutional: She is oriented to person, place, and time. She appears well-developed and well-nourished. No distress.  HENT:  Head: Normocephalic and atraumatic.  Right Ear: External ear normal. Tympanic membrane is not erythematous and not bulging. No decreased hearing is noted.  Left Ear: External ear normal. Tympanic membrane is bulging. Tympanic membrane is not erythematous. No decreased hearing is noted.  Nose: No mucosal edema or rhinorrhea. Right sinus exhibits no maxillary sinus tenderness and no frontal sinus tenderness. Left sinus exhibits maxillary sinus tenderness and frontal sinus tenderness.  Mouth/Throat: Uvula is midline and oropharynx is clear and moist. No posterior oropharyngeal edema or posterior oropharyngeal erythema. Tonsils are 0 on the right. Tonsils are 0 on the left. No tonsillar exudate.  TTP- L frontal/maxiallry   Eyes: Pupils are equal, round, and reactive to light. Conjunctivae and EOM are normal.  Neck: Normal range of motion. Neck supple.  Cardiovascular: Normal rate, regular rhythm, normal heart sounds and intact  distal pulses.  No murmur heard. Pulmonary/Chest: Effort normal and breath sounds normal. No stridor. No respiratory distress. She has no wheezes. She has no rales. She exhibits no tenderness.  Lymphadenopathy:    She has no cervical adenopathy.  Neurological: She is alert and oriented to person, place, and time.  Skin: Skin is warm. Capillary refill takes less than 2 seconds. No rash noted. She is not diaphoretic. No erythema. No pallor.  Psychiatric: She has a normal mood and affect. Her behavior is normal. Judgment and thought content normal.  Nursing note and vitals reviewed.     Assessment & Plan:   1. Acute maxillary sinusitis, recurrence not specified     Acute maxillary sinusitis  Please use Augmentin as directed and Flonase as needed. Increase fluids/rest/vit c-2,000mg /day will help boost your immune system when not feeling well. If symptoms persist after antibiotic completed, please call clinic.    FOLLOW-UP:  Return if symptoms worsen or fail to improve.

## 2018-05-03 ENCOUNTER — Ambulatory Visit (INDEPENDENT_AMBULATORY_CARE_PROVIDER_SITE_OTHER): Payer: BC Managed Care – PPO | Admitting: Pharmacist

## 2018-05-03 DIAGNOSIS — Z7901 Long term (current) use of anticoagulants: Secondary | ICD-10-CM

## 2018-05-03 DIAGNOSIS — Z952 Presence of prosthetic heart valve: Secondary | ICD-10-CM

## 2018-05-03 LAB — POCT INR: INR: 3 (ref 2.0–3.0)

## 2018-05-11 ENCOUNTER — Encounter: Payer: Self-pay | Admitting: Family Medicine

## 2018-05-11 ENCOUNTER — Ambulatory Visit: Payer: BC Managed Care – PPO | Admitting: Family Medicine

## 2018-05-11 VITALS — BP 138/66 | HR 69 | Ht 65.0 in | Wt 153.8 lb

## 2018-05-11 DIAGNOSIS — E039 Hypothyroidism, unspecified: Secondary | ICD-10-CM

## 2018-05-11 DIAGNOSIS — Z0100 Encounter for examination of eyes and vision without abnormal findings: Secondary | ICD-10-CM

## 2018-05-11 DIAGNOSIS — E1159 Type 2 diabetes mellitus with other circulatory complications: Secondary | ICD-10-CM | POA: Diagnosis not present

## 2018-05-11 DIAGNOSIS — E1169 Type 2 diabetes mellitus with other specified complication: Secondary | ICD-10-CM | POA: Diagnosis not present

## 2018-05-11 DIAGNOSIS — I10 Essential (primary) hypertension: Secondary | ICD-10-CM

## 2018-05-11 DIAGNOSIS — E119 Type 2 diabetes mellitus without complications: Secondary | ICD-10-CM

## 2018-05-11 DIAGNOSIS — E785 Hyperlipidemia, unspecified: Secondary | ICD-10-CM

## 2018-05-11 DIAGNOSIS — Z23 Encounter for immunization: Secondary | ICD-10-CM

## 2018-05-11 LAB — POCT GLYCOSYLATED HEMOGLOBIN (HGB A1C): Hemoglobin A1C: 6.5 % — AB (ref 4.0–5.6)

## 2018-05-11 MED ORDER — PNEUMOCOCCAL 13-VAL CONJ VACC IM SUSP
0.5000 mL | INTRAMUSCULAR | Status: AC
  Administered 2018-05-11: 0.5 mL via INTRAMUSCULAR

## 2018-05-11 NOTE — Progress Notes (Signed)
Impression and Recommendations:    1. Type 2 diabetes mellitus with other specified complication, without long-term current use of insulin (Katrina Obrien)   2. Hyperlipidemia associated with type 2 diabetes mellitus (Katrina Obrien)   3. Hypertension associated with diabetes (Katrina Obrien)   4. Hypothyroidism, unspecified type   5. Diabetic eye exam (Katrina Obrien)   6. Need for shingles vaccine   7. Need for pneumococcal vaccination     1. Type 2 diabetes mellitus with other specified complication, without long-term current use of insulin (Katrina Obrien) - A1c today is 6.5, down from 7.0 last check (02/08/2018). - Goal is to keep A1c around 7, below 7.5.  - Blood sugar controlled at this time. - Continue treatment plan as prescribed.  See med list below. - Patient tolerating meds well without complication.  Denies S-E  - Counseled patient on pathophysiology of disease and discussed various treatment options, which often includes dietary and lifestyle modifications as first line.  Importance of low carb/ketogenic diet discussed with patient in addition to regular exercise.   - Continue to check FBS and 2 hours after the biggest meal of your day.  Keep log and bring in next OV for my review.   Also, if you ever feel poorly, please check your blood pressure and blood sugar, as one or the other could be the cause of your symptoms.  - Being a diabetic, you need yearly eye and foot exams. Make appt.for diabetic eye exam.  2. Hyperlipidemia associated with type 2 diabetes mellitus (Katrina Obrien) - Last Lipid Panel 02/08/2018: LDL = 58 HDL = 46 Triglycerides = 110  - Cholesterol controlled at this time. - Continue treatment plan as prescribed.  See med list below. - Patient tolerating meds well without complication.  Denies S-E  - Continue to follow up with cardiology for evaluation.  3. Hypertension associated with diabetes (Katrina Obrien) - Per pt, history of white coat syndrome. - Per pt, stable at home. - Continue treatment plan as  prescribed.  See med list below. - Patient tolerating meds well without complication.  Denies S-E  4. Hypothyroidism, unspecified type - Stable at this time. - Continue treatment plan as prescribed.  See med list below. - Patient tolerating meds well without complication.  Denies S-E  5. BMI Counseling Explained to patient what BMI refers to, and what it means medically.    Told patient to think about it as a "medical risk stratification measurement" and how increasing BMI is associated with increasing risk/ or worsening state of various diseases such as hypertension, hyperlipidemia, diabetes, premature OA, depression etc.  American Heart Association guidelines for healthy diet, basically Mediterranean diet, and exercise guidelines of 30 minutes 5 days per week or more discussed in detail.  Health counseling performed.  All questions answered.  6. Lifestyle & Preventative Health Maintenance - Advised patient to continue working toward exercising to improve overall mental, physical, and emotional health.    - Encouraged patient to continue to engage in daily physical activity, especially a formal exercise routine.  Recommended that the patient eventually strive for at least 150 minutes of moderate cardiovascular activity per week according to guidelines established by the [facility].   - Healthy dietary habits encouraged, including low-carb, and high amounts of lean protein in diet.   - Patient should also consume adequate amounts of water - half of body weight in oz of water per day.   Education and routine counseling performed. Handouts provided.  7. Follow-Up - Prescriptions refilled today. - Re-check  fasting lab work as recommended. - Continue to follow up with specialists as established. - Otherwise, continue to return for CPE and chronic follow-up as scheduled. - Educated patient about immunizations and advised to return as recommended..   - Patient knows to call in sooner if desired  to address acute concerns.   Orders Placed This Encounter  Procedures  . Varicella-zoster vaccine IM (Shingrix)  . Ambulatory referral to Ophthalmology  . POCT glycosylated hemoglobin (Hb A1C)    Medications Discontinued During This Encounter  Medication Reason  . amoxicillin-clavulanate (AUGMENTIN) 875-125 MG tablet Completed Course  . enoxaparin (LOVENOX) 60 MG/0.6ML injection Discontinued by provider  . fluticasone (FLONASE) 50 MCG/ACT nasal spray No longer needed (for PRN medications)  . zaleplon (SONATA) 5 MG capsule Discontinued by provider      Meds ordered this encounter  Medications  . pneumococcal 13-valent conjugate vaccine (PREVNAR 13) injection 0.5 mL    The patient was counseled, risk factors were discussed, anticipatory guidance given.  Gross side effects, risk and benefits, and alternatives of medications discussed with patient.  Patient is aware that all medications have potential side effects and we are unable to predict every side effect or drug-drug interaction that may occur.  Expresses verbal understanding and consents to current therapy plan and treatment regimen.   Return for 3-4 mo- DM, BP etc- bring log of home BP's & BS's.   Please see AVS handed out to patient at the end of our visit for further patient instructions/ counseling done pertaining to today's office visit.    Note:  This document was prepared using Dragon voice recognition software and may include unintentional dictation errors.   This document serves as a record of services personally performed by Mellody Dance, DO. It was created on her behalf by Toni Amend, a trained medical scribe. The creation of this record is based on the scribe's personal observations and the provider's statements to them.   I have reviewed the above medical documentation for accuracy and completeness and I concur.  Mellody Dance 05/11/18 5:18 PM     Subjective:    Chief Complaint  Patient  presents with  . Follow-up    Katrina Obrien is a 62 y.o. female who presents to [place] [facility] today for Diabetes Management.    A1c was formerly in the 8's, never in the 9's.  Dr. Chalmers Cater was always happy around a 7.0 or just above a 7.  DM HPI: -  She has been working on diet and exercise for diabetes.  Patient has been more active since last check, but also eating more since school started up.  Patient states she loves her job.  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  She walks anywhere from 10,000, but usually gets 15,000 steps per day.  Starts every morning with a 30 minute walk, 7 days per week.  Pt is currently maintained on the following medications for diabetes:   see med list today Medication compliance - patient is compliant with her medications, metformin and Jardiance.  Home glucose readings range: Sugars at home have been 130's, around 138 highest. Lowest she's had are 100's. States her fasting blood sugars aren't usually her lowest.   Denies polyuria/polydipsia. Denies hypo/ hyperglycemia symptoms - She denies new onset of: chest pain, exercise intolerance, shortness of breath, dizziness, visual changes, headache, lower extremity swelling or  claudication.   Last diabetic eye exam was No results found for: HMDIABEYEEXA  Foot exam- UTD  Last A1C in the office was:  Lab Results  Component Value Date   HGBA1C 6.5 (A) 05/11/2018   HGBA1C 7.0 (H) 02/08/2018   HGBA1C (H) 08/07/2010    12.1 (NOTE)                                                                       According to the ADA Clinical Practice Recommendations for 2011, when HbA1c is used as a screening test:   >=6.5%   Diagnostic of Diabetes Mellitus           (if abnormal result  is confirmed)  5.7-6.4%   Increased risk of developing Diabetes Mellitus  References:Diagnosis and Classification of Diabetes  Mellitus,Diabetes FAOZ,3086,57(QIONG 1):S62-S69 and Standards of Medical Care in         Diabetes - 2011,Diabetes Care,2011,34  (Suppl 1):S11-S61.    Lab Results  Component Value Date   LDLCALC 58 02/08/2018   CREATININE 0.73 02/08/2018    1. HTN HPI:  -  Her blood pressure has been controlled at home.  Pt is checking it at home. She checks it every other day.  Runs 134/74 regularly at home.  - Patient reports good compliance with blood pressure medications  - Denies medication S-E.   - Smoking Status noted   - She denies new onset of: chest pain, exercise intolerance, shortness of breath, dizziness, visual changes, headache, lower extremity swelling or claudication.   Last 3 blood pressure readings in our office are as follows: BP Readings from Last 3 Encounters:  05/11/18 138/66  04/19/18 138/74  03/19/18 118/60    Filed Weights   05/11/18 1405  Weight: 153 lb 12.8 oz (69.8 kg)     BMI Readings from Last 3 Encounters:  05/11/18 25.59 kg/m  04/19/18 24.89 kg/m  03/19/18 26.64 kg/m     No problems updated.    Patient Care Team    Relationship Specialty Notifications Start End  Mellody Dance, DO PCP - General Family Medicine  02/03/18   Pixie Casino, MD PCP - Cardiology Cardiology Admissions 03/05/18   Jacelyn Pi, MD Attending Physician Endocrinology  01/26/13   Pixie Casino, MD Consulting Physician Cardiology  02/03/18      Patient Active Problem List   Diagnosis Date Noted  . Hyperlipidemia associated with type 2 diabetes mellitus (Katrina Obrien) 02/03/2018    Priority: High  . 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 DM2 (diabetes mellitus, type 2) (Katrina Obrien) 01/26/2013    Priority: High  . Hypertension associated with diabetes (Katrina Obrien) 01/26/2013    Priority: High  . Hypothyroidism 02/03/2018    Priority: Medium  . Femoral popliteal artery thrombus (Katrina Obrien) 01/26/2013    Priority: Medium  . DVT (deep venous thrombosis) (Katrina Obrien) 01/26/2013    Priority:  Medium  . Pulmonary embolus (Katrina Obrien) 01/26/2013    Priority: Medium  . S/P AVR 01/26/2013    Priority: Low  . Long term (current) use of anticoagulants 10/11/2012    Priority: Low  . Acute maxillary sinusitis 04/19/2018  . LBBB (left bundle branch block) 04/06/2014  . Aortic stenosis 01/26/2013  . Familial  hyperlipidemia 01/26/2013  . Cardiomyopathy, ischemic 01/26/2013     Past Medical History:  Diagnosis Date  . 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 Clotting disorder (Katrina Obrien)    anticoagulant - coumadin hx DVT, PE  . Coronary artery disease    s/p 3- vessel bypass   . Diabetes mellitus without complication (Katrina Obrien)   . DVT (deep venous thrombosis) ([place])   . Heart murmur   . Hyperlipidemia   . Hypertension   . Ischemic cardiomyopathy    40 to 45%  . Left bundle branch block   . Mechanical heart valve present    Jan 2012  . Popliteal artery occlusion, left (Katrina Obrien)    recent  status  post angioplasty  . Pulmonary embolism ([place])    associated with DVT  . Pulmonary embolus ([place])   . Thyroid disease    hyperthyroidism     Past Surgical History:  Procedure Laterality Date  . AORTIC VALVE REPLACEMENT (AVR)/CORONARY ARTERY BYPASS GRAFTING (CABG)  08/16/2010  VAN TRIGT   AV replacement w/19-mm mechanical St Jude valve ([id number]),CABGX3  lima to LAD, SVG to distal circ,SVG to posterior descending  . ASD REPAIR    . CARDIAC CATHETERIZATION  08/08/2010   ,LAD 50% PROX,605 TANDEM SEGMENTAL STENOSIS,LEFT CIRC 60%-AV GROOVE, RGT COR 40% PROX ON THE BEND ,80% PROX PDA,  . CORONARY ARTERY BYPASS GRAFT  08/16/2010   LIMA TO lad,svg to distal circ ,svg to posterior descending  . DOPPLER ECHOCARDIOGRAPHY  08/09/2010   EF35% to 40% ,bbicuspid ;severely  thickened ,severely calcified leaflets, LV normal  . LEA doppler  08/07/2010   left popliteal occlusion ,and anterior tibial complete occlusion  . NM  MYOCAR PERF WALL MOTION  08/06/2010   EF 31%  LOW RISK SCAN  . nuc    . PV ANGIOGRAM  08/08/2010   successful PTA LEFT POPLITEAL     Family History  Problem Relation Age of Onset  . Cancer Mother   . Cancer Father   . CAD Father   . Heart attack Father 29       first MI, 2 major after that, multiple silent MIs  . Stroke Brother   . CAD Brother 39       first stent  . Heart attack Brother   . CAD Brother 52       CABG   . Hypertension Brother   . Heart attack Brother 26       died from MI  . Colon cancer Neg Hx   . Esophageal cancer Neg Hx   . Rectal cancer Neg Hx   . Stomach cancer Neg Hx      Social History   Substance and Sexual Activity  Drug Use No  ,  Social History   Substance and Sexual Activity  Alcohol Use Yes  . Alcohol/week: 7.0 standard drinks  . Types: 7 Standard drinks or equivalent per week  ,  Social History   Tobacco Use  Smoking Status Never Smoker  Smokeless Tobacco Never Used  ,    Current Outpatient Medications on File Prior to Visit  Medication Sig Dispense Refill  . aspirin EC 81 MG tablet Take 81 mg by mouth daily.    . furosemide (LASIX) 40 MG tablet TAKE 1/2 TABLET BY MOUTH DAILY. 45 tablet 2  . JARDIANCE 25 MG TABS tablet Take 25 mg by mouth daily.  6  .  levothyroxine (SYNTHROID, LEVOTHROID) 50 MCG tablet Take 50 mcg by mouth daily before breakfast.    . metFORMIN (GLUMETZA) 500 MG (MOD) 24 hr tablet Take 500 mg by mouth daily.     . metoprolol succinate (TOPROL-XL) 100 MG 24 hr tablet TAKE 1 TABLET BY MOUTH DAILY. TAKE WITH OR IMMEDIATELY FOLLOWING A MEAL. 90 tablet 2  . ramipril (ALTACE) 5 MG capsule Take 5 mg by mouth daily.    Marland Kitchen REPATHA SURECLICK 166 MG/ML SOAJ INJECT 140MG  SUBCUTANEOUSLY EVERY 14 DAYS 6 pen 3  . rosuvastatin (CRESTOR) 40 MG tablet TAKE 1 TABLET BY MOUTH DAILY. 90 tablet 2  . warfarin (COUMADIN) 2 MG tablet TAKE 1 TO 1 AND 1/2 TABLETS BY MOUTH DAILY AS DIRECTED BY COUMADIN CLINIC. 120 tablet 2   No current  facility-administered medications on file prior to visit.      Allergies  Allergen Reactions  . Niaspan [Niacin Er] Other (See Comments)    Flushing, even with ASA     Review of Systems:   General:  Denies fever, chills Optho/Auditory:   Denies visual changes, blurred vision Respiratory:   Denies SOB, cough, wheeze, DIB  Cardiovascular:   Denies chest pain, palpitations, painful respirations Gastrointestinal:   Denies nausea, vomiting, diarrhea.  Endocrine:     Denies new hot or cold intolerance Musculoskeletal:  Denies joint swelling, gait issues, or new unexplained myalgias/ arthralgias Skin:  Denies rash, suspicious lesions  Neurological:    Denies dizziness, unexplained weakness, numbness  Psychiatric/Behavioral:   Denies mood changes    Objective:     Blood pressure 138/66, pulse 69, height 5\' 5"  (1.651 m), weight 153 lb 12.8 oz (69.8 kg), SpO2 100 %.  Body mass index is 25.59 kg/m.  General: Well Developed, well nourished, and in no acute distress.  HEENT: Normocephalic, atraumatic, pupils equal round reactive to light, neck supple, No carotid bruits, no JVD Skin: Warm and dry, cap RF less 2 sec Cardiac: Regular rate and rhythm, S1, S2 WNL's, no murmurs rubs or gallops Respiratory: ECTA B/L, Not using accessory muscles, speaking in full sentences. NeuroM-Sk: Ambulates w/o assistance, moves ext * 4 w/o difficulty, sensation grossly intact.  Ext: scant edema b/l lower ext Psych: No HI/SI, judgement and insight good, Euthymic mood. Full Affect.

## 2018-05-11 NOTE — Patient Instructions (Signed)

## 2018-05-22 ENCOUNTER — Other Ambulatory Visit: Payer: Self-pay | Admitting: Internal Medicine

## 2018-05-31 ENCOUNTER — Ambulatory Visit (INDEPENDENT_AMBULATORY_CARE_PROVIDER_SITE_OTHER): Payer: BC Managed Care – PPO | Admitting: Pharmacist Clinician (PhC)/ Clinical Pharmacy Specialist

## 2018-05-31 DIAGNOSIS — Z7901 Long term (current) use of anticoagulants: Secondary | ICD-10-CM | POA: Diagnosis not present

## 2018-05-31 DIAGNOSIS — Z952 Presence of prosthetic heart valve: Secondary | ICD-10-CM | POA: Diagnosis not present

## 2018-05-31 LAB — POCT INR: INR: 3.8 — AB (ref 2.0–3.0)

## 2018-06-28 ENCOUNTER — Ambulatory Visit (INDEPENDENT_AMBULATORY_CARE_PROVIDER_SITE_OTHER): Payer: BC Managed Care – PPO | Admitting: Pharmacist Clinician (PhC)/ Clinical Pharmacy Specialist

## 2018-06-28 DIAGNOSIS — Z7901 Long term (current) use of anticoagulants: Secondary | ICD-10-CM

## 2018-06-28 DIAGNOSIS — Z952 Presence of prosthetic heart valve: Secondary | ICD-10-CM

## 2018-06-28 LAB — POCT INR: INR: 2.7 (ref 2.0–3.0)

## 2018-07-06 LAB — HM DIABETES EYE EXAM

## 2018-07-26 ENCOUNTER — Ambulatory Visit (HOSPITAL_COMMUNITY): Payer: BC Managed Care – PPO | Attending: Cardiology

## 2018-07-26 ENCOUNTER — Other Ambulatory Visit: Payer: Self-pay

## 2018-07-26 DIAGNOSIS — Z952 Presence of prosthetic heart valve: Secondary | ICD-10-CM

## 2018-08-02 ENCOUNTER — Ambulatory Visit (INDEPENDENT_AMBULATORY_CARE_PROVIDER_SITE_OTHER): Payer: BC Managed Care – PPO | Admitting: Pharmacist

## 2018-08-02 DIAGNOSIS — Z7901 Long term (current) use of anticoagulants: Secondary | ICD-10-CM | POA: Diagnosis not present

## 2018-08-02 DIAGNOSIS — Z952 Presence of prosthetic heart valve: Secondary | ICD-10-CM | POA: Diagnosis not present

## 2018-08-02 LAB — POCT INR: INR: 3 (ref 2.0–3.0)

## 2018-08-04 ENCOUNTER — Other Ambulatory Visit: Payer: Self-pay | Admitting: Internal Medicine

## 2018-08-10 ENCOUNTER — Telehealth: Payer: Self-pay | Admitting: Family Medicine

## 2018-08-10 ENCOUNTER — Other Ambulatory Visit: Payer: Self-pay

## 2018-08-10 MED ORDER — JARDIANCE 25 MG PO TABS: 25.0000 mg | ORAL_TABLET | Freq: Every day | ORAL | 1 refills | Status: DC

## 2018-08-10 NOTE — Telephone Encounter (Signed)
Patient called requesting refill on Jardiance, medication was last filled by a pervious provider.  LOV 05/11/2018. Please review and advise. MPulliam, CMA/RT(R)

## 2018-08-10 NOTE — Telephone Encounter (Signed)
Patient is requesting a refill of her Katrina Obrien, she is hoping for a 90 day supply as it is the same price of the 30 day. If approved please send to [facility]

## 2018-08-10 NOTE — Telephone Encounter (Signed)
Medication last filled by pervious provider.  Sent request to Dr. Raliegh Scarlet to review and advise. MPulliam, CMA/RT(R)

## 2018-09-13 ENCOUNTER — Ambulatory Visit: Payer: BC Managed Care – PPO | Admitting: Family Medicine

## 2018-09-13 ENCOUNTER — Encounter: Payer: Self-pay | Admitting: Family Medicine

## 2018-09-13 VITALS — BP 148/75 | HR 66 | Resp 17 | Wt 163.0 lb

## 2018-09-13 DIAGNOSIS — Z951 Presence of aortocoronary bypass graft: Secondary | ICD-10-CM | POA: Diagnosis not present

## 2018-09-13 DIAGNOSIS — I152 Hypertension secondary to endocrine disorders: Secondary | ICD-10-CM

## 2018-09-13 DIAGNOSIS — E1169 Type 2 diabetes mellitus with other specified complication: Secondary | ICD-10-CM | POA: Diagnosis not present

## 2018-09-13 DIAGNOSIS — Z79899 Other long term (current) drug therapy: Secondary | ICD-10-CM

## 2018-09-13 DIAGNOSIS — I10 Essential (primary) hypertension: Secondary | ICD-10-CM | POA: Diagnosis not present

## 2018-09-13 DIAGNOSIS — E1159 Type 2 diabetes mellitus with other circulatory complications: Secondary | ICD-10-CM | POA: Diagnosis not present

## 2018-09-13 DIAGNOSIS — E559 Vitamin D deficiency, unspecified: Secondary | ICD-10-CM

## 2018-09-13 DIAGNOSIS — Z952 Presence of prosthetic heart valve: Secondary | ICD-10-CM

## 2018-09-13 DIAGNOSIS — E785 Hyperlipidemia, unspecified: Secondary | ICD-10-CM

## 2018-09-13 LAB — POCT GLYCOSYLATED HEMOGLOBIN (HGB A1C): HbA1c, POC (controlled diabetic range): 7.2 % — AB (ref 0.0–7.0)

## 2018-09-13 MED ORDER — METFORMIN HCL ER (MOD) 500 MG PO TB24: 500.0000 mg | ORAL_TABLET | Freq: Two times a day (BID) | ORAL | 1 refills | Status: DC

## 2018-09-13 NOTE — Progress Notes (Signed)
Impression and Recommendations:    1. Type 2 diabetes mellitus with other specified complication, without long-term current use of insulin ([place])   2. Hypertension associated with diabetes ([place])   3. Hyperlipidemia associated with type 2 diabetes mellitus ([place])   4. White coat syndrome with diagnosis of hypertension-    blood pressure at goal at home   5. S/P CABG x 3   6. S/P AVR   7. Vitamin D insufficiency   8. High risk medication use     1. Diabetes Mellitus - A1c up to 7.2 from 6.5 prior. - Discussed goal A1c as under 7.0. - A1c elevated at this time due to imprudent lifestyle changes reported by patient.  - Per patient, was taking a lot of metformin in the past, and experienced GI upset. - Patient confirms she is willing to increase dose of metformin from 500 to 1000. - Take one tablet of metformin in the morning, and one tablet at night.  - Otherwise, continue treatment plan as prescribed.  See med list below. - Patient tolerating meds well without complication.  Denies S-E  - Counseled patient extensively on pathophysiology of disease and discussed the importance of prudent dietary and lifestyle modifications as first line treatment.  Importance of low carb/ketogenic diet discussed with patient in addition to regular exercise.   - Goal fasting sugars under 140. - Continue checking FBS and 2 hours after the biggest meal of your day.  Keep log and bring in next OV for my review.   Also, if you ever feel poorly, please check your blood pressure and blood sugar, as one or the other could be the cause of your symptoms.  - Being a diabetic, you need yearly eye and foot exams. Make appt.for diabetic eye exam.  2. Hypertension Associated with Diabetes - with White Coat Syndrome - Elevated on intake, but per patient, stable at home. - Continue treatment plan as prescribed.  See med list below. - Patient tolerating meds well without complication.  Denies S-E  - Ongoing  prudent lifestyle changes such as dash diet and engaging in a regular exercise program discussed with patient.  Educational handouts provided  - Ongoing ambulatory BP monitoring encouraged. Keep log and bring in next OV.  3. Blocked Sebaceous Cyst - Advised patient to use warm compress ([place]) on the area, as many times per day as possible. - Do not squeeze or palpate the area. - Keep the area open to air as much as possible at home. - Avoid touching the area.  - Will continue to monitor.  4. S/P CABG x 3, S/P AVR, Followed by Cardiology - Patient knows to continue follow-up as prescribed by cardiology. - Treatment plan will continue to be managed by cardiology. - Will continue to monitor.  5. BMI Counseling - BMI of 27.12 Explained to patient what BMI refers to, and what it means medically.    Told patient to think about it as a "medical risk stratification measurement" and how increasing BMI is associated with increasing risk/ or worsening state of various diseases such as hypertension, hyperlipidemia, diabetes, premature OA, depression etc.  American Heart Association guidelines for healthy diet, basically Mediterranean diet, and exercise guidelines of 30 minutes 5 days per week or more discussed in detail.  Health counseling performed.  All questions answered.  6. Lifestyle & Preventative Health Maintenance - Advised patient to continue working toward exercising to improve overall mental, physical, and emotional health.    -  Encouraged patient to engage in daily physical activity, especially a formal exercise routine.  Recommended that the patient eventually strive for at least 150 minutes of moderate cardiovascular activity per week according to guidelines established by the [facility].   - Healthy dietary habits encouraged, including low-carb, and high amounts of lean protein in diet.   - Patient should also consume adequate amounts of water.   Education and routine counseling  performed. Handouts provided.    Orders Placed This Encounter  Procedures  . VITAMIN D 25 Hydroxy (Vit-D Deficiency, Fractures)  . Comprehensive metabolic panel  . POCT HgB A1C    Meds ordered this encounter  Medications  . metFORMIN (GLUMETZA) 500 MG (MOD) 24 hr tablet    Sig: Take 1 tablet (500 mg total) by mouth 2 (two) times daily with a meal.    Dispense:  180 tablet    Refill:  1    Medications Discontinued During This Encounter  Medication Reason  . metFORMIN (GLUMETZA) 500 MG (MOD) 24 hr tablet Reorder     The patient was counseled, risk factors were discussed, anticipatory guidance given.  Gross side effects, risk and benefits, and alternatives of medications discussed with patient.  Patient is aware that all medications have potential side effects and we are unable to predict every side effect or drug-drug interaction that may occur.  Expresses verbal understanding and consents to current therapy plan and treatment regimen.  Return for bldwrk later this week; f/up 58mo for DM and MMP .  Please see AVS handed out to patient at the end of our visit for further patient instructions/ counseling done pertaining to today's office visit.    Note:  This document was prepared using Dragon voice recognition software and may include unintentional dictation errors.   This document serves as a record of services personally performed by Mellody Dance, DO. It was created on her behalf by Toni Amend, a trained medical scribe. The creation of this record is based on the scribe's personal observations and the provider's statements to them.   I have reviewed the above medical documentation for accuracy and completeness and I concur.  Mellody Dance, DO 09/14/2018 1:12 PM     Subjective:    HPI: THEIA DEZEEUW is a 63 y.o. female who presents to [place] [facility] today for follow up of [place].    Recent Acute Stress - Death in the  Family (father-in-law) Father in law got sick recently and died close to Christmas.  Patient notes she sat in the hospital for 3 weeks and ate everything in sight.  She gained 10 lbs and notes "it's taking me forever to get back in the swing, to get back into walking and doing the exercise I need to do."  Knot under Left Armpit - Suspected Cyst Started like a pimple in size.  DM HPI:  - Due to her recent acute stress, she has not been working on diet and exercise for diabetes.  Pt is currently maintained on the following medications for diabetes: See med list today  Medication compliance - Continues on treatment plan as recommended.  Home glucose readings range: has been taking them in the morning, and has had some high readings over the past few months, around 177.   Denies polyuria/polydipsia.  Denies hypo/ hyperglycemia symptoms   Last diabetic eye exam was  Lab Results  Component Value Date   HMDIABEYEEXA No Retinopathy 07/06/2018    Foot exam- UTD  Last A1C in the office was:  Lab Results  Component Value Date   HGBA1C 7.2 (A) 09/13/2018   HGBA1C 6.5 (A) 05/11/2018   HGBA1C 7.0 (H) 02/08/2018    Lab Results  Component Value Date   LDLCALC 58 02/08/2018   CREATININE 0.73 02/08/2018    Wt Readings from Last 3 Encounters:  09/13/18 163 lb (73.9 kg)  05/11/18 153 lb 12.8 oz (69.8 kg)  04/19/18 149 lb 9.6 oz (67.9 kg)    HTN:  -  Her blood pressure has been controlled at home.  Pt has been checking it regularly.  128/68 commonly, 133/74 after work.  Patient checks her blood pressure once weekly.  133/74 is more "normal" because she usually doesn't sit and wait for it to lower.  - Patient reports good compliance with blood pressure medications  - Denies medication S-E   - Smoking Status noted   - She denies new onset of: chest pain, exercise intolerance, shortness of breath, dizziness, visual changes, headache, lower extremity swelling or claudication.    Last 3 blood pressure readings in our office are as follows: BP Readings from Last 3 Encounters:  09/13/18 (!) 148/75  05/11/18 138/66  04/19/18 138/74    Pulse Readings from Last 3 Encounters:  09/13/18 66  05/11/18 69  04/19/18 (!) 58    Filed Weights   09/13/18 1519  Weight: 163 lb (73.9 kg)      Patient Care Team    Relationship Specialty Notifications Start End  Mellody Dance, DO PCP - General Family Medicine  02/03/18   Pixie Casino, MD PCP - Cardiology Cardiology Admissions 03/05/18   Jacelyn Pi, MD Attending Physician Endocrinology  01/26/13   Pixie Casino, MD Consulting Physician Cardiology  02/03/18      Lab Results  Component Value Date   CREATININE 0.73 02/08/2018   BUN 19 02/08/2018   NA 140 02/08/2018   K 3.9 02/08/2018   CL 99 02/08/2018   CO2 22 02/08/2018    Lab Results  Component Value Date   CHOL 126 02/08/2018   CHOL 135 01/27/2018   CHOL 189 08/25/2017    Lab Results  Component Value Date   HDL 46 02/08/2018   HDL 44 01/27/2018   HDL 47 08/25/2017    Lab Results  Component Value Date   LDLCALC 58 02/08/2018   LDLCALC 64 01/27/2018   LDLCALC 107 (H) 08/25/2017    Lab Results  Component Value Date   TRIG 110 02/08/2018   TRIG 137 01/27/2018   TRIG 174 (H) 08/25/2017    Lab Results  Component Value Date   CHOLHDL 2.7 02/08/2018   CHOLHDL 3.1 01/27/2018   CHOLHDL 4.0 08/25/2017    No results found for: LDLDIRECT ===================================================================   Patient Active Problem List   Diagnosis Date Noted  . Hyperlipidemia associated with type 2 diabetes mellitus ([place]) 02/03/2018    Priority: High  . 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 DM2 (diabetes mellitus, type 2) ([place]) 01/26/2013    Priority: High  . Hypertension associated with diabetes ([place]) 01/26/2013    Priority: High  . Hypothyroidism 02/03/2018    Priority: Medium  . Femoral popliteal artery thrombus  ([place]) 01/26/2013    Priority: Medium  . DVT (deep venous thrombosis) ([place]) 01/26/2013    Priority: Medium  . Pulmonary embolus ([place]) 01/26/2013    Priority: Medium  . S/P AVR 01/26/2013    Priority: Low  . Long  term (current) use of anticoagulants 10/11/2012    Priority: Low  . Vitamin D insufficiency 09/13/2018  . White coat syndrome with diagnosis of hypertension 09/13/2018  . Acute maxillary sinusitis 04/19/2018  . LBBB (left bundle branch block) 04/06/2014  . Aortic stenosis 01/26/2013  . 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 Cardiomyopathy, ischemic 01/26/2013     Past Medical History:  Diagnosis Date  . 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 Clotting disorder (H[facility])    anticoagulant - coumadin hx DVT, PE  . Coronary artery disease    s/p 3- vessel bypass   . Diabetes mellitus without complication ([place])   . DVT (deep venous thrombosis) ([place])   . Heart murmur   . Hyperlipidemia   . Hypertension   . Ischemic cardiomyopathy    40 to 45%  . Left bundle branch block   . Mechanical heart valve present    Jan 2012  . Popliteal artery occlusion, left (HCC)    recent  status  post angioplasty  . Pulmonary embolism ([place])    associated with DVT  . Pulmonary embolus ([place])   . Thyroid disease    hyperthyroidism     Past Surgical History:  Procedure Laterality Date  . AORTIC VALVE REPLACEMENT (AVR)/CORONARY ARTERY BYPASS GRAFTING (CABG)  08/16/2010  VAN TRIGT   AV replacement w/19-mm mechanical St Jude valve ([id number]),CABGX3  lima to LAD, SVG to distal circ,SVG to posterior descending  . ASD REPAIR    . CARDIAC CATHETERIZATION  08/08/2010   ,LAD 50% PROX,605 TANDEM SEGMENTAL STENOSIS,LEFT CIRC 60%-AV GROOVE, RGT COR 40% PROX ON THE BEND ,80% PROX PDA,  . CORONARY ARTERY BYPASS GRAFT  08/16/2010   LIMA TO lad,svg to distal circ ,svg to posterior descending  . DOPPLER ECHOCARDIOGRAPHY   08/09/2010   EF35% to 40% ,bbicuspid ;severely  thickened ,severely calcified leaflets, LV normal  . LEA doppler  08/07/2010   left popliteal occlusion ,and anterior tibial complete occlusion  . NM MYOCAR PERF WALL MOTION  08/06/2010   EF 31%  LOW RISK SCAN  . nuc    . PV ANGIOGRAM  08/08/2010   successful PTA LEFT POPLITEAL     Family History  Problem Relation Age of Onset  . Cancer Mother   . Cancer Father   . CAD Father   . Heart attack Father 55       first MI, 2 major after that, multiple silent MIs  . Stroke Brother   . CAD Brother 14       first stent  . Heart attack Brother   . CAD Brother 86       CABG   . Hypertension Brother   . Heart attack Brother 7       died from MI  . Colon cancer Neg Hx   . Esophageal cancer Neg Hx   . Rectal cancer Neg Hx   . Stomach cancer Neg Hx      Social History   Substance and Sexual Activity  Drug Use No  ,  Social History   Substance and Sexual Activity  Alcohol Use Yes  . Alcohol/week: 7.0 standard drinks  . Types: 7 Standard drinks or equivalent per week  ,  Social History   Tobacco Use  Smoking Status Never Smoker  Smokeless Tobacco Never Used  ,    Current Outpatient Medications on File Prior to Visit  Medication Sig Dispense  Refill  . aspirin EC 81 MG tablet Take 81 mg by mouth daily.    . furosemide (LASIX) 40 MG tablet TAKE 1/2 TABLET BY MOUTH DAILY. 45 tablet 2  . JARDIANCE 25 MG TABS tablet Take 25 mg by mouth daily. 90 tablet 1  . levothyroxine (SYNTHROID, LEVOTHROID) 50 MCG tablet Take 50 mcg by mouth daily before breakfast.    . metoprolol succinate (TOPROL-XL) 100 MG 24 hr tablet TAKE 1 TABLET BY MOUTH DAILY. TAKE WITH OR IMMEDIATELY FOLLOWING A MEAL. 90 tablet 2  . ramipril (ALTACE) 5 MG capsule Take 5 mg by mouth daily.    Marland Kitchen REPATHA SURECLICK 875 MG/ML SOAJ INJECT 140MG  SUBCUTANEOUSLY EVERY 14 DAYS 6 pen 3  . rosuvastatin (CRESTOR) 40 MG tablet TAKE 1 TABLET BY MOUTH DAILY. 90 tablet 2  .  warfarin (COUMADIN) 2 MG tablet TAKE 1 TO 1 AND 1/2 TABLETS BY MOUTH DAILY AS DIRECTED BY COUMADIN CLINIC. 120 tablet 2   No current facility-administered medications on file prior to visit.      Allergies  Allergen Reactions  . Niaspan [Niacin Er] Other (See Comments)    Flushing, even with ASA     Review of Systems:   General:  Denies fever, chills Optho/Auditory:   Denies visual changes, blurred vision Respiratory:   Denies SOB, cough, wheeze, DIB  Cardiovascular:   Denies chest pain, palpitations, painful respirations Gastrointestinal:   Denies nausea, vomiting, diarrhea.  Endocrine:     Denies new hot or cold intolerance Musculoskeletal:  Denies joint swelling, gait issues, or new unexplained myalgias/ arthralgias Skin:  Denies rash, suspicious lesions  Neurological:    Denies dizziness, unexplained weakness, numbness  Psychiatric/Behavioral:   Denies mood changes  Objective:    Blood pressure (!) 148/75, pulse 66, resp. rate 17, weight 163 lb (73.9 kg), SpO2 100 %.  Body mass index is 27.12 kg/m.  General: Well Developed, well nourished, and in no acute distress.  HEENT: Normocephalic, atraumatic, pupils equal round reactive to light, neck supple, No carotid bruits, no JVD Skin: Warm and dry, cap RF less 2 sec Cardiac: Status post AVR.  Regular rate and rhythm, S1, S2 WNL's, no murmurs rubs or gallops Respiratory: ECTA B/L, Not using accessory muscles, speaking in full sentences. NeuroM-Sk: Ambulates w/o assistance, moves ext * 4 w/o difficulty, sensation grossly intact.  Ext: scant edema b/l lower ext Psych: No HI/SI, judgement and insight good, Euthymic mood. Full Affect. Left Axilla:  3-4 mm diameter hardened nodule in left axilla.  Upon palpation, expressed white thickened exudate and proceeded to express as much as possible.

## 2018-09-13 NOTE — Patient Instructions (Addendum)
Increase your metformin to twice daily.  Also, please get back to your walking regiment as this is excellent for stress management.  -Come in 1 day earlier this week for blood work.  It does not have to be fasting.       Please realize, EXERCISE IS MEDICINE!  -  American Heart Association [facility]) guidelines for exercise : If you are in good health, without any medical conditions, you should engage in 150-300 minutes of moderate intensity aerobic activity per week.  This means you should be huffing and puffing throughout your workout.   Engaging in regular exercise will improve brain function and memory, as well as improve mood, boost immune system and help with weight management.  As well as the other, more well-known effects of exercise such as decreasing blood sugar levels, decreasing blood pressure,  and decreasing bad cholesterol levels/ increasing good cholesterol levels.     -  The AHA strongly endorses consumption of a diet that contains a variety of foods from all the food categories with an emphasis on fruits and vegetables; fat-free and low-fat dairy products; cereal and grain products; legumes and nuts; and fish, poultry, and/or extra lean meats.    Excessive food intake, especially of foods high in saturated and trans fats, sugar, and salt, should be avoided.    Adequate water intake of roughly 1/2 of your weight in pounds, should equal the ounces of water per day you should drink.  So for instance, if you're 200 pounds, that would be 100 ounces of water per day.         Mediterranean Diet  Why follow it? Research shows. . Those who follow the Mediterranean diet have a reduced risk of heart disease  . The diet is associated with a reduced incidence of Parkinson's and Alzheimer's diseases . People following the diet may have longer life expectancies and lower rates of chronic diseases  . The Dietary Guidelines for Americans recommends the Mediterranean diet as an eating plan to  promote health and prevent disease  What Is the Mediterranean Diet?  . Healthy eating plan based on typical foods and recipes of Mediterranean-style cooking . The diet is primarily a plant based diet; these foods should make up a majority of meals   Starches - Plant based foods should make up a majority of meals - They are an important sources of vitamins, minerals, energy, antioxidants, and fiber - Choose whole grains, foods high in fiber and minimally processed items  - Typical grain sources include wheat, oats, barley, corn, brown rice, bulgar, farro, millet, polenta, couscous  - Various types of beans include chickpeas, lentils, fava beans, black beans, white beans   Fruits  Veggies - Large quantities of antioxidant rich fruits & veggies; 6 or more servings  - Vegetables can be eaten raw or lightly drizzled with oil and cooked  - Vegetables common to the traditional Mediterranean Diet include: artichokes, arugula, beets, broccoli, brussel sprouts, cabbage, carrots, celery, collard greens, cucumbers, eggplant, kale, leeks, lemons, lettuce, mushrooms, okra, onions, peas, peppers, potatoes, pumpkin, radishes, rutabaga, shallots, spinach, sweet potatoes, turnips, zucchini - Fruits common to the Mediterranean Diet include: apples, apricots, avocados, cherries, clementines, dates, figs, grapefruits, grapes, melons, nectarines, oranges, peaches, pears, pomegranates, strawberries, tangerines  Fats - Replace butter and margarine with healthy oils, such as olive oil, canola oil, and tahini  - Limit nuts to no more than a handful a day  - Nuts include walnuts, almonds, pecans, pistachios, pine nuts  -  Limit or avoid candied, honey roasted or heavily salted nuts - Olives are central to the Mediterranean diet - can be eaten whole or used in a variety of dishes   Meats Protein - Limiting red meat: no more than a few times a month - When eating red meat: choose lean cuts and keep the portion to the size of  deck of cards - Eggs: approx. 0 to 4 times a week  - Fish and lean poultry: at least 2 a week  - Healthy protein sources include, chicken, [place], lean beef, lamb - Increase intake of seafood such as tuna, salmon, trout, mackerel, shrimp, scallops - Avoid or limit high fat processed meats such as sausage and bacon  Dairy - Include moderate amounts of low fat dairy products  - Focus on healthy dairy such as fat free yogurt, skim milk, low or reduced fat cheese - Limit dairy products higher in fat such as whole or 2% milk, cheese, ice cream  Alcohol - Moderate amounts of red wine is ok  - No more than 5 oz daily for women (all ages) and men older than age 7  - No more than 10 oz of wine daily for men younger than 19  Other - Limit sweets and other desserts  - Use herbs and spices instead of salt to flavor foods  - Herbs and spices common to the traditional Mediterranean Diet include: basil, bay leaves, chives, cloves, cumin, fennel, garlic, lavender, marjoram, mint, oregano, parsley, pepper, rosemary, sage, savory, sumac, tarragon, thyme   It's not just a diet, it's a lifestyle:  . The Mediterranean diet includes lifestyle factors typical of those in the region  . Foods, drinks and meals are best eaten with others and savored . Daily physical activity is important for overall good health . This could be strenuous exercise like running and aerobics . This could also be more leisurely activities such as walking, housework, yard-work, or taking the stairs . Moderation is the key; a balanced and healthy diet accommodates most foods and drinks . Consider portion sizes and frequency of consumption of certain foods   Meal Ideas & Options:  . Breakfast:  o Whole wheat toast or whole wheat English muffins with peanut butter & hard boiled egg o Steel cut oats topped with apples & cinnamon and skim milk  o Fresh fruit: banana, strawberries, melon, berries, peaches  o Smoothies: strawberries,  bananas, greek yogurt, peanut butter o Low fat greek yogurt with blueberries and granola  o Egg white omelet with spinach and mushrooms o Breakfast couscous: whole wheat couscous, apricots, skim milk, cranberries  . Sandwiches:  o Hummus and grilled vegetables (peppers, zucchini, squash) on whole wheat bread   o Grilled chicken on whole wheat pita with lettuce, tomatoes, cucumbers or tzatziki  o Tuna salad on whole wheat bread: tuna salad made with greek yogurt, olives, red peppers, capers, green onions o Garlic rosemary lamb pita: lamb sauted with garlic, rosemary, salt & pepper; add lettuce, cucumber, greek yogurt to pita - flavor with lemon juice and black pepper  . Seafood:  o Mediterranean grilled salmon, seasoned with garlic, basil, parsley, lemon juice and black pepper o Shrimp, lemon, and spinach whole-grain pasta salad made with low fat greek yogurt  o Seared scallops with lemon orzo  o Seared tuna steaks seasoned salt, pepper, coriander topped with tomato mixture of olives, tomatoes, olive oil, minced garlic, parsley, green onions and cappers  . Meats:  o Herbed greek chicken salad  with kalamata olives, cucumber, feta  o Red bell peppers stuffed with spinach, bulgur, lean ground beef (or lentils) & topped with feta   o Kebabs: skewers of chicken, tomatoes, onions, zucchini, squash  o [place] burgers: made with red onions, mint, dill, lemon juice, feta cheese topped with roasted red peppers . Vegetarian o Cucumber salad: cucumbers, artichoke hearts, celery, red onion, feta cheese, tossed in olive oil & lemon juice  o Hummus and whole grain pita points with a greek salad (lettuce, tomato, feta, olives, cucumbers, red onion) o Lentil soup with celery, carrots made with vegetable broth, garlic, salt and pepper  o Tabouli salad: parsley, bulgur, mint, scallions, cucumbers, tomato, radishes, lemon juice, olive oil, salt and pepper.     How to Increase Your Level of Physical  Activity  Getting regular physical activity is important for your overall health and well-being. Most people do not get enough exercise. There are easy ways to increase your level of physical activity, even if you have not been very active in the past or you are just starting out. Why is physical activity important? Physical activity has many short-term and long-term health benefits. Regular exercise can:  Help you lose weight or maintain a healthy weight.  Strengthen your muscles and bones.  Boost your mood and improve self-esteem.  Reduce your risk of certain long-term (chronic) diseases, like heart disease, cancer, and diabetes.  Help you stay capable of walking and moving around (mobile) as you age.  Prevent accidents, such as falls, as you age.  Increase life expectancy.  What are the benefits of being physically active on a regular basis? In addition to improving your physical health, being physically active on most days of the week can help you in ways that you may not expect. Benefits of regular physical activity may include:  Feeling good about your body.  Being able to move around more easily and for longer periods of time without getting tired (increased stamina).  Finding new sources of fun and enjoyment.  Meeting new people who share a common interest.  Being able to fight off illness better (enhanced immunity).  Being able to sleep better.  What can happen if I am not physically active on a regular basis? Not getting enough physical activity can lead to an unhealthy lifestyle and future health problems. This can increase your chances of:  Becoming overweight or obese.  Becoming sick.  Developing chronic illnesses, like heart disease or diabetes.  Having mental health problems, like depression or anxiety.  Having sleep problems.  Having trouble walking or getting yourself around (reduced mobility).  Injuring yourself in a fall as you get older.  What  steps can I take to be more physically active?  Check with your health care provider about how to get started. Ask your health care provider what activities are safe for you.  Start out slowly. Walking or doing some simple chair exercises is a good place to start, especially if you have not been active before or for a long time.  Try to find activities that you enjoy. You are more likely to commit to an exercise routine if it does not feel like a chore.  If you have bone or joint problems, choose low-impact exercises, like walking or swimming.  Include physical activity in your everyday routine.  Invite friends or family members to exercise with you. This also will help you commit to your workout plan.  Set goals that you can work toward.  Aim  for at least 150 minutes of moderate-intensity exercise each week. Examples of moderate-intensity exercise include walking or riding a bike. Where to find more information:  Centers for Disease Control and Prevention: [employer]lingG[employer].is  [employer] on [employer] Nutrition www.http://villegas.org/  ChooseMyPlate: [employer].dk Contact a health care provider if:  You have headaches, muscle aches, or joint pain.  You feel dizzy or light-headed while exercising.  You faint.  You have chest pain while exercising. Summary  Exercise benefits your mind and body at any age, even if you are just starting out.  If you have a chronic illness or have not been active for a while, check with your health care provider before increasing your physical activity.  Choose activities that are safe and enjoyable for you.Ask your health care provider what activities are safe for you.  Start slowly. Tell your health care provider if you have problems as you start to increase your activity level. This information is not intended to replace advice given to you by your health care provider.  Make sure you discuss any questions you have with your health care provider. Document Released: 07/03/2016 Document Revised: 07/03/2016 Document Reviewed: 07/03/2016 Elsevier Interactive Patient Education  [employer].

## 2018-09-15 ENCOUNTER — Other Ambulatory Visit: Payer: BC Managed Care – PPO

## 2018-09-15 DIAGNOSIS — E1169 Type 2 diabetes mellitus with other specified complication: Secondary | ICD-10-CM

## 2018-09-15 DIAGNOSIS — I10 Essential (primary) hypertension: Secondary | ICD-10-CM

## 2018-09-15 DIAGNOSIS — Z79899 Other long term (current) drug therapy: Secondary | ICD-10-CM

## 2018-09-15 DIAGNOSIS — E559 Vitamin D deficiency, unspecified: Secondary | ICD-10-CM

## 2018-09-15 DIAGNOSIS — E1159 Type 2 diabetes mellitus with other circulatory complications: Secondary | ICD-10-CM

## 2018-09-15 NOTE — Addendum Note (Signed)
Addended by: Lanier Prude D on: 09/15/2018 02:12 PM   Modules accepted: Orders

## 2018-09-16 ENCOUNTER — Ambulatory Visit: Payer: BC Managed Care – PPO | Admitting: Internal Medicine

## 2018-09-16 ENCOUNTER — Encounter: Payer: Self-pay | Admitting: Internal Medicine

## 2018-09-16 ENCOUNTER — Ambulatory Visit (INDEPENDENT_AMBULATORY_CARE_PROVIDER_SITE_OTHER): Payer: BC Managed Care – PPO | Admitting: Pharmacist Clinician (PhC)/ Clinical Pharmacy Specialist

## 2018-09-16 VITALS — BP 162/74 | HR 80 | Ht 63.0 in | Wt 159.8 lb

## 2018-09-16 DIAGNOSIS — Z7901 Long term (current) use of anticoagulants: Secondary | ICD-10-CM | POA: Diagnosis not present

## 2018-09-16 DIAGNOSIS — Z951 Presence of aortocoronary bypass graft: Secondary | ICD-10-CM

## 2018-09-16 DIAGNOSIS — Z952 Presence of prosthetic heart valve: Secondary | ICD-10-CM

## 2018-09-16 DIAGNOSIS — E7849 Other hyperlipidemia: Secondary | ICD-10-CM | POA: Diagnosis not present

## 2018-09-16 DIAGNOSIS — I447 Left bundle-branch block, unspecified: Secondary | ICD-10-CM | POA: Diagnosis not present

## 2018-09-16 LAB — COMPREHENSIVE METABOLIC PANEL
ALT: 28 IU/L (ref 0–32)
AST: 19 IU/L (ref 0–40)
Albumin/Globulin Ratio: 2.1 (ref 1.2–2.2)
Albumin: 4.7 g/dL (ref 3.8–4.8)
Alkaline Phosphatase: 63 IU/L (ref 39–117)
BUN/Creatinine Ratio: 42 — ABNORMAL HIGH (ref 12–28)
BUN: 35 mg/dL — ABNORMAL HIGH (ref 8–27)
Bilirubin Total: 0.3 mg/dL (ref 0.0–1.2)
CO2: 20 mmol/L (ref 20–29)
Calcium: 9.9 mg/dL (ref 8.7–10.3)
Chloride: 102 mmol/L (ref 96–106)
Creatinine, Ser: 0.84 mg/dL (ref 0.57–1.00)
GFR calc non Af Amer: 75 mL/min/{1.73_m2} (ref >59)
GFR, EST AFRICAN AMERICAN: 87 mL/min/{1.73_m2} (ref >59)
GLOBULIN, TOTAL: 2.2 g/dL (ref 1.5–4.5)
Glucose: 131 mg/dL — ABNORMAL HIGH (ref 65–99)
Potassium: 4.1 mmol/L (ref 3.5–5.2)
Sodium: 140 mmol/L (ref 134–144)
Total Protein: 6.9 g/dL (ref 6.0–8.5)

## 2018-09-16 LAB — VITAMIN D 25 HYDROXY (VIT D DEFICIENCY, FRACTURES): Vit D, 25-Hydroxy: 42.9 ng/mL (ref 30.0–100.0)

## 2018-09-16 LAB — POCT INR: INR: 3.5 — AB (ref 2.0–3.0)

## 2018-09-16 NOTE — Patient Instructions (Signed)
Medication Instructions:  NO CHANGE If you need a refill on your cardiac medications before your next appointment, please call your pharmacy.   Lab work: Your physician recommends that you return for lab work in: [place] If you have labs (blood work) drawn today and your tests are completely normal, you will receive your results only by: Marland Kitchen MyChart Message (if you have MyChart) OR . A paper copy in the mail If you have any lab test that is abnormal or we need to change your treatment, we will call you to review the results.  Follow-Up: At [facility], you and your health needs are our priority.  As part of our continuing mission to provide you with exceptional heart care, we have created designated Provider Care Teams.  These Care Teams include your primary Cardiologist (physician) and Advanced Practice Providers (APPs -  Physician Assistants and Nurse Practitioners) who all work together to provide you with the care you need, when you need it. You will need a follow up appointment in 6 months.  Please call our office 2 months in advance to schedule this appointment.  You may see Pixie Casino, MD or one of the following Advanced Practice Providers on your designated Care Team: [place], [place] . Fabian Sharp, PA-C

## 2018-09-16 NOTE — Progress Notes (Signed)
Chief Complaint:  Routine follow-up  Primary Care Physician: Mellody Dance, DO  HPI:  Katrina Obrien is a 63 year old female who has a history of an unfortunate episode with left popliteal artery thrombus, PE, severe aortic stenosis which was ultimately diagnosed in a bicuspid aortic valve, as well as multivessel coronary disease, anterior MI, EF about 40% to 45% - all which came to a head about the same time. She underwent 3-vessel bypass as well as mechanical aortic valve replacement. She has done well on Coumadin and was ultimately diagnosed with diabetes and is followed by Dr. Chalmers Cater for this. Now she is markedly improved, although she has an ischemic cardiomyopathy with an EF of 40% to 45%. Her activity level is pretty good.  We recently performed repeat lipid testing, which demonstrated an elevated LDL particle number of 2163. The calculated LDL was 80. I asked her to increase her Crestor to 40 mg at night and add 500 mg Niaspan. She reports significant flushing and intolerance to the Niaspan, despite taking aspirin 30 minutes prior.  Her laboratory work does look improved, with her particle number being reduced to 1547, LDL 55, HDL of 39.  She does report however worsening pain in her capsule when she walks that works its way up her legs. This is nothing as significant as when she was originally diagnosed with her acute arterial thrombus, but has concerns for claudication. Alternatively, this could represent myalgias from her statin.  Katrina Obrien returns today for followup. She is doing extremely well. She continues to be active and has no complaints of chest pain worsening shortness of breath. She does feel like her legs tire easily however this could be improved with more exercise. She denies any symptoms of claudication. Her INR has been therapeutic. She reports good blood sugar control and has an appointment with her endocrinologist in the next couple of weeks. We recently obtained an  echocardiogram which showed normalization of her EF to 55-60% in March of 2015. She's also had lower extremity arterial Dopplers which show preserved ABIs.  I the pleasure seeing Katrina Obrien back in the office today. She reports doing fairly well except occasionally she gets somewhat "swimmy headed". This is worse with change of position. As previously noted her EF has improved back to 55-60%. Recently she's had persistently elevated lipoprotein particles including an LDL particle number in the 1900s. This is despite being on Zetia and Crestor 40 mg. Based on this I recommended that she start on Pralulent. She's been taking this now for just over a month and is having no problems with injection site reactions or side effects. We will plan to check her cholesterol again in about 2 months.  Katrina Obrien was seen in the office today in follow-up. Overall she seems to be doing very well. 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 She will be due for repeat cholesterol test after the first of the year. She denies any chest pain or worsening shortness of breath. She is complaining of some occasional swelling in her lower extremities which improves in the morning. She also gets some pain in her right leg. She is overdue for repeat lower extremity Dopplers and does have a history of course of arterial thrombus in the leg. Warfarin has been therapeutic and managed by the Coumadin clinic. Cholesterol is still not at goal despite max dose therapy. For brief period of time she was on samples of Repatha (PCSK9 inhibitor) with a marked response in cholesterol, however  insurance would not cover this.  12/10/2015  Katrina Obrien returns today for follow-up. She says this is the best she has felt some time. She denies any chest pain or worsening shortness of breath. Recently her cholesterol numbers have risen with LDL now 102. This would put her not at goal therapy. Given her multiple comorbidities. Recent data indicates the fact  that driving LDL below 50 may improve cardiovascular outcomes. This was with the use of maximal statin therapy as well as the addition of a PCSK9 inhibitor, specifically Repatha. In fact, she had responded very well to Pralulent in the past, with LDL as low as 20 however that was not covered by insurance. 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 Aggressive therapy is warranted.  07/15/2016  Katrina Obrien returns today for follow-up. Overall she feels very well. She had one episode of intense burning in her chest which happened a few weeks ago. It was worse when she was laying down at night. She did not take any medicine for that she was concerned she could take it with her other medicines. It sounds like reflux she's not had any more symptoms like that. I told her she could use Tums or an H2 blocker for that as needed. She recently went on to [place] and her LDL cholesterol is now 19. This should be a significant benefit in slowing her overall cardiovascular risks. As mentioned previously she had an echo this past year which showed normal LVEF 55-60% and a normally functioning mechanical aortic valve. Her INR was therapeutic today.  07/09/2017  Katrina Obrien was seen today in follow-up.  She seems to be doing very well.  She reported that she had to shovel snow for several hours with her son and at the end of that did develop some chest tightness/soreness which went away very quickly.  She said she felt like she overworked herself, but generally has had no symptoms.  Recently we repeated a lipid profile which showed marked improvement in her LDL to 28 on statin, ezetimibe and Repatha.  Her triglycerides were elevated at 349, however she mentioned today that this was nonfasting.  Also her blood sugar was elevated recently.  She was started on Jardiance, which I suspect has made a big difference in her blood sugars.  She says  her hemoglobin A1c now is down to 6.1.  Accordingly, her triglycerides have probably improved as well.  Fortunately, her husband was not with her today.  Apparently he is suffering from depression and has had several bouts.  He is not interested in leaving the house and is not as engaged in activities as he has been in the past.  This is obviously causing her a lot of stress and is not clear to her how she can help him.  03/19/2018  Katrina Obrien returns today for follow-up.  She says she is felt today the best she has in years.  Unfortunately, she had a recent colonoscopy which turned out okay however having to come off all of her medications was challenging for her and she was not a fan of bridging Lovenox.  She does report her blood sugars been well controlled.  Her cholesterol is at goal with recent LDL of 58 however it is actually been lower with Repatha.  Her triglycerides are also well controlled at 110.  Her Saint Jude mechanical valve is functioning properly by echo in 2017 which showed normal LVEF.  She  denies any chest pain symptoms with history of three-vessel CABG in 2012.  Blood pressure is at goal today as well.  EKG shows a stable left bundle branch block.  09/16/2018  Katrina Obrien is seen today in follow-up.  She is excited at the fact that she will be having a new grandbaby fairly soon.  Overall she is doing well without new symptoms.  She denies any chest pain or worsening shortness of breath.  Her INR was repeated today was 3.5.  No adjustments were made to her warfarin.  Her mechanical valve is working properly and was last assessed by echo last year.  She has lost some additional weight.  Her blood pressure was elevated little today however she has been under some stress and very recently took her medications.   PMHx:  Past Medical History:  Diagnosis Date  . 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 Clotting  disorder (HCC)    anticoagulant - coumadin hx DVT, PE  . Coronary artery disease    s/p 3- vessel bypass   . Diabetes mellitus without complication ([place])   . DVT (deep venous thrombosis) ([place])   . Heart murmur   . Hyperlipidemia   . Hypertension   . Ischemic cardiomyopathy    40 to 45%  . Left bundle branch block   . Mechanical heart valve present    Jan 2012  . Popliteal artery occlusion, left (HCC)    recent  status  post angioplasty  . Pulmonary embolism ([place])    associated with DVT  . Pulmonary embolus ([place])   . Thyroid disease    hyperthyroidism    Past Surgical History:  Procedure Laterality Date  . AORTIC VALVE REPLACEMENT (AVR)/CORONARY ARTERY BYPASS GRAFTING (CABG)  08/16/2010  VAN TRIGT   AV replacement w/19-mm mechanical St Jude valve ([id number]),CABGX3  lima to LAD, SVG to distal circ,SVG to posterior descending  . ASD REPAIR    . CARDIAC CATHETERIZATION  08/08/2010   ,LAD 50% PROX,605 TANDEM SEGMENTAL STENOSIS,LEFT CIRC 60%-AV GROOVE, RGT COR 40% PROX ON THE BEND ,80% PROX PDA,  . CORONARY ARTERY BYPASS GRAFT  08/16/2010   LIMA TO lad,svg to distal circ ,svg to posterior descending  . DOPPLER ECHOCARDIOGRAPHY  08/09/2010   EF35% to 40% ,bbicuspid ;severely  thickened ,severely calcified leaflets, LV normal  . LEA doppler  08/07/2010   left popliteal occlusion ,and anterior tibial complete occlusion  . NM MYOCAR PERF WALL MOTION  08/06/2010   EF 31%  LOW RISK SCAN  . nuc    . PV ANGIOGRAM  08/08/2010   successful PTA LEFT POPLITEAL    FAMHx:  Family History  Problem Relation Age of Onset  . Cancer Mother   . Cancer Father   . CAD Father   . Heart attack Father 41       first MI, 2 major after that, multiple silent MIs  . Stroke Brother   . CAD Brother 29       first stent  . Heart attack Brother   . CAD Brother 29       CABG   . Hypertension Brother   . Heart attack Brother 39       died from MI  . Colon cancer Neg Hx   . Esophageal cancer Neg  Hx   . Rectal cancer Neg Hx   . Stomach cancer Neg Hx  SOCHx:   reports that she has never smoked. She has never used smokeless tobacco. She reports current alcohol use of about 7.0 standard drinks of alcohol per week. She reports that she does not use drugs.  ALLERGIES:  Allergies  Allergen Reactions  . Niaspan [Niacin Er] Other (See Comments)    Flushing, even with ASA    ROS: Pertinent items noted in HPI and remainder of comprehensive ROS otherwise negative.  HOME MEDS: Current Outpatient Medications  Medication Sig Dispense Refill  . aspirin EC 81 MG tablet Take 81 mg by mouth daily.    . furosemide (LASIX) 40 MG tablet TAKE 1/2 TABLET BY MOUTH DAILY. 45 tablet 2  . JARDIANCE 25 MG TABS tablet Take 25 mg by mouth daily. 90 tablet 1  . levothyroxine (SYNTHROID, LEVOTHROID) 50 MCG tablet Take 50 mcg by mouth daily before breakfast.    . metFORMIN (GLUMETZA) 500 MG (MOD) 24 hr tablet Take 1 tablet (500 mg total) by mouth 2 (two) times daily with a meal. 180 tablet 1  . metoprolol succinate (TOPROL-XL) 100 MG 24 hr tablet TAKE 1 TABLET BY MOUTH DAILY. TAKE WITH OR IMMEDIATELY FOLLOWING A MEAL. 90 tablet 2  . ramipril (ALTACE) 5 MG capsule Take 5 mg by mouth daily.    Marland Kitchen REPATHA SURECLICK 326 MG/ML SOAJ INJECT 140MG  SUBCUTANEOUSLY EVERY 14 DAYS 6 pen 3  . rosuvastatin (CRESTOR) 40 MG tablet TAKE 1 TABLET BY MOUTH DAILY. 90 tablet 2  . warfarin (COUMADIN) 2 MG tablet TAKE 1 TO 1 AND 1/2 TABLETS BY MOUTH DAILY AS DIRECTED BY COUMADIN CLINIC. 120 tablet 2   No current facility-administered medications for this visit.     LABS/IMAGING: Results for orders placed or performed in visit on 09/15/18 (from the past 48 hour(s))  VITAMIN D 25 Hydroxy (Vit-D Deficiency, Fractures)     Status: None   Collection Time: 09/15/18  2:12 PM  Result Value Ref Range   Vit D, 25-Hydroxy 42.9 30.0 - 100.0 ng/mL    Comment: Vitamin D deficiency has been defined by the [place] practice guideline as a level of serum 25-OH vitamin D less than 20 ng/mL (1,2). The Endocrine Society went on to further define vitamin D insufficiency as a level between 21 and 29 ng/mL (2). 1. IOM (Institute of Medicine). 2010. Dietary reference    intakes for calcium and D. [place]: The    [employer]. 2. Holick MF, Binkley [place], Bischoff-Ferrari HA, et al.    Evaluation, treatment, and prevention of vitamin D    deficiency: an Endocrine Society clinical practice    guideline. JCEM. 2011 Jul; 96(7):1911-30.   Comprehensive metabolic panel     Status: Abnormal   Collection Time: 09/15/18  2:12 PM  Result Value Ref Range   Glucose 131 (H) 65 - 99 mg/dL   BUN 35 (H) 8 - 27 mg/dL   Creatinine, Ser 0.84 0.57 - 1.00 mg/dL   GFR calc non Af Amer 75 >59 mL/min/1.73   GFR calc Af Amer 87 >59 mL/min/1.73   BUN/Creatinine Ratio 42 (H) 12 - 28   Sodium 140 134 - 144 mmol/L   Potassium 4.1 3.5 - 5.2 mmol/L   Chloride 102 96 - 106 mmol/L   CO2 20 20 - 29 mmol/L   Calcium 9.9 8.7 - 10.3 mg/dL   Total Protein 6.9 6.0 - 8.5 g/dL   Albumin 4.7 3.8 - 4.8 g/dL    Comment:               **  Please note reference interval change**   Globulin, Total 2.2 1.5 - 4.5 g/dL   Albumin/Globulin Ratio 2.1 1.2 - 2.2   Bilirubin Total 0.3 0.0 - 1.2 mg/dL   Alkaline Phosphatase 63 39 - 117 IU/L   AST 19 0 - 40 IU/L   ALT 28 0 - 32 IU/L   No results found.  VITALS: BP (!) 162/74   Pulse 80   Ht 5\' 3"  (1.6 m)   Wt 159 lb 12.8 oz (72.5 kg)   LMP  (LMP Unknown)   BMI 28.31 kg/m   EXAM: General appearance: alert and no distress Neck: no adenopathy, no carotid bruit, no JVD, supple, symmetrical, trachea midline and thyroid not enlarged, symmetric, no tenderness/mass/nodules Lungs: clear to auscultation bilaterally Heart: regular rate and rhythm, S1, S2 normal, no murmur, click, rub or gallop and Sharp mechanical valve sound Abdomen: soft, non-tender; bowel sounds  normal; no masses,  no organomegaly Extremities: extremities normal, atraumatic, no cyanosis or edema Pulses: 2+ and symmetric Skin: Skin color, texture, turgor normal. No rashes or lesions Neurologic: Grossly normal  EKG: Sinus rhythm with PVCs at 80, nonspecific IVCD-personally reviewed  ASSESSMENT: 1. Coronary artery disease status post 3 vessel CABG (LIMA to LAD, SVG to circumflex, SVG to PDA) - 07/2010 2. Severe aortic stenosis and a bicuspid aortic valve, status post 19 mm St. Jude mechanical aortic valve excellent recent left popliteal artery occlusion with rest pain status post angioplasty (2012) 3. History of pulmonary emboli associated with DVT 4. Ischemic cardiomyopathy, EF now normalized at 55-60% 5. Hypertension - at goal 6. Familial hyperlipidemia - LDL>190 on Repatha 7. Diabetes type 2 - controlled 8. LBBB   PLAN: 1.   Katrina Obrien remains asymptomatic.  She is tolerating Repatha and has had marked improvement in her lipid profile now a goal LDL less than 70.  She will need a repeat lipid profile in 6 months prior to follow-up.  Blood pressure is at goal.  Her INR is therapeutic on warfarin.  Plan continued work at weight loss and more regular exercise.  Follow-up with me in 6 months or sooner as necessary.  Pixie Casino, MD, [facility], [place] Director of the Advanced Lipid Disorders &  Cardiovascular Risk Reduction Clinic Attending Cardiologist  Direct Dial: [phone]  Fax: [phone]  Website:  [email or web address]  09/16/2018, 1:55 PM

## 2018-09-27 ENCOUNTER — Ambulatory Visit
Admission: EM | Admit: 2018-09-27 | Discharge: 2018-09-27 | Disposition: A | Discharge status: Home / Self Care | Payer: BC Managed Care – PPO | Attending: Emergency Medicine | Admitting: Emergency Medicine

## 2018-09-27 DIAGNOSIS — J0140 Acute pansinusitis, unspecified: Secondary | ICD-10-CM

## 2018-09-27 MED ORDER — FLUTICASONE PROPIONATE 50 MCG/ACT NA SUSP: 2.0000 | Freq: Every day | NASAL | 0 refills | Status: DC

## 2018-09-27 MED ORDER — AMOXICILLIN-POT CLAVULANATE 875-125 MG PO TABS: 1.0000 | ORAL_TABLET | Freq: Two times a day (BID) | ORAL | 0 refills | Status: DC

## 2018-09-27 NOTE — Discharge Instructions (Signed)
Take the medication as written.  Continue Flonase.  start Mucinex to keep the mucous thin and to decongest you.  Return to the ER if you get worse, have a fever >100.4, or for any concerns. You may take 1 gram of tylenol up to 3-4 times a day as needed for pain. Use a NeilMed sinus rinse as often as you want with distilled water to reduce nasal congestion. Follow the directions on the box.   Go to www.goodrx.com to look up your medications. This will give you a list of where you can find your prescriptions at the most affordable prices. Or you can ask the pharmacist what the cash price is. This is frequently cheaper than going through insurance.

## 2018-09-27 NOTE — ED Provider Notes (Signed)
HPI  SUBJECTIVE:  Katrina Obrien is a 63 y.o. female who presents with 1 week of nasal congestion, rhinorrhea.  States that she has been cleaning her father's house out and has been exposed to a lot of dust.  She reports severe left maxillary sinus pain and pressure and color in the change of her nasal congestion starting 2 days ago.  She reports postnasal drip, left ear pain.  No fevers, facial swelling, upper dental pain.  No change in hearing, otorrhea, antibiotics in the past month.  She took Tylenol within 4 to 6 hours of evaluation.  She has tried a heating pad, Tylenol and Flonase.  The heating pad and Tylenol seem to help.  Symptoms are worse with bending/lying down.  She has a past medical history of diabetes, hypertension, DVT/PE for which she takes warfarin, sinusitis, CABG x3, aortic stenosis, status post atrial valve replacement.  Pt states this feels identical to previous episodes of sinusitis.  ENI:DPOEUMP, Neoma Laming, DO     Past Medical History:  Diagnosis Date  . 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 Clotting disorder (HCC)    anticoagulant - coumadin hx DVT, PE  . Coronary artery disease    s/p 3- vessel bypass   . Diabetes mellitus without complication ([place])   . DVT (deep venous thrombosis) ([place])   . Heart murmur   . Hyperlipidemia   . Hypertension   . Ischemic cardiomyopathy    40 to 45%  . Left bundle branch block   . Mechanical heart valve present    Jan 2012  . Popliteal artery occlusion, left (HCC)    recent  status  post angioplasty  . Pulmonary embolism ([place])    associated with DVT  . Pulmonary embolus ([place])   . Thyroid disease    hyperthyroidism    Past Surgical History:  Procedure Laterality Date  . AORTIC VALVE REPLACEMENT (AVR)/CORONARY ARTERY BYPASS GRAFTING (CABG)  08/16/2010  VAN TRIGT   AV replacement w/19-mm mechanical St Jude valve ([id number]),CABGX3  lima  to LAD, SVG to distal circ,SVG to posterior descending  . ASD REPAIR    . CARDIAC CATHETERIZATION  08/08/2010   ,LAD 50% PROX,605 TANDEM SEGMENTAL STENOSIS,LEFT CIRC 60%-AV GROOVE, RGT COR 40% PROX ON THE BEND ,80% PROX PDA,  . CORONARY ARTERY BYPASS GRAFT  08/16/2010   LIMA TO lad,svg to distal circ ,svg to posterior descending  . DOPPLER ECHOCARDIOGRAPHY  08/09/2010   EF35% to 40% ,bbicuspid ;severely  thickened ,severely calcified leaflets, LV normal  . LEA doppler  08/07/2010   left popliteal occlusion ,and anterior tibial complete occlusion  . NM MYOCAR PERF WALL MOTION  08/06/2010   EF 31%  LOW RISK SCAN  . nuc    . PV ANGIOGRAM  08/08/2010   successful PTA LEFT POPLITEAL    Family History  Problem Relation Age of Onset  . Cancer Mother   . Cancer Father   . CAD Father   . Heart attack Father 38       first MI, 2 major after that, multiple silent MIs  . Stroke Brother   . CAD Brother 26       first stent  . Heart attack Brother   . CAD Brother 27       CABG   . Hypertension Brother   . Heart attack Brother 22       died from MI  .  Colon cancer Neg Hx   . Esophageal cancer Neg Hx   . Rectal cancer Neg Hx   . Stomach cancer Neg Hx     Social History   Tobacco Use  . Smoking status: Never Smoker  . Smokeless tobacco: Never Used  Substance Use Topics  . Alcohol use: Yes    Alcohol/week: 7.0 standard drinks    Types: 7 Standard drinks or equivalent per week  . Drug use: No    No current facility-administered medications for this encounter.   Current Outpatient Medications:  .  amoxicillin-clavulanate (AUGMENTIN) 875-125 MG tablet, Take 1 tablet by mouth 2 (two) times daily. X 7 days, Disp: 14 tablet, Rfl: 0 .  aspirin EC 81 MG tablet, Take 81 mg by mouth daily., Disp: , Rfl:  .  fluticasone (FLONASE) 50 MCG/ACT nasal spray, Place 2 sprays into both nostrils daily., Disp: 16 g, Rfl: 0 .  furosemide (LASIX) 40 MG tablet, TAKE 1/2 TABLET BY MOUTH DAILY., Disp:  45 tablet, Rfl: 2 .  JARDIANCE 25 MG TABS tablet, Take 25 mg by mouth daily., Disp: 90 tablet, Rfl: 1 .  levothyroxine (SYNTHROID, LEVOTHROID) 50 MCG tablet, Take 50 mcg by mouth daily before breakfast., Disp: , Rfl:  .  metFORMIN (GLUMETZA) 500 MG (MOD) 24 hr tablet, Take 1 tablet (500 mg total) by mouth 2 (two) times daily with a meal., Disp: 180 tablet, Rfl: 1 .  metoprolol succinate (TOPROL-XL) 100 MG 24 hr tablet, TAKE 1 TABLET BY MOUTH DAILY. TAKE WITH OR IMMEDIATELY FOLLOWING A MEAL., Disp: 90 tablet, Rfl: 2 .  ramipril (ALTACE) 5 MG capsule, Take 5 mg by mouth daily., Disp: , Rfl:  .  REPATHA SURECLICK 092 MG/ML SOAJ, INJECT 140MG  SUBCUTANEOUSLY EVERY 14 DAYS, Disp: 6 pen, Rfl: 3 .  rosuvastatin (CRESTOR) 40 MG tablet, TAKE 1 TABLET BY MOUTH DAILY., Disp: 90 tablet, Rfl: 2 .  warfarin (COUMADIN) 2 MG tablet, TAKE 1 TO 1 AND 1/2 TABLETS BY MOUTH DAILY AS DIRECTED BY COUMADIN CLINIC., Disp: 120 tablet, Rfl: 2  Allergies  Allergen Reactions  . Niaspan [Niacin Er] Other (See Comments)    Flushing, even with ASA     ROS  As noted in HPI.   Physical Exam  BP (!) 161/74 (BP Location: Right Arm)   Pulse 85   Temp (!) 97.3 F (36.3 C) (Oral)   Resp 12   LMP  (LMP Unknown)   SpO2 96%   Constitutional: Well developed, well nourished, no acute distress Eyes:  EOMI, conjunctiva normal bilaterally HENT: Normocephalic, atraumatic,mucus membranes moist.  TMs normal bilaterally.  Positive purulent nasal congestion.  Erythematous, swollen turbinates.  Positive exquisite left maxillary and frontal sinus tenderness.  Normal oropharynx.  Positive cobblestoning and postnasal drip. Respiratory: Normal inspiratory effort Cardiovascular: Normal rate GI: nondistended skin: No rash, skin intact Musculoskeletal: no deformities Neurologic: Alert & oriented x 3, no focal neuro deficits Psychiatric: Speech and behavior appropriate   ED Course   Medications - No data to display  No orders of  the defined types were placed in this encounter.   No results found for this or any previous visit (from the past 24 hour(s)). No results found.  ED Clinical Impression  Acute non-recurrent pansinusitis   ED Assessment/Plan  Continue Flonase, start saline nasal irrigation, Augmentin due to the severity of symptoms, Tylenol 1 g 3 or 4 times a day as needed for pain.  Follow-up with PMD in several days, to the ER if she gets  worse.  Discussed MDM, treatment plan, and plan for follow-up with patient. Discussed sn/sx that should prompt return to the ED. patient agrees with plan.   Meds ordered this encounter  Medications  . fluticasone (FLONASE) 50 MCG/ACT nasal spray    Sig: Place 2 sprays into both nostrils daily.    Dispense:  16 g    Refill:  0  . amoxicillin-clavulanate (AUGMENTIN) 875-125 MG tablet    Sig: Take 1 tablet by mouth 2 (two) times daily. X 7 days    Dispense:  14 tablet    Refill:  0    *This clinic note was created using [employer]. Therefore, there may be occasional mistakes despite careful proofreading.   ?    Melynda Ripple, MD 09/28/18 [phone]

## 2018-09-27 NOTE — ED Triage Notes (Signed)
Patient complains of headache, congestion, cough with Katrina Obrien/yellow sputum X 2 days.

## 2018-09-28 ENCOUNTER — Ambulatory Visit: Payer: BC Managed Care – PPO | Admitting: Family Medicine

## 2018-10-01 ENCOUNTER — Other Ambulatory Visit: Payer: Self-pay

## 2018-10-01 MED ORDER — EVOLOCUMAB 140 MG/ML ~~LOC~~ SOAJ: 140.0000 mg | SUBCUTANEOUS | 3 refills | Status: DC

## 2018-10-13 ENCOUNTER — Other Ambulatory Visit: Payer: Self-pay | Admitting: Pharmacist

## 2018-10-13 MED ORDER — EVOLOCUMAB 140 MG/ML ~~LOC~~ SOAJ: 140.0000 mg | SUBCUTANEOUS | 3 refills | Status: DC

## 2018-10-13 NOTE — Telephone Encounter (Signed)
Repatha PA renewed until 10/12/2019

## 2018-10-28 ENCOUNTER — Telehealth: Payer: Self-pay

## 2018-10-28 NOTE — Telephone Encounter (Signed)

## 2018-11-01 ENCOUNTER — Other Ambulatory Visit: Payer: Self-pay

## 2018-11-01 MED ORDER — RAMIPRIL 5 MG PO CAPS: 5.0000 mg | ORAL_CAPSULE | Freq: Every day | ORAL | 0 refills | Status: DC

## 2018-11-01 MED ORDER — LEVOTHYROXINE SODIUM 50 MCG PO TABS: 50.0000 ug | ORAL_TABLET | Freq: Every day | ORAL | 1 refills | Status: DC

## 2018-11-01 NOTE — Telephone Encounter (Signed)
Pharmacy sent refill request for Ramipril an levothyroxine, medications were last filled by pervious provider. LOV 09/13/2018.  Please review and advised. MPulliam, CMA/RT(R)

## 2018-11-01 NOTE — Progress Notes (Signed)
This encounter was created in error - please disregard.

## 2018-11-02 ENCOUNTER — Ambulatory Visit (INDEPENDENT_AMBULATORY_CARE_PROVIDER_SITE_OTHER): Payer: BC Managed Care – PPO | Admitting: *Deleted

## 2018-11-02 ENCOUNTER — Other Ambulatory Visit: Payer: Self-pay

## 2018-11-02 DIAGNOSIS — Z7901 Long term (current) use of anticoagulants: Secondary | ICD-10-CM

## 2018-11-02 DIAGNOSIS — Z952 Presence of prosthetic heart valve: Secondary | ICD-10-CM | POA: Diagnosis not present

## 2018-11-02 LAB — POCT INR: INR: 5.3 — AB (ref 2.0–3.0)

## 2018-11-02 NOTE — Patient Instructions (Signed)
Description   Spoke with pt and instructed to hold today's dose and hold tomorrow's dose then continue 3 mg (1.5 tabs) daily except 2 mg (1 tab) Tuesday, Thursday and Saturday.  Repeat INR 2 weeks.

## 2018-11-15 ENCOUNTER — Telehealth: Payer: Self-pay | Admitting: *Deleted

## 2018-11-15 ENCOUNTER — Telehealth: Payer: Self-pay

## 2018-11-15 NOTE — Telephone Encounter (Signed)

## 2018-11-15 NOTE — Telephone Encounter (Signed)
lmom for prescreen  

## 2018-11-16 ENCOUNTER — Ambulatory Visit (INDEPENDENT_AMBULATORY_CARE_PROVIDER_SITE_OTHER): Payer: BC Managed Care – PPO | Admitting: Pharmacist

## 2018-11-16 ENCOUNTER — Other Ambulatory Visit: Payer: Self-pay

## 2018-11-16 DIAGNOSIS — Z7901 Long term (current) use of anticoagulants: Secondary | ICD-10-CM

## 2018-11-16 DIAGNOSIS — Z952 Presence of prosthetic heart valve: Secondary | ICD-10-CM | POA: Diagnosis not present

## 2018-11-16 LAB — POCT INR: INR: 3.8 — AB (ref 2.0–3.0)

## 2018-12-14 ENCOUNTER — Telehealth: Payer: Self-pay

## 2018-12-14 ENCOUNTER — Ambulatory Visit (INDEPENDENT_AMBULATORY_CARE_PROVIDER_SITE_OTHER): Payer: BC Managed Care – PPO | Admitting: Family Medicine

## 2018-12-14 ENCOUNTER — Other Ambulatory Visit: Payer: Self-pay

## 2018-12-14 ENCOUNTER — Encounter: Payer: Self-pay | Admitting: Family Medicine

## 2018-12-14 VITALS — BP 112/57 | HR 71 | Temp 97.9°F | Ht 63.0 in | Wt 162.5 lb

## 2018-12-14 DIAGNOSIS — E1169 Type 2 diabetes mellitus with other specified complication: Secondary | ICD-10-CM | POA: Diagnosis not present

## 2018-12-14 DIAGNOSIS — E1159 Type 2 diabetes mellitus with other circulatory complications: Secondary | ICD-10-CM

## 2018-12-14 DIAGNOSIS — E785 Hyperlipidemia, unspecified: Secondary | ICD-10-CM | POA: Diagnosis not present

## 2018-12-14 DIAGNOSIS — I10 Essential (primary) hypertension: Secondary | ICD-10-CM | POA: Diagnosis not present

## 2018-12-14 MED ORDER — METFORMIN HCL ER 500 MG PO TB24: ORAL_TABLET | ORAL | 0 refills | Status: DC

## 2018-12-14 NOTE — Progress Notes (Signed)
Telehealth office visit note for Mellody Dance, D.O- at [facility]   I connected with current patient today and verified that I am speaking with the correct person using two identifiers.   . Location of the patient: Home . Location of the provider: Office Only the patient (+/- their family members at pt's discretion) and myself were participating in the encounter    - This visit type was conducted due to national recommendations for restrictions regarding the COVID-19 Pandemic (e.g. social distancing) in an effort to limit this patient's exposure and mitigate transmission in our community.  This format is felt to be most appropriate for this patient at this time.   - The patient did not have access to video technology or had technical difficulties with video requiring transitioning to audio format only. - No physical exam could be performed with this format, beyond that communicated to [place] by the patient/ family members as noted.   - Additionally my office staff/ schedulers discussed with the patient that there may be a monetary charge related to this service, depending on their medical insurance.   The patient expressed understanding, and agreed to proceed.       History of Present Illness:   - been bad lately- being confined to house is bad for her--> cooking and eating everything in sight.  Patient has not been exercising and the whole COVID thing really threw her for loop.  She denies having anxiety or depression but it definitely altered the way she lives and she felt that emotionally. - been back on track only 2-3 wks now with her diet and eating better   Diabetes: Last office visit on 09/13/2018 we increased her metformin from 500 to 1000 mg twice daily this was due to her A1c increasing from 6.5 prior to then at that time 7.2- this was 3 mo ago. HOWEVER PT DID NOT MAKE THIS CHANGE.     Has been only taking 2 at night.  - Remains on jardiance [address]-  154 FBS: 159, 146, 144, 146 -  Patient was on the thousand twice daily with Dr. Soyla Murphy in the past and did not tolerate due to severe diarrhea. - pt states BS have been running around where it was prior.  HTN:  - bp at home- runs around 117/59, 134/72,  - average since early May: 124/60 pulse- in 70's -  - no sx at all  Pt has been doing drive thru coumadin visits.    Impression and Recommendations:    1. Type 2 diabetes mellitus with other specified complication, without long-term current use of insulin ([place])   2. Hypertension associated with diabetes ([place])   3. Hyperlipidemia associated with type 2 diabetes mellitus ([place])     -We will increase metformin dose from 1000 in the evening to 500 in the a.m. and 1000 in the evening of the XR.  Patient would knows to continue on the [place] as well.  We will continue to monitor her blood sugars, blood pressures and try to work on diet and exercise in addition to taking the meds.  -Blood pressure very well controlled.  Patient has a history of white coat syndrome on top of hypertension but home blood pressures have been under excellent control and patient has been checking regularly and very compliant.  Continue Ramapril for renoprotectiveness  - As part of my medical decision making, I reviewed the following data within the electronic medical  record:  History obtained from pt /family, CMA notes reviewed and incorporated if applicable, Labs reviewed, Radiograph/ tests reviewed if applicable and OV notes from prior OV's with me, as well as other specialists she/he has seen since seeing me last, were all reviewed and used in my medical decision making process today.   - Additionally, discussion had with patient regarding txmnt plan, and their biases/concerns about that plan were used in my medical decision making today.   - The patient agreed with the plan and demonstrated an understanding of the instructions.   No barriers to understanding were  identified.   - Red flag symptoms and signs discussed in detail.  Patient expressed understanding regarding what to do in case of emergency\ urgent symptoms.  The patient was advised to call back or seek an in-person evaluation if the symptoms worsen or if the condition fails to improve as anticipated.   Return in about 3 months (around 03/16/2019) for Added a.m. metformin to p.m. dose, needs A1c.    No orders of the defined types were placed in this encounter.   Meds ordered this encounter  Medications  . metFORMIN (GLUCOPHAGE XR) 500 MG 24 hr tablet    Sig: 1 tab in am and 2 tabs nightly    Dispense:  270 tablet    Refill:  0    Medications Discontinued During This Encounter  Medication Reason  . amoxicillin-clavulanate (AUGMENTIN) 875-125 MG tablet Completed Course  . metFORMIN (GLUMETZA) 500 MG (MOD) 24 hr tablet       I provided 22+ minutes of non-face-to-face time during this encounter,with over 50% of the time in direct counseling on patients medical conditions/ medical concerns.  Additional time was spent with charting and coordination of care after the actual visit commenced.   Note:  This note was prepared with assistance of Dragon voice recognition software. Occasional wrong-word or sound-a-like substitutions may have occurred due to the inherent limitations of voice recognition software.  Mellody Dance, DO     Patient Care Team    Relationship Specialty Notifications Start End  Mellody Dance, DO PCP - General Family Medicine  02/03/18   Pixie Casino, MD PCP - Cardiology Cardiology Admissions 03/05/18   Jacelyn Pi, MD Attending Physician Endocrinology  01/26/13   Pixie Casino, MD Consulting Physician Cardiology  02/03/18      -Vitals obtained; medications/ allergies reconciled;  personal medical, social, Sx etc.histories were updated by CMA, reviewed by me and are reflected in chart   Patient Active Problem List   Diagnosis Date Noted  .  Hyperlipidemia associated with type 2 diabetes mellitus ([place]) 02/03/2018    Priority: High  . 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 DM2 (diabetes mellitus, type 2) ([place]) 01/26/2013    Priority: High  . Hypertension associated with diabetes ([place]) 01/26/2013    Priority: High  . Hypothyroidism 02/03/2018    Priority: Medium  . 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 DVT (deep venous thrombosis) ([place]) 01/26/2013    Priority: Medium  . Pulmonary embolus ([place]) 01/26/2013    Priority: Medium  . Vitamin D insufficiency 09/13/2018    Priority: Low  . S/P AVR 01/26/2013    Priority: Low  . Long term (current) use of anticoagulants 10/11/2012    Priority: Low  . White coat syndrome with diagnosis of hypertension 09/13/2018  . Acute maxillary sinusitis 04/19/2018  . LBBB (left bundle branch block) 04/06/2014  .  Aortic stenosis 01/26/2013  . 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 Cardiomyopathy, ischemic 01/26/2013     Current Meds  Medication Sig  . aspirin EC 81 MG tablet Take 81 mg by mouth daily.  . Evolocumab (REPATHA SURECLICK) 809 MG/ML SOAJ Inject 140 mg into the skin every 14 (fourteen) days.  . fluticasone (FLONASE) 50 MCG/ACT nasal spray Place 2 sprays into both nostrils daily.  . furosemide (LASIX) 40 MG tablet TAKE 1/2 TABLET BY MOUTH DAILY.  Marland Kitchen JARDIANCE 25 MG TABS tablet Take 25 mg by mouth daily.  Marland Kitchen levothyroxine (SYNTHROID, LEVOTHROID) 50 MCG tablet Take 1 tablet (50 mcg total) by mouth daily before breakfast.  . metoprolol succinate (TOPROL-XL) 100 MG 24 hr tablet TAKE 1 TABLET BY MOUTH DAILY. TAKE WITH OR IMMEDIATELY FOLLOWING A MEAL.  . ramipril (ALTACE) 5 MG capsule Take 1 capsule (5 mg total) by mouth daily.  . rosuvastatin (CRESTOR) 40 MG tablet TAKE 1 TABLET BY MOUTH DAILY.  Marland Kitchen warfarin (COUMADIN) 2 MG tablet TAKE 1 TO 1 AND 1/2 TABLETS BY MOUTH DAILY AS DIRECTED BY COUMADIN CLINIC.  . [DISCONTINUED] metFORMIN (GLUMETZA) 500  MG (MOD) 24 hr tablet Take 1 tablet (500 mg total) by mouth 2 (two) times daily with a meal.     Allergies:  Allergies  Allergen Reactions  . Niaspan [Niacin Er] Other (See Comments)    Flushing, even with ASA     ROS:  See above HPI for pertinent positives and negatives   Objective:   Blood pressure (!) 112/57, pulse 71, temperature 97.9 F (36.6 C), height 5\' 3"  (1.6 m), weight 162 lb 8 oz (73.7 kg).  (if some vitals are omitted, this means that patient was UNABLE to obtain them even though they were asked to get them prior to OV today.  They were asked to call us at their earliest convenience with these once obtained. )  General: A & O * 3; sounds in no acute distress; in usual state of health.  Skin: Pt confirms warm and dry extremities and pink fingertips HEENT: Pt confirms lips non-cyanotic Chest: Patient confirms normal chest excursion and movement Respiratory: speaking in full sentences, no conversational dyspnea; patient confirms no use of accessory muscles Psych: insight appears good, mood- appears full

## 2018-12-14 NOTE — Telephone Encounter (Signed)

## 2018-12-15 ENCOUNTER — Ambulatory Visit (INDEPENDENT_AMBULATORY_CARE_PROVIDER_SITE_OTHER): Payer: BC Managed Care – PPO | Admitting: Pharmacist

## 2018-12-15 DIAGNOSIS — Z7901 Long term (current) use of anticoagulants: Secondary | ICD-10-CM

## 2018-12-15 DIAGNOSIS — Z952 Presence of prosthetic heart valve: Secondary | ICD-10-CM | POA: Diagnosis not present

## 2018-12-15 LAB — POCT INR: INR: 4.5 — AB (ref 2.0–3.0)

## 2018-12-29 ENCOUNTER — Telehealth: Payer: Self-pay

## 2018-12-29 NOTE — Telephone Encounter (Signed)

## 2019-01-05 ENCOUNTER — Ambulatory Visit (INDEPENDENT_AMBULATORY_CARE_PROVIDER_SITE_OTHER): Payer: BC Managed Care – PPO | Admitting: Pharmacist Clinician (PhC)/ Clinical Pharmacy Specialist

## 2019-01-05 ENCOUNTER — Other Ambulatory Visit: Payer: Self-pay

## 2019-01-05 DIAGNOSIS — Z7901 Long term (current) use of anticoagulants: Secondary | ICD-10-CM

## 2019-01-05 DIAGNOSIS — Z952 Presence of prosthetic heart valve: Secondary | ICD-10-CM | POA: Diagnosis not present

## 2019-01-05 LAB — POCT INR: INR: 3.2 — AB (ref 2.0–3.0)

## 2019-01-20 ENCOUNTER — Telehealth: Payer: Self-pay | Admitting: Internal Medicine

## 2019-01-20 NOTE — Telephone Encounter (Signed)
Ms Carrero is calling in regards to a MyChart message she received from [place] and she is stating that her Repatha authorization was for  10/12/18-10/12/19. But today she received a letter stating that her insurance will no longer cover Repatha but she can take Praluent. She has a number to appeal it is [phone].

## 2019-01-21 NOTE — Telephone Encounter (Signed)
LMTCB.   Per MD, OK to change to Praluent 150mg /mL once every 14 days

## 2019-01-21 NOTE — Telephone Encounter (Signed)
Do you want to pursue an appeal or change to equivalent Praluent therapy?

## 2019-01-21 NOTE — Telephone Encounter (Signed)
We can switch to Praluent - should be similar.  Dr. Lemmie Evens

## 2019-01-24 NOTE — Telephone Encounter (Signed)
Spoke with patient. She has a 3 month supply of repatha. She is OK with change to praluent but does not need refill at this time. Will plan to send Rx to pharmacy in about 1 month  She reports her repatha PA approval was good from 09/2018 - 09/2019

## 2019-01-24 NOTE — Telephone Encounter (Signed)
Patient returned your call.

## 2019-01-25 ENCOUNTER — Other Ambulatory Visit: Payer: Self-pay | Admitting: Family Medicine

## 2019-01-25 ENCOUNTER — Other Ambulatory Visit: Payer: Self-pay | Admitting: Internal Medicine

## 2019-02-02 ENCOUNTER — Telehealth: Payer: Self-pay

## 2019-02-02 NOTE — Telephone Encounter (Signed)
LMOM FOR PRESCREEN  

## 2019-02-09 ENCOUNTER — Other Ambulatory Visit: Payer: Self-pay | Admitting: Family Medicine

## 2019-02-09 ENCOUNTER — Ambulatory Visit (INDEPENDENT_AMBULATORY_CARE_PROVIDER_SITE_OTHER): Payer: BC Managed Care – PPO | Admitting: Pharmacist Clinician (PhC)/ Clinical Pharmacy Specialist

## 2019-02-09 ENCOUNTER — Other Ambulatory Visit: Payer: Self-pay

## 2019-02-09 DIAGNOSIS — Z952 Presence of prosthetic heart valve: Secondary | ICD-10-CM | POA: Diagnosis not present

## 2019-02-09 DIAGNOSIS — Z7901 Long term (current) use of anticoagulants: Secondary | ICD-10-CM | POA: Diagnosis not present

## 2019-02-09 LAB — POCT INR: INR: 3.7 — AB (ref 2.0–3.0)

## 2019-03-08 ENCOUNTER — Other Ambulatory Visit: Payer: Self-pay

## 2019-03-08 ENCOUNTER — Ambulatory Visit: Payer: BC Managed Care – PPO | Admitting: Family Medicine

## 2019-03-08 ENCOUNTER — Encounter: Payer: Self-pay | Admitting: Family Medicine

## 2019-03-08 VITALS — BP 164/86 | HR 82 | Temp 97.8°F | Ht 63.0 in | Wt 171.9 lb

## 2019-03-08 DIAGNOSIS — E1169 Type 2 diabetes mellitus with other specified complication: Secondary | ICD-10-CM

## 2019-03-08 DIAGNOSIS — R457 State of emotional shock and stress, unspecified: Secondary | ICD-10-CM

## 2019-03-08 DIAGNOSIS — E1159 Type 2 diabetes mellitus with other circulatory complications: Secondary | ICD-10-CM | POA: Diagnosis not present

## 2019-03-08 DIAGNOSIS — E559 Vitamin D deficiency, unspecified: Secondary | ICD-10-CM | POA: Diagnosis not present

## 2019-03-08 DIAGNOSIS — I10 Essential (primary) hypertension: Secondary | ICD-10-CM

## 2019-03-08 DIAGNOSIS — E039 Hypothyroidism, unspecified: Secondary | ICD-10-CM | POA: Diagnosis not present

## 2019-03-08 DIAGNOSIS — E785 Hyperlipidemia, unspecified: Secondary | ICD-10-CM

## 2019-03-08 LAB — POCT GLYCOSYLATED HEMOGLOBIN (HGB A1C): Hemoglobin A1C: 6.9 % — AB (ref 4.0–5.6)

## 2019-03-08 NOTE — Progress Notes (Signed)
Impression and Recommendations:    1. Type 2 diabetes mellitus with other specified complication, without long-term current use of insulin ([place])   2. Hypertension associated with diabetes ([place])   3. Hyperlipidemia associated with type 2 diabetes mellitus ([place])   4. Hypothyroidism, unspecified type   5. Vitamin D insufficiency   6. White coat syndrome with diagnosis of hypertension   7. Caregiver stress syndrome     Type 2 diabetes mellitus with other specified complication, without long-term current use of insulin ([place]) - Plan: POCT glycosylated hemoglobin (Hb A1C), Hemoglobin A1c, CBC with Differential/Platelet, Comprehensive metabolic panel, Lipid panel, VITAMIN D 25 Hydroxy (Vit-D Deficiency, Fractures), TSH, T4, free, Vitamin B12, Magnesium,  Hypertension associated with diabetes ([place]) - Plan: Hemoglobin A1c, CBC with Differential/Platelet, Comprehensive metabolic panel, Lipid panel, VITAMIN D 25 Hydroxy (Vit-D Deficiency, Fractures), TSH, T4, free, Vitamin B12, Magnesium,  Hyperlipidemia associated with type 2 diabetes mellitus ([facility]) - Plan: Hemoglobin A1c, CBC with Differential/Platelet, Comprehensive metabolic panel, Lipid panel, VITAMIN D 25 Hydroxy (Vit-D Deficiency, Fractures), TSH, T4, free, Vitamin B12, Magnesium,  Hypothyroidism, unspecified type - Plan: Hemoglobin A1c, CBC with Differential/Platelet, Comprehensive metabolic panel, Lipid panel, VITAMIN D 25 Hydroxy (Vit-D Deficiency, Fractures), TSH, T4, free, Vitamin B12, Magnesium,  Vitamin D insufficiency - Plan: Hemoglobin A1c, CBC with Differential/Platelet, Comprehensive metabolic panel, Lipid panel, VITAMIN D 25 Hydroxy (Vit-D Deficiency, Fractures), TSH, T4, free, Vitamin B12, Magnesium,   White coat syndrome with diagnosis of hypertension - Plan: Hemoglobin A1c, CBC with Differential/Platelet, Comprehensive metabolic panel, Lipid panel, VITAMIN D 25 Hydroxy (Vit-D Deficiency, Fractures), TSH, T4, free, Vitamin  B12, Magnesium,   Caregiver stress syndrome - Plan:   - Extensively reviewed prudent practices during COVID.  Diabetes Mellitus - A1c stable at this time, 6.9 from 7.2 last check.  - Last visit, resumed Jardiance and iIncreased metformin from 1000 in the evening only, to 500 in the morning and 1000 of XR in the evening.  - Patient tolerating meds well, but notes that she has had more diarrhea since increasing her metformin.  - Discussed beginning an alternative injectable medicine if patient desires a change and if her diarrhea on the metformin is intolerable.  Patient declines changes at this time.  Per patient, would like to think about it for a while and see if her S-E become intolerable.  - Encouraged patient to take her metformin with food, and avoid eating foods that exacerbate her nausea or diarrhea.  - Continue treatment plan as prescribed.  See med list. - Patient will consider Ozempic in the future and re-check in 3 months.  - Counseled patient on pathophysiology of disease and discussed various treatment options, which often includes dietary and lifestyle modifications as first line.  Importance of low carb/ketogenic diet discussed with patient in addition to regular exercise.   - Encouraged patient to lose weight to help control blood sugar.  - Check FBS and 2 hours after the biggest meal of your day.  Keep log and bring in next OV for my review.   Also, if you ever feel poorly, please check your blood pressure and blood sugar, as one or the other could be the cause of your symptoms.  - Being a diabetic, you need yearly eye and foot exams. Make appt.for diabetic eye exam.  - Will continue to monitor.  White Coat Syndrome, with diagnosis of Hypertension associated with DM - Stable at this time per patient's BP logs. - Patient monitored by cardiology.  -  Continue treatment plan as recommended.  See med list below. - Patient tolerating meds well without complication.   Denies S-E  - Lifestyle changes such as dash diet and engaging in a regular exercise program discussed with patient.  Educational handouts provided  - Continued ambulatory BP monitoring encouraged. Keep log and bring in next OV.  - Will continue to monitor.  Hyperlipidemia Associated with Diabetes Mellitus - Stable at this time. - Treatment plan managed and monitored by cardiology.  - Continue treatment plan as established.  See med list below. - Patient tolerating meds well without complication.  Denies S-E  - Will continue to monitor.  Caregiver Stress Syndrome - Patient declined additional management today.   - Per patient, thinks she's doing well with current coping mechanisms.  - Reviewed the "spokes of the wheel" of mood and health management.  Stressed the importance of ongoing prudent habits, including regular exercise, appropriate sleep hygiene, healthful dietary habits, and prayer/meditation to relax.  - Encouraged patient to seek a therapist/life coach, engage in regular exercise, and other prudent mood management strategies.  Lifestyle & Preventative Health Maintenance - Advised patient to continue working toward exercising to improve overall mental, physical, and emotional health.    - Encouraged patient to engage in daily physical activity as tolerated, especially a formal exercise routine.  Recommended that the patient eventually strive for at least 150 minutes of moderate cardiovascular activity per week according to guidelines established by the [facility].   - Healthy dietary habits encouraged, including low-carb, and high amounts of lean protein in diet.   - Patient should also consume adequate amounts of water.  - Health counseling performed.  All questions answered.  Recommendations - Fasting lab work drawn today.  See orders.  - Discussed need for patient to continue to obtain management and screenings with all established specialists.  Educated patient at length  about the critical importance of keeping chronic health maintenance up to date.    Orders Placed This Encounter  Procedures  . Hemoglobin A1c  . CBC with Differential/Platelet  . Comprehensive metabolic panel  . Lipid panel  . VITAMIN D 25 Hydroxy (Vit-D Deficiency, Fractures)  . TSH  . T4, free  . Vitamin B12  . Magnesium  . POCT glycosylated hemoglobin (Hb A1C)    Gross side effects, risk and benefits, and alternatives of medications and treatment plan in general discussed with patient.  Patient is aware that all medications have potential side effects and we are unable to predict every side effect or drug-drug interaction that may occur.   Patient will call with any questions prior to using medication if they have concerns.    Expresses verbal understanding and consents to current therapy and treatment regimen.  No barriers to understanding were identified.  Red flag symptoms and signs discussed in detail.  Patient expressed understanding regarding what to do in case of emergency\urgent symptoms  Please see AVS handed out to patient at the end of our visit for further patient instructions/ counseling done pertaining to today's office visit.   Return for 3-4 mo for DM, thyroid mgt, mood check etc..     Note:  This note was prepared with assistance of Dragon voice recognition software. Occasional wrong-word or sound-a-like substitutions may have occurred due to the inherent limitations of voice recognition software.   This document serves as a record of services personally performed by Mellody Dance, DO. It was created on her behalf by Toni Amend, a trained medical scribe. The  creation of this record is based on the scribe's personal observations and the provider's statements to them.   I have reviewed the above medical documentation for accuracy and completeness and I concur.  Mellody Dance, DO 03/14/2019 10:19 PM         --------------------------------------------------------------------------------------------------------------------------------------------------------------------------------------------------------------------------------------------    Subjective:     HPI: Katrina Obrien is a 63 y.o. female who presents to [place] at [facility] today for issues as discussed below.  Always wears a mask and avoids other people, practicing social distancing.  Denies SOB, chest pain, dizziness, or any new concerning symptoms.  Mood Management Denies concerns about her mood, though she is dealing with several personal concerns.  Notes that her daughters help her cope.  Says when she gets down, she has several coping mechanisms that help her feel better, most of which involve taking care of her daughters and her family at large, and concerning herself with their problems instead of her own.  Declines need for treatment or mood management today.  Notes her husband has been having some issues coping over the past several months, but she has her daughter's pregnancy to look forward to.  Agrees that she needs to talk to a therapist in the future about her concerns about her husband.  Says she has realized she cannot control her husband's mood and "if he doesn't want to get up, that's fine; he can stay right there."  DM HPI: Only follows up here for her diabetes.  -  She has not been working on diet and exercise for diabetes.  Says she doesn't eat the way she should since she's stuck in the house.  Notes "getting into bags of cookies" and such.  Says she needs to get back to work because "I've been on the couch way too much."  Says she's not exercising, but is going back to work in September.  Says she is ready to get back to something "more normal."  Medication compliance - Patient went off Jardiance prior.  Last visit, iIncreased metformin from 1000 in the evening only, to 500 in the morning  and 1000 of XR in the evening.  Per patient, has been compliant with her treatment plan.  Notes she does have diarrhea more often since starting the metformin and while it is tolerable now, she is afraid it might be less tolerable once she resumes work.   Home fasting glucose readings: 137 this morning;  2 hr PP: not checking appropriately.  States she checks her blood sugar several times per day, "to make sure where I should be."  Says she feels like she has it under control.   Denies polyuria/polydipsia.  Denies hypo/ hyperglycemia symptoms  Last diabetic eye exam was  Lab Results  Component Value Date   HMDIABEYEEXA No Retinopathy 07/06/2018    Last A1C in the office was:  Lab Results  Component Value Date   HGBA1C 6.9 (H) 03/08/2019   HGBA1C 6.9 (A) 03/08/2019   HGBA1C 7.2 (A) 09/13/2018    Lab Results  Component Value Date   LDLCALC 47 03/08/2019   CREATININE 0.61 03/08/2019    Wt Readings from Last 3 Encounters:  03/08/19 171 lb 14.4 oz (78 kg)  12/14/18 162 lb 8 oz (73.7 kg)  09/16/18 159 lb 12.8 oz (72.5 kg)    BP Readings from Last 3 Encounters:  03/08/19 (!) 164/86  12/14/18 (!) 112/57  09/27/18 (!) 161/74    1. HTN HPI:  -  Her blood pressure has been controlled at home.  Pt is regularly checking it at home. Was 137/71 before she left the house.  States that this is around her average.  - Patient reports good compliance with blood pressure medications.  Continues taking them every day.  - Denies medication S-E   - Smoking Status noted   - She denies new onset of: chest pain, exercise intolerance, shortness of breath, dizziness, visual changes, headache, lower extremity swelling or claudication.   Last 3 blood pressure readings in our office are as follows: BP Readings from Last 3 Encounters:  03/08/19 (!) 164/86  12/14/18 (!) 112/57  09/27/18 (!) 161/74    Filed Weights   03/08/19 0841  Weight: 171 lb 14.4 oz (78 kg)    2. 63 y.o.  female here for cholesterol follow-up.   - Patient reports good compliance with medications or treatment plan.  Managed by cardiology.  Notes having some issues with insurance and medications which she is working through with her specialist.  - Denies medication S-E   - Smoking Status noted   - She denies new onset of: chest pain, exercise intolerance, shortness of breath, dizziness, visual changes, headache, lower extremity swelling or claudication.   Denies myalgias  The cholesterol last visit was:  Lab Results  Component Value Date   CHOL 128 03/08/2019   HDL 48 03/08/2019   LDLCALC 47 03/08/2019   TRIG 165 (H) 03/08/2019   CHOLHDL 2.7 03/08/2019    Hepatic Function Latest Ref Rng & Units 03/08/2019 09/15/2018 02/08/2018  Total Protein 6.0 - 8.5 g/dL 6.9 6.9 7.7  Albumin 3.8 - 4.8 g/dL 4.9(H) 4.7 5.0(H)  AST 0 - 40 IU/L _0 ALT 0 - 32 IU/L 34(H) 28 36(H)  Alk Phosphatase 39 - 117 IU/L 52 63 59  Total Bilirubin 0.0 - 1.2 mg/dL 0.4 0.3 0.6     Wt Readings from Last 3 Encounters:  03/08/19 171 lb 14.4 oz (78 kg)  12/14/18 162 lb 8 oz (73.7 kg)  09/16/18 159 lb 12.8 oz (72.5 kg)   BP Readings from Last 3 Encounters:  03/08/19 (!) 164/86  12/14/18 (!) 112/57  09/27/18 (!) 161/74   Pulse Readings from Last 3 Encounters:  03/08/19 82  12/14/18 71  09/27/18 85   BMI Readings from Last 3 Encounters:  03/08/19 30.45 kg/m  12/14/18 28.79 kg/m  09/16/18 28.31 kg/m     Patient Care Team    Relationship Specialty Notifications Start End  Mellody Dance, DO PCP - General Family Medicine  01/05/19   Pixie Casino, MD PCP - Cardiology Cardiology Admissions 03/05/18      Patient Active Problem List   Diagnosis Date Noted  . Hyperlipidemia associated with type 2 diabetes mellitus ([place]) 02/03/2018    Priority: High  . 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 DM2 (diabetes mellitus, type 2) ([place]) 01/26/2013    Priority: High  . Hypertension associated  with diabetes ([place]) 01/26/2013    Priority: High  . Hypothyroidism 02/03/2018    Priority: Medium  . 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 DVT (deep venous thrombosis) ([place]) 01/26/2013    Priority: Medium  . Pulmonary embolus ([place]) 01/26/2013    Priority: Medium  . Vitamin D insufficiency 09/13/2018    Priority: Low  . S/P AVR 01/26/2013    Priority: Low  . Long term (current) use of anticoagulants 10/11/2012  Priority: Low  . Caregiver stress syndrome 03/08/2019  . White coat syndrome with diagnosis of hypertension 09/13/2018  . Acute maxillary sinusitis 04/19/2018  . LBBB (left bundle branch block) 04/06/2014  . Aortic stenosis 01/26/2013  . 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 Cardiomyopathy, ischemic 01/26/2013    Past Medical history, Surgical history, Family history, Social history, Allergies and Medications have been entered into the medical record, reviewed and changed as needed.    Current Meds  Medication Sig  . aspirin EC 81 MG tablet Take 81 mg by mouth daily.  . Evolocumab (REPATHA SURECLICK) 794 MG/ML SOAJ Inject 140 mg into the skin every 14 (fourteen) days.  . fluticasone (FLONASE) 50 MCG/ACT nasal spray Place 2 sprays into both nostrils daily.  . furosemide (LASIX) 40 MG tablet TAKE 1/2 TABLET BY MOUTH DAILY.  Marland Kitchen JARDIANCE 25 MG TABS tablet TAKE 1 TABLET BY MOUTH ONCE A DAY  . levothyroxine (SYNTHROID, LEVOTHROID) 50 MCG tablet Take 1 tablet (50 mcg total) by mouth daily before breakfast.  . metFORMIN (GLUCOPHAGE XR) 500 MG 24 hr tablet 1 tab in am and 2 tabs nightly  . metoprolol succinate (TOPROL-XL) 100 MG 24 hr tablet TAKE 1 TABLET BY MOUTH DAILY. TAKE WITH OR IMMEDIATELY FOLLOWING A MEAL.  . ramipril (ALTACE) 5 MG capsule TAKE 1 CAPSULE BY MOUTH DAILY.  . rosuvastatin (CRESTOR) 40 MG tablet TAKE 1 TABLET BY MOUTH DAILY.  Marland Kitchen warfarin (COUMADIN) 2 MG tablet TAKE 1 TO 1 AND 1/2 TABLETS BY MOUTH DAILY AS DIRECTED BY  C[facility]MADIN CLINIC.    Allergies:  Allergies  Allergen Reactions  . Niaspan [Niacin Er] Other (See Comments)    Flushing, even with ASA     Review of Systems:  A fourteen system review of systems was performed and found to be positive as per HPI.   Objective:   Blood pressure (!) 164/86, pulse 82, temperature 97.8 F (36.6 C), height _0  (1.6 m), weight 171 lb 14.4 oz (78 kg), SpO2 99 %. Body mass index is 30.45 kg/m. General:  Well Developed, well nourished, appropriate for stated age.  Neuro:  Alert and oriented,  extra-ocular muscles intact  HEENT:  Normocephalic, atraumatic, neck supple, no carotid bruits appreciated  Skin:  no gross rash, warm, pink. Cardiac:  RRR, S1 S2 Respiratory:  ECTA B/L and A/P, Not using accessory muscles, speaking in full sentences- unlabored. Vascular:  Ext warm, no cyanosis apprec.; cap RF less 2 sec. Psych:  No HI/SI, judgement and insight good, Euthymic mood. Full Affect.

## 2019-03-08 NOTE — Patient Instructions (Signed)

## 2019-03-09 ENCOUNTER — Ambulatory Visit (INDEPENDENT_AMBULATORY_CARE_PROVIDER_SITE_OTHER): Payer: BC Managed Care – PPO | Admitting: Pharmacist Clinician (PhC)/ Clinical Pharmacy Specialist

## 2019-03-09 DIAGNOSIS — Z7901 Long term (current) use of anticoagulants: Secondary | ICD-10-CM | POA: Diagnosis not present

## 2019-03-09 DIAGNOSIS — Z952 Presence of prosthetic heart valve: Secondary | ICD-10-CM

## 2019-03-09 LAB — CBC WITH DIFFERENTIAL/PLATELET
Basophils Absolute: 0 10*3/uL (ref 0.0–0.2)
Basos: 1 %
EOS (ABSOLUTE): 0.2 10*3/uL (ref 0.0–0.4)
Eos: 3 %
Hematocrit: 40.3 % (ref 34.0–46.6)
Hemoglobin: 13.5 g/dL (ref 11.1–15.9)
Immature Grans (Abs): 0 10*3/uL (ref 0.0–0.1)
Immature Granulocytes: 0 %
Lymphocytes Absolute: 2.5 10*3/uL (ref 0.7–3.1)
Lymphs: 42 %
MCH: 29.7 pg (ref 26.6–33.0)
MCHC: 33.5 g/dL (ref 31.5–35.7)
MCV: 89 fL (ref 79–97)
Monocytes Absolute: 0.5 10*3/uL (ref 0.1–0.9)
Monocytes: 8 %
Neutrophils Absolute: 2.8 10*3/uL (ref 1.4–7.0)
Neutrophils: 46 %
Platelets: 238 10*3/uL (ref 150–450)
RBC: 4.54 x10E6/uL (ref 3.77–5.28)
RDW: 13.5 % (ref 11.7–15.4)
WBC: 5.9 10*3/uL (ref 3.4–10.8)

## 2019-03-09 LAB — COMPREHENSIVE METABOLIC PANEL
ALT: 34 IU/L — ABNORMAL HIGH (ref 0–32)
AST: 22 IU/L (ref 0–40)
Albumin/Globulin Ratio: 2.5 — ABNORMAL HIGH (ref 1.2–2.2)
Albumin: 4.9 g/dL — ABNORMAL HIGH (ref 3.8–4.8)
Alkaline Phosphatase: 52 IU/L (ref 39–117)
BUN/Creatinine Ratio: 39 — ABNORMAL HIGH (ref 12–28)
BUN: 24 mg/dL (ref 8–27)
Bilirubin Total: 0.4 mg/dL (ref 0.0–1.2)
CO2: 23 mmol/L (ref 20–29)
Calcium: 9.5 mg/dL (ref 8.7–10.3)
Chloride: 100 mmol/L (ref 96–106)
Creatinine, Ser: 0.61 mg/dL (ref 0.57–1.00)
GFR calc Af Amer: 112 mL/min/{1.73_m2} (ref >59)
GFR calc non Af Amer: 97 mL/min/{1.73_m2} (ref >59)
Globulin, Total: 2 g/dL (ref 1.5–4.5)
Glucose: 145 mg/dL — ABNORMAL HIGH (ref 65–99)
Potassium: 4 mmol/L (ref 3.5–5.2)
Sodium: 140 mmol/L (ref 134–144)
Total Protein: 6.9 g/dL (ref 6.0–8.5)

## 2019-03-09 LAB — POCT INR: INR: 3.4 — AB (ref 2.0–3.0)

## 2019-03-09 LAB — HEMOGLOBIN A1C
Est. average glucose Bld gHb Est-mCnc: 151 mg/dL
Hgb A1c MFr Bld: 6.9 % — ABNORMAL HIGH (ref 4.8–5.6)

## 2019-03-09 LAB — T4, FREE: Free T4: 1.43 ng/dL (ref 0.82–1.77)

## 2019-03-09 LAB — VITAMIN B12: Vitamin B-12: 420 pg/mL (ref 232–1245)

## 2019-03-09 LAB — TSH: TSH: 1.53 u[IU]/mL (ref 0.450–4.500)

## 2019-03-09 LAB — LIPID PANEL
Chol/HDL Ratio: 2.7 ratio (ref 0.0–4.4)
Cholesterol, Total: 128 mg/dL (ref 100–199)
HDL: 48 mg/dL (ref >39)
LDL Calculated: 47 mg/dL (ref 0–99)
Triglycerides: 165 mg/dL — ABNORMAL HIGH (ref 0–149)
VLDL Cholesterol Cal: 33 mg/dL (ref 5–40)

## 2019-03-09 LAB — VITAMIN D 25 HYDROXY (VIT D DEFICIENCY, FRACTURES): Vit D, 25-Hydroxy: 55.6 ng/mL (ref 30.0–100.0)

## 2019-03-09 LAB — MAGNESIUM: Magnesium: 2 mg/dL (ref 1.6–2.3)

## 2019-03-23 ENCOUNTER — Telehealth: Payer: Self-pay

## 2019-03-23 MED ORDER — PRALUENT 150 MG/ML ~~LOC~~ SOAJ: 1.0000 | SUBCUTANEOUS | 11 refills | Status: DC

## 2019-03-23 NOTE — Telephone Encounter (Signed)
lmom to move appt since hilty's is now virtual

## 2019-03-23 NOTE — Telephone Encounter (Signed)
Called patient about her virtual visit 9/4 - she agreed to proceed. She will need her in-office INR check r/s - message sent to [facility]. Patient's PCSK9 changed from Repatha 140mg /mL to Praluent 150mg /mL d/t insurance coverage.

## 2019-04-01 ENCOUNTER — Telehealth (INDEPENDENT_AMBULATORY_CARE_PROVIDER_SITE_OTHER): Payer: BC Managed Care – PPO | Admitting: Internal Medicine

## 2019-04-01 ENCOUNTER — Encounter: Payer: Self-pay | Admitting: Internal Medicine

## 2019-04-01 VITALS — BP 120/58 | HR 76 | Ht 63.0 in | Wt 164.0 lb

## 2019-04-01 DIAGNOSIS — E7849 Other hyperlipidemia: Secondary | ICD-10-CM

## 2019-04-01 DIAGNOSIS — Z951 Presence of aortocoronary bypass graft: Secondary | ICD-10-CM

## 2019-04-01 DIAGNOSIS — Z952 Presence of prosthetic heart valve: Secondary | ICD-10-CM

## 2019-04-01 DIAGNOSIS — I447 Left bundle-branch block, unspecified: Secondary | ICD-10-CM

## 2019-04-01 DIAGNOSIS — E1100 Type 2 diabetes mellitus with hyperosmolarity without nonketotic hyperglycemic-hyperosmolar coma (NKHHC): Secondary | ICD-10-CM

## 2019-04-01 DIAGNOSIS — I255 Ischemic cardiomyopathy: Secondary | ICD-10-CM

## 2019-04-01 DIAGNOSIS — E785 Hyperlipidemia, unspecified: Secondary | ICD-10-CM

## 2019-04-01 DIAGNOSIS — E1169 Type 2 diabetes mellitus with other specified complication: Secondary | ICD-10-CM | POA: Diagnosis not present

## 2019-04-01 DIAGNOSIS — Z7901 Long term (current) use of anticoagulants: Secondary | ICD-10-CM

## 2019-04-01 MED ORDER — PRALUENT 150 MG/ML ~~LOC~~ SOAJ: 1.0000 | SUBCUTANEOUS | 11 refills | Status: DC

## 2019-04-01 NOTE — Patient Instructions (Signed)
Medication Instructions:  Your physician recommends that you continue on your current medications as directed. Please refer to the Current Medication list given to you today.  If you need a refill on your cardiac medications before your next appointment, please call your pharmacy.   Follow-Up: At [facility], you and your health needs are our priority.  As part of our continuing mission to provide you with exceptional heart care, we have created designated Provider Care Teams.  These Care Teams include your primary Cardiologist (physician) and Advanced Practice Providers (APPs -  Physician Assistants and Nurse Practitioners) who all work together to provide you with the care you need, when you need it. You will need a follow up appointment in 6 months.  Please call our office 2 months in advance to schedule this appointment.  You may see Kenneth C Hilty, MD or one of the following Advanced Practice Providers on your designated Care Team: Hao Meng, PA-C . Angela Duke, PA-C  Any Other Special Instructions Will Be Listed Below (If Applicable).    

## 2019-04-01 NOTE — Progress Notes (Signed)
Virtual Visit via Video Note   This visit type was conducted due to national recommendations for restrictions regarding the COVID-19 Pandemic (e.g. social distancing) in an effort to limit this patient's exposure and mitigate transmission in our community.  Due to her co-morbid illnesses, this patient is at least at moderate risk for complications without adequate follow up.  This format is felt to be most appropriate for this patient at this time.  All issues noted in this document were discussed and addressed.  A limited physical exam was performed with this format.  Please refer to the patient's chart for her consent to telehealth for [facility].   Evaluation Performed:  Doxy[employer]me video visit  Date:  04/01/2019   ID:  Obrien, Katrina [birth year], MRN [id number]  Patient Location:  [address] [place] [address]  Provider location:   [address], [address] [place], [place] [address]  PCP:  Thomasene Lot, DO  Cardiologist:  Chrystie Nose, MD Electrophysiologist:  None   Chief Complaint:  No complaints  History of Present Illness:    Katrina Obrien is a 63 y.o. female who presents via audio/video conferencing for a telehealth visit today.  Katrina Obrien is seen today in follow-up.  She continues to do very well with aggressive medical therapy.  She has familial hyperlipidemia and has done well on Repatha.  Unfortunately had a history of three-vessel coronary disease, PAD with an ischemic leg, severe aortic stenosis status post AVR with mechanical valve, history of DVT and PE, hypertension and dyslipidemia.  Recently her INRs have been slightly supratherapeutic however they have improved somewhat.  She had repeat labs in August showing total cholesterol 128, triglycerides 165, HDL 48 and LDL 47.  Overall very good lipid profile the triglycerides are somewhat higher.  Hemoglobin A1c was 6.9 which have been a little over 7 recently but is coming down.  Most of this is  attributed to her increase in eating and sedentary lifestyle during COVID.  She has gained about 5 pounds since I last saw her.  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  The patient does not have symptoms concerning for COVID-19 infection (fever, chills, cough, or new SHORTNESS OF BREATH).    Prior CV studies:   The following studies were reviewed today:  Chart reviewed Lab work  PMHx:  Past Medical History:  Diagnosis Date  . 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 Clotting disorder (HCC)    anticoagulant - coumadin hx DVT, PE  . Coronary artery disease    s/p 3- vessel bypass   . Diabetes mellitus without complication (HCC)   . DVT (deep venous thrombosis) (HCC)   . Heart murmur   . Hyperlipidemia   . Hypertension   . Ischemic cardiomyopathy    40 to 45%  . Left bundle branch block   . Mechanical heart valve present    Jan 2012  . Popliteal artery occlusion, left (HCC)    recent  status  post angioplasty  . Pulmonary embolism (HCC)    associated with DVT  . Pulmonary embolus (HCC)   . Thyroid disease    hyperthyroidism    Past Surgical History:  Procedure Laterality Date  . AORTIC VALVE REPLACEMENT (AVR)/CORONARY ARTERY BYPASS GRAFTING (CABG)  08/16/2010  VAN TRIGT   AV replacement w/19-mm mechanical St Jude valve ([id number]),CABGX3  lima to LAD, SVG to  distal circ,SVG to posterior descending  . ASD REPAIR    . CARDIAC CATHETERIZATION  08/08/2010   ,LAD 50% PROX,605 TANDEM SEGMENTAL STENOSIS,LEFT CIRC 60%-AV GROOVE, RGT COR 40% PROX ON THE BEND ,80% PROX PDA,  . CORONARY ARTERY BYPASS GRAFT  08/16/2010   LIMA TO lad,svg to distal circ ,svg to posterior descending  . DOPPLER ECHOCARDIOGRAPHY  08/09/2010   EF35% to 40% ,bbicuspid ;severely  thickened ,severely calcified leaflets, LV normal  . LEA doppler  08/07/2010   left popliteal occlusion ,and anterior tibial complete  occlusion  . NM MYOCAR PERF WALL MOTION  08/06/2010   EF 31%  LOW RISK SCAN  . nuc    . PV ANGIOGRAM  08/08/2010   successful PTA LEFT POPLITEAL    FAMHx:  Family History  Problem Relation Age of Onset  . Cancer Mother   . Cancer Father   . CAD Father   . Heart attack Father 86       first MI, 2 major after that, multiple silent MIs  . Stroke Brother   . CAD Brother 48       first stent  . Heart attack Brother   . CAD Brother 74       CABG   . Hypertension Brother   . Heart attack Brother 45       died from MI  . Colon cancer Neg Hx   . Esophageal cancer Neg Hx   . Rectal cancer Neg Hx   . Stomach cancer Neg Hx     SOCHx:   reports that she has never smoked. She has never used smokeless tobacco. She reports current alcohol use of about 7.0 standard drinks of alcohol per week. She reports that she does not use drugs.  ALLERGIES:  Allergies  Allergen Reactions  . Niaspan [Niacin Er] Other (See Comments)    Flushing, even with ASA    MEDS:  Current Meds  Medication Sig  . Alirocumab (PRALUENT) 150 MG/ML SOAJ Inject 1 Dose into the skin every 14 (fourteen) days.  Marland Kitchen aspirin EC 81 MG tablet Take 81 mg by mouth daily.  . fluticasone (FLONASE) 50 MCG/ACT nasal spray Place 2 sprays into both nostrils daily.  . furosemide (LASIX) 40 MG tablet TAKE 1/2 TABLET BY MOUTH DAILY.  Marland Kitchen JARDIANCE 25 MG TABS tablet TAKE 1 TABLET BY MOUTH ONCE A DAY  . levothyroxine (SYNTHROID, LEVOTHROID) 50 MCG tablet Take 1 tablet (50 mcg total) by mouth daily before breakfast.  . metFORMIN (GLUCOPHAGE XR) 500 MG 24 hr tablet 1 tab in am and 2 tabs nightly  . metoprolol succinate (TOPROL-XL) 100 MG 24 hr tablet TAKE 1 TABLET BY MOUTH DAILY. TAKE WITH OR IMMEDIATELY FOLLOWING A MEAL.  . ramipril (ALTACE) 5 MG capsule TAKE 1 CAPSULE BY MOUTH DAILY.  . rosuvastatin (CRESTOR) 40 MG tablet TAKE 1 TABLET BY MOUTH DAILY.  Marland Kitchen warfarin (COUMADIN) 2 MG tablet TAKE 1 TO 1 AND 1/2 TABLETS BY MOUTH DAILY AS  DIRECTED BY COUMADIN CLINIC.  . [DISCONTINUED] Alirocumab (PRALUENT) 150 MG/ML SOAJ Inject 1 Dose into the skin every 14 (fourteen) days.     ROS: Pertinent items noted in HPI and remainder of comprehensive ROS otherwise negative.  Labs/Other Tests and Data Reviewed:    Recent Labs: 03/08/2019: ALT 34; BUN 24; Creatinine, Ser 0.61; Hemoglobin 13.5; Magnesium 2.0; Platelets 238; Potassium 4.0; Sodium 140; TSH 1.530   Recent Lipid Panel Lab Results  Component Value Date/Time   CHOL 128 03/08/2019 09:34  AM   CHOL 80 (L) 11/27/2014 07:07 AM   TRIG 165 (H) 03/08/2019 09:34 AM   TRIG 108 11/27/2014 07:07 AM   HDL 48 03/08/2019 09:34 AM   HDL 38 (L) 11/27/2014 07:07 AM   CHOLHDL 2.7 03/08/2019 09:34 AM   CHOLHDL 2.2 06/18/2016 08:01 AM   LDLCALC 47 03/08/2019 09:34 AM   LDLCALC 20 11/27/2014 07:07 AM    Wt Readings from Last 3 Encounters:  04/01/19 164 lb (74.4 kg)  03/08/19 171 lb 14.4 oz (78 kg)  12/14/18 162 lb 8 oz (73.7 kg)     Exam:    Vital Signs:  BP (!) 120/58   Pulse 76   Ht 5\' 3"  (1.6 m)   Wt 164 lb (74.4 kg)   LMP  (LMP Unknown)   BMI 29.05 kg/m    General appearance: alert and no distress Lungs: No audible respiratory difficulty Abdomen: Mildly overweight Extremities: extremities normal, atraumatic, no cyanosis or edema Skin: Skin color, texture, turgor normal. No rashes or lesions Neurologic: Mental status: Alert, oriented, thought content appropriate Psych: Pleasant  ASSESSMENT & PLAN:    1. Coronary artery disease status post 3 vessel CABG (LIMA to LAD, SVG to circumflex, SVG to PDA) - 07/2010 2. Severe aortic stenosis and a bicuspid aortic valve, status post 19 mm St. Jude mechanical aortic valve excellent recent left popliteal artery occlusion with rest pain status post angioplasty (2012) 3. History of pulmonary emboli associated with DVT 4. Ischemic cardiomyopathy, EF now normalized at 55-60% 5. Hypertension - at goal 6. Familial hyperlipidemia -  LDL>190 on Repatha 7. Diabetes type 2 - controlled 8. LBBB   Keshawnna continues to do well on medical therapy.  Now she is about 8 years out from multiple medical problems which seem to be stable.  She had had a repeat echo in December which showed low normal LVEF 50 to 55% with normally functioning mechanical aortic valve.  Her INRs have been slightly supratherapeutic however are trending down towards normal.  There is been about a 5 pound weight gain slightly increased hemoglobin A1c which I also think contributes to her mild elevation in triglycerides.  Overall LDL is at target and will less than 70.  Plan continued current medications, follow-up with me in 6 months or sooner as necessary.  COVID-19 Education: The signs and symptoms of COVID-19 were discussed with the patient and how to seek care for testing (follow up with PCP or arrange E-visit).  The importance of social distancing was discussed today.  Patient Risk:   After full review of this patients clinical status, I feel that they are at least moderate risk at this time.  Time:   Today, I have spent 25 minutes with the patient with telehealth technology discussing dyslipidemia, anticoagulation, aortic valve disease, hypertension, type 2 diabetes.     Medication Adjustments/Labs and Tests Ordered: Current medicines are reviewed at length with the patient today.  Concerns regarding medicines are outlined above.   Tests Ordered: No orders of the defined types were placed in this encounter.   Medication Changes: Meds ordered this encounter  Medications  . Alirocumab (PRALUENT) 150 MG/ML SOAJ    Sig: Inject 1 Dose into the skin every 14 (fourteen) days.    Dispense:  6 pen    Refill:  11    Change from Repatha - insurance will no longer cover. Please HOLD on file until patient requests refill. Thanks    Disposition:  in 6 month(s)  Lisette Abu.  Rennis Golden, MD, [facility], FACP  [place]  [facility]  Medical Director of the  [facility] Lipid [facility]  Card[facility] Reduction [facility] Diplomate of the American Board of Clinical Lipidology Attending Cardiologist  Direct Dial: [phone]  Fax: [phone]  Website:  [email or web address]  Chrystie Nose, MD  04/01/2019 8:10 AM

## 2019-04-05 NOTE — Progress Notes (Signed)
I'm sorry Tonya as I cannot see the details of this message and if I need to act on it.   From what I can see on my epic app on my phone, it appears it's just an [place] message.    If this is not the case, please let me know. Thank you so much.

## 2019-04-06 ENCOUNTER — Ambulatory Visit (INDEPENDENT_AMBULATORY_CARE_PROVIDER_SITE_OTHER): Payer: BC Managed Care – PPO | Admitting: Pharmacist

## 2019-04-06 ENCOUNTER — Other Ambulatory Visit: Payer: Self-pay

## 2019-04-06 DIAGNOSIS — Z7901 Long term (current) use of anticoagulants: Secondary | ICD-10-CM

## 2019-04-06 DIAGNOSIS — Z952 Presence of prosthetic heart valve: Secondary | ICD-10-CM

## 2019-04-06 LAB — POCT INR: INR: 3.4 — AB (ref 2.0–3.0)

## 2019-04-20 ENCOUNTER — Other Ambulatory Visit: Payer: Self-pay | Admitting: Family Medicine

## 2019-04-20 ENCOUNTER — Other Ambulatory Visit: Payer: Self-pay | Admitting: Internal Medicine

## 2019-04-20 DIAGNOSIS — Z1231 Encounter for screening mammogram for malignant neoplasm of breast: Secondary | ICD-10-CM

## 2019-04-20 DIAGNOSIS — E1169 Type 2 diabetes mellitus with other specified complication: Secondary | ICD-10-CM

## 2019-04-22 ENCOUNTER — Other Ambulatory Visit: Payer: Self-pay

## 2019-04-22 ENCOUNTER — Ambulatory Visit
Admission: RE | Admit: 2019-04-22 | Discharge: 2019-04-22 | Disposition: A | Discharge status: Home / Self Care | Payer: BC Managed Care – PPO | Source: Ambulatory Visit | Attending: Family Medicine | Admitting: Family Medicine

## 2019-04-22 DIAGNOSIS — Z1231 Encounter for screening mammogram for malignant neoplasm of breast: Secondary | ICD-10-CM

## 2019-05-11 ENCOUNTER — Other Ambulatory Visit: Payer: Self-pay

## 2019-05-11 ENCOUNTER — Ambulatory Visit (INDEPENDENT_AMBULATORY_CARE_PROVIDER_SITE_OTHER): Payer: BC Managed Care – PPO | Admitting: Pharmacist

## 2019-05-11 DIAGNOSIS — Z7901 Long term (current) use of anticoagulants: Secondary | ICD-10-CM

## 2019-05-11 DIAGNOSIS — Z952 Presence of prosthetic heart valve: Secondary | ICD-10-CM | POA: Diagnosis not present

## 2019-05-11 LAB — POCT INR: INR: 3 (ref 2.0–3.0)

## 2019-06-08 ENCOUNTER — Other Ambulatory Visit: Payer: Self-pay

## 2019-06-08 ENCOUNTER — Encounter: Payer: Self-pay | Admitting: Family Medicine

## 2019-06-08 ENCOUNTER — Ambulatory Visit (INDEPENDENT_AMBULATORY_CARE_PROVIDER_SITE_OTHER): Payer: BC Managed Care – PPO | Admitting: Family Medicine

## 2019-06-08 VITALS — BP 132/58 | Temp 96.6°F | Ht 63.0 in | Wt 165.0 lb

## 2019-06-08 DIAGNOSIS — E559 Vitamin D deficiency, unspecified: Secondary | ICD-10-CM | POA: Diagnosis not present

## 2019-06-08 DIAGNOSIS — I152 Hypertension secondary to endocrine disorders: Secondary | ICD-10-CM

## 2019-06-08 DIAGNOSIS — E039 Hypothyroidism, unspecified: Secondary | ICD-10-CM

## 2019-06-08 DIAGNOSIS — K521 Toxic gastroenteritis and colitis: Secondary | ICD-10-CM | POA: Insufficient documentation

## 2019-06-08 DIAGNOSIS — E1169 Type 2 diabetes mellitus with other specified complication: Secondary | ICD-10-CM

## 2019-06-08 DIAGNOSIS — E1159 Type 2 diabetes mellitus with other circulatory complications: Secondary | ICD-10-CM | POA: Diagnosis not present

## 2019-06-08 DIAGNOSIS — I10 Essential (primary) hypertension: Secondary | ICD-10-CM

## 2019-06-08 DIAGNOSIS — E785 Hyperlipidemia, unspecified: Secondary | ICD-10-CM

## 2019-06-08 MED ORDER — METFORMIN HCL ER 500 MG PO TB24: 1000.0000 mg | ORAL_TABLET | Freq: Every day | ORAL | 0 refills | Status: DC

## 2019-06-08 NOTE — Progress Notes (Signed)
Virtual / live video office visit note for [employer], D.O- Primary Care Physician at [facility]   I connected with current patient today and beyond visually recognizing the correct individual, I verified that I am speaking with the correct person using two identifiers.  . Location of the patient: Home . Location of the provider: Office Only the patient (+/- their family members at pt's discretion) and myself were participating in the encounter    - This visit type was conducted due to national recommendations for restrictions regarding the COVID-19 Pandemic (e.g. social distancing) in an effort to limit this patient's exposure and mitigate transmission in our community.  This format is felt to be most appropriate for this patient at this time.   - The patient did have access to video technology today  - No physical exam could be performed with this format, beyond that communicated to [place] by the patient/ family members as noted.   - Additionally my office staff/ schedulers discussed with the patient that there may be a monetary charge related to this service, depending on patient's medical insurance.   The patient expressed understanding, and agreed to proceed.      History of Present Illness:  I, Toni Amend, am serving as scribe for Dr. Mellody Dance.   Says "doing well, I hope."   Has a new grandchild; notes "he's healthy and he's beautiful."  Back to school; working a half day.  Notes "very very busy" because they are cleaning and moving furniture constantly.  "When I get home, I am out, I am exhausted, but I don't think that's because of my thyroid."   - Hyothyroidism Feels her thyroid is fine.  Notes "I am very tired right now," but thinks this is due to work, not thyroid.   - Recent Fall Says Halloween night she fell down her stairs, but thinks she's fine now.  Says she slid and hit her hip twice.  She did not lose consciousness.  She was able to get up  and walk afterward; "it took me a little bit."  Says it had been a long time since she fell down the stairs.  She took it easy for several days and "yes it was very very sore, but it feels fine now."   HPI:   Diabetes Mellitus:  Home glucose readings:  160's after fasting.  Notes "During the day they go down." Says "very seldom have one in the 140's now, they're in the 160's."   Says her sugars have been a little bit high.  "I'm not sure what we need to do."  Notes "one time last time, we talked about a new medicine, and I kept putting it off; but we may need to do something."  She wonders if increasing the Jardiance is a viable option.  Says "it seems like no matter what I eat or do, it still runs a little bit high."  Notes her sugars may be up because "COVID happened, and sitting around, not doing as much exercise."  Says she knows she is not getting the regular exercise she needs to be getting.  Thinks she has gained anywhere from 5-10 lbs.  She thinks the diarrhea is due to her increase in metformin dosage.  Notes "I was doing okay with the ones at night, but when I started adding another in the morning, I started having problems again."  "When I first started the [place], it was like a miracle  drug."  Also having some diarrhea problems on the metformin in the morning, which "is not working at school."  - Patient reports good compliance with therapy plan: medication and/or lifestyle modification.  Says she has used home A1c tests in the past.  - Her denies acute concerns or problems related to treatment plan  - She denies new concerns.  Denies polyuria/polydipsia, hypo/ hyperglycemia symptoms.  Denies new onset of: chest pain, exercise intolerance, shortness of breath, dizziness, visual changes, headache, lower extremity swelling or claudication.   Last A1C in the office was:  Lab Results  Component Value Date   HGBA1C 6.9 (H) 03/08/2019   HGBA1C 6.9 (A) 03/08/2019   HGBA1C 7.2  (A) 09/13/2018   Lab Results  Component Value Date   LDLCALC 47 03/08/2019   CREATININE 0.61 03/08/2019   BP Readings from Last 3 Encounters:  06/08/19 (!) 132/58  04/01/19 (!) 120/58  03/08/19 (!) 164/86   Wt Readings from Last 3 Encounters:  06/08/19 165 lb (74.8 kg)  04/01/19 164 lb (74.4 kg)  03/08/19 171 lb 14.4 oz (78 kg)    HPI:  Hypertension:  -  Her blood pressure at home has been running: 138/58; says "I thought it was pretty good after I already had three cups of coffee."  "I do not have high blood pressures here; I have them when I come into the doctor's office."  - Patient reports good compliance with medication and/or lifestyle modification  - Her denies acute concerns or problems related to treatment plan  - She denies new onset of: chest pain, exercise intolerance, shortness of breath, dizziness, visual changes, headache, lower extremity swelling or claudication.   Last 3 blood pressure readings in our office are as follows: BP Readings from Last 3 Encounters:  06/08/19 (!) 132/58  04/01/19 (!) 120/58  03/08/19 (!) 164/86   Filed Weights   06/08/19 1000  Weight: 165 lb (74.8 kg)     HPI:  Hyperlipidemia:  63 y.o. female here for cholesterol follow-up.   - Patient reports good compliance with treatment plan of:  medication and/ or lifestyle management.    - Patient denies any acute concerns or problems with management plan   Was told by Dr. Debara Pickett that her cholesterol is genetic.  Notes she also takes coumadin that limits her greens.  Says now she's having to limit eating everything, veggies, fats, sugars, bread.  Says since her heart attack she's been "paranoid about what's going on inside," because she had none of the classic symptoms of a heart attack.  - She denies new onset of: myalgias, arthralgias, increased fatigue more than normal, chest pains, exercise intolerance, shortness of breath, dizziness, visual changes, headache, lower extremity  swelling or claudication.   Most recent cholesterol panel was:  Lab Results  Component Value Date   CHOL 128 03/08/2019   HDL 48 03/08/2019   LDLCALC 47 03/08/2019   TRIG 165 (H) 03/08/2019   CHOLHDL 2.7 03/08/2019   Hepatic Function Latest Ref Rng & Units 03/08/2019 09/15/2018 02/08/2018  Total Protein 6.0 - 8.5 g/dL 6.9 6.9 7.7  Albumin 3.8 - 4.8 g/dL 4.9(H) 4.7 5.0(H)  AST 0 - 40 IU/L _0 ALT 0 - 32 IU/L 34(H) 28 36(H)  Alk Phosphatase 39 - 117 IU/L 52 63 59  Total Bilirubin 0.0 - 1.2 mg/dL 0.4 0.3 0.6      Depression screen [facility] 2/9 06/08/2019 03/08/2019 12/14/2018 09/13/2018 05/11/2018  Decreased Interest 0 0 0 0 0  Down, Depressed, Hopeless 0 0 0 0 0  PHQ - 2 Score 0 0 0 0 0  Altered sleeping 1 1 0 0 0  Tired, decreased energy 0 0 0 0 0  Change in appetite 0 1 0 0 0  Feeling bad or failure about yourself  0 0 0 0 0  Trouble concentrating 0 0 0 0 0  Moving slowly or fidgety/restless 0 0 0 0 0  Suicidal thoughts 0 0 0 0 0  PHQ-9 Score 1 2 0 0 0  Difficult doing work/chores Not difficult at all Not difficult at all Not difficult at all Not difficult at all Not difficult at all    GAD 7 : Generalized Anxiety Score 06/08/2019 03/08/2019 12/14/2018  Nervous, Anxious, on Edge 0 0 0  Control/stop worrying 0 0 0  Worry too much - different things 0 0 0  Trouble relaxing 0 0 0  Restless 0 0 0  Easily annoyed or irritable 0 0 0  Afraid - awful might happen 0 0 0  Total GAD 7 Score 0 0 0  Anxiety Difficulty - Not difficult at all Not difficult at all     Impression and Recommendations:    1. Type 2 diabetes mellitus with other specified complication, without long-term current use of insulin ([place])   2. Hypertension associated with diabetes ([place])   3. Hyperlipidemia associated with type 2 diabetes mellitus ([place])   4. Hypothyroidism, unspecified type   5. Vitamin D insufficiency   6. Diarrhea due to drug- metformin     Diabetes Mellitus - Diarrhea due to AM Metformin  - Sugars at goal three months ago, 6.9. - Need for A1c re-check near future. - Discussed option of patient coming in to clinic or using at-home A1c test.  - Per patient, fasting blood sugars remaining elevated around 160.  - Historically managed on metformin 500 and Jardiance, with A1c at 6.1-6.5 in past. - Switching to 1000 XR metformin daily at bedtime due to side-effect of diarrhea with metformin in morning.  Patient will let us know if side-effects continue after this change.  - Per patient, would like to try diet and exercise for three months. - If at any time, patient is uncomfortable with her sugars, she may call in and ask for med adjustment.  - Patient would like nutritional consult with her multiple medical problems.  - Counseled patient on pathophysiology of disease and discussed various treatment options, which always includes dietary and lifestyle modification as first line.    - Importance of low carb, heart-healthy diet discussed with patient in addition to regular aerobic exercise of 29mn 5d/week or more.   - Check FBS and 2 hours after the biggest meal of your day.  Keep log and bring in next OV for my review.     - Also told patient if you ever feel poorly, please check your blood pressure and blood sugar, as one or the other could be the cause of your symptoms.  - Pt reminded about need for yearly eye and foot exams.  Told patient to make appt.for diabetic eye exam, CMAs here will do foot exams  - Handouts provided at patient's desire and or told to go online at the American Diabetes Association website for further information  - We will continue to monitor  Hypertension associated with DM - BP stable at this time. - No changes made to treatment plan today. - Continue management as established. - Will continue to monitor.  Hyperlipidemia associated with DM - Managed on statin. - At goal last check under management by Dr. Debara Pickett. - No changes made to treatment  plan today. - Continue management as established. - Will continue to monitor and re-check as recommended.  Hypothyroidism - Patient asymptomatic at this time. - Continue management as established. - No changes made to treatment plan today. - Will continue to monitor and re-check as recommended.  Vitamin D Deficiency - At goal last check. - Continue supplementation as prescribed. - Will continue to monitor and re-check as recommended.  BMI Counseling - Body mass index is 29.23 kg/m Explained to patient what BMI refers to, and what it means medically.    Told patient to think about it as a "medical risk stratification measurement" and how increasing BMI is associated with increasing risk/ or worsening state of various diseases such as hypertension, hyperlipidemia, diabetes, premature OA, depression etc.  American Heart Association guidelines for healthy diet, basically Mediterranean diet, and exercise guidelines of 30 minutes 5 days per week or more discussed in detail.  Health counseling performed.  All questions answered.  Lifestyle & Preventative Health Maintenance - Advised patient to continue working toward exercising to improve overall mental, physical, and emotional health.    - Advised patient to ice the area of pain in her hip whenever she walks. - To help prevent falls in the future, advised patient to place non-skid materials on the staircase.  - Reviewed the "spokes of the wheel" of mood and health management.  Stressed the importance of ongoing prudent habits, including regular exercise, appropriate sleep hygiene, healthful dietary habits, and prayer/meditation to relax.  - Encouraged patient to engage in daily physical activity, especially a formal exercise routine.  Recommended that the patient eventually strive for at least 150 minutes of moderate cardiovascular activity per week according to guidelines established by the [facility].   - Healthy dietary habits encouraged,  including low-carb, and high amounts of lean protein in diet.   - Patient should also consume adequate amounts of water.  Recommendations - Return in 3 months for blood work with OV 3 days later.  - Novel Covid -19 counseling done; all questions were answered.   - Current CDC / federal and [place] guidelines reviewed with patient  - Reminded pt of extreme importance of social distancing; wearing a mask when out in public; insensate handwashing and cleaning of surfaces, avoiding unnecessary trips for shopping and avoiding ALL but emergency appts etc. - Told patient to be prepared, not scared; and be smart for the sake of others - Patient will call with any additional concerns   - As part of my medical decision making, I reviewed the following data within the [place] History obtained from pt /family, CMA notes reviewed and incorporated if applicable, Labs reviewed, Radiograph/ tests reviewed if applicable and OV notes from prior OV's with me, as well as other specialists she/he has seen since seeing me last, were all reviewed and used in my medical decision making process today.   - Additionally, discussion had with patient regarding txmnt plan, their biases about that plan etc were used in my medical decision making today.   - The patient agreed with the plan and demonstrated an understanding of the instructions.   No barriers to understanding were identified.   - Red flag symptoms and signs discussed in detail.  Patient expressed understanding regarding what to do in case of emergency\ urgent symptoms.  The patient was advised to  call back or seek an in-person evaluation if the symptoms worsen or if the condition fails to improve as anticipated.   Return for f/up in 3 months with lab only A1C and OV 3 days later- doxy.    Orders Placed This Encounter  Procedures  . Ambulatory referral to diabetic education    Meds ordered this encounter  Medications  . metFORMIN  (GLUCOPHAGE-XR) 500 MG 24 hr tablet    Sig: Take 2 tablets (1,000 mg total) by mouth at bedtime.    Dispense:  180 tablet    Refill:  0    Medications Discontinued During This Encounter  Medication Reason  . fluticasone (FLONASE) 50 MCG/ACT nasal spray Error  . metFORMIN (GLUCOPHAGE-XR) 500 MG 24 hr tablet       Note:  This note was prepared with assistance of Dragon voice recognition software. Occasional wrong-word or sound-a-like substitutions may have occurred due to the inherent limitations of voice recognition software.  This document serves as a record of services personally performed by Mellody Dance, DO. It was created on her behalf by Toni Amend, a trained medical scribe. The creation of this record is based on the scribe's personal observations and the provider's statements to them.   This case required medical decision making of at least moderate complexity. The above documentation has been reviewed to be accurate and was completed by Marjory Sneddon, D.O.       Patient Care Team    Relationship Specialty Notifications Start End  Mellody Dance, DO PCP - General Family Medicine  01/05/19   Pixie Casino, MD PCP - Cardiology Cardiology Admissions 03/05/18     -Vitals obtained; medications/ allergies reconciled;  personal medical, social, Sx etc.histories were updated by CMA, reviewed by me and are reflected in chart  Patient Active Problem List   Diagnosis Date Noted  . Hyperlipidemia associated with type 2 diabetes mellitus ([place]) 02/03/2018    Priority: High  . 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 DM2 (diabetes mellitus, type 2) ([place]) 01/26/2013    Priority: High  . Hypertension associated with diabetes ([place]) 01/26/2013    Priority: High  . Hypothyroidism 02/03/2018    Priority: Medium  . 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 DVT (deep venous thrombosis) ([place]) 01/26/2013    Priority: Medium  .  Pulmonary embolus ([place]) 01/26/2013    Priority: Medium  . Vitamin D insufficiency 09/13/2018    Priority: Low  . S/P AVR 01/26/2013    Priority: Low  . Long term (current) use of anticoagulants 10/11/2012    Priority: Low  . Diarrhea due to drug- metformin 06/08/2019  . Caregiver stress syndrome 03/08/2019  . White coat syndrome with diagnosis of hypertension 09/13/2018  . Acute maxillary sinusitis 04/19/2018  . LBBB (left bundle branch block) 04/06/2014  . Aortic stenosis 01/26/2013  . 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 Cardiomyopathy, ischemic 01/26/2013     Current Meds  Medication Sig  . Alirocumab (PRALUENT) 150 MG/ML SOAJ Inject 1 Dose into the skin every 14 (fourteen) days.  Marland Kitchen aspirin EC 81 MG tablet Take 81 mg by mouth daily.  . furosemide (LASIX) 40 MG tablet TAKE 1/2 TABLET BY MOUTH DAILY.  Marland Kitchen JARDIANCE 25 MG TABS tablet TAKE 1 TABLET BY MOUTH ONCE A DAY  . levothyroxine (SYNTHROID) 50 MCG tablet TAKE 1 TABLET BY MOUTH DAILY BEFORE BREAKFAST.  . metFORMIN (GLUCOPHAGE-XR) 500 MG 24 hr tablet Take  2 tablets (1,000 mg total) by mouth at bedtime.  . metoprolol succinate (TOPROL-XL) 100 MG 24 hr tablet TAKE 1 TABLET BY MOUTH DAILY. TAKE WITH OR IMMEDIATELY FOLLOWING A MEAL.  . ramipril (ALTACE) 5 MG capsule TAKE 1 CAPSULE BY MOUTH DAILY.  . rosuvastatin (CRESTOR) 40 MG tablet TAKE 1 TABLET BY MOUTH DAILY.  Marland Kitchen warfarin (COUMADIN) 2 MG tablet TAKE 1 TO 1 AND 1/2 TABLETS BY MOUTH DAILY AS DIRECTED BY COUMADIN CLINIC.  . [DISCONTINUED] metFORMIN (GLUCOPHAGE-XR) 500 MG 24 hr tablet TAKE 1 TABLET BY MOUTH IN THE MORNING AND 2 TABLETS AT NIGHT     Allergies  Allergen Reactions  . Niaspan [Niacin Er] Other (See Comments)    Flushing, even with ASA     ROS:  See above HPI for pertinent positives and negatives   Objective:   Blood pressure (!) 132/58, temperature (!) 96.6 F (35.9 C), temperature source Oral, height _0  (1.6 m), weight 165 lb (74.8 kg).  (if some vitals  are omitted, this means that patient was UNABLE to obtain them even though they were asked to get them prior to OV today.  They were asked to call us at their earliest convenience with these once obtained.)  General: A & O * 3; visually in no acute distress; in usual state of health.  Skin: Visible skin appears normal and pt's usual skin color HEENT:  EOMI, head is normocephalic and atraumatic.  Sclera are anicteric. Neck has a good range of motion.  Lips are noncyanotic Chest: normal chest excursion and movement Respiratory: speaking in full sentences, no conversational dyspnea; no use of accessory muscles Psych: insight good, mood- appears full

## 2019-06-20 ENCOUNTER — Other Ambulatory Visit: Payer: Self-pay

## 2019-06-20 ENCOUNTER — Ambulatory Visit (INDEPENDENT_AMBULATORY_CARE_PROVIDER_SITE_OTHER): Payer: BC Managed Care – PPO | Admitting: Pharmacist

## 2019-06-20 DIAGNOSIS — Z7901 Long term (current) use of anticoagulants: Secondary | ICD-10-CM | POA: Diagnosis not present

## 2019-06-20 DIAGNOSIS — I743 Embolism and thrombosis of arteries of the lower extremities: Secondary | ICD-10-CM | POA: Diagnosis not present

## 2019-06-20 DIAGNOSIS — Z952 Presence of prosthetic heart valve: Secondary | ICD-10-CM | POA: Diagnosis not present

## 2019-06-20 LAB — POCT INR: INR: 3.6 — AB (ref 2.0–3.0)

## 2019-07-07 LAB — HM DIABETES EYE EXAM

## 2019-07-18 ENCOUNTER — Other Ambulatory Visit: Payer: Self-pay | Admitting: Family Medicine

## 2019-07-18 ENCOUNTER — Other Ambulatory Visit: Payer: Self-pay | Admitting: Internal Medicine

## 2019-07-18 DIAGNOSIS — E1169 Type 2 diabetes mellitus with other specified complication: Secondary | ICD-10-CM

## 2019-08-01 ENCOUNTER — Other Ambulatory Visit: Payer: Self-pay

## 2019-08-01 ENCOUNTER — Ambulatory Visit (INDEPENDENT_AMBULATORY_CARE_PROVIDER_SITE_OTHER): Payer: BC Managed Care – PPO | Admitting: Pharmacist Clinician (PhC)/ Clinical Pharmacy Specialist

## 2019-08-01 DIAGNOSIS — Z7901 Long term (current) use of anticoagulants: Secondary | ICD-10-CM

## 2019-08-01 DIAGNOSIS — Z952 Presence of prosthetic heart valve: Secondary | ICD-10-CM | POA: Diagnosis not present

## 2019-08-01 LAB — POCT INR: INR: 4.1 — AB (ref 2.0–3.0)

## 2019-08-06 ENCOUNTER — Other Ambulatory Visit: Payer: Self-pay | Admitting: Family Medicine

## 2019-08-09 ENCOUNTER — Encounter: Payer: BC Managed Care – PPO | Attending: Family Medicine | Admitting: Registered"

## 2019-08-09 ENCOUNTER — Encounter: Payer: Self-pay | Admitting: Registered"

## 2019-08-09 ENCOUNTER — Other Ambulatory Visit: Payer: Self-pay

## 2019-08-09 DIAGNOSIS — E1165 Type 2 diabetes mellitus with hyperglycemia: Secondary | ICD-10-CM | POA: Diagnosis present

## 2019-08-09 NOTE — Progress Notes (Signed)
Diabetes Self-Management Education  Visit Type: First/Initial  Appt. Start Time: 0800 Appt. End Time: 0920  08/10/2019  [name], identified by name and date of birth, is a 64 y.o. female with a diagnosis of Diabetes: Type 2.   ASSESSMENT  There were no vitals taken for this visit. There is no height or weight on file to calculate BMI.   Pt reports she got her A1c down to 6.5% by eating mostly baked chicken & salad but got burned out, pt states she loves fruit but was scared away from it. Also enjoys milk, used to drink skim, now drinking 1% because believes skim has more sugar. Pt states she knows her blood sugar was high over the Christmas holiday.  Pt states due to side effects she stopped morning dose of metformin. Pt reports doing well with Jardiance.  SMBG: Pre-meal 140-160. Pt states her doctors are stressing the importance of reducing the morning BG.  Pt states her cholesterol is well managed with medication, and still working with MD to get TG (currently at 165) down.  Pt reports she limits foods with Vit K due to coumadin.  Diabetes Self-Management Education - 08/09/19 0810      Visit Information   Visit Type  First/Initial      Initial Visit   Diabetes Type  Type 2    Are you currently following a meal plan?  No    Are you taking your medications as prescribed?  No   Metformin 1000 mg, Reino Kent   Date Diagnosed  Jan 2012      Health Coping   How would you rate your overall health?  Good      Psychosocial Assessment   Patient Belief/Attitude about Diabetes  Defeat/Burnout    How often do you need to have someone help you when you read instructions, pamphlets, or other written materials from your doctor or pharmacy?  1 - Never    What is the last grade level you completed in school?  12      Complications   Last HgB A1C per patient/outside source  6.9 %    How often do you check your blood sugar?  3-4 times/day    Fasting Blood glucose range (mg/dL)   130-179   140-160   Number of hypoglycemic episodes per month  0    Have you had a dilated eye exam in the past 12 months?  Yes    Have you had a dental exam in the past 12 months?  No    Are you checking your feet?  Yes    How many days per week are you checking your feet?  7      Dietary Intake   Breakfast  2 scrambled eggs, cheese, breakfast meat, blueberries    Snack (morning)  handful of cheeze-it crackers    Lunch  1/2 sandwich, lowfat ham, low fat cheese, [place]    [employer], salad with iceburg d/t warfarin, small portion of potato    Snack (evening)  cheese-it crackers, nuts, bite size chocolate    Beverage(s)  not a lot of water, black coffee, diet pepsi, unsweet tea      Exercise   Exercise Type  Light (walking / raking leaves)    How many days per week to you exercise?  5    How many minutes per day do you exercise?  30    Total minutes per week of exercise  150  Patient Education   Previous Diabetes Education  No    Disease state   Factors that contribute to the development of diabetes    Nutrition management   Role of diet in the treatment of diabetes and the relationship between the three main macronutrients and blood glucose level;Carbohydrate counting    Physical activity and exercise   Role of exercise on diabetes management, blood pressure control and cardiac health.    Medications  Reviewed patients medication for diabetes, action, purpose, timing of dose and side effects.    Monitoring  Identified appropriate SMBG and/or A1C goals.;Purpose and frequency of SMBG.      Individualized Goals (developed by patient)   Nutrition  General guidelines for healthy choices and portions discussed    Physical Activity  Exercise 3-5 times per week    Monitoring   test my blood glucose as discussed      Outcomes   Expected Outcomes  Demonstrated interest in learning. Expect positive outcomes    Future DMSE  4-6 wks    Program Status  Completed       Individualized  Plan for Diabetes Self-Management Training:   Learning Objective:  Patient will have a greater understanding of diabetes self-management. Patient education plan is to attend individual and/or group sessions per assessed needs and concerns.   Patient Instructions  Look at your vitamin D to make sure it doesn't have vit K with it. Also notice the units and update at your next [facility] MD visit.  Diet: Aim to eat balanced meals and snacks, including protein when eating carbohydrates. To know how you are doing with your changes, you can check you blood sugar at 2 different times: Fasting or pre-meals: target 80-130 mg/dL 2 hours after a meal: target less than 180 mg/dL Consider a nighttime snack with carb + protein to see if it affects your morning blood sugar. Can also experiment checking your blood sugar during the night, use handout for guide. Consider brainstorming ways to drink more water  Continue with your exercise 4-5x/week. When ready to increase exercise consider doing yoga with your daughter.  Aim to get good quality sleep.   Expected Outcomes:  Demonstrated interest in learning. Expect positive outcomes  Education material provided: Morning High Blood Sugar, snack sheet, carb counting card  If problems or questions, patient to contact team via:  Phone and MyChart  Future DSME appointment: 4-6 wks

## 2019-08-09 NOTE — Patient Instructions (Addendum)
Look at your vitamin D to make sure it doesn't have vit K with it. Also notice the units and update at your next [facility] MD visit.  Diet: Aim to eat balanced meals and snacks, including protein when eating carbohydrates. To know how you are doing with your changes, you can check you blood sugar at 2 different times: Fasting or pre-meals: target 80-130 mg/dL 2 hours after a meal: target less than 180 mg/dL Consider a nighttime snack with carb + protein to see if it affects your morning blood sugar. Can also experiment checking your blood sugar during the night, use handout for guide. Consider brainstorming ways to drink more water  Continue with your exercise 4-5x/week. When ready to increase exercise consider doing yoga with your daughter.  Aim to get good quality sleep.

## 2019-08-10 DIAGNOSIS — E1165 Type 2 diabetes mellitus with hyperglycemia: Secondary | ICD-10-CM | POA: Insufficient documentation

## 2019-08-29 ENCOUNTER — Other Ambulatory Visit: Payer: Self-pay

## 2019-08-29 ENCOUNTER — Ambulatory Visit (INDEPENDENT_AMBULATORY_CARE_PROVIDER_SITE_OTHER): Payer: BC Managed Care – PPO | Admitting: Pharmacist Clinician (PhC)/ Clinical Pharmacy Specialist

## 2019-08-29 DIAGNOSIS — Z952 Presence of prosthetic heart valve: Secondary | ICD-10-CM

## 2019-08-29 DIAGNOSIS — Z7901 Long term (current) use of anticoagulants: Secondary | ICD-10-CM | POA: Diagnosis not present

## 2019-08-29 LAB — POCT INR: INR: 3.7 — AB (ref 2.0–3.0)

## 2019-09-19 ENCOUNTER — Encounter: Payer: BC Managed Care – PPO | Attending: Family Medicine | Admitting: Registered"

## 2019-09-19 ENCOUNTER — Other Ambulatory Visit: Payer: Self-pay

## 2019-09-19 ENCOUNTER — Encounter: Payer: Self-pay | Admitting: Registered"

## 2019-09-19 DIAGNOSIS — E1165 Type 2 diabetes mellitus with hyperglycemia: Secondary | ICD-10-CM | POA: Insufficient documentation

## 2019-09-19 DIAGNOSIS — E119 Type 2 diabetes mellitus without complications: Secondary | ICD-10-CM

## 2019-09-19 NOTE — Progress Notes (Signed)
Diabetes Self-Management Education  Visit Type: Follow-up  Appt. Start Time: 0800 Appt. End Time: 0830  09/19/2019  [name], identified by name and date of birth, is a 64 y.o. female with a diagnosis of Diabetes: Type 2.   ASSESSMENT  There were no vitals taken for this visit. There is no height or weight on file to calculate BMI.  Pt states her FBS has not changed from last visit. Pt states she tried the evening snack and did not notice a difference.   Pt states her diet is the same as last visit. Patient is not specifically counting carbohydrates but RD estimates she is eating 15 grams or less per meal except on occasion when having spaghetti which may be closer to 60 grams, but that is not the normal meal. 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   Pt states she has been checking her FBS but only occasionally at other times.   Patient states she is off work 1:30 pm and has stress at home related to caring for her husband who suffers from depression and she believes he had a small stroke but he will not accept medical care.    Diabetes Self-Management Education - 09/19/19 1206      Visit Information   Visit Type  Follow-up      Initial Visit   Diabetes Type  Type 2    Are you taking your medications as prescribed?  Yes      Complications   How often do you check your blood sugar?  1-2 times/day    Fasting Blood glucose range (mg/dL)  130-179    Postprandial Blood glucose range (mg/dL)  130-179      Individualized Goals (developed by patient)   Nutrition  General guidelines for healthy choices and portions discussed    Monitoring   test my blood glucose as discussed    Problem Solving  --   work on reducing stress     Outcomes   Expected Outcomes  Demonstrated interest in learning. Expect positive outcomes    Future DMSE  4-6 wks    Program Status  Completed      Subsequent Visit   Since your last visit  have you continued or begun to take your medications as prescribed?  Yes    Since your last visit, are you checking your blood glucose at least once a day?  Yes       Individualized Plan for Diabetes Self-Management Training:   Learning Objective:  Patient will have a greater understanding of diabetes self-management. Patient education plan is to attend individual and/or group sessions per assessed needs and concerns.    Patient Instructions  Your morning numbers may be related to what is going on at night in your body's way to maintain blood sugar while you sleep. Try some stress management strategies before bed. You may want to read Start Here Now by Tereasa Coop Bring your blood sugar log to your next appointment.   Expected Outcomes:  Demonstrated interest in learning. Expect positive outcomes  Education material provided: Sleep Hygiene  If problems or questions, patient to contact team via:  Phone and MyChart  Future DSME appointment: 4-6 wks

## 2019-09-19 NOTE — Patient Instructions (Addendum)
Your morning numbers may be related to what is going on at night in your body's way to maintain blood sugar while you sleep. Try some stress management strategies before bed. You may want to read Start Here Now by Tereasa Coop Bring your blood sugar log to your next appointment.

## 2019-09-28 ENCOUNTER — Ambulatory Visit (INDEPENDENT_AMBULATORY_CARE_PROVIDER_SITE_OTHER): Payer: BC Managed Care – PPO | Admitting: Pharmacist

## 2019-09-28 ENCOUNTER — Other Ambulatory Visit: Payer: Self-pay

## 2019-09-28 ENCOUNTER — Ambulatory Visit: Payer: BC Managed Care – PPO | Admitting: Internal Medicine

## 2019-09-28 ENCOUNTER — Encounter: Payer: Self-pay | Admitting: Internal Medicine

## 2019-09-28 VITALS — BP 148/70 | HR 83 | Ht 63.0 in | Wt 170.0 lb

## 2019-09-28 DIAGNOSIS — Z952 Presence of prosthetic heart valve: Secondary | ICD-10-CM

## 2019-09-28 DIAGNOSIS — E785 Hyperlipidemia, unspecified: Secondary | ICD-10-CM

## 2019-09-28 DIAGNOSIS — Z951 Presence of aortocoronary bypass graft: Secondary | ICD-10-CM | POA: Diagnosis not present

## 2019-09-28 DIAGNOSIS — E7849 Other hyperlipidemia: Secondary | ICD-10-CM | POA: Diagnosis not present

## 2019-09-28 DIAGNOSIS — E1169 Type 2 diabetes mellitus with other specified complication: Secondary | ICD-10-CM

## 2019-09-28 DIAGNOSIS — Z7901 Long term (current) use of anticoagulants: Secondary | ICD-10-CM

## 2019-09-28 LAB — POCT INR: INR: 2.3 (ref 2.0–3.0)

## 2019-09-28 NOTE — Progress Notes (Signed)
Chief Complaint:  No complaints  Primary Care Physician: Mellody Dance, DO  HPI:  Katrina Obrien is a 64 year old female who has a history of an unfortunate episode with left popliteal artery thrombus, PE, severe aortic stenosis which was ultimately diagnosed in a bicuspid aortic valve, as well as multivessel coronary disease, anterior MI, EF about 40% to 45% - all which came to a head about the same time. She underwent 3-vessel bypass as well as mechanical aortic valve replacement. She has done well on Coumadin and was ultimately diagnosed with diabetes and is followed by Dr. Chalmers Cater for this. Now she is markedly improved, although she has an ischemic cardiomyopathy with an EF of 40% to 45%. Her activity level is pretty good.  We recently performed repeat lipid testing, which demonstrated an elevated LDL particle number of 2163. The calculated LDL was 80. I asked her to increase her Crestor to 40 mg at night and add 500 mg Niaspan. She reports significant flushing and intolerance to the Niaspan, despite taking aspirin 30 minutes prior.  Her laboratory work does look improved, with her particle number being reduced to 1547, LDL 55, HDL of 39.  She does report however worsening pain in her capsule when she walks that works its way up her legs. This is nothing as significant as when she was originally diagnosed with her acute arterial thrombus, but has concerns for claudication. Alternatively, this could represent myalgias from her statin.  Katrina Obrien returns today for followup. She is doing extremely well. She continues to be active and has no complaints of chest pain worsening shortness of breath. She does feel like her legs tire easily however this could be improved with more exercise. She denies any symptoms of claudication. Her INR has been therapeutic. She reports good blood sugar control and has an appointment with her endocrinologist in the next couple of weeks. We recently obtained an  echocardiogram which showed normalization of her EF to 55-60% in March of 2015. She's also had lower extremity arterial Dopplers which show preserved ABIs.  I the pleasure seeing Katrina Obrien back in the office today. She reports doing fairly well except occasionally she gets somewhat "swimmy headed". This is worse with change of position. As previously noted her EF has improved back to 55-60%. Recently she's had persistently elevated lipoprotein particles including an LDL particle number in the 1900s. This is despite being on Zetia and Crestor 40 mg. Based on this I recommended that she start on Pralulent. She's been taking this now for just over a month and is having no problems with injection site reactions or side effects. We will plan to check her cholesterol again in about 2 months.  Katrina Obrien was seen in the office today in follow-up. Overall she seems to be doing very well. 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 She will be due for repeat cholesterol test after the first of the year. She denies any chest pain or worsening shortness of breath. She is complaining of some occasional swelling in her lower extremities which improves in the morning. She also gets some pain in her right leg. She is overdue for repeat lower extremity Dopplers and does have a history of course of arterial thrombus in the leg. Warfarin has been therapeutic and managed by the Coumadin clinic. Cholesterol is still not at goal despite max dose therapy. For brief period of time she was on samples of Repatha (PCSK9 inhibitor) with a marked response in cholesterol, however  insurance would not cover this.  12/10/2015  Katrina Obrien returns today for follow-up. She says this is the best she has felt some time. She denies any chest pain or worsening shortness of breath. Recently her cholesterol numbers have risen with LDL now 102. This would put her not at goal therapy. Given her multiple comorbidities. Recent data indicates the fact  that driving LDL below 50 may improve cardiovascular outcomes. This was with the use of maximal statin therapy as well as the addition of a PCSK9 inhibitor, specifically Repatha. In fact, she had responded very well to Pralulent in the past, with LDL as low as 20 however that was not covered by insurance. 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 Aggressive therapy is warranted.  07/15/2016  Katrina Obrien returns today for follow-up. Overall she feels very well. She had one episode of intense burning in her chest which happened a few weeks ago. It was worse when she was laying down at night. She did not take any medicine for that she was concerned she could take it with her other medicines. It sounds like reflux she's not had any more symptoms like that. I told her she could use Tums or an H2 blocker for that as needed. She recently went on to [place] and her LDL cholesterol is now 19. This should be a significant benefit in slowing her overall cardiovascular risks. As mentioned previously she had an echo this past year which showed normal LVEF 55-60% and a normally functioning mechanical aortic valve. Her INR was therapeutic today.  07/09/2017  Katrina Obrien was seen today in follow-up.  She seems to be doing very well.  She reported that she had to shovel snow for several hours with her son and at the end of that did develop some chest tightness/soreness which went away very quickly.  She said she felt like she overworked herself, but generally has had no symptoms.  Recently we repeated a lipid profile which showed marked improvement in her LDL to 28 on statin, ezetimibe and Repatha.  Her triglycerides were elevated at 349, however she mentioned today that this was nonfasting.  Also her blood sugar was elevated recently.  She was started on Jardiance, which I suspect has made a big difference in her blood sugars.  She says  her hemoglobin A1c now is down to 6.1.  Accordingly, her triglycerides have probably improved as well.  Fortunately, her husband was not with her today.  Apparently he is suffering from depression and has had several bouts.  He is not interested in leaving the house and is not as engaged in activities as he has been in the past.  This is obviously causing her a lot of stress and is not clear to her how she can help him.  03/19/2018  Katrina Obrien returns today for follow-up.  She says she is felt today the best she has in years.  Unfortunately, she had a recent colonoscopy which turned out okay however having to come off all of her medications was challenging for her and she was not a fan of bridging Lovenox.  She does report her blood sugars been well controlled.  Her cholesterol is at goal with recent LDL of 58 however it is actually been lower with Repatha.  Her triglycerides are also well controlled at 110.  Her Saint Jude mechanical valve is functioning properly by echo in 2017 which showed normal LVEF.  She  denies any chest pain symptoms with history of three-vessel CABG in 2012.  Blood pressure is at goal today as well.  EKG shows a stable left bundle branch block.  09/16/2018  Katrina Obrien is seen today in follow-up.  She is excited at the fact that she will be having a new grandbaby fairly soon.  Overall she is doing well without new symptoms.  She denies any chest pain or worsening shortness of breath.  Her INR was repeated today was 3.5.  No adjustments were made to her warfarin.  Her mechanical valve is working properly and was last assessed by echo last year.  She has lost some additional weight.  Her blood pressure was elevated little today however she has been under some stress and very recently took her medications.   09/28/2019  Katrina Obrien returns today for follow-up.  Overall she is doing well.  She said she has had both coronavirus vaccines.  She is back teaching in the classroom.  Unfortunately she has  been struggling with some weight gain.  This was due to stress and other issues with her father-in-law who ultimately died and also having to care for her husband.  Her triglycerides were elevated over the summer but LDL remained at goal.  Her A1c was 6.9 likely indicating that diabetes was a factor.  She is now working with a dietitian, exercising more regularly and eating better.  INR was 2.3 today which is slightly below her target of 2.5-3.5.  PMHx:  Past Medical History:  Diagnosis Date  . 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 Clotting disorder (HCC)    anticoagulant - coumadin hx DVT, PE  . Coronary artery disease    s/p 3- vessel bypass   . Diabetes mellitus without complication ([place])   . DVT (deep venous thrombosis) ([place])   . Heart murmur   . Hyperlipidemia   . Hypertension   . Ischemic cardiomyopathy    40 to 45%  . Left bundle branch block   . Mechanical heart valve present    Jan 2012  . Popliteal artery occlusion, left (HCC)    recent  status  post angioplasty  . Pulmonary embolism ([place])    associated with DVT  . Pulmonary embolus ([place])   . Thyroid disease    hyperthyroidism    Past Surgical History:  Procedure Laterality Date  . AORTIC VALVE REPLACEMENT (AVR)/CORONARY ARTERY BYPASS GRAFTING (CABG)  08/16/2010  VAN TRIGT   AV replacement w/19-mm mechanical St Jude valve ([id number]),CABGX3  lima to LAD, SVG to distal circ,SVG to posterior descending  . ASD REPAIR    . CARDIAC CATHETERIZATION  08/08/2010   ,LAD 50% PROX,605 TANDEM SEGMENTAL STENOSIS,LEFT CIRC 60%-AV GROOVE, RGT COR 40% PROX ON THE BEND ,80% PROX PDA,  . CORONARY ARTERY BYPASS GRAFT  08/16/2010   LIMA TO lad,svg to distal circ ,svg to posterior descending  . DOPPLER ECHOCARDIOGRAPHY  08/09/2010   EF35% to 40% ,bbicuspid ;severely  thickened ,severely calcified leaflets, LV normal  . LEA doppler  08/07/2010   left  popliteal occlusion ,and anterior tibial complete occlusion  . NM MYOCAR PERF WALL MOTION  08/06/2010   EF 31%  LOW RISK SCAN  . nuc    . PV ANGIOGRAM  08/08/2010   successful PTA LEFT POPLITEAL    FAMHx:  Family History  Problem Relation Age of Onset  . Cancer Mother   . Cancer  Father   . CAD Father   . Heart attack Father 40       first MI, 2 major after that, multiple silent MIs  . Stroke Brother   . CAD Brother 11       first stent  . Heart attack Brother   . CAD Brother 50       CABG   . Hypertension Brother   . Heart attack Brother 66       died from MI  . Colon cancer Neg Hx   . Esophageal cancer Neg Hx   . Rectal cancer Neg Hx   . Stomach cancer Neg Hx     SOCHx:   reports that she has never smoked. She has never used smokeless tobacco. She reports current alcohol use of about 7.0 standard drinks of alcohol per week. She reports that she does not use drugs.  ALLERGIES:  Allergies  Allergen Reactions  . Niaspan [Niacin Er] Other (See Comments)    Flushing, even with ASA    ROS: Pertinent items noted in HPI and remainder of comprehensive ROS otherwise negative.  HOME MEDS: Current Outpatient Medications  Medication Sig Dispense Refill  . Alirocumab (PRALUENT) 150 MG/ML SOAJ Inject 1 Dose into the skin every 14 (fourteen) days. 6 pen 11  . aspirin EC 81 MG tablet Take 81 mg by mouth daily.    . furosemide (LASIX) 40 MG tablet TAKE 1/2 TABLET BY MOUTH DAILY. 45 tablet 2  . JARDIANCE 25 MG TABS tablet TAKE 1 TABLET BY MOUTH ONCE A DAY 30 tablet 1  . levothyroxine (SYNTHROID) 50 MCG tablet TAKE 1 TABLET BY MOUTH DAILY BEFORE BREAKFAST. 90 tablet 0  . metFORMIN (GLUCOPHAGE-XR) 500 MG 24 hr tablet Take 2 tablets (1,000 mg total) by mouth at bedtime. 180 tablet 0  . metoprolol succinate (TOPROL-XL) 100 MG 24 hr tablet TAKE 1 TABLET BY MOUTH DAILY. TAKE WITH OR IMMEDIATELY FOLLOWING A MEAL. 90 tablet 2  . ramipril (ALTACE) 5 MG capsule TAKE 1 CAPSULE BY MOUTH  DAILY. 90 capsule 0  . rosuvastatin (CRESTOR) 40 MG tablet TAKE 1 TABLET BY MOUTH DAILY. 90 tablet 2  . VITAMIN D PO Take by mouth.    . warfarin (COUMADIN) 2 MG tablet TAKE 1 TO 1 AND 1/2 TABLETS BY MOUTH DAILY AS DIRECTED BY COUMADIN CLINIC. 120 tablet 1   No current facility-administered medications for this visit.    LABS/IMAGING: No results found for this or any previous visit (from the past 48 hour(s)). No results found.  VITALS: BP (!) 148/70   Pulse 83   Ht 5\' 3"  (1.6 m)   Wt 170 lb (77.1 kg)   LMP  (LMP Unknown)   SpO2 98%   BMI 30.11 kg/m   EXAM: General appearance: alert and no distress Neck: no adenopathy, no carotid bruit, no JVD, supple, symmetrical, trachea midline and thyroid not enlarged, symmetric, no tenderness/mass/nodules Lungs: clear to auscultation bilaterally Heart: regular rate and rhythm, S1, S2 normal, no murmur, click, rub or gallop and Sharp mechanical valve sound Abdomen: soft, non-tender; bowel sounds normal; no masses,  no organomegaly Extremities: extremities normal, atraumatic, no cyanosis or edema Pulses: 2+ and symmetric Skin: Skin color, texture, turgor normal. No rashes or lesions Neurologic: Grossly normal  EKG: Sinus rhythm 83, LBBB-personally reviewed  ASSESSMENT: 1. Coronary artery disease status post 3 vessel CABG (LIMA to LAD, SVG to circumflex, SVG to PDA) - 07/2010 2. Severe aortic stenosis and a bicuspid aortic valve, status  post 19 mm St. Jude mechanical aortic valve excellent recent left popliteal artery occlusion with rest pain status post angioplasty (2012) 3. History of pulmonary emboli associated with DVT 4. Ischemic cardiomyopathy, EF now normalized at 55-60% 5. Hypertension - at goal 6. Familial hyperlipidemia - LDL>190 on Repatha 7. Diabetes type 2 - controlled 8. LBBB   PLAN: 1.   Katrina Obrien seems to be doing well although has had some weight gain and a lot of stress.  Her A1c was higher than ideal, so she has been  working on weight loss and dietary changes.  We will plan a repeat lipid in 6 months along with an A1c and follow-up at that time.  Warfarin was adjusted today by Racquel one of our pharmacist to target her from 2.5-3.5.  Follow-up with me in 6 months or sooner as necessary.  Pixie Casino, MD, [facility], [place] Director of the Advanced Lipid Disorders &  Cardiovascular Risk Reduction Clinic Attending Cardiologist  Direct Dial: [phone]  Fax: [phone]  Website:  [email or web address] 09/28/2019, 9:05 AM

## 2019-09-28 NOTE — Patient Instructions (Signed)
Medication Instructions:  Your physician recommends that you continue on your current medications as directed. Please refer to the Current Medication list given to you today.  *If you need a refill on your cardiac medications before your next appointment, please call your pharmacy*   Lab Work: FASTING lab work in 6 months to check cholesterol & A1c If you have labs (blood work) drawn today and your tests are completely normal, you will receive your results only by: Marland Kitchen MyChart Message (if you have MyChart) OR . A paper copy in the mail If you have any lab test that is abnormal or we need to change your treatment, we will call you to review the results.   Testing/Procedures: NONE   Follow-Up: At [facility], you and your health needs are our priority.  As part of our continuing mission to provide you with exceptional heart care, we have created designated Provider Care Teams.  These Care Teams include your primary Cardiologist (physician) and Advanced Practice Providers (APPs -  Physician Assistants and Nurse Practitioners) who all work together to provide you with the care you need, when you need it.  We recommend signing up for the patient portal called "MyChart".  Sign up information is provided on this After Visit Summary.  MyChart is used to connect with patients for Virtual Visits (Telemedicine).  Patients are able to view lab/test results, encounter notes, upcoming appointments, etc.  Non-urgent messages can be sent to your provider as well.   To learn more about what you can do with MyChart, go to [email or web address].    Your next appointment:   6 month(s)  The format for your next appointment:   In Person  Provider:   You may see Pixie Casino, MD or one of the following Advanced Practice Providers on your designated Care Team:    Almyra Deforest, PA-C  Fabian Sharp, PA-C or   Roby Lofts, [place]    Other Instructions

## 2019-10-03 ENCOUNTER — Other Ambulatory Visit: Payer: Self-pay | Admitting: Family Medicine

## 2019-10-03 DIAGNOSIS — E1169 Type 2 diabetes mellitus with other specified complication: Secondary | ICD-10-CM

## 2019-10-08 ENCOUNTER — Other Ambulatory Visit: Payer: Self-pay | Admitting: Family Medicine

## 2019-10-17 ENCOUNTER — Other Ambulatory Visit: Payer: Self-pay | Admitting: Family Medicine

## 2019-10-18 ENCOUNTER — Telehealth: Payer: Self-pay

## 2019-10-18 NOTE — Telephone Encounter (Signed)
Please call pt to schedule appt.  No further refills until pt is seen.  Pt needs fasting labs prior to any further refills T. Meda Coffee, CMA

## 2019-10-26 ENCOUNTER — Telehealth: Payer: Self-pay | Admitting: Internal Medicine

## 2019-10-26 NOTE — Telephone Encounter (Signed)
PA for praluent submitted via CMMA Key: [facility] - PA Case ID: [id number] - Rx #[id number] Your PA request has been approved

## 2019-10-31 ENCOUNTER — Other Ambulatory Visit: Payer: Self-pay

## 2019-10-31 ENCOUNTER — Encounter: Payer: Self-pay | Admitting: Registered"

## 2019-10-31 ENCOUNTER — Encounter: Payer: BC Managed Care – PPO | Attending: Family Medicine | Admitting: Registered"

## 2019-10-31 DIAGNOSIS — E1165 Type 2 diabetes mellitus with hyperglycemia: Secondary | ICD-10-CM | POA: Diagnosis present

## 2019-10-31 DIAGNOSIS — E119 Type 2 diabetes mellitus without complications: Secondary | ICD-10-CM

## 2019-10-31 NOTE — Progress Notes (Signed)
Diabetes Self-Management Education  Visit Type: Follow-up  Appt. Start Time: 0800 Appt. End Time: 0830  10/31/2019  Katrina Obrien, identified by name and date of birth, is a 64 y.o. female with a diagnosis of Diabetes: Type 2.   ASSESSMENT  There were no vitals taken for this visit. There is no height or weight on file to calculate BMI.  Patient reports her daughters have been assisting in care giving for her husband and patient was able to spend some time at the beach. Pt reports she saw an immediate reduction in her FBS. Pt states she and her daughters have a plan to make a trip once a month to the beach. Patient states she also started a gratitude journal. Pt states she wakes up every 2 hrs, but does not stay awake as in the past and is able go back to sleep right away.  Pt states she has worked on drinking more water and has increased her exercise to 7 days a week as well as increased duration.  Pt states she enjoys yard work and mowing her lawn which will like continue to help reduce her stress. RD suggested before next winter she should come up with a plan to continue with stress management and exercise.  FBS: 135-150 mg/dL once was 168 mg/dL on a morning when she was finishing taxes.  Diabetes Self-Management Education - 10/31/19 0800      Visit Information   Visit Type  Follow-up      Initial Visit   Diabetes Type  Type 2      Dietary Intake   Breakfast  eggs, bacon,     Lunch  ham, potato salad, green beans, fruiti coctail,    Dinner  Easter, leftovers,    Snack (evening)  half deviled egg    Beverage(s)  water, decaf tea, coffee, wine.      Exercise   Exercise Type  Light (walking / raking leaves)    How many days per week to you exercise?  7    How many minutes per day do you exercise?  30    Total minutes per week of exercise  210      Patient Education   Medications  Reviewed patients medication for diabetes, action, purpose, timing of dose and side  effects.    Monitoring  Identified appropriate SMBG and/or A1C goals.    Psychosocial adjustment  Other (comment)      Individualized Goals (developed by patient)   Nutrition  General guidelines for healthy choices and portions discussed    Physical Activity  Exercise 5-7 days per week    Problem Solving  --   continue with stress reduction activities     Patient Self-Evaluation of Goals - Patient rates self as meeting previously set goals (% of time)   Monitoring  >75%    Problem Solving  >75%      Outcomes   Expected Outcomes  Demonstrated interest in learning. Expect positive outcomes    Future DMSE  PRN    Program Status  Completed      Subsequent Visit   Since your last visit have you continued or begun to take your medications as prescribed?  Yes   can only tolerate 1000 mg metformin   Since your last visit have you experienced any weight changes?  Loss    Weight Loss (lbs)  2    Since your last visit, are you checking your blood glucose at least once a day?  Yes       Individualized Plan for Diabetes Self-Management Training:   Learning Objective:  Patient will have a greater understanding of diabetes self-management. Patient education plan is to attend individual and/or group sessions per assessed needs and concerns.  Patient Instructions  Doristine Devoid job on starting a proactive plan to help with stress reduction Continue with getting plenty of water Great job on using your blood sugar numbers to help you bring your blood sugar done. Continue with increased exercise   Expected Outcomes:  Demonstrated interest in learning. Expect positive outcomes  Education material provided: none  If problems or questions, patient to contact team via:  Phone and MyChart  Future DSME appointment: PRN

## 2019-10-31 NOTE — Patient Instructions (Signed)
Great job on starting a proactive plan to help with stress reduction Continue with getting plenty of water Great job on using your blood sugar numbers to help you bring your blood sugar done. Continue with increased exercise

## 2019-11-01 ENCOUNTER — Other Ambulatory Visit: Payer: Self-pay

## 2019-11-01 ENCOUNTER — Ambulatory Visit (INDEPENDENT_AMBULATORY_CARE_PROVIDER_SITE_OTHER): Payer: BC Managed Care – PPO | Admitting: Pharmacist Clinician (PhC)/ Clinical Pharmacy Specialist

## 2019-11-01 DIAGNOSIS — Z7901 Long term (current) use of anticoagulants: Secondary | ICD-10-CM | POA: Diagnosis not present

## 2019-11-01 DIAGNOSIS — Z952 Presence of prosthetic heart valve: Secondary | ICD-10-CM

## 2019-11-01 DIAGNOSIS — I2699 Other pulmonary embolism without acute cor pulmonale: Secondary | ICD-10-CM | POA: Diagnosis not present

## 2019-11-01 LAB — POCT INR: INR: 2.1 (ref 2.0–3.0)

## 2019-11-07 ENCOUNTER — Other Ambulatory Visit: Payer: Self-pay | Admitting: Family Medicine

## 2019-11-07 NOTE — Telephone Encounter (Signed)
Patient called regarding her :   (Pt has scheduled appt for 11/22/19 w/ labs 3 dys prior) JARDIANCE 25 MG TABS tablet [id number]   Order Details Dose: 25 mg Route: Oral Frequency: Daily  Dispense Quantity: 30 tablet Refills: 0       Sig: Take 25 mg by mouth daily. **PATIENT NEEDS APT FOR FURTHER REFILLS**      Start Date: 10/10/19 End Date: --   Pls send refill order to :   [place], [place] - [address] [phone] (Phone) [phone] (Fax)   --glh

## 2019-11-08 MED ORDER — JARDIANCE 25 MG PO TABS: 25.0000 mg | ORAL_TABLET | Freq: Every day | ORAL | 0 refills | Status: DC

## 2019-11-14 ENCOUNTER — Telehealth: Payer: Self-pay

## 2019-11-14 NOTE — Telephone Encounter (Signed)
lmomed to reschedule appt for coumadin

## 2019-11-17 ENCOUNTER — Other Ambulatory Visit: Payer: Self-pay

## 2019-11-17 ENCOUNTER — Other Ambulatory Visit: Payer: Self-pay | Admitting: Family Medicine

## 2019-11-17 ENCOUNTER — Ambulatory Visit (INDEPENDENT_AMBULATORY_CARE_PROVIDER_SITE_OTHER): Payer: BC Managed Care – PPO | Admitting: Pharmacist Clinician (PhC)/ Clinical Pharmacy Specialist

## 2019-11-17 DIAGNOSIS — E119 Type 2 diabetes mellitus without complications: Secondary | ICD-10-CM

## 2019-11-17 DIAGNOSIS — Z7901 Long term (current) use of anticoagulants: Secondary | ICD-10-CM

## 2019-11-17 DIAGNOSIS — Z952 Presence of prosthetic heart valve: Secondary | ICD-10-CM

## 2019-11-17 DIAGNOSIS — E559 Vitamin D deficiency, unspecified: Secondary | ICD-10-CM

## 2019-11-17 DIAGNOSIS — E1169 Type 2 diabetes mellitus with other specified complication: Secondary | ICD-10-CM

## 2019-11-17 DIAGNOSIS — E039 Hypothyroidism, unspecified: Secondary | ICD-10-CM

## 2019-11-17 DIAGNOSIS — E1159 Type 2 diabetes mellitus with other circulatory complications: Secondary | ICD-10-CM

## 2019-11-17 LAB — POCT INR: INR: 2.9 (ref 2.0–3.0)

## 2019-11-17 NOTE — Progress Notes (Signed)
Return in 3 months for blood work with OV 3 days later.

## 2019-11-18 ENCOUNTER — Other Ambulatory Visit: Payer: BC Managed Care – PPO

## 2019-11-18 DIAGNOSIS — E1159 Type 2 diabetes mellitus with other circulatory complications: Secondary | ICD-10-CM

## 2019-11-18 DIAGNOSIS — E1169 Type 2 diabetes mellitus with other specified complication: Secondary | ICD-10-CM

## 2019-11-18 DIAGNOSIS — E039 Hypothyroidism, unspecified: Secondary | ICD-10-CM

## 2019-11-18 DIAGNOSIS — E785 Hyperlipidemia, unspecified: Secondary | ICD-10-CM

## 2019-11-18 DIAGNOSIS — E119 Type 2 diabetes mellitus without complications: Secondary | ICD-10-CM

## 2019-11-18 DIAGNOSIS — E559 Vitamin D deficiency, unspecified: Secondary | ICD-10-CM

## 2019-11-19 LAB — COMPREHENSIVE METABOLIC PANEL
ALT: 32 IU/L (ref 0–32)
AST: 26 IU/L (ref 0–40)
Albumin/Globulin Ratio: 2.1 (ref 1.2–2.2)
Albumin: 4.8 g/dL (ref 3.8–4.8)
Alkaline Phosphatase: 73 IU/L (ref 39–117)
BUN/Creatinine Ratio: 24 (ref 12–28)
BUN: 17 mg/dL (ref 8–27)
Bilirubin Total: 0.5 mg/dL (ref 0.0–1.2)
CO2: 22 mmol/L (ref 20–29)
Calcium: 9.3 mg/dL (ref 8.7–10.3)
Chloride: 95 mmol/L — ABNORMAL LOW (ref 96–106)
Creatinine, Ser: 0.7 mg/dL (ref 0.57–1.00)
GFR calc Af Amer: 107 mL/min/{1.73_m2} (ref >59)
GFR calc non Af Amer: 93 mL/min/{1.73_m2} (ref >59)
Globulin, Total: 2.3 g/dL (ref 1.5–4.5)
Glucose: 150 mg/dL — ABNORMAL HIGH (ref 65–99)
Potassium: 4 mmol/L (ref 3.5–5.2)
Sodium: 137 mmol/L (ref 134–144)
Total Protein: 7.1 g/dL (ref 6.0–8.5)

## 2019-11-19 LAB — CBC
Hematocrit: 42.8 % (ref 34.0–46.6)
Hemoglobin: 14 g/dL (ref 11.1–15.9)
MCH: 30 pg (ref 26.6–33.0)
MCHC: 32.7 g/dL (ref 31.5–35.7)
MCV: 92 fL (ref 79–97)
Platelets: 222 10*3/uL (ref 150–450)
RBC: 4.66 x10E6/uL (ref 3.77–5.28)
RDW: 13.4 % (ref 11.7–15.4)
WBC: 6.4 10*3/uL (ref 3.4–10.8)

## 2019-11-19 LAB — LIPID PANEL
Chol/HDL Ratio: 2.7 ratio (ref 0.0–4.4)
Cholesterol, Total: 117 mg/dL (ref 100–199)
HDL: 43 mg/dL (ref >39)
LDL Chol Calc (NIH): 51 mg/dL (ref 0–99)
Triglycerides: 130 mg/dL (ref 0–149)
VLDL Cholesterol Cal: 23 mg/dL (ref 5–40)

## 2019-11-19 LAB — T4, FREE: Free T4: 1.57 ng/dL (ref 0.82–1.77)

## 2019-11-19 LAB — VITAMIN D 25 HYDROXY (VIT D DEFICIENCY, FRACTURES): Vit D, 25-Hydroxy: 52.2 ng/mL (ref 30.0–100.0)

## 2019-11-19 LAB — HEMOGLOBIN A1C
Est. average glucose Bld gHb Est-mCnc: 160 mg/dL
Hgb A1c MFr Bld: 7.2 % — ABNORMAL HIGH (ref 4.8–5.6)

## 2019-11-19 LAB — TSH: TSH: 2.01 u[IU]/mL (ref 0.450–4.500)

## 2019-11-22 ENCOUNTER — Encounter: Payer: Self-pay | Admitting: Family Medicine

## 2019-11-22 ENCOUNTER — Other Ambulatory Visit: Payer: Self-pay

## 2019-11-22 ENCOUNTER — Ambulatory Visit: Payer: BC Managed Care – PPO | Admitting: Family Medicine

## 2019-11-22 VITALS — BP 135/75 | HR 85 | Temp 97.7°F | Ht 63.0 in | Wt 171.5 lb

## 2019-11-22 DIAGNOSIS — E1169 Type 2 diabetes mellitus with other specified complication: Secondary | ICD-10-CM | POA: Diagnosis not present

## 2019-11-22 DIAGNOSIS — E1159 Type 2 diabetes mellitus with other circulatory complications: Secondary | ICD-10-CM | POA: Diagnosis not present

## 2019-11-22 DIAGNOSIS — E039 Hypothyroidism, unspecified: Secondary | ICD-10-CM

## 2019-11-22 DIAGNOSIS — I10 Essential (primary) hypertension: Secondary | ICD-10-CM

## 2019-11-22 DIAGNOSIS — K521 Toxic gastroenteritis and colitis: Secondary | ICD-10-CM | POA: Diagnosis not present

## 2019-11-22 DIAGNOSIS — I152 Hypertension secondary to endocrine disorders: Secondary | ICD-10-CM

## 2019-11-22 DIAGNOSIS — E785 Hyperlipidemia, unspecified: Secondary | ICD-10-CM

## 2019-11-22 MED ORDER — JARDIANCE 25 MG PO TABS: 25.0000 mg | ORAL_TABLET | Freq: Every day | ORAL | 0 refills | Status: DC

## 2019-11-22 MED ORDER — LEVOTHYROXINE SODIUM 50 MCG PO TABS: ORAL_TABLET | ORAL | 0 refills | Status: DC

## 2019-11-22 MED ORDER — RAMIPRIL 5 MG PO CAPS: 5.0000 mg | ORAL_CAPSULE | Freq: Every day | ORAL | 0 refills | Status: DC

## 2019-11-22 MED ORDER — METFORMIN HCL ER 500 MG PO TB24: ORAL_TABLET | ORAL | 0 refills | Status: DC

## 2019-11-22 NOTE — Patient Instructions (Addendum)
For control of your allergies, please begin using AYR or Neilmed sinus rinses twice per day (AM and PM), followed by flonase (one spray to each nostril, AM and PM).   You may also incorporate an over the counter allergy medication such as Allegra.    Risk factors for prediabetes and type 2 diabetes  Researchers don't fully understand why some people develop prediabetes and type 2 diabetes and others don't.  It's clear that certain factors increase the risk, however, including:  Weight. The more fatty tissue you have, the more resistant your cells become to insulin.  Inactivity. The less active you are, the greater your risk. Physical activity helps you control your weight, uses up glucose as energy and makes your cells more sensitive to insulin.  Family history. Your risk increases if a parent or sibling has type 2 diabetes.  Race. Although it's unclear why, people of certain races -- including blacks, Hispanics, American Indians and Asian-Americans -- are at higher risk.  Age. Your risk increases as you get older. This may be because you tend to exercise less, lose muscle mass and gain weight as you age. But type 2 diabetes is also increasing dramatically among children, adolescents and younger adults.  Gestational diabetes. If you developed gestational diabetes when you were pregnant, your risk of developing prediabetes and type 2 diabetes later increases. If you gave birth to a baby weighing more than 9 pounds (4 kilograms), you're also at risk of type 2 diabetes.  Polycystic ovary syndrome. For women, having polycystic ovary syndrome -- a common condition characterized by irregular menstrual periods, excess hair growth and obesity -- increases the risk of diabetes.  High blood pressure. Having blood pressure over 140/90 millimeters of mercury (mm Hg) is linked to an increased risk of type 2 diabetes.  Abnormal cholesterol and triglyceride levels. If you have low levels of high-density  lipoprotein (HDL), or "good," cholesterol, your risk of type 2 diabetes is higher. Triglycerides are another type of fat carried in the blood. People with high levels of triglycerides have an increased risk of type 2 diabetes. Your doctor can let you know what your cholesterol and triglyceride levels are.  A good guide to good carbs: The glycemic index ---If you have diabetes, or at risk for diabetes, you know all too well that when you eat carbohydrates, your blood sugar goes up. The total amount of carbs you consume at a meal or in a snack mostly determines what your blood sugar will do. But the food itself also plays a role. A serving of white rice has almost the same effect as eating pure table sugar -- a quick, high spike in blood sugar. A serving of lentils has a slower, smaller effect.  ---Picking good sources of carbs can help you control your blood sugar and your weight. Even if you don't have diabetes, eating healthier carbohydrate-rich foods can help ward off a host of chronic conditions, from heart disease to various cancers to, well, diabetes.  ---One way to choose foods is with the glycemic index (GI). This tool measures how much a food boosts blood sugar.  The glycemic index rates the effect of a specific amount of a food on blood sugar compared with the same amount of pure glucose. A food with a glycemic index of 28 boosts blood sugar only 28% as much as pure glucose. One with a GI of 95 acts like pure glucose.    High glycemic foods result in a quick spike in  insulin and blood sugar (also known as blood glucose).  Low glycemic foods have a slower, smaller effect- these are healthier for you.   Using the glycemic index Using the glycemic index is easy: choose foods in the low GI category instead of those in the high GI category (see below), and go easy on those in between. Low glycemic index (GI of 55 or less): Most fruits and vegetables, beans, minimally processed grains, pasta,  low-fat dairy foods, and nuts.  Moderate glycemic index (GI 56 to 69): White and sweet potatoes, corn, white rice, couscous, breakfast cereals such as Cream of Wheat and Mini Wheats.  High glycemic index (GI of 70 or higher): White bread, rice cakes, most crackers, bagels, cakes, doughnuts, croissants, most packaged breakfast cereals. You can see the values for 100 commons foods and get links to more at www.[email or web address].si.  Swaps for lowering glycemic index  Instead of this high-glycemic index food Eat this lower-glycemic index food  White rice Brown rice or converted rice  Instant oatmeal Steel-cut oats  Cornflakes Bran flakes  Baked potato Pasta, bulgur  White bread Whole-grain bread  Corn Peas or leafy greens       Prediabetes Eating Plan  Prediabetes--also called impaired glucose tolerance or impaired fasting glucose--is a condition that causes blood sugar (blood glucose) levels to be higher than normal. Following a healthy diet can help to keep prediabetes under control. It can also help to lower the risk of type 2 diabetes and heart disease, which are increased in people who have prediabetes. Along with regular exercise, a healthy diet:  Promotes weight loss.  Helps to control blood sugar levels.  Helps to improve the way that the body uses insulin.   WHAT DO I NEED TO KNOW ABOUT THIS EATING PLAN?   Use the glycemic index (GI) to plan your meals. The index tells you how quickly a food will raise your blood sugar. Choose low-GI foods. These foods take a longer time to raise blood sugar.  Pay close attention to the amount of carbohydrates in the food that you eat. Carbohydrates increase blood sugar levels.  Keep track of how many calories you take in. Eating the right amount of calories will help you to achieve a healthy weight. Losing about 7 percent of your starting weight can help to prevent type 2 diabetes.  You may want to follow a Mediterranean diet. This  diet includes a lot of vegetables, lean meats or fish, whole grains, fruits, and healthy oils and fats.   WHAT FOODS CAN I EAT?  Grains Whole grains, such as whole-wheat or whole-grain breads, crackers, cereals, and pasta. Unsweetened oatmeal. Bulgur. Barley. Quinoa. Brown rice. Corn or whole-wheat flour tortillas or taco shells. Vegetables Lettuce. Spinach. Peas. Beets. Cauliflower. Cabbage. Broccoli. Carrots. Tomatoes. Squash. Eggplant. Herbs. Peppers. Onions. Cucumbers. Brussels sprouts. Fruits Berries. Bananas. Apples. Oranges. Grapes. Papaya. Mango. Pomegranate. Kiwi. Grapefruit. Cherries. Meats and Other Protein Sources Seafood. Lean meats, such as chicken and [place] or lean cuts of pork and beef. Tofu. Eggs. Nuts. Beans. Dairy Low-fat or fat-free dairy products, such as yogurt, cottage cheese, and cheese. Beverages Water. Tea. Coffee. Sugar-free or diet soda. Seltzer water. Milk. Milk alternatives, such as soy or almond milk. Condiments Mustard. Relish. Low-fat, low-sugar ketchup. Low-fat, low-sugar barbecue sauce. Low-fat or fat-free mayonnaise. Sweets and Desserts Sugar-free or low-fat pudding. Sugar-free or low-fat ice cream and other frozen treats. Fats and Oils Avocado. Walnuts. Olive oil. The items listed above may not be a complete list  of recommended foods or beverages. Contact your dietitian for more options.    WHAT FOODS ARE NOT RECOMMENDED?  Grains Refined white flour and flour products, such as bread, pasta, snack foods, and cereals. Beverages Sweetened drinks, such as sweet iced tea and soda. Sweets and Desserts Baked goods, such as cake, cupcakes, pastries, cookies, and cheesecake. The items listed above may not be a complete list of foods and beverages to avoid. Contact your dietitian for more information.   This information is not intended to replace advice given to you by your health care provider. Make sure you discuss any questions you have with your  health care provider.   Document Released: 11/28/2014 Document Reviewed: 11/28/2014 Elsevier Interactive Patient Education [employer].    Your goal blood pressure should be around 135/85 or less on a regular basis.    However, if you are 54 years old or greater, then higher blood pressures are often tolerated based on your medical conditions.  Please follow the recommendations discussed with you by your primary care provider and/or cardiologist.       Hypertension Hypertension, commonly called high blood pressure, is when the force of blood pumping through the arteries is too strong. The arteries are the blood vessels that carry blood from the heart throughout the body. Hypertension forces the heart to work harder to pump blood and may cause arteries to become narrow or stiff. Having untreated or uncontrolled hypertension can cause heart attacks, strokes, kidney disease, and other problems. A blood pressure reading consists of a higher number over a lower number. Ideally, your blood pressure should be below 120/80. The first ("top") number is called the systolic pressure. It is a measure of the pressure in your arteries as your heart beats. The second ("bottom") number is called the diastolic pressure. It is a measure of the pressure in your arteries as the heart relaxes. What are the causes? The cause of this condition is not known. What increases the risk? Some risk factors for high blood pressure are under your control. Others are not. Factors you can change  Smoking.  Having type 2 diabetes mellitus, high cholesterol, or both.  Not getting enough exercise or physical activity.  Being overweight.  Having too much fat, sugar, calories, or salt (sodium) in your diet.  Drinking too much alcohol. Factors that are difficult or impossible to change  Having chronic kidney disease.  Having a family history of high blood pressure.  Age. Risk increases with age.  Race. You  may be at higher risk if you are African-American.  Gender. Men are at higher risk than women before age 55. After age 75, women are at higher risk than men.  Having obstructive sleep apnea.  Stress. What are the signs or symptoms? Extremely high blood pressure (hypertensive crisis) may cause:  Headache.  Anxiety.  Shortness of breath.  Nosebleed.  Nausea and vomiting.  Severe chest pain.  Jerky movements you cannot control (seizures).  How is this diagnosed? This condition is diagnosed by measuring your blood pressure while you are seated, with your arm resting on a surface. The cuff of the blood pressure monitor will be placed directly against the skin of your upper arm at the level of your heart. It should be measured at least twice using the same arm. Certain conditions can cause a difference in blood pressure between your right and left arms. Certain factors can cause blood pressure readings to be lower or higher than normal (elevated) for  a short period of time:  When your blood pressure is higher when you are in a health care provider's office than when you are at home, this is called white coat hypertension. Most people with this condition do not need medicines.  When your blood pressure is higher at home than when you are in a health care provider's office, this is called masked hypertension. Most people with this condition may need medicines to control blood pressure.  If you have a high blood pressure reading during one visit or you have normal blood pressure with other risk factors:  You may be asked to return on a different day to have your blood pressure checked again.  You may be asked to monitor your blood pressure at home for 1 week or longer.  If you are diagnosed with hypertension, you may have other blood or imaging tests to help your health care provider understand your overall risk for other conditions. How is this treated? This condition is treated by  making healthy lifestyle changes, such as eating healthy foods, exercising more, and reducing your alcohol intake. Your health care provider may prescribe medicine if lifestyle changes are not enough to get your blood pressure under control, and if:  Your systolic blood pressure is above 130.  Your diastolic blood pressure is above 80.  Your personal target blood pressure may vary depending on your medical conditions, your age, and other factors. Follow these instructions at home: Eating and drinking  Eat a diet that is high in fiber and potassium, and low in sodium, added sugar, and fat. An example eating plan is called the DASH (Dietary Approaches to Stop Hypertension) diet. To eat this way: ? Eat plenty of fresh fruits and vegetables. Try to fill half of your plate at each meal with fruits and vegetables. ? Eat whole grains, such as whole wheat pasta, brown rice, or whole grain bread. Fill about one quarter of your plate with whole grains. ? Eat or drink low-fat dairy products, such as skim milk or low-fat yogurt. ? Avoid fatty cuts of meat, processed or cured meats, and poultry with skin. Fill about one quarter of your plate with lean proteins, such as fish, chicken without skin, beans, eggs, and tofu. ? Avoid premade and processed foods. These tend to be higher in sodium, added sugar, and fat.  Reduce your daily sodium intake. Most people with hypertension should eat less than 1,500 mg of sodium a day.  Limit alcohol intake to no more than 1 drink a day for nonpregnant women and 2 drinks a day for men. One drink equals 12 oz of beer, 5 oz of wine, or 1 oz of hard liquor. Lifestyle  Work with your health care provider to maintain a healthy body weight or to lose weight. Ask what an ideal weight is for you.  Get at least 30 minutes of exercise that causes your heart to beat faster (aerobic exercise) most days of the week. Activities may include walking, swimming, or biking.  Include  exercise to strengthen your muscles (resistance exercise), such as pilates or lifting weights, as part of your weekly exercise routine. Try to do these types of exercises for 30 minutes at least 3 days a week.  Do not use any products that contain nicotine or tobacco, such as cigarettes and e-cigarettes. If you need help quitting, ask your health care provider.  Monitor your blood pressure at home as told by your health care provider.  Keep all follow-up visits  as told by your health care provider. This is important. Medicines  Take over-the-counter and prescription medicines only as told by your health care provider. Follow directions carefully. Blood pressure medicines must be taken as prescribed.  Do not skip doses of blood pressure medicine. Doing this puts you at risk for problems and can make the medicine less effective.  Ask your health care provider about side effects or reactions to medicines that you should watch for. Contact a health care provider if:  You think you are having a reaction to a medicine you are taking.  You have headaches that keep coming back (recurring).  You feel dizzy.  You have swelling in your ankles.  You have trouble with your vision. Get help right away if:  You develop a severe headache or confusion.  You have unusual weakness or numbness.  You feel faint.  You have severe pain in your chest or abdomen.  You vomit repeatedly.  You have trouble breathing. Summary  Hypertension is when the force of blood pumping through your arteries is too strong. If this condition is not controlled, it may put you at risk for serious complications.  Your personal target blood pressure may vary depending on your medical conditions, your age, and other factors. For most people, a normal blood pressure is less than 120/80.  Hypertension is treated with lifestyle changes, medicines, or a combination of both. Lifestyle changes include weight loss, eating a  healthy, low-sodium diet, exercising more, and limiting alcohol. This information is not intended to replace advice given to you by your health care provider. Make sure you discuss any questions you have with your health care provider. Document Released: 07/14/2005 Document Revised: 06/11/2016 Document Reviewed: 06/11/2016 Elsevier Interactive Patient Education  2018 [employer].    How to Take Your Blood Pressure   Blood pressure is a measurement of how strongly your blood is pressing against the walls of your arteries. Arteries are blood vessels that carry blood from your heart throughout your body. Your health care provider takes your blood pressure at each office visit. You can also take your own blood pressure at home with a blood pressure machine. You may need to take your own blood pressure:  To confirm a diagnosis of high blood pressure (hypertension).  To monitor your blood pressure over time.  To make sure your blood pressure medicine is working.  Supplies needed: To take your blood pressure, you will need a blood pressure machine. You can buy a blood pressure machine, or blood pressure monitor, at most drugstores or online. There are several types of home blood pressure monitors. When choosing one, consider the following:  Choose a monitor that has an arm cuff.  Choose a monitor that wraps snugly around your upper arm. You should be able to fit only one finger between your arm and the cuff.  Do not choose a monitor that measures your blood pressure from your wrist or finger.  Your health care provider can suggest a reliable monitor that will meet your needs. How to prepare To get the most accurate reading, avoid the following for 30 minutes before you check your blood pressure:  Drinking caffeine.  Drinking alcohol.  Eating.  Smoking.  Exercising.  Five minutes before you check your blood pressure:  Empty your bladder.  Sit quietly without talking in a dining  chair, rather than in a soft couch or armchair.  How to take your blood pressure To check your blood pressure, follow the instructions  in the manual that came with your blood pressure monitor. If you have a digital blood pressure monitor, the instructions may be as follows: 1. Sit up straight. 2. Place your feet on the floor. Do not cross your ankles or legs. 3. Rest your left arm at the level of your heart on a table or desk or on the arm of a chair. 4. Pull up your shirt sleeve. 5. Wrap the blood pressure cuff around the upper part of your left arm, 1 inch (2.5 cm) above your elbow. It is best to wrap the cuff around bare skin. 6. Fit the cuff snugly around your arm. You should be able to place only one finger between the cuff and your arm. 7. Position the cord inside the groove of your elbow. 8. Press the power button. 9. Sit quietly while the cuff inflates and deflates. 10. Read the digital reading on the monitor screen and write it down (record it). 11. Wait 2-3 minutes, then repeat the steps, starting at step 1.  What does my blood pressure reading mean? A blood pressure reading consists of a higher number over a lower number. Ideally, your blood pressure should be below 120/80. The first ("top") number is called the systolic pressure. It is a measure of the pressure in your arteries as your heart beats. The second ("bottom") number is called the diastolic pressure. It is a measure of the pressure in your arteries as the heart relaxes. Blood pressure is classified into four stages. The following are the stages for adults who do not have a short-term serious illness or a chronic condition. Systolic pressure and diastolic pressure are measured in a unit called mm Hg. Normal  Systolic pressure: below 123456.  Diastolic pressure: below 80. Elevated  Systolic pressure: [id number].  Diastolic pressure: below 80. Hypertension stage 1  Systolic pressure: 0000000.  Diastolic pressure:  [id number]. Hypertension stage 2  Systolic pressure: [id number] or above.  Diastolic pressure: 90 or above. You can have prehypertension or hypertension even if only the systolic or only the diastolic number in your reading is higher than normal. Follow these instructions at home:  Check your blood pressure as often as recommended by your health care provider.  Take your monitor to the next appointment with your health care provider to make sure: ? That you are using it correctly. ? That it provides accurate readings.  Be sure you understand what your goal blood pressure numbers are.  Tell your health care provider if you are having any side effects from blood pressure medicine. Contact a health care provider if:  Your blood pressure is consistently high. Get help right away if:  Your systolic blood pressure is higher than 180.  Your diastolic blood pressure is higher than 110. This information is not intended to replace advice given to you by your health care provider. Make sure you discuss any questions you have with your health care provider. Document Released: 12/21/2015 Document Revised: 03/04/2016 Document Reviewed: 12/21/2015 Elsevier Interactive Patient Education  [employer].

## 2019-11-22 NOTE — Progress Notes (Signed)
Assessment and plan:  1. Type 2 diabetes mellitus with other specified complication, without long-term current use of insulin ([place])   2. Hyperlipidemia associated with type 2 diabetes mellitus ([place])   3. Hypertension associated with diabetes ([place])   4. Hypothyroidism, unspecified type   5. Diarrhea due to drug- metformin     - Discussed that Dr. Debara Pickett will continue to refill patient's cardiological medications as discussed.   Environmental & Seasonal Allergies - Advised the patient to begin using AYR or Neilmed sinus rinses BID followed by flonase BID (one spray to each nostril).  Advised that the patient may also incorporate Allegra OTC.  - To help reduce exposure to allergens, advised patient to come in and shower after being outside, engaging in yard work, etc.  - Will continue to monitor.   Lab Review - Reviewed recent lab work (11/18/2019) in depth with patient today.  All lab work within normal limits unless otherwise noted.  Extensive education provided and all questions answered.  - To help preserve overall organ health, advised patient to continue with healthier dietary habits as established.  Kidney Function - Discussed critical importance of adequate hydration to help preserve and improve kidney function.  - Advised patient to maintain control over her blood sugar and blood pressure, engage in regular physical activity, and avoid nephrotoxic substances.   Type 2 DM with other specified complication, without long-term current use of insulin - A1c increased to 7.2 from 6.9 prior.  - A1c most recently is elevated above goal. - Per patient, over past month, fasting blood sugars controlled under 140 at home.  - Pt will continue current treatment regimen and re-check in 3 months. - Continue prudent dietary habits as established under guidance of nutritionist.  - Counseled patient on pathophysiology of  disease and discussed various treatment options, which always includes dietary and lifestyle modification as first line.    - Importance of low carb, heart-healthy diet discussed with patient in addition to regular aerobic exercise of 40mn 5d/week or more.   - Check FBS and 2 hours after the biggest meal of your day.  Keep log and bring in next OV for my review.     - Also told patient if you ever feel poorly, please check your blood pressure and blood sugar, as one or the other could be the cause of your symptoms.  - Pt reminded about need for yearly eye and foot exams.  Told patient to make appt.for diabetic eye exam, CMAs here will do foot exams  - Handout provided today regarding the glycemic index of foods.  - We will continue to monitor.   Hyperlipidemia associated with type 2 diabetes mellitus - Last FLP obtained 4 days ago:  Triglycerides = 130, down from 165 prior. HDL = 43, down from 48 prior. LDL = 51, at goal.  - Cholesterol levels are at goal. - Pt will continue current treatment regimen.  See med list.  - Dietary changes such as low saturated & trans fat diets for hyperlipidemia and low carb/ ketogenic diets for hypertriglyceridemia discussed with patient.    - Encouraged patient to follow AHA guidelines for regular exercise and also engage in weight loss if BMI above 25.   - We will continue to monitor alongside cardiologist, and re-check as discussed.   Hypertension associated with diabetes - Blood pressure elevated on intake today. - Per patient, BP controlled at home.  - Patient will continue current treatment regimen.  See med list.  - Counseled patient on pathophysiology of disease and discussed various treatment options, which always includes dietary and lifestyle modification as first line.   - Lifestyle changes such as dash and heart healthy diets and engaging in a regular exercise program discussed extensively with patient.   - Ambulatory blood  pressure monitoring encouraged at least 3 times weekly.  Keep log and bring in every office visit.  Reminded patient that if they ever feel poorly in any way, to check their blood pressure and pulse.  - We will continue to monitor alongside cardiologist.   Hypothyroidism, unspecified type - Stable at this time. - Continue management as established.  See med list. - Will continue to monitor.   Health Counseling & Preventative Maintenance - Advised patient to continue working toward exercising to improve overall mental, physical, and emotional health.    - Reviewed the "spokes of the wheel" of wellbeing.  Stressed the importance of ongoing prudent habits, including regular exercise, appropriate sleep hygiene, healthful dietary habits, and prayer/meditation to relax.  - Encouraged patient to engage in daily physical activity as tolerated, especially a formal exercise routine.  Recommended that the patient eventually strive for at least 150 minutes of moderate cardiovascular activity per week according to guidelines established by the [facility].   - Healthy dietary habits encouraged, including low-carb, and high amounts of lean protein in diet.   - Patient should also consume adequate amounts of water.  - Health counseling performed.  All questions answered.    Education and routine counseling performed. Handouts provided.   Meds ordered this encounter  Medications  . JARDIANCE 25 MG TABS tablet    Sig: Take 25 mg by mouth daily.    Dispense:  90 tablet    Refill:  0  . levothyroxine (SYNTHROID) 50 MCG tablet    Sig: TAKE 1 TABLET BY MOUTH DAILY BEFORE BREAKFAST.    Dispense:  90 tablet    Refill:  0  . metFORMIN (GLUCOPHAGE-XR) 500 MG 24 hr tablet    Sig: Take 1 tablet by mouth in the morning and 2 tablets at night.    Dispense:  90 tablet    Refill:  0  . ramipril (ALTACE) 5 MG capsule    Sig: Take 1 capsule (5 mg total) by mouth daily.    Dispense:  90 capsule    Refill:  0     Medications Discontinued During This Encounter  Medication Reason  . metFORMIN (GLUCOPHAGE-XR) 500 MG 24 hr tablet Reorder  . ramipril (ALTACE) 5 MG capsule Reorder  . levothyroxine (SYNTHROID) 50 MCG tablet Reorder  . JARDIANCE 25 MG TABS tablet Reorder       Return for f/up in 3 months for re-check A1c, DM, chronic conditions.   Anticipatory guidance and routine counseling done re: condition, txmnt options and need for follow up. All questions of patient's were answered.   Please see AVS handed out to patient at the end of our visit for additional patient instructions/ counseling done pertaining to today's office visit.  Note:  This document was prepared using Dragon voice recognition software and may include unintentional dictation errors.  The [place] was signed into law in 2016 which includes the topic of electronic health records.  This provides immediate access to information in MyChart.  This includes consultation notes, operative notes, office notes, lab results and pathology reports.  If you have any questions about what you read please let us know at  your next visit or call us at the office.  We are right here with you.   This case required medical decision making of at least moderate complexity.  This document serves as a record of services personally performed by Mellody Dance, DO. It was created on her behalf by Toni Amend, a trained medical scribe. The creation of this record is based on the scribe's personal observations and the provider's statements to them.    The above documentation from Toni Amend, medical scribe, has been reviewed by Marjory Sneddon, D.O.     ----------------------------------------------------------------------------------------------------------------------  Subjective:     Phillips Odor, am serving as scribe for Dr.Opalski.    CC:   GAE BIHL is a 64 y.o. female who presents to  [place] at [facility] today for review and discussion of recent bloodwork that was done in addition to f/up on chronic conditions we are managing for pt.  1. All recent blood work that we ordered was reviewed with patient today.  Patient was counseled on all abnormalities and we discussed dietary and lifestyle changes that could help those values (also medications when appropriate).  Extensive health counseling performed and all patient's concerns/ questions were addressed.    - Seasonal Allergies Says "suffering these last few weeks with pollen," and has developed a cough.  - Increase in Blood Sugars Patient has been working closely with a nutritionist regarding her eating habits.  Notes her sugars kept going up and up, and she kept trying the nutritionist's dietary suggestions, but her fasting sugars stayed high.  They discussed that it's unlikely to be due to her diet, and more likely due to the patient's stress levels.  Notes she was drinking a lot of Diet Pepsi, but she hasn't had but one Diet Pepsi in a month.   - Caregiver Stress To help reduce stress, patient was recently taken to the beach with her children and grandchildren.  Says the next morning after she got to the beach, her blood sugar dropped 40 points.  Says over the last month, after her trip to the beach, her outlook changed, her fasting sugars dropped, and "it clicked with me."   - Exercise / Activity Habits She exercises every morning, noting she "makes sure to walk for 30 minutes every morning."  Says "even when I'm brushing my teeth, I walk."  Says she starts being active in the house, and then she takes the dog for a walk outside.  She also continues going to school to work.  Believes getting her COVID-19 shot has helped with her outlook / activity levels, too.  - Vitamin D Deficiency Taking 1000 IU's Vitamin D OTC.  HPI:   Diabetes Mellitus:  Home glucose readings:  Average has been 137 for the  past month, after he trip to the beach. She occasionally has a 160-170, and notes "only two or maybe three days in the past month where my fasting blood sugar was over 140."  Says she was burned out with trying, and "the more I stressed with the numbers, the higher they got."   - Patient reports good compliance with therapy plan: medication and/or lifestyle modification  - Her denies acute concerns or problems related to treatment plan  - She denies new concerns.  Denies polyuria/polydipsia, hypo/ hyperglycemia symptoms.  Denies new onset of: chest pain, exercise intolerance, shortness of breath, dizziness, visual changes, headache, lower extremity swelling or claudication.   Last A1C in the office was:  Lab Results  Component Value Date   HGBA1C 7.2 (H) 11/18/2019   HGBA1C 6.9 (H) 03/08/2019   HGBA1C 6.9 (A) 03/08/2019   Lab Results  Component Value Date   LDLCALC 51 11/18/2019   CREATININE 0.70 11/18/2019   BP Readings from Last 3 Encounters:  11/22/19 135/75  09/28/19 (!) 148/70  06/08/19 (!) 132/58   Wt Readings from Last 3 Encounters:  11/22/19 171 lb 8 oz (77.8 kg)  09/28/19 170 lb (77.1 kg)  06/08/19 165 lb (74.8 kg)   HPI:  Hyperlipidemia:  64 y.o. female here for cholesterol follow-up.   - Patient reports good compliance with treatment plan of:  medication and/ or lifestyle management.    - Patient denies any acute concerns or problems with management plan   - She denies new onset of: myalgias, arthralgias, increased fatigue more than normal, chest pains, exercise intolerance, shortness of breath, dizziness, visual changes, headache, lower extremity swelling or claudication.   Most recent cholesterol panel was:  Lab Results  Component Value Date   CHOL 117 11/18/2019   HDL 43 11/18/2019   LDLCALC 51 11/18/2019   TRIG 130 11/18/2019   CHOLHDL 2.7 11/18/2019   Hepatic Function Latest Ref Rng & Units 11/18/2019 03/08/2019 09/15/2018  Total Protein 6.0 - 8.5 g/dL  7.1 6.9 6.9  Albumin 3.8 - 4.8 g/dL 4.8 4.9(H) 4.7  AST 0 - 40 IU/L _0 ALT 0 - 32 IU/L 32 34(H) 28  Alk Phosphatase 39 - 117 IU/L 73 52 63  Total Bilirubin 0.0 - 1.2 mg/dL 0.5 0.4 0.3    HPI:  Hypertension:  -  Her blood pressure at home has been running: 136/70, "just fine at home."  Says her BP is "never good" in the office.  - Patient reports good compliance with medication and/or lifestyle modification  - Her denies acute concerns or problems related to treatment plan  - She denies new onset of: chest pain, exercise intolerance, shortness of breath, dizziness, visual changes, headache, lower extremity swelling or claudication.   Last 3 blood pressure readings in our office are as follows: BP Readings from Last 3 Encounters:  11/22/19 135/75  09/28/19 (!) 148/70  06/08/19 (!) 132/58   Filed Weights   11/22/19 0911  Weight: 171 lb 8 oz (77.8 kg)        Wt Readings from Last 3 Encounters:  11/22/19 171 lb 8 oz (77.8 kg)  09/28/19 170 lb (77.1 kg)  06/08/19 165 lb (74.8 kg)   BP Readings from Last 3 Encounters:  11/22/19 135/75  09/28/19 (!) 148/70  06/08/19 (!) 132/58   Pulse Readings from Last 3 Encounters:  11/22/19 85  09/28/19 83  04/01/19 76   BMI Readings from Last 3 Encounters:  11/22/19 30.38 kg/m  09/28/19 30.11 kg/m  06/08/19 29.23 kg/m     Patient Care Team    Relationship Specialty Notifications Start End  Mellody Dance, DO PCP - General Family Medicine  01/05/19   Pixie Casino, MD PCP - Cardiology Cardiology Admissions 03/05/18     Full medical history updated and reviewed in the office today  Patient Active Problem List   Diagnosis Date Noted  . Hyperlipidemia associated with type 2 diabetes mellitus ([place]) 02/03/2018  . 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 Controlled type 2 diabetes mellitus without complication ([place]) 62/70/3500  . Hypertension associated with diabetes ([place]) 01/26/2013  . Hypothyroidism 02/03/2018  . 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  DVT (deep venous thrombosis) ([place]) 01/26/2013  . Pulmonary embolus ([place]) 01/26/2013  . Vitamin D insufficiency 09/13/2018  . S/P AVR 01/26/2013  . Long term (current) use of anticoagulants 10/11/2012  . Uncontrolled type 2 diabetes mellitus with hyperglycemia ([place]) 08/10/2019  . Diarrhea due to drug- metformin 06/08/2019  . Caregiver stress syndrome 03/08/2019  . White coat syndrome with diagnosis of hypertension 09/13/2018  . Acute maxillary sinusitis 04/19/2018  . LBBB (left bundle branch block) 04/06/2014  . Aortic stenosis 01/26/2013  . 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 Cardiomyopathy, ischemic 01/26/2013    Past Medical History:  Diagnosis Date  . 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 Clotting disorder (HCC)    anticoagulant - coumadin hx DVT, PE  . Coronary artery disease    s/p 3- vessel bypass   . Diabetes mellitus without complication ([place])   . DVT (deep venous thrombosis) ([place])   . Heart murmur   . Hyperlipidemia   . Hypertension   . Ischemic cardiomyopathy    40 to 45%  . Left bundle branch block   . Mechanical heart valve present    Jan 2012  . Popliteal artery occlusion, left (HCC)    recent  status  post angioplasty  . Pulmonary embolism ([place])    associated with DVT  . Pulmonary embolus ([place])   . Thyroid disease    hyperthyroidism    Past Surgical History:  Procedure Laterality Date  . AORTIC VALVE REPLACEMENT (AVR)/CORONARY ARTERY BYPASS GRAFTING (CABG)  08/16/2010  VAN TRIGT   AV replacement w/19-mm mechanical St Jude valve ([id number]),CABGX3  lima to LAD, SVG to distal circ,SVG to posterior descending  . ASD REPAIR    . CARDIAC CATHETERIZATION  08/08/2010   ,LAD 50% PROX,605 TANDEM SEGMENTAL STENOSIS,LEFT CIRC 60%-AV GROOVE, RGT COR 40% PROX ON THE BEND ,80% PROX PDA,  . CORONARY ARTERY BYPASS GRAFT  08/16/2010    LIMA TO lad,svg to distal circ ,svg to posterior descending  . DOPPLER ECHOCARDIOGRAPHY  08/09/2010   EF35% to 40% ,bbicuspid ;severely  thickened ,severely calcified leaflets, LV normal  . LEA doppler  08/07/2010   left popliteal occlusion ,and anterior tibial complete occlusion  . NM MYOCAR PERF WALL MOTION  08/06/2010   EF 31%  LOW RISK SCAN  . nuc    . PV ANGIOGRAM  08/08/2010   successful PTA LEFT POPLITEAL    Social History   Tobacco Use  . Smoking status: Never Smoker  . Smokeless tobacco: Never Used  Substance Use Topics  . Alcohol use: Yes    Alcohol/week: 7.0 standard drinks    Types: 7 Standard drinks or equivalent per week    Family Hx: Family History  Problem Relation Age of Onset  . Cancer Mother   . Cancer Father   . CAD Father   . Heart attack Father 5       first MI, 2 major after that, multiple silent MIs  . Stroke Brother   . CAD Brother 24       first stent  . Heart attack Brother   . CAD Brother 40       CABG   . Hypertension Brother   . Heart attack Brother 23       died from MI  . Colon cancer Neg Hx   . Esophageal cancer Neg Hx   . Rectal cancer Neg Hx   . Stomach  cancer Neg Hx      Medications: Current Outpatient Medications  Medication Sig Dispense Refill  . Alirocumab (PRALUENT) 150 MG/ML SOAJ Inject 1 Dose into the skin every 14 (fourteen) days. 6 pen 11  . aspirin EC 81 MG tablet Take 81 mg by mouth daily.    . furosemide (LASIX) 40 MG tablet TAKE 1/2 TABLET BY MOUTH DAILY. 45 tablet 2  . JARDIANCE 25 MG TABS tablet Take 25 mg by mouth daily. 90 tablet 0  . levothyroxine (SYNTHROID) 50 MCG tablet TAKE 1 TABLET BY MOUTH DAILY BEFORE BREAKFAST. 90 tablet 0  . metFORMIN (GLUCOPHAGE-XR) 500 MG 24 hr tablet Take 1 tablet by mouth in the morning and 2 tablets at night. 90 tablet 0  . metoprolol succinate (TOPROL-XL) 100 MG 24 hr tablet TAKE 1 TABLET BY MOUTH DAILY. TAKE WITH OR IMMEDIATELY FOLLOWING A MEAL. 90 tablet 2  . ramipril  (ALTACE) 5 MG capsule Take 1 capsule (5 mg total) by mouth daily. 90 capsule 0  . rosuvastatin (CRESTOR) 40 MG tablet TAKE 1 TABLET BY MOUTH DAILY. 90 tablet 2  . VITAMIN D PO Take by mouth.    . warfarin (COUMADIN) 2 MG tablet TAKE 1 TO 1 AND 1/2 TABLETS BY MOUTH DAILY AS DIRECTED BY COUMADIN CLINIC. 120 tablet 1   No current facility-administered medications for this visit.    Allergies:  Allergies  Allergen Reactions  . Niaspan [Niacin Er] Other (See Comments)    Flushing, even with ASA     Review of Systems: General:   No F/C, wt loss Pulm:   No DIB, SOB, pleuritic chest pain Card:  No CP, palpitations Abd:  No n/v/d or pain Ext:  No inc edema from baseline  Objective:  Blood pressure 135/75, pulse 85, temperature 97.7 F (36.5 C), temperature source Oral, height 5' 3" (1.6 m), weight 171 lb 8 oz (77.8 kg), SpO2 99 %. Body mass index is 30.38 kg/m. Gen:   Well NAD, A and O *3 HEENT:    [place]/AT, EOMI,  MMM Lungs:   Normal work of breathing. CTA B/L, no Wh, rhonchi Heart:   RRR, S1, S2 WNL's, no MRG Abd:   No gross distention Exts:    warm, pink,  Brisk capillary refill, warm and well perfused.  Psych:    No HI/SI, judgement and insight good, Euthymic mood. Full Affect.   Recent Results (from the past 2160 hour(s))  POCT INR     Status: Abnormal   Collection Time: 08/29/19  7:56 AM  Result Value Ref Range   INR 3.7 (A) 2.0 - 3.0  POCT INR     Status: None   Collection Time: 09/28/19  8:28 AM  Result Value Ref Range   INR 2.3 2.0 - 3.0  POCT INR     Status: None   Collection Time: 11/01/19  8:05 AM  Result Value Ref Range   INR 2.1 2.0 - 3.0  POCT INR     Status: None   Collection Time: 11/17/19  7:43 AM  Result Value Ref Range   INR 2.9 2.0 - 3.0  VITAMIN D 25 Hydroxy (Vit-D Deficiency, Fractures)     Status: None   Collection Time: 11/18/19  8:13 AM  Result Value Ref Range   Vit D, 25-Hydroxy 52.2 30.0 - 100.0 ng/mL    Comment: Vitamin D deficiency has been  defined by the [place] practice guideline as a level of serum 25-OH vitamin  D less than 20 ng/mL (1,2). The Endocrine Society went on to further define vitamin D insufficiency as a level between 21 and 29 ng/mL (2). 1. IOM (Institute of Medicine). 2010. Dietary reference    intakes for calcium and D. [place]: The    [employer]. 2. Holick MF, Binkley [place], Bischoff-Ferrari HA, et al.    Evaluation, treatment, and prevention of vitamin D    deficiency: an Endocrine Society clinical practice    guideline. JCEM. 2011 Jul; 96(7):1911-30.   Lipid panel     Status: None   Collection Time: 11/18/19  8:13 AM  Result Value Ref Range   Cholesterol, Total 117 100 - 199 mg/dL   Triglycerides 130 0 - 149 mg/dL   HDL 43 >39 mg/dL   VLDL Cholesterol Cal 23 5 - 40 mg/dL   LDL Chol Calc (NIH) 51 0 - 99 mg/dL   Chol/HDL Ratio 2.7 0.0 - 4.4 ratio    Comment:                                   T. Chol/HDL Ratio                                             Men  Women                               1/2 Avg.Risk  3.4    3.3                                   Avg.Risk  5.0    4.4                                2X Avg.Risk  9.6    7.1                                3X Avg.Risk 23.4   11.0   Hemoglobin A1c     Status: Abnormal   Collection Time: 11/18/19  8:13 AM  Result Value Ref Range   Hgb A1c MFr Bld 7.2 (H) 4.8 - 5.6 %    Comment:          Prediabetes: 5.7 - 6.4          Diabetes: >6.4          Glycemic control for adults with diabetes: <7.0    Est. average glucose Bld gHb Est-mCnc 160 mg/dL  T4, free     Status: None   Collection Time: 11/18/19  8:13 AM  Result Value Ref Range   Free T4 1.57 0.82 - 1.77 ng/dL  TSH     Status: None   Collection Time: 11/18/19  8:13 AM  Result Value Ref Range   TSH 2.010 0.450 - 4.500 uIU/mL  Comprehensive metabolic panel     Status: Abnormal   Collection Time: 11/18/19  8:13 AM  Result Value Ref Range    Glucose 150 (H) 65 - 99 mg/dL   BUN 17 8 - 27 mg/dL   Creatinine,  Ser 0.70 0.57 - 1.00 mg/dL   GFR calc non Af Amer 93 >59 mL/min/1.73   GFR calc Af Amer 107 >59 mL/min/1.73    Comment: **[facility] currently reports eGFR in compliance with the current**   recommendations of the [employer]. [facility] will   update reporting as new guidelines are published from the NKF-ASN   Task force.    BUN/Creatinine Ratio 24 12 - 28   Sodium 137 134 - 144 mmol/L   Potassium 4.0 3.5 - 5.2 mmol/L   Chloride 95 (L) 96 - 106 mmol/L   CO2 22 20 - 29 mmol/L   Calcium 9.3 8.7 - 10.3 mg/dL   Total Protein 7.1 6.0 - 8.5 g/dL   Albumin 4.8 3.8 - 4.8 g/dL   Globulin, Total 2.3 1.5 - 4.5 g/dL   Albumin/Globulin Ratio 2.1 1.2 - 2.2   Bilirubin Total 0.5 0.0 - 1.2 mg/dL   Alkaline Phosphatase 73 39 - 117 IU/L   AST 26 0 - 40 IU/L   ALT 32 0 - 32 IU/L  CBC     Status: None   Collection Time: 11/18/19  8:13 AM  Result Value Ref Range   WBC 6.4 3.4 - 10.8 x10E3/uL   RBC 4.66 3.77 - 5.28 x10E6/uL   Hemoglobin 14.0 11.1 - 15.9 g/dL   Hematocrit 42.8 34.0 - 46.6 %   MCV 92 79 - 97 fL   MCH 30.0 26.6 - 33.0 pg   MCHC 32.7 31.5 - 35.7 g/dL   RDW 13.4 11.7 - 15.4 %   Platelets 222 150 - 450 x10E3/uL

## 2019-11-24 ENCOUNTER — Telehealth: Payer: Self-pay | Admitting: Family Medicine

## 2019-11-24 MED ORDER — AMOXICILLIN 500 MG PO CAPS
1000.0000 mg | ORAL_CAPSULE | Freq: Two times a day (BID) | ORAL | 0 refills | Status: DC
Start: 2019-11-24 — End: 2020-04-25

## 2019-11-24 NOTE — Telephone Encounter (Signed)
Patient called during lunch and left VM, she was seen earlier this week with Dr. Jenetta Downer and they discussed her bad seasonal allergies and was given recommendation of OTC meds/remedies. She said that it has got worse and thinks she may have developed a sinus infection. She is requesting a call back from clinic staff to discuss what she should do.

## 2019-11-24 NOTE — Telephone Encounter (Signed)
If pt does not have any objection to the medicine, the best course of action for her 2+ wks of sx and now, sx acutely W---> Amoxicillin 1000mg  bid for 10 d

## 2019-11-24 NOTE — Addendum Note (Signed)
Addended by: Mickel Crow on: 11/24/2019 04:37 PM   Modules accepted: Orders

## 2019-11-24 NOTE — Telephone Encounter (Signed)
Medication sent to pharmacy per Dr. Raliegh Scarlet message below. Patient is aware and verbalized understanding. AS< CMA

## 2019-11-24 NOTE — Telephone Encounter (Signed)
Patient was seen 11/22/19 and advised to use OTC nasal spray and OTC allergy medication at that time. Patient calling today stating that she has a sinus infection. States that she has left sided facial pain, nasal congestion and cough. Patient is requesting medication to be sent to pharmacy.   [facility].  Please advise. AS, CMA

## 2019-12-30 ENCOUNTER — Other Ambulatory Visit: Payer: Self-pay

## 2019-12-30 ENCOUNTER — Ambulatory Visit (INDEPENDENT_AMBULATORY_CARE_PROVIDER_SITE_OTHER): Payer: BC Managed Care – PPO | Admitting: Pharmacist

## 2019-12-30 DIAGNOSIS — Z952 Presence of prosthetic heart valve: Secondary | ICD-10-CM | POA: Diagnosis not present

## 2019-12-30 DIAGNOSIS — Z7901 Long term (current) use of anticoagulants: Secondary | ICD-10-CM

## 2019-12-30 DIAGNOSIS — I2699 Other pulmonary embolism without acute cor pulmonale: Secondary | ICD-10-CM

## 2019-12-30 LAB — POCT INR: INR: 3.4 — AB (ref 2.0–3.0)

## 2019-12-30 NOTE — Patient Instructions (Signed)
Description   Continue with 1 tablet daily except 1.5 tablets each Monday, Wednesday and Friday.  Repeat INR in 6 weeks

## 2020-01-04 ENCOUNTER — Other Ambulatory Visit: Payer: Self-pay | Admitting: Family Medicine

## 2020-01-04 DIAGNOSIS — E1169 Type 2 diabetes mellitus with other specified complication: Secondary | ICD-10-CM

## 2020-01-09 ENCOUNTER — Other Ambulatory Visit: Payer: Self-pay | Admitting: Physician Assistant

## 2020-01-09 DIAGNOSIS — E1169 Type 2 diabetes mellitus with other specified complication: Secondary | ICD-10-CM

## 2020-01-09 MED ORDER — METFORMIN HCL ER 500 MG PO TB24: ORAL_TABLET | ORAL | 0 refills | Status: DC

## 2020-01-09 NOTE — Telephone Encounter (Signed)
Pt called states Dr.O. normally gives her a 90day supply on Metformin but only 45dys gvn (per pt only 90 pill Rx given & she takes 2 pills daily).  metFORMIN (GLUCOPHAGE-XR) 500 MG 24 hr tablet [[id number]] DISCONTINUED  Order Details Dose: 1,000 mg Route: Oral Frequency: Daily at bedtime  Dispense Quantity: 180 tablet Refills: 0   Indications of Use: Type 2 Diabetes Mellitus      Sig: Take 2 tablets (1,000 mg total) by mouth at bedtime.      Start Date: 06/08/19 End Date: 10/03/19  Discontinued by: Mickel Crow, CMA on 10/03/2019 11:08       Forwarding request that if corrected amount ( (Addt'l Rx refill of 45pills) approved send to  :  [place], [place] Phone:  [phone]  Fax:  [phone]     Pt's scheduled F/u isn't until 02/23/20.   --glh

## 2020-01-27 ENCOUNTER — Other Ambulatory Visit: Payer: Self-pay | Admitting: Internal Medicine

## 2020-02-10 ENCOUNTER — Ambulatory Visit (INDEPENDENT_AMBULATORY_CARE_PROVIDER_SITE_OTHER): Payer: BC Managed Care – PPO | Admitting: Pharmacist

## 2020-02-10 ENCOUNTER — Other Ambulatory Visit: Payer: Self-pay

## 2020-02-10 DIAGNOSIS — Z952 Presence of prosthetic heart valve: Secondary | ICD-10-CM | POA: Diagnosis not present

## 2020-02-10 DIAGNOSIS — Z7901 Long term (current) use of anticoagulants: Secondary | ICD-10-CM

## 2020-02-10 LAB — POCT INR: INR: 3.4 — AB (ref 2.0–3.0)

## 2020-02-21 ENCOUNTER — Encounter: Payer: Self-pay | Admitting: Physician Assistant

## 2020-02-21 ENCOUNTER — Ambulatory Visit (INDEPENDENT_AMBULATORY_CARE_PROVIDER_SITE_OTHER): Payer: BC Managed Care – PPO | Admitting: Physician Assistant

## 2020-02-21 ENCOUNTER — Other Ambulatory Visit: Payer: Self-pay

## 2020-02-21 ENCOUNTER — Other Ambulatory Visit: Payer: Self-pay | Admitting: Physician Assistant

## 2020-02-21 VITALS — BP 117/67 | HR 82 | Temp 97.4°F | Ht 63.0 in | Wt 170.3 lb

## 2020-02-21 DIAGNOSIS — R457 State of emotional shock and stress, unspecified: Secondary | ICD-10-CM | POA: Diagnosis not present

## 2020-02-21 DIAGNOSIS — E1169 Type 2 diabetes mellitus with other specified complication: Secondary | ICD-10-CM | POA: Diagnosis not present

## 2020-02-21 DIAGNOSIS — E1159 Type 2 diabetes mellitus with other circulatory complications: Secondary | ICD-10-CM | POA: Diagnosis not present

## 2020-02-21 DIAGNOSIS — Z951 Presence of aortocoronary bypass graft: Secondary | ICD-10-CM | POA: Diagnosis not present

## 2020-02-21 DIAGNOSIS — E785 Hyperlipidemia, unspecified: Secondary | ICD-10-CM

## 2020-02-21 DIAGNOSIS — I10 Essential (primary) hypertension: Secondary | ICD-10-CM

## 2020-02-21 LAB — POCT GLYCOSYLATED HEMOGLOBIN (HGB A1C): Hemoglobin A1C: 8.1 % — AB (ref 4.0–5.6)

## 2020-02-21 LAB — POCT UA - MICROALBUMIN
Albumin/Creatinine Ratio, Urine, POC: <30
Creatinine, POC: 50 mg/dL
Microalbumin Ur, POC: 10 mg/L

## 2020-02-21 NOTE — Progress Notes (Signed)
Established Patient Office Visit  Subjective:  Patient ID: Katrina Obrien, female    DOB: 10-Feb-1956  Age: 64 y.o. MRN: [id number]  CC:  Chief Complaint  Patient presents with  . Diabetes    HPI  Katrina Obrien presents for follow-up on diabetes mellitus, hypertension, and hyperlipidemia.  Diabetes: Pt denies increased urination or thirst. Pt reports medication compliance. No hypoglycemic events. Checking glucose at home. Fasting blood sugars have been less than 150 for the past 3 weeks. Reports it was 134 this morning. Patient reports increased personal stress as her husband's caregiver, which affects her glucose. States she is hopeful his health will gradually improve since he recently established care here at the office. Denies dietary changes and continues to follow low carbohydrate and glucose diet. Pt worked with nutritionist few months ago.  HTN: Pt denies chest pain, palpitations, dizziness or lower extremity swelling. Taking medication as directed without side effects. Checks BP at home and readings range in 130s/70. Pt follows a low salt diet.  HLD: Followed by cardiology/lipid clinc- Dr. Rennis Golden. Pt taking medication as directed without issues. Her diet consists of lean meat such as chicken, occasionally has red meat and usually tries to get leaner version.   Past Medical History:  Diagnosis Date  . 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 Clotting disorder (HCC)    anticoagulant - coumadin hx DVT, PE  . Coronary artery disease    s/p 3- vessel bypass   . Diabetes mellitus without complication (HCC)   . DVT (deep venous thrombosis) (HCC)   . Heart murmur   . Hyperlipidemia   . Hypertension   . Ischemic cardiomyopathy    40 to 45%  . Left bundle branch block   . Mechanical heart valve present    Jan 2012  . Popliteal artery occlusion, left (HCC)    recent  status  post angioplasty   . Pulmonary embolism (HCC)    associated with DVT  . Pulmonary embolus (HCC)   . Thyroid disease    hyperthyroidism    Past Surgical History:  Procedure Laterality Date  . AORTIC VALVE REPLACEMENT (AVR)/CORONARY ARTERY BYPASS GRAFTING (CABG)  08/16/2010  VAN TRIGT   AV replacement w/19-mm mechanical St Jude valve ([id number]),CABGX3  lima to LAD, SVG to distal circ,SVG to posterior descending  . ASD REPAIR    . CARDIAC CATHETERIZATION  08/08/2010   ,LAD 50% PROX,605 TANDEM SEGMENTAL STENOSIS,LEFT CIRC 60%-AV GROOVE, RGT COR 40% PROX ON THE BEND ,80% PROX PDA,  . CORONARY ARTERY BYPASS GRAFT  08/16/2010   LIMA TO lad,svg to distal circ ,svg to posterior descending  . DOPPLER ECHOCARDIOGRAPHY  08/09/2010   EF35% to 40% ,bbicuspid ;severely  thickened ,severely calcified leaflets, LV normal  . LEA doppler  08/07/2010   left popliteal occlusion ,and anterior tibial complete occlusion  . NM MYOCAR PERF WALL MOTION  08/06/2010   EF 31%  LOW RISK SCAN  . nuc    . PV ANGIOGRAM  08/08/2010   successful PTA LEFT POPLITEAL    Family History  Problem Relation Age of Onset  . Cancer Mother   . Cancer Father   . CAD Father   . Heart attack Father 29       first MI, 2 major after that, multiple silent MIs  . Stroke Brother   . CAD Brother 86       first  stent  . Heart attack Brother   . CAD Brother 54       CABG   . Hypertension Brother   . Heart attack Brother 45       died from MI  . Colon cancer Neg Hx   . Esophageal cancer Neg Hx   . Rectal cancer Neg Hx   . Stomach cancer Neg Hx     Social History   Socioeconomic History  . Marital status: Married    Spouse name: Not on file  . Number of children: Not on file  . Years of education: Not on file  . Highest education level: Not on file  Occupational History  . Not on file  Tobacco Use  . Smoking status: Never Smoker  . Smokeless tobacco: Never Used  Vaping Use  . Vaping Use: Never used  Substance and Sexual Activity   . Alcohol use: Yes    Alcohol/week: 7.0 standard drinks    Types: 7 Standard drinks or equivalent per week  . Drug use: No  . Sexual activity: Not Currently  Other Topics Concern  . Not on file  Social History Narrative  . Not on file   Social Determinants of Health   Financial Resource Strain:   . Difficulty of Paying Living Expenses:   Food Insecurity:   . Worried About Programme researcher, broadcasting/film/video in the Last Year:   . Barista in the Last Year:   Transportation Needs:   . Freight forwarder (Medical):   Marland Kitchen Lack of Transportation (Non-Medical):   Physical Activity:   . Days of Exercise per Week:   . Minutes of Exercise per Session:   Stress:   . Feeling of Stress :   Social Connections:   . Frequency of Communication with Friends and Family:   . Frequency of Social Gatherings with Friends and Family:   . Attends Religious Services:   . Active Member of Clubs or Organizations:   . Attends Banker Meetings:   Marland Kitchen Marital Status:   Intimate Partner Violence:   . Fear of Current or Ex-Partner:   . Emotionally Abused:   Marland Kitchen Physically Abused:   . Sexually Abused:     Outpatient Medications Prior to Visit  Medication Sig Dispense Refill  . Alirocumab (PRALUENT) 150 MG/ML SOAJ Inject 1 Dose into the skin every 14 (fourteen) days. 6 pen 11  . amoxicillin (AMOXIL) 500 MG capsule Take 2 capsules (1,000 mg total) by mouth 2 (two) times daily. 40 capsule 0  . aspirin EC 81 MG tablet Take 81 mg by mouth daily.    . furosemide (LASIX) 40 MG tablet TAKE 1/2 TABLET BY MOUTH DAILY. 45 tablet 2  . metFORMIN (GLUCOPHAGE-XR) 500 MG 24 hr tablet Take 1 tablet by mouth in the morning and 2 tablets at night. 270 tablet 0  . metoprolol succinate (TOPROL-XL) 100 MG 24 hr tablet TAKE 1 TABLET BY MOUTH DAILY. TAKE WITH OR IMMEDIATELY FOLLOWING A MEAL. 90 tablet 2  . rosuvastatin (CRESTOR) 40 MG tablet TAKE 1 TABLET BY MOUTH DAILY. 90 tablet 2  . VITAMIN D PO Take by mouth.    .  warfarin (COUMADIN) 2 MG tablet TAKE 1 TO 1 AND 1/2 TABLETS BY MOUTH DAILY AS DIRECTED BY COUMADIN CLINIC. 120 tablet 1  . JARDIANCE 25 MG TABS tablet Take 25 mg by mouth daily. 90 tablet 0  . levothyroxine (SYNTHROID) 50 MCG tablet TAKE 1 TABLET BY MOUTH DAILY BEFORE  BREAKFAST. 90 tablet 0  . ramipril (ALTACE) 5 MG capsule Take 1 capsule (5 mg total) by mouth daily. 90 capsule 0   No facility-administered medications prior to visit.    Allergies  Allergen Reactions  . Niaspan [Niacin Er] Other (See Comments)    Flushing, even with ASA    ROS Review of Systems  A fourteen system review of systems was performed and found to be positive as per HPI.  Objective:    Physical Exam General:  Well Developed, well nourished, appropriate for stated age.  Neuro:  Alert and oriented,  extra-ocular muscles intact  HEENT:  Normocephalic, atraumatic, neck supple, no carotid bruits appreciated  Skin:  no gross rash, warm, pink. Cardiac:  RRR, S1 S2  Respiratory:  ECTA B/L and A/P, Not using accessory muscles, speaking in full sentences- unlabored. Vascular:  Ext warm, no cyanosis apprec.; cap RF less 2 sec. Psych:  No HI/SI, judgement and insight good, Euthymic mood. Full Affect.  BP 117/67   Pulse 82   Temp (!) 97.4 F (36.3 C) (Oral)   Ht 5\' 3"  (1.6 m)   Wt 170 lb 4.8 oz (77.2 kg)   LMP  (LMP Unknown)   SpO2 97%   BMI 30.17 kg/m  Wt Readings from Last 3 Encounters:  02/21/20 170 lb 4.8 oz (77.2 kg)  11/22/19 171 lb 8 oz (77.8 kg)  09/28/19 170 lb (77.1 kg)     Health Maintenance Due  Topic Date Due  . PNEUMOCOCCAL POLYSACCHARIDE VACCINE AGE 109-64 HIGH RISK  Never done  . COVID-19 Vaccine (1) Never done  . INFLUENZA VACCINE  02/26/2020    There are no preventive care reminders to display for this patient.  Lab Results  Component Value Date   TSH 2.010 11/18/2019   Lab Results  Component Value Date   WBC 6.4 11/18/2019   HGB 14.0 11/18/2019   HCT 42.8 11/18/2019   MCV  92 11/18/2019   PLT 222 11/18/2019   Lab Results  Component Value Date   NA 137 11/18/2019   K 4.0 11/18/2019   CO2 22 11/18/2019   GLUCOSE 150 (H) 11/18/2019   BUN 17 11/18/2019   CREATININE 0.70 11/18/2019   BILITOT 0.5 11/18/2019   ALKPHOS 73 11/18/2019   AST 26 11/18/2019   ALT 32 11/18/2019   PROT 7.1 11/18/2019   ALBUMIN 4.8 11/18/2019   CALCIUM 9.3 11/18/2019   Lab Results  Component Value Date   CHOL 117 11/18/2019   Lab Results  Component Value Date   HDL 43 11/18/2019   Lab Results  Component Value Date   LDLCALC 51 11/18/2019   Lab Results  Component Value Date   TRIG 130 11/18/2019   Lab Results  Component Value Date   CHOLHDL 2.7 11/18/2019   Lab Results  Component Value Date   HGBA1C 8.1 (A) 02/21/2020      Assessment & Plan:   Problem List Items Addressed This Visit      Cardiovascular and Mediastinum   Hypertension associated with diabetes (HCC) (Chronic)     Endocrine   Hyperlipidemia associated with type 2 diabetes mellitus (HCC) (Chronic)    Other Visit Diagnoses    Type 2 diabetes mellitus with other specified complication, without long-term current use of insulin (HCC)    -  Primary   Relevant Orders   POCT glycosylated hemoglobin (Hb A1C) (Completed)   POCT UA - Microalbumin (Completed)     Type 2 diabetes mellitus with other  specified complication, without long-term current use of insulin, caregiver stress syndrome: -A1c today is 8.1 -Increased personal stress most likely contributing to uncontrolled diabetes mellitus. -Continue current medication regimen. Will continue to monitor and if A1c continues to increase will make medication adjustments. -Continue ambulatory glucose monitoring. -Continue low carbohydrate and glucose diet. -UA microalbumin wnl, <30 mg/g  Hypertension associated with diabetes: -BP at goal. -Continue current medication regimen. -Continue ambulatory BP and pulse monitoring. -Follow DASH  diet.  Hyperlipidemia associated with type 2 diabetes mellitus: -Followed by cardiology. -Most recent lipid panel WNL, LDL 51 -Continue current medication regimen. -Follow heart healthy diet and continue to stay active. -Patient reports she will have fasting blood work done at cardiology.  S/p CABG x 3: -Continue to follow-up with cardiology. -Continue current medication regimen.  No orders of the defined types were placed in this encounter.   Follow-up: Return in about 3 months (around 05/23/2020) for DM, HTN, Hypothyroid.    Mayer Masker, PA-C

## 2020-02-21 NOTE — Patient Instructions (Signed)

## 2020-02-23 ENCOUNTER — Telehealth: Payer: Self-pay | Admitting: Physician Assistant

## 2020-02-23 MED ORDER — RAMIPRIL 5 MG PO CAPS: 5.0000 mg | ORAL_CAPSULE | Freq: Every day | ORAL | 0 refills | Status: DC

## 2020-02-23 MED ORDER — JARDIANCE 25 MG PO TABS: 25.0000 mg | ORAL_TABLET | Freq: Every day | ORAL | 0 refills | Status: DC

## 2020-02-23 NOTE — Telephone Encounter (Signed)
Refill sent to requested pharmacy. AS, CMA 

## 2020-02-23 NOTE — Telephone Encounter (Signed)
Patient is requesting a refill of her thyroid med, Jardiance and ramipril. If approved please send to [facility]

## 2020-02-23 NOTE — Addendum Note (Signed)
Addended by: Mickel Crow on: 02/23/2020 01:37 PM   Modules accepted: Orders

## 2020-03-19 ENCOUNTER — Other Ambulatory Visit: Payer: Self-pay | Admitting: Physician Assistant

## 2020-03-19 ENCOUNTER — Other Ambulatory Visit: Payer: BC Managed Care – PPO

## 2020-03-19 DIAGNOSIS — Z1231 Encounter for screening mammogram for malignant neoplasm of breast: Secondary | ICD-10-CM

## 2020-03-20 LAB — LIPID PANEL
Chol/HDL Ratio: 3.1 ratio (ref 0.0–4.4)
Cholesterol, Total: 144 mg/dL (ref 100–199)
HDL: 47 mg/dL (ref >39)
LDL Chol Calc (NIH): 67 mg/dL (ref 0–99)
Triglycerides: 177 mg/dL — ABNORMAL HIGH (ref 0–149)
VLDL Cholesterol Cal: 30 mg/dL (ref 5–40)

## 2020-03-20 LAB — HEMOGLOBIN A1C
Est. average glucose Bld gHb Est-mCnc: 186 mg/dL
Hgb A1c MFr Bld: 8.1 % — ABNORMAL HIGH (ref 4.8–5.6)

## 2020-03-27 ENCOUNTER — Other Ambulatory Visit: Payer: Self-pay

## 2020-03-27 ENCOUNTER — Ambulatory Visit: Payer: BC Managed Care – PPO | Admitting: Internal Medicine

## 2020-03-27 ENCOUNTER — Ambulatory Visit (INDEPENDENT_AMBULATORY_CARE_PROVIDER_SITE_OTHER): Payer: BC Managed Care – PPO | Admitting: Pharmacist Clinician (PhC)/ Clinical Pharmacy Specialist

## 2020-03-27 ENCOUNTER — Encounter: Payer: Self-pay | Admitting: Internal Medicine

## 2020-03-27 VITALS — BP 136/62 | HR 81 | Ht 63.0 in | Wt 170.0 lb

## 2020-03-27 DIAGNOSIS — I447 Left bundle-branch block, unspecified: Secondary | ICD-10-CM

## 2020-03-27 DIAGNOSIS — Z951 Presence of aortocoronary bypass graft: Secondary | ICD-10-CM

## 2020-03-27 DIAGNOSIS — Z7901 Long term (current) use of anticoagulants: Secondary | ICD-10-CM

## 2020-03-27 DIAGNOSIS — E7849 Other hyperlipidemia: Secondary | ICD-10-CM | POA: Diagnosis not present

## 2020-03-27 DIAGNOSIS — E1100 Type 2 diabetes mellitus with hyperosmolarity without nonketotic hyperglycemic-hyperosmolar coma (NKHHC): Secondary | ICD-10-CM

## 2020-03-27 DIAGNOSIS — Z952 Presence of prosthetic heart valve: Secondary | ICD-10-CM | POA: Diagnosis not present

## 2020-03-27 LAB — POCT INR: INR: 3.5 — AB (ref 2.0–3.0)

## 2020-03-27 NOTE — Patient Instructions (Signed)
Medication Instructions:  None Ordered At This Time.   *If you need a refill on your cardiac medications before your next appointment, please call your pharmacy*  Lab Work: INR check today with PharmD If you have labs (blood work) drawn today and your tests are completely normal, you will receive your results only by:  [facility] (if you have MyChart) OR  A paper copy in the mail If you have any lab test that is abnormal or we need to change your treatment, we will call you to review the results.  Testing/Procedures: Your physician has requested that you have an echocardiogram in 6 months. Echocardiography is a painless test that uses sound waves to create images of your heart. It provides your doctor with information about the size and shape of your heart and how well your hearts chambers and valves are working. This procedure takes approximately one hour. There are no restrictions for this procedure.This will take place at [address]  Follow-Up: At [facility], you and your health needs are our priority.  As part of our continuing mission to provide you with exceptional heart care, we have created designated Provider Care Teams.  These Care Teams include your primary Cardiologist (physician) and Advanced Practice Providers (APPs -  Physician Assistants and Nurse Practitioners) who all work together to provide you with the care you need, when you need it.  Your next appointment:   6 month(s)  The format for your next appointment:   In Person  Provider:   K. Mali Hilty, MD

## 2020-03-27 NOTE — Progress Notes (Signed)
Chief Complaint:  No complaints  Primary Care Physician: Lorrene Reid, PA-C  HPI:  Katrina Obrien is a 64 year old female who has a history of an unfortunate episode with left popliteal artery thrombus, PE, severe aortic stenosis which was ultimately diagnosed in a bicuspid aortic valve, as well as multivessel coronary disease, anterior MI, EF about 40% to 45% - all which came to a head about the same time. She underwent 3-vessel bypass as well as mechanical aortic valve replacement. She has done well on Coumadin and was ultimately diagnosed with diabetes and is followed by Dr. Chalmers Cater for this. Now she is markedly improved, although she has an ischemic cardiomyopathy with an EF of 40% to 45%. Her activity level is pretty good.  We recently performed repeat lipid testing, which demonstrated an elevated LDL particle number of 2163. The calculated LDL was 80. I asked her to increase her Crestor to 40 mg at night and add 500 mg Niaspan. She reports significant flushing and intolerance to the Niaspan, despite taking aspirin 30 minutes prior.  Her laboratory work does look improved, with her particle number being reduced to 1547, LDL 55, HDL of 39.  She does report however worsening pain in her capsule when she walks that works its way up her legs. This is nothing as significant as when she was originally diagnosed with her acute arterial thrombus, but has concerns for claudication. Alternatively, this could represent myalgias from her statin.  Katrina Obrien returns today for followup. She is doing extremely well. She continues to be active and has no complaints of chest pain worsening shortness of breath. She does feel like her legs tire easily however this could be improved with more exercise. She denies any symptoms of claudication. Her INR has been therapeutic. She reports good blood sugar control and has an appointment with her endocrinologist in the next couple of weeks. We recently obtained an  echocardiogram which showed normalization of her EF to 55-60% in March of 2015. She's also had lower extremity arterial Dopplers which show preserved ABIs.  I the pleasure seeing Katrina Obrien back in the office today. She reports doing fairly well except occasionally she gets somewhat "swimmy headed". This is worse with change of position. As previously noted her EF has improved back to 55-60%. Recently she's had persistently elevated lipoprotein particles including an LDL particle number in the 1900s. This is despite being on Zetia and Crestor 40 mg. Based on this I recommended that she start on Pralulent. She's been taking this now for just over a month and is having no problems with injection site reactions or side effects. We will plan to check her cholesterol again in about 2 months.  Katrina Obrien was seen in the office today in follow-up. Overall she seems to be doing very well. 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 She will be due for repeat cholesterol test after the first of the year. She denies any chest pain or worsening shortness of breath. She is complaining of some occasional swelling in her lower extremities which improves in the morning. She also gets some pain in her right leg. She is overdue for repeat lower extremity Dopplers and does have a history of course of arterial thrombus in the leg. Warfarin has been therapeutic and managed by the Coumadin clinic. Cholesterol is still not at goal despite max dose therapy. For brief period of time she was on samples of Repatha (PCSK9 inhibitor) with a marked response in cholesterol, however  insurance would not cover this.  12/10/2015  Katrina Obrien returns today for follow-up. She says this is the best she has felt some time. She denies any chest pain or worsening shortness of breath. Recently her cholesterol numbers have risen with LDL now 102. This would put her not at goal therapy. Given her multiple comorbidities. Recent data indicates the fact  that driving LDL below 50 may improve cardiovascular outcomes. This was with the use of maximal statin therapy as well as the addition of a PCSK9 inhibitor, specifically Repatha. In fact, she had responded very well to Pralulent in the past, with LDL as low as 20 however that was not covered by insurance. 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 Aggressive therapy is warranted.  07/15/2016  Katrina Obrien returns today for follow-up. Overall she feels very well. She had one episode of intense burning in her chest which happened a few weeks ago. It was worse when she was laying down at night. She did not take any medicine for that she was concerned she could take it with her other medicines. It sounds like reflux she's not had any more symptoms like that. I told her she could use Tums or an H2 blocker for that as needed. She recently went on to [place] and her LDL cholesterol is now 19. This should be a significant benefit in slowing her overall cardiovascular risks. As mentioned previously she had an echo this past year which showed normal LVEF 55-60% and a normally functioning mechanical aortic valve. Her INR was therapeutic today.  07/09/2017  Katrina Obrien was seen today in follow-up.  She seems to be doing very well.  She reported that she had to shovel snow for several hours with her son and at the end of that did develop some chest tightness/soreness which went away very quickly.  She said she felt like she overworked herself, but generally has had no symptoms.  Recently we repeated a lipid profile which showed marked improvement in her LDL to 28 on statin, ezetimibe and Repatha.  Her triglycerides were elevated at 349, however she mentioned today that this was nonfasting.  Also her blood sugar was elevated recently.  She was started on Jardiance, which I suspect has made a big difference in her blood sugars.  She says  her hemoglobin A1c now is down to 6.1.  Accordingly, her triglycerides have probably improved as well.  Fortunately, her husband was not with her today.  Apparently he is suffering from depression and has had several bouts.  He is not interested in leaving the house and is not as engaged in activities as he has been in the past.  This is obviously causing her a lot of stress and is not clear to her how she can help him.  03/19/2018  Corrine returns today for follow-up.  She says she is felt today the best she has in years.  Unfortunately, she had a recent colonoscopy which turned out okay however having to come off all of her medications was challenging for her and she was not a fan of bridging Lovenox.  She does report her blood sugars been well controlled.  Her cholesterol is at goal with recent LDL of 58 however it is actually been lower with Repatha.  Her triglycerides are also well controlled at 110.  Her Saint Jude mechanical valve is functioning properly by echo in 2017 which showed normal LVEF.  She  denies any chest pain symptoms with history of three-vessel CABG in 2012.  Blood pressure is at goal today as well.  EKG shows a stable left bundle branch block.  09/16/2018  Lilyan is seen today in follow-up.  She is excited at the fact that she will be having a new grandbaby fairly soon.  Overall she is doing well without new symptoms.  She denies any chest pain or worsening shortness of breath.  Her INR was repeated today was 3.5.  No adjustments were made to her warfarin.  Her mechanical valve is working properly and was last assessed by echo last year.  She has lost some additional weight.  Her blood pressure was elevated little today however she has been under some stress and very recently took her medications.   09/28/2019  Vonnetta returns today for follow-up.  Overall she is doing well.  She said she has had both coronavirus vaccines.  She is back teaching in the classroom.  Unfortunately she has  been struggling with some weight gain.  This was due to stress and other issues with her father-in-law who ultimately died and also having to care for her husband.  Her triglycerides were elevated over the summer but LDL remained at goal.  Her A1c was 6.9 likely indicating that diabetes was a factor.  She is now working with a dietitian, exercising more regularly and eating better.  INR was 2.3 today which is slightly below her target of 2.5-3.5.  03/27/2020  Ms. Kluge returns today for follow-up.  Overall she continues to feel well.  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  She denies any chest pain or worsening shortness of breath.  She denies any symptoms of claudication.  Labs have been fairly stable.  Recent LDL remains below 70.  Triglycerides were a little elevated.  She is on Praluent and 40 mg of rosuvastatin.  Hemoglobin A1c was 8.1.  She is on Jardiance and Metformin.  She may need additional treatments if she is not able to reduce that further with diet.  The last echo showed stable valve gradients in December 2019.  PMHx:  Past Medical History:  Diagnosis Date  . 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 Clotting disorder (HCC)    anticoagulant - coumadin hx DVT, PE  . Coronary artery disease    s/p 3- vessel bypass   . Diabetes mellitus without complication ([place])   . DVT (deep venous thrombosis) ([place])   . Heart murmur   . Hyperlipidemia   . Hypertension   . Ischemic cardiomyopathy    40 to 45%  . Left bundle branch block   . Mechanical heart valve present    Jan 2012  . Popliteal artery occlusion, left (HCC)    recent  status  post angioplasty  . Pulmonary embolism ([place])    associated with DVT  . Pulmonary embolus ([place])   . Thyroid disease    hyperthyroidism    Past Surgical History:  Procedure Laterality Date  . AORTIC VALVE  REPLACEMENT (AVR)/CORONARY ARTERY BYPASS GRAFTING (CABG)  08/16/2010  VAN TRIGT   AV replacement w/19-mm mechanical St Jude valve ([id number]),CABGX3  lima to LAD, SVG to distal circ,SVG to posterior descending  . ASD REPAIR    . CARDIAC CATHETERIZATION  08/08/2010   ,LAD 50% PROX,605 TANDEM SEGMENTAL STENOSIS,LEFT CIRC  60%-AV GROOVE, RGT COR 40% PROX ON THE BEND ,80% PROX PDA,  . CORONARY ARTERY BYPASS GRAFT  08/16/2010   LIMA TO lad,svg to distal circ ,svg to posterior descending  . DOPPLER ECHOCARDIOGRAPHY  08/09/2010   EF35% to 40% ,bbicuspid ;severely  thickened ,severely calcified leaflets, LV normal  . LEA doppler  08/07/2010   left popliteal occlusion ,and anterior tibial complete occlusion  . NM MYOCAR PERF WALL MOTION  08/06/2010   EF 31%  LOW RISK SCAN  . nuc    . PV ANGIOGRAM  08/08/2010   successful PTA LEFT POPLITEAL    FAMHx:  Family History  Problem Relation Age of Onset  . Cancer Mother   . Cancer Father   . CAD Father   . Heart attack Father 22       first MI, 2 major after that, multiple silent MIs  . Stroke Brother   . CAD Brother 46       first stent  . Heart attack Brother   . CAD Brother 86       CABG   . Hypertension Brother   . Heart attack Brother 51       died from MI  . Colon cancer Neg Hx   . Esophageal cancer Neg Hx   . Rectal cancer Neg Hx   . Stomach cancer Neg Hx     SOCHx:   reports that she has never smoked. She has never used smokeless tobacco. She reports current alcohol use of about 7.0 standard drinks of alcohol per week. She reports that she does not use drugs.  ALLERGIES:  Allergies  Allergen Reactions  . Niaspan [Niacin Er] Other (See Comments)    Flushing, even with ASA    ROS: Pertinent items noted in HPI and remainder of comprehensive ROS otherwise negative.  HOME MEDS: Current Outpatient Medications  Medication Sig Dispense Refill  . Alirocumab (PRALUENT) 150 MG/ML SOAJ Inject 1 Dose into the skin every 14 (fourteen)  days. 6 pen 11  . amoxicillin (AMOXIL) 500 MG capsule Take 2 capsules (1,000 mg total) by mouth 2 (two) times daily. 40 capsule 0  . aspirin EC 81 MG tablet Take 81 mg by mouth daily.    . furosemide (LASIX) 40 MG tablet TAKE 1/2 TABLET BY MOUTH DAILY. 45 tablet 2  . JARDIANCE 25 MG TABS tablet Take 1 tablet (25 mg total) by mouth daily. 90 tablet 0  . levothyroxine (SYNTHROID) 50 MCG tablet TAKE 1 TABLET BY MOUTH DAILY BEFORE BREAKFAST. 90 tablet 0  . metFORMIN (GLUCOPHAGE-XR) 500 MG 24 hr tablet Take 1 tablet by mouth in the morning and 2 tablets at night. 270 tablet 0  . metoprolol succinate (TOPROL-XL) 100 MG 24 hr tablet TAKE 1 TABLET BY MOUTH DAILY. TAKE WITH OR IMMEDIATELY FOLLOWING A MEAL. 90 tablet 2  . ramipril (ALTACE) 5 MG capsule Take 1 capsule (5 mg total) by mouth daily. 90 capsule 0  . rosuvastatin (CRESTOR) 40 MG tablet TAKE 1 TABLET BY MOUTH DAILY. 90 tablet 2  . VITAMIN D PO Take by mouth.    . warfarin (COUMADIN) 2 MG tablet TAKE 1 TO 1 AND 1/2 TABLETS BY MOUTH DAILY AS DIRECTED BY COUMADIN CLINIC. 120 tablet 1   No current facility-administered medications for this visit.    LABS/IMAGING: No results found for this or any previous visit (from the past 48 hour(s)). No results found.  VITALS: LMP  (LMP Unknown)   EXAM: General appearance: alert and  no distress Neck: no adenopathy, no carotid bruit, no JVD, supple, symmetrical, trachea midline and thyroid not enlarged, symmetric, no tenderness/mass/nodules Lungs: clear to auscultation bilaterally Heart: regular rate and rhythm, S1, S2 normal, no murmur, click, rub or gallop and Sharp mechanical valve sound Abdomen: soft, non-tender; bowel sounds normal; no masses,  no organomegaly Extremities: extremities normal, atraumatic, no cyanosis or edema Pulses: 2+ and symmetric Skin: Skin color, texture, turgor normal. No rashes or lesions Neurologic: Grossly normal  EKG: Normal sinus rhythm 81, LBBB-personally  reviewed  ASSESSMENT: 1. Coronary artery disease status post 3 vessel CABG (LIMA to LAD, SVG to circumflex, SVG to PDA) - 07/2010 2. Severe aortic stenosis and a bicuspid aortic valve, status post 19 mm St. Jude mechanical aortic valve excellent recent left popliteal artery occlusion with rest pain status post angioplasty (2012) 3. History of pulmonary emboli associated with DVT 4. Ischemic cardiomyopathy, EF now normalized at 55-60% 5. Hypertension - at goal 6. Familial hyperlipidemia - LDL>190 on Repatha 7. Diabetes type 2 - controlled 8. LBBB   PLAN: 1.   Mrs. Keir continues to do well.  We will check an INR today.  She will be due for repeat echo in 6 months.  She denies any chest pain or shortness of breath.  She denies any claudication.  Blood pressure is at goal.  LDL is less than 70 on current treatments.  Diabetes is not quite at target with A1c of 8.1.  She may need additional therapies.  This is managed by her PCP.  No medication changes today.  Follow-up with me in 6 months or sooner as necessary.  Pixie Casino, MD, [facility], [place] Director of the [facility]  [facility] Attending Cardiologist  Direct Dial: [phone]  Fax: [phone]  Website:  [email or web address] 03/27/2020, 8:04 AM

## 2020-04-17 ENCOUNTER — Other Ambulatory Visit: Payer: Self-pay | Admitting: Internal Medicine

## 2020-04-23 ENCOUNTER — Other Ambulatory Visit: Payer: Self-pay

## 2020-04-23 ENCOUNTER — Other Ambulatory Visit: Payer: Self-pay | Admitting: Internal Medicine

## 2020-04-23 ENCOUNTER — Other Ambulatory Visit: Payer: Self-pay | Admitting: Physician Assistant

## 2020-04-23 ENCOUNTER — Ambulatory Visit
Admission: RE | Admit: 2020-04-23 | Discharge: 2020-04-23 | Disposition: A | Discharge status: Home / Self Care | Payer: BC Managed Care – PPO | Source: Ambulatory Visit | Attending: Physician Assistant | Admitting: Physician Assistant

## 2020-04-23 DIAGNOSIS — E1169 Type 2 diabetes mellitus with other specified complication: Secondary | ICD-10-CM

## 2020-04-23 DIAGNOSIS — Z1231 Encounter for screening mammogram for malignant neoplasm of breast: Secondary | ICD-10-CM

## 2020-04-23 NOTE — Telephone Encounter (Signed)
Please review for refill, Thanks !  

## 2020-04-25 ENCOUNTER — Telehealth: Payer: Self-pay | Admitting: Physician Assistant

## 2020-04-25 ENCOUNTER — Ambulatory Visit
Admission: EM | Admit: 2020-04-25 | Discharge: 2020-04-25 | Disposition: A | Discharge status: Home / Self Care | Payer: BC Managed Care – PPO | Attending: Emergency Medicine | Admitting: Emergency Medicine

## 2020-04-25 ENCOUNTER — Other Ambulatory Visit: Payer: Self-pay

## 2020-04-25 DIAGNOSIS — E559 Vitamin D deficiency, unspecified: Secondary | ICD-10-CM | POA: Diagnosis not present

## 2020-04-25 DIAGNOSIS — Z1152 Encounter for screening for COVID-19: Secondary | ICD-10-CM | POA: Diagnosis not present

## 2020-04-25 DIAGNOSIS — J0191 Acute recurrent sinusitis, unspecified: Secondary | ICD-10-CM

## 2020-04-25 MED ORDER — PREDNISONE 20 MG PO TABS: 20.0000 mg | ORAL_TABLET | Freq: Every day | ORAL | 0 refills | Status: DC

## 2020-04-25 NOTE — Telephone Encounter (Signed)
Patient taking OTC Zyrtec and using sinus rinse for sinuses.   Patient states that she has nasal congestion, ear pain, headache, sore throat. Advised she needs to be tested for covid. Patient is going to UC for eval. AS, CMA

## 2020-04-25 NOTE — ED Triage Notes (Signed)
Pt states she has had sinus symptoms x 2 days such as headache and sore throat. Pt is aox4 and ambulatory.

## 2020-04-25 NOTE — Discharge Instructions (Addendum)
Your COVID test is pending - it is important to quarantine / isolate at home until your results are back. °If you test positive and would like further evaluation for persistent or worsening symptoms, you may schedule an E-visit or virtual (video) visit throughout the [facility] MyChart app or website. ° °PLEASE NOTE: If you develop severe chest pain or shortness of breath please go to the ER or call 9-1-1 for further evaluation --> DO NOT schedule electronic or virtual visits for this. °Please call our office for further guidance / recommendations as needed. ° °For information about the Covid vaccine, please visit [email or web address] °

## 2020-04-25 NOTE — ED Provider Notes (Signed)
[facility]    CSN: [id number] Arrival date & time: 04/25/20  1621      History   Chief Complaint Chief Complaint  Patient presents with  . Facial Pain    x 2 days  . Nasal Congestion    x 2 days  . Sore Throat    x 2 days  . Headache    x 2 days    HPI Katrina Obrien is a 64 y.o. female  Presenting for 2-day course of nasal congestion, sinus pressure, headache and sore throat.  Denies fever, cough, difficulty breathing or swallowing, chest pain, vomiting or diarrhea.  No known sick contacts.  States she was evaluated by her PCP via phone who recommended she present here today for Covid testing given symptoms.  Has been taking Zyrtec, sinus rinses with some relief.  Past Medical History:  Diagnosis Date  . 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 Clotting disorder (HCC)    anticoagulant - coumadin hx DVT, PE  . Coronary artery disease    s/p 3- vessel bypass   . Diabetes mellitus without complication ([place])   . DVT (deep venous thrombosis) ([place])   . Heart murmur   . Hyperlipidemia   . Hypertension   . Ischemic cardiomyopathy    40 to 45%  . Left bundle branch block   . Mechanical heart valve present    Jan 2012  . Popliteal artery occlusion, left (HCC)    recent  status  post angioplasty  . Pulmonary embolism ([place])    associated with DVT  . Pulmonary embolus ([place])   . Thyroid disease    hyperthyroidism    Patient Active Problem List   Diagnosis Date Noted  . Uncontrolled type 2 diabetes mellitus with hyperglycemia ([place]) 08/10/2019  . Diarrhea due to drug- metformin 06/08/2019  . Caregiver stress syndrome 03/08/2019  . Vitamin D insufficiency 09/13/2018  . White coat syndrome with diagnosis of hypertension 09/13/2018  . Acute maxillary sinusitis 04/19/2018  . Hyperlipidemia associated with type 2 diabetes mellitus ([place]) 02/03/2018  . Hypothyroidism  02/03/2018  . LBBB (left bundle branch block) 04/06/2014  . 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 Aortic stenosis 01/26/2013  . S/P AVR 01/26/2013  . 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 Controlled type 2 diabetes mellitus without complication ([place]) 54/62/7035  . Hypertension associated with diabetes ([place]) 01/26/2013  . 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 Cardiomyopathy, ischemic 01/26/2013  . DVT (deep venous thrombosis) ([place]) 01/26/2013  . Pulmonary embolus ([place]) 01/26/2013  . Long term (current) use of anticoagulants 10/11/2012    Past Surgical History:  Procedure Laterality Date  . AORTIC VALVE REPLACEMENT (AVR)/CORONARY ARTERY BYPASS GRAFTING (CABG)  08/16/2010  VAN TRIGT   AV replacement w/19-mm mechanical St Jude valve ([id number]),CABGX3  lima to LAD, SVG to distal circ,SVG to posterior descending  . ASD REPAIR    . CARDIAC CATHETERIZATION  08/08/2010   ,LAD 50% PROX,605 TANDEM SEGMENTAL STENOSIS,LEFT CIRC 60%-AV GROOVE, RGT COR 40% PROX ON THE BEND ,80% PROX PDA,  . CORONARY ARTERY BYPASS GRAFT  08/16/2010   LIMA TO lad,svg to distal circ ,svg to posterior descending  . DOPPLER ECHOCARDIOGRAPHY  08/09/2010   EF35% to 40% ,bbicuspid ;severely  thickened ,severely calcified leaflets, LV normal  . LEA doppler  08/07/2010   left popliteal occlusion ,and anterior tibial complete occlusion  . NM MYOCAR PERF WALL  MOTION  08/06/2010   EF 31%  LOW RISK SCAN  . nuc    . PV ANGIOGRAM  08/08/2010   successful PTA LEFT POPLITEAL    OB History   No obstetric history on file.      Home Medications    Prior to Admission medications   Medication Sig Start Date End Date Taking? Authorizing Provider  aspirin EC 81 MG tablet Take 81 mg by mouth daily.    [provider]  furosemide (LASIX) 40 MG tablet TAKE 1/2 TABLET BY MOUTH DAILY. 01/27/20   Hilty, Nadean Corwin, MD  JARDIANCE 25 MG TABS tablet Take 1 tablet (25 mg total) by mouth daily. 02/23/20   Lorrene Reid, PA-C   levothyroxine (SYNTHROID) 50 MCG tablet TAKE 1 TABLET BY MOUTH DAILY BEFORE BREAKFAST. 02/28/20   Abonza, Maritza, PA-C  metFORMIN (GLUCOPHAGE-XR) 500 MG 24 hr tablet TAKE 1 TABLET BY MOUTH IN THE MORNING AND 2 TABLETS AT NIGHT. 04/23/20   Lorrene Reid, PA-C  metoprolol succinate (TOPROL-XL) 100 MG 24 hr tablet TAKE 1 TABLET BY MOUTH DAILY. TAKE WITH OR IMMEDIATELY FOLLOWING A MEAL. 01/27/20   Hilty, Nadean Corwin, MD  PRALUENT 150 MG/ML SOAJ INJECT 1 DOSE INTO THE SKIN EVERY 14 DAYS 04/17/20   Hilty, Nadean Corwin, MD  predniSONE (DELTASONE) 20 MG tablet Take 1 tablet (20 mg total) by mouth daily. 04/25/20   Hall-Potvin, [place], PA-C  ramipril (ALTACE) 5 MG capsule Take 1 capsule (5 mg total) by mouth daily. 02/23/20   Abonza, Herb Grays, PA-C  rosuvastatin (CRESTOR) 40 MG tablet TAKE 1 TABLET BY MOUTH DAILY. 01/27/20   Hilty, Nadean Corwin, MD  VITAMIN D PO Take by mouth.    [provider]  warfarin (COUMADIN) 2 MG tablet TAKE 1 TO 1 AND 1/2 TABLETS BY MOUTH DAILY AS DIRECTED BY COUMADIN CLINIC. 04/23/20   Hilty, Nadean Corwin, MD    Family History Family History  Problem Relation Age of Onset  . Cancer Mother   . Cancer Father   . CAD Father   . Heart attack Father 43       first MI, 2 major after that, multiple silent MIs  . Stroke Brother   . CAD Brother 10       first stent  . Heart attack Brother   . CAD Brother 59       CABG   . Hypertension Brother   . Heart attack Brother 63       died from MI  . Colon cancer Neg Hx   . Esophageal cancer Neg Hx   . Rectal cancer Neg Hx   . Stomach cancer Neg Hx     Social History Social History   Tobacco Use  . Smoking status: Never Smoker  . Smokeless tobacco: Never Used  Vaping Use  . Vaping Use: Never used  Substance Use Topics  . Alcohol use: Yes    Alcohol/week: 7.0 standard drinks    Types: 7 Standard drinks or equivalent per week  . Drug use: No     Allergies   Niaspan [niacin er]   Review of Systems As per HPI   Physical  Exam Triage Vital Signs ED Triage Vitals  Enc Vitals Group     BP 04/25/20 1704 (!) 152/85     Pulse Rate 04/25/20 1704 96     Resp 04/25/20 1704 18     Temp 04/25/20 1704 98.5 F (36.9 C)     Temp Source 04/25/20 1704 Oral  SpO2 04/25/20 1704 98 %     Weight --      Height --      Head Circumference --      Peak Flow --      Pain Score 04/25/20 1706 4     Pain Loc --      Pain Edu? --      Excl. in [place]? --    No data found.  Updated Vital Signs BP (!) 152/85 (BP Location: Left Arm)   Pulse 96   Temp 98.5 F (36.9 C) (Oral)   Resp 18   LMP  (LMP Unknown)   SpO2 98%   Visual Acuity Right Eye Distance:   Left Eye Distance:   Bilateral Distance:    Right Eye Near:   Left Eye Near:    Bilateral Near:     Physical Exam Constitutional:      General: She is not in acute distress.    Appearance: She is not ill-appearing or diaphoretic.  HENT:     Head: Normocephalic and atraumatic.     Right Ear: Tympanic membrane and ear canal normal.     Left Ear: Tympanic membrane and ear canal normal.     Mouth/Throat:     Mouth: Mucous membranes are moist.     Pharynx: Oropharynx is clear. No oropharyngeal exudate or posterior oropharyngeal erythema.  Eyes:     General: No scleral icterus.    Conjunctiva/sclera: Conjunctivae normal.     Pupils: Pupils are equal, round, and reactive to light.  Neck:     Comments: Trachea midline, negative JVD Cardiovascular:     Rate and Rhythm: Normal rate and regular rhythm.     Heart sounds: No murmur heard.  No gallop.   Pulmonary:     Effort: Pulmonary effort is normal. No respiratory distress.     Breath sounds: No wheezing, rhonchi or rales.  Musculoskeletal:     Cervical back: Neck supple. No tenderness.  Lymphadenopathy:     Cervical: No cervical adenopathy.  Skin:    Capillary Refill: Capillary refill takes less than 2 seconds.     Coloration: Skin is not jaundiced or pale.     Findings: No rash.  Neurological:      General: No focal deficit present.     Mental Status: She is alert and oriented to person, place, and time.      UC Treatments / Results  Labs (all labs ordered are listed, but only abnormal results are displayed) Labs Reviewed  NOVEL CORONAVIRUS, NAA    EKG   Radiology No results found.  Procedures Procedures (including critical care time)  Medications Ordered in UC Medications - No data to display  Initial Impression / Assessment and Plan / UC Course  I have reviewed the triage vital signs and the nursing notes.  Pertinent labs & imaging results that were available during my care of the patient were reviewed by me and considered in my medical decision making (see chart for details).     Patient afebrile, nontoxic, with SpO2 98%.  Covid PCR pending.  Patient to quarantine until results are back.  We will treat supportively as outlined below.  Return precautions discussed, patient verbalized understanding and is agreeable to plan. Final Clinical Impressions(s) / UC Diagnoses   Final diagnoses:  Encounter for screening for COVID-19  Acute recurrent sinusitis, unspecified location     Discharge Instructions     Your COVID test is pending - it is important to  quarantine / isolate at home until your results are back. If you test positive and would like further evaluation for persistent or worsening symptoms, you may schedule an E-visit or virtual (video) visit throughout the [facility] app or website.  PLEASE NOTE: If you develop severe chest pain or shortness of breath please go to the ER or call [phone] for further evaluation --> DO NOT schedule electronic or virtual visits for this. Please call our office for further guidance / recommendations as needed.  For information about the Covid vaccine, please visit [email or web address]    ED Prescriptions    Medication Sig Dispense Auth. Provider   predniSONE (DELTASONE) 20 MG tablet Take 1 tablet (20 mg  total) by mouth daily. 5 tablet Hall-Potvin, [place], PA-C     PDMP not reviewed this encounter.   Hall-Potvin, [place], [place] 04/25/20 1838

## 2020-04-25 NOTE — Telephone Encounter (Signed)
Patient called states she suffers w/ Acute maxillary sinusitis & right now has been working to control it for 3 dys with OTC stuff (not working) &  Rinsing, & using Flonase with no relief.  -- Patient is ask for either nurse or provider to call her [phone] with any suggestion or if Rx can be called into pharmacy   --forwarding message to med asst for review w/provider & to call pt w/ response  --glh

## 2020-04-26 LAB — NOVEL CORONAVIRUS, NAA: SARS-CoV-2, NAA: NOT DETECTED

## 2020-04-26 LAB — SARS-COV-2, NAA 2 DAY TAT

## 2020-05-09 ENCOUNTER — Ambulatory Visit (INDEPENDENT_AMBULATORY_CARE_PROVIDER_SITE_OTHER): Payer: BC Managed Care – PPO

## 2020-05-09 ENCOUNTER — Other Ambulatory Visit: Payer: Self-pay

## 2020-05-09 DIAGNOSIS — Z952 Presence of prosthetic heart valve: Secondary | ICD-10-CM

## 2020-05-09 DIAGNOSIS — Z7901 Long term (current) use of anticoagulants: Secondary | ICD-10-CM

## 2020-05-09 LAB — POCT INR: INR: 3.2 — AB (ref 2.0–3.0)

## 2020-05-09 NOTE — Patient Instructions (Signed)
Continue with 1 tablet daily except 1.5 tablets each Monday, Wednesday and Friday.  Repeat INR in 7 weeks

## 2020-05-23 ENCOUNTER — Ambulatory Visit: Payer: BC Managed Care – PPO | Admitting: Physician Assistant

## 2020-05-23 ENCOUNTER — Other Ambulatory Visit: Payer: Self-pay

## 2020-05-23 ENCOUNTER — Encounter: Payer: Self-pay | Admitting: Physician Assistant

## 2020-05-23 ENCOUNTER — Other Ambulatory Visit: Payer: Self-pay | Admitting: Physician Assistant

## 2020-05-23 VITALS — BP 138/74 | HR 75 | Temp 98.0°F | Ht 63.0 in | Wt 170.4 lb

## 2020-05-23 DIAGNOSIS — E039 Hypothyroidism, unspecified: Secondary | ICD-10-CM

## 2020-05-23 DIAGNOSIS — E1165 Type 2 diabetes mellitus with hyperglycemia: Secondary | ICD-10-CM | POA: Diagnosis not present

## 2020-05-23 DIAGNOSIS — I152 Hypertension secondary to endocrine disorders: Secondary | ICD-10-CM

## 2020-05-23 DIAGNOSIS — E1159 Type 2 diabetes mellitus with other circulatory complications: Secondary | ICD-10-CM

## 2020-05-23 DIAGNOSIS — E1169 Type 2 diabetes mellitus with other specified complication: Secondary | ICD-10-CM | POA: Diagnosis not present

## 2020-05-23 DIAGNOSIS — E785 Hyperlipidemia, unspecified: Secondary | ICD-10-CM

## 2020-05-23 NOTE — Progress Notes (Signed)
Established Patient Office Visit  Subjective:  Patient ID: Katrina Obrien, female    DOB: 10-18-1955  Age: 64 y.o. MRN: [id number]  CC:  Chief Complaint  Patient presents with  . Diabetes  . Hypertension  . Hyperlipidemia    HPI [name] presents for follow-up on diabetes mellitus, hypertension, and hyperlipidemia.  Diabetes: Pt denies increased urination or thirst. Pt reports medication compliance. No hypoglycemic events.  Reports has not been checking glucose at home lately.  Also recently completed prednisone taper for sinus infection and states has not been diligent with watching diet.  States has decreased diet Pepsi and is drinking more water.  HTN: Pt denies chest pain, palpitations, dizziness or lower extremity swelling. Taking medication as directed without side effects.  Pt follows a low salt diet.  Continues to stay active.  HLD: Followed by cardiology. Pt taking medication as directed without issues. Denies side effects.   Hypothyroid. Asymptomatic. Has been on same dose of levothyroxine for a long time.   Past Medical History:  Diagnosis Date  . 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 Clotting disorder (HCC)    anticoagulant - coumadin hx DVT, PE  . Coronary artery disease    s/p 3- vessel bypass   . Diabetes mellitus without complication (HCC)   . DVT (deep venous thrombosis) (HCC)   . Heart murmur   . Hyperlipidemia   . Hypertension   . Ischemic cardiomyopathy    40 to 45%  . Left bundle branch block   . Mechanical heart valve present    Jan 2012  . Popliteal artery occlusion, left (HCC)    recent  status  post angioplasty  . Pulmonary embolism (HCC)    associated with DVT  . Pulmonary embolus (HCC)   . Thyroid disease    hyperthyroidism    Past Surgical History:  Procedure Laterality Date  . AORTIC VALVE REPLACEMENT (AVR)/CORONARY ARTERY BYPASS  GRAFTING (CABG)  08/16/2010  VAN TRIGT   AV replacement w/19-mm mechanical St Jude valve ([id number]),CABGX3  lima to LAD, SVG to distal circ,SVG to posterior descending  . ASD REPAIR    . CARDIAC CATHETERIZATION  08/08/2010   ,LAD 50% PROX,605 TANDEM SEGMENTAL STENOSIS,LEFT CIRC 60%-AV GROOVE, RGT COR 40% PROX ON THE BEND ,80% PROX PDA,  . CORONARY ARTERY BYPASS GRAFT  08/16/2010   LIMA TO lad,svg to distal circ ,svg to posterior descending  . DOPPLER ECHOCARDIOGRAPHY  08/09/2010   EF35% to 40% ,bbicuspid ;severely  thickened ,severely calcified leaflets, LV normal  . LEA doppler  08/07/2010   left popliteal occlusion ,and anterior tibial complete occlusion  . NM MYOCAR PERF WALL MOTION  08/06/2010   EF 31%  LOW RISK SCAN  . nuc    . PV ANGIOGRAM  08/08/2010   successful PTA LEFT POPLITEAL    Family History  Problem Relation Age of Onset  . Cancer Mother   . Cancer Father   . CAD Father   . Heart attack Father 1       first MI, 2 major after that, multiple silent MIs  . Stroke Brother   . CAD Brother 48       first stent  . Heart attack Brother   . CAD Brother 88       CABG   . Hypertension Brother   . Heart attack Brother 45  died from MI  . Colon cancer Neg Hx   . Esophageal cancer Neg Hx   . Rectal cancer Neg Hx   . Stomach cancer Neg Hx     Social History   Socioeconomic History  . Marital status: Married    Spouse name: Not on file  . Number of children: Not on file  . Years of education: Not on file  . Highest education level: Not on file  Occupational History  . Not on file  Tobacco Use  . Smoking status: Never Smoker  . Smokeless tobacco: Never Used  Vaping Use  . Vaping Use: Never used  Substance and Sexual Activity  . Alcohol use: Yes    Alcohol/week: 7.0 standard drinks    Types: 7 Standard drinks or equivalent per week  . Drug use: No  . Sexual activity: Not Currently  Other Topics Concern  . Not on file  Social History Narrative  . Not  on file   Social Determinants of Health   Financial Resource Strain:   . Difficulty of Paying Living Expenses: Not on file  Food Insecurity:   . Worried About Programme researcher, broadcasting/film/video in the Last Year: Not on file  . Ran Out of Food in the Last Year: Not on file  Transportation Needs:   . Lack of Transportation (Medical): Not on file  . Lack of Transportation (Non-Medical): Not on file  Physical Activity:   . Days of Exercise per Week: Not on file  . Minutes of Exercise per Session: Not on file  Stress:   . Feeling of Stress : Not on file  Social Connections:   . Frequency of Communication with Friends and Family: Not on file  . Frequency of Social Gatherings with Friends and Family: Not on file  . Attends Religious Services: Not on file  . Active Member of Clubs or Organizations: Not on file  . Attends Banker Meetings: Not on file  . Marital Status: Not on file  Intimate Partner Violence:   . Fear of Current or Ex-Partner: Not on file  . Emotionally Abused: Not on file  . Physically Abused: Not on file  . Sexually Abused: Not on file    Outpatient Medications Prior to Visit  Medication Sig Dispense Refill  . aspirin EC 81 MG tablet Take 81 mg by mouth daily.    . furosemide (LASIX) 40 MG tablet TAKE 1/2 TABLET BY MOUTH DAILY. 45 tablet 2  . JARDIANCE 25 MG TABS tablet Take 1 tablet (25 mg total) by mouth daily. 90 tablet 0  . levothyroxine (SYNTHROID) 50 MCG tablet TAKE 1 TABLET BY MOUTH DAILY BEFORE BREAKFAST. 90 tablet 0  . metFORMIN (GLUCOPHAGE-XR) 500 MG 24 hr tablet TAKE 1 TABLET BY MOUTH IN THE MORNING AND 2 TABLETS AT NIGHT. 270 tablet 0  . metoprolol succinate (TOPROL-XL) 100 MG 24 hr tablet TAKE 1 TABLET BY MOUTH DAILY. TAKE WITH OR IMMEDIATELY FOLLOWING A MEAL. 90 tablet 2  . PRALUENT 150 MG/ML SOAJ INJECT 1 DOSE INTO THE SKIN EVERY 14 DAYS 2 mL 11  . predniSONE (DELTASONE) 20 MG tablet Take 1 tablet (20 mg total) by mouth daily. 5 tablet 0  . ramipril  (ALTACE) 5 MG capsule Take 1 capsule (5 mg total) by mouth daily. 90 capsule 0  . rosuvastatin (CRESTOR) 40 MG tablet TAKE 1 TABLET BY MOUTH DAILY. 90 tablet 2  . VITAMIN D PO Take by mouth.    . warfarin (COUMADIN) 2  MG tablet TAKE 1 TO 1 AND 1/2 TABLETS BY MOUTH DAILY AS DIRECTED BY COUMADIN CLINIC. 120 tablet 1   No facility-administered medications prior to visit.    Allergies  Allergen Reactions  . Niaspan [Niacin Er] Other (See Comments)    Flushing, even with ASA    ROS Review of Systems A fourteen system review of systems was performed and found to be positive as per HPI.   Objective:    Physical Exam General: Well nourished, in no apparent distress. Eyes: PERRLA, EOMs, conjunctiva clr Resp: Respiratory effort- normal, ECTA B/L w/o W/R/R  Cardio: RRR w/o MRGs. Abdomen: no gross distention. Lymphatics:  less 2 sec cap RF M-sk: Full ROM, normal gait.  Skin: Warm, dry  Neuro: Alert, Oriented, no focal deficits Psych: Normal affect, Insight and Judgment appropriate.    BP 138/74   Pulse 75   Temp 98 F (36.7 C) (Oral)   Ht 5\' 3"  (1.6 m)   Wt 170 lb 6.4 oz (77.3 kg)   LMP  (LMP Unknown)   SpO2 98% Comment: on RA  BMI 30.19 kg/m  Wt Readings from Last 3 Encounters:  05/23/20 170 lb 6.4 oz (77.3 kg)  03/27/20 170 lb (77.1 kg)  02/21/20 170 lb 4.8 oz (77.2 kg)     Health Maintenance Due  Topic Date Due  . PNEUMOCOCCAL POLYSACCHARIDE VACCINE AGE 34-64 HIGH RISK  Never done    There are no preventive care reminders to display for this patient.  Lab Results  Component Value Date   TSH 2.010 11/18/2019   Lab Results  Component Value Date   WBC 6.4 11/18/2019   HGB 14.0 11/18/2019   HCT 42.8 11/18/2019   MCV 92 11/18/2019   PLT 222 11/18/2019   Lab Results  Component Value Date   NA 137 11/18/2019   K 4.0 11/18/2019   CO2 22 11/18/2019   GLUCOSE 150 (H) 11/18/2019   BUN 17 11/18/2019   CREATININE 0.70 11/18/2019   BILITOT 0.5 11/18/2019    ALKPHOS 73 11/18/2019   AST 26 11/18/2019   ALT 32 11/18/2019   PROT 7.1 11/18/2019   ALBUMIN 4.8 11/18/2019   CALCIUM 9.3 11/18/2019   Lab Results  Component Value Date   CHOL 144 03/19/2020   Lab Results  Component Value Date   HDL 47 03/19/2020   Lab Results  Component Value Date   LDLCALC 67 03/19/2020   Lab Results  Component Value Date   TRIG 177 (H) 03/19/2020   Lab Results  Component Value Date   CHOLHDL 3.1 03/19/2020   Lab Results  Component Value Date   HGBA1C 8.1 (H) 03/19/2020      Assessment & Plan:   Problem List Items Addressed This Visit      Cardiovascular and Mediastinum   Hypertension associated with diabetes (HCC) - Primary (Chronic)    -BP fairly stable. -Continue current medication regimen. -Follow low-sodium diet. -Stay well-hydrated. -Will continue to monitor.        Endocrine   Hyperlipidemia associated with type 2 diabetes mellitus (HCC) (Chronic)    -Last lipid panel overall stable with the exception of elevated triglycerides at 177. -Continue to follow-up with cardiology as instructed. -Continue current medication regimen. -Recommend to follow a heart healthy diet low in saturated and trans fats. -Will continue to monitor.      Hypothyroidism    -Last TSH WNL -Continue current medication regimen. -Will continue to monitor.      Uncontrolled type 2  diabetes mellitus with hyperglycemia (HCC)    -A1c was checked about 2 months ago by Dr. Rennis Golden and remains elevated at 8.1. Too soon to repeat A1c and most likely elevated due to recent prednisone taper so plan to repeat at next OV.  If A1c continues to be elevated will make medication adjustments. -Recommend to improve diet and monitor carbohydrates and glucose. -Continue to stay active. -Resume ambulatory glucose monitoring. -Will continue to monitor.         No orders of the defined types were placed in this encounter.   Follow-up: Return in about 3 months (around  08/23/2020) for DM, HTN, HLD.   Note:  This note was prepared with assistance of Dragon voice recognition software. Occasional wrong-word or sound-a-like substitutions may have occurred due to the inherent limitations of voice recognition software.   Mayer Masker, PA-C

## 2020-05-23 NOTE — Assessment & Plan Note (Signed)
-  Last TSH WNL -Continue current medication regimen. -Will continue to monitor.

## 2020-05-23 NOTE — Assessment & Plan Note (Signed)
-  BP fairly stable. -Continue current medication regimen. -Follow low-sodium diet. -Stay well-hydrated. -Will continue to monitor.

## 2020-05-23 NOTE — Patient Instructions (Signed)

## 2020-05-23 NOTE — Assessment & Plan Note (Signed)
-  A1c was checked about 2 months ago by Dr. Debara Pickett and remains elevated at 8.1. Too soon to repeat A1c and most likely elevated due to recent prednisone taper so plan to repeat at next OV.  If A1c continues to be elevated will make medication adjustments. -Recommend to improve diet and monitor carbohydrates and glucose. -Continue to stay active. -Resume ambulatory glucose monitoring. -Will continue to monitor.

## 2020-05-23 NOTE — Assessment & Plan Note (Signed)
-  Last lipid panel overall stable with the exception of elevated triglycerides at 177. -Continue to follow-up with cardiology as instructed. -Continue current medication regimen. -Recommend to follow a heart healthy diet low in saturated and trans fats. -Will continue to monitor.

## 2020-06-11 ENCOUNTER — Other Ambulatory Visit: Payer: Self-pay | Admitting: Physician Assistant

## 2020-06-27 ENCOUNTER — Ambulatory Visit (INDEPENDENT_AMBULATORY_CARE_PROVIDER_SITE_OTHER): Payer: BC Managed Care – PPO

## 2020-06-27 ENCOUNTER — Other Ambulatory Visit: Payer: Self-pay

## 2020-06-27 DIAGNOSIS — Z952 Presence of prosthetic heart valve: Secondary | ICD-10-CM | POA: Diagnosis not present

## 2020-06-27 DIAGNOSIS — Z7901 Long term (current) use of anticoagulants: Secondary | ICD-10-CM | POA: Diagnosis not present

## 2020-06-27 LAB — POCT INR: INR: 2.8 (ref 2.0–3.0)

## 2020-06-27 NOTE — Patient Instructions (Signed)
Continue with 1 tablet daily except 1.5 tablets each Monday, Wednesday and Friday.  Repeat INR in 8 weeks

## 2020-07-23 ENCOUNTER — Other Ambulatory Visit: Payer: Self-pay | Admitting: Physician Assistant

## 2020-07-23 DIAGNOSIS — E1169 Type 2 diabetes mellitus with other specified complication: Secondary | ICD-10-CM

## 2020-08-20 ENCOUNTER — Other Ambulatory Visit: Payer: Self-pay | Admitting: Physician Assistant

## 2020-08-23 ENCOUNTER — Ambulatory Visit: Payer: BC Managed Care – PPO | Admitting: Physician Assistant

## 2020-08-27 ENCOUNTER — Other Ambulatory Visit: Payer: Self-pay | Admitting: Physician Assistant

## 2020-08-30 ENCOUNTER — Ambulatory Visit: Payer: BC Managed Care – PPO | Admitting: Internal Medicine

## 2020-09-10 ENCOUNTER — Other Ambulatory Visit: Payer: Self-pay

## 2020-09-10 ENCOUNTER — Ambulatory Visit (INDEPENDENT_AMBULATORY_CARE_PROVIDER_SITE_OTHER): Payer: BC Managed Care – PPO

## 2020-09-10 DIAGNOSIS — Z952 Presence of prosthetic heart valve: Secondary | ICD-10-CM | POA: Diagnosis not present

## 2020-09-10 DIAGNOSIS — Z7901 Long term (current) use of anticoagulants: Secondary | ICD-10-CM

## 2020-09-10 LAB — POCT INR: INR: 3.5 — AB (ref 2.0–3.0)

## 2020-09-10 NOTE — Patient Instructions (Signed)
Continue with 1 tablet daily except 1.5 tablets each Monday, Wednesday and Friday.  Repeat INR in 3 weeks

## 2020-09-17 ENCOUNTER — Other Ambulatory Visit: Payer: Self-pay | Admitting: Physician Assistant

## 2020-09-20 ENCOUNTER — Encounter: Payer: Self-pay | Admitting: Physician Assistant

## 2020-09-20 ENCOUNTER — Ambulatory Visit (INDEPENDENT_AMBULATORY_CARE_PROVIDER_SITE_OTHER): Payer: BC Managed Care – PPO | Admitting: Physician Assistant

## 2020-09-20 ENCOUNTER — Other Ambulatory Visit: Payer: Self-pay

## 2020-09-20 VITALS — BP 129/75 | HR 63 | Temp 99.4°F | Ht 63.0 in | Wt 172.1 lb

## 2020-09-20 DIAGNOSIS — E1169 Type 2 diabetes mellitus with other specified complication: Secondary | ICD-10-CM

## 2020-09-20 DIAGNOSIS — I152 Hypertension secondary to endocrine disorders: Secondary | ICD-10-CM | POA: Diagnosis not present

## 2020-09-20 DIAGNOSIS — E1159 Type 2 diabetes mellitus with other circulatory complications: Secondary | ICD-10-CM | POA: Diagnosis not present

## 2020-09-20 DIAGNOSIS — E785 Hyperlipidemia, unspecified: Secondary | ICD-10-CM

## 2020-09-20 DIAGNOSIS — E1165 Type 2 diabetes mellitus with hyperglycemia: Secondary | ICD-10-CM

## 2020-09-20 LAB — POCT GLYCOSYLATED HEMOGLOBIN (HGB A1C): Hemoglobin A1C: 8.8 % — AB (ref 4.0–5.6)

## 2020-09-20 MED ORDER — OZEMPIC (0.25 OR 0.5 MG/DOSE) 2 MG/1.5ML ~~LOC~~ SOPN: PEN_INJECTOR | SUBCUTANEOUS | 3 refills | Status: DC

## 2020-09-20 NOTE — Progress Notes (Signed)
Established Patient Office Visit  Subjective:  Patient ID: Katrina Obrien, female    DOB: 02-25-1956  Age: 65 y.o. MRN: [id number]  CC:  Chief Complaint  Patient presents with  . Diabetes  . Hypertension  . Hyperlipidemia    HPI Katrina Obrien presents for follow up on diabetes mellitus, hypertension and hyperlipidemia.  Diabetes mellitus: Pt denies increased urination or thirst. Pt reports medication compliance. No hypoglycemic events. Has not been checking glucose at home. Reports significant stress related to various life events including losing her dog. Has not been as diligent with her diet and being active.   HTN: Pt denies chest pain, palpitations, dizziness or lower extremity swelling. Taking medication as directed without side effects. Checks BP at home and BP readings have been low 140s/60s. Pt follows a low salt diet. Tries to stay hydrated.  HLD: Pt taking medication as directed without issues. Reports has an upcoming cardiology appointment including an echocardiogram.      Past Medical History:  Diagnosis Date  . 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 Clotting disorder (HCC)    anticoagulant - coumadin hx DVT, PE  . Coronary artery disease    s/p 3- vessel bypass   . Diabetes mellitus without complication (HCC)   . DVT (deep venous thrombosis) (HCC)   . Heart murmur   . Hyperlipidemia   . Hypertension   . Ischemic cardiomyopathy    40 to 45%  . Left bundle branch block   . Mechanical heart valve present    Jan 2012  . Popliteal artery occlusion, left (HCC)    recent  status  post angioplasty  . Pulmonary embolism (HCC)    associated with DVT  . Pulmonary embolus (HCC)   . Thyroid disease    hyperthyroidism    Past Surgical History:  Procedure Laterality Date  . AORTIC VALVE REPLACEMENT (AVR)/CORONARY ARTERY BYPASS GRAFTING (CABG)  08/16/2010  VAN TRIGT   AV  replacement w/19-mm mechanical St Jude valve ([id number]),CABGX3  lima to LAD, SVG to distal circ,SVG to posterior descending  . ASD REPAIR    . CARDIAC CATHETERIZATION  08/08/2010   ,LAD 50% PROX,605 TANDEM SEGMENTAL STENOSIS,LEFT CIRC 60%-AV GROOVE, RGT COR 40% PROX ON THE BEND ,80% PROX PDA,  . CORONARY ARTERY BYPASS GRAFT  08/16/2010   LIMA TO lad,svg to distal circ ,svg to posterior descending  . DOPPLER ECHOCARDIOGRAPHY  08/09/2010   EF35% to 40% ,bbicuspid ;severely  thickened ,severely calcified leaflets, LV normal  . LEA doppler  08/07/2010   left popliteal occlusion ,and anterior tibial complete occlusion  . NM MYOCAR PERF WALL MOTION  08/06/2010   EF 31%  LOW RISK SCAN  . nuc    . PV ANGIOGRAM  08/08/2010   successful PTA LEFT POPLITEAL    Family History  Problem Relation Age of Onset  . Cancer Mother   . Cancer Father   . CAD Father   . Heart attack Father 48       first MI, 2 major after that, multiple silent MIs  . Stroke Brother   . CAD Brother 48       first stent  . Heart attack Brother   . CAD Brother 47       CABG   . Hypertension Brother   . Heart attack Brother 45       died from MI  . Colon  cancer Neg Hx   . Esophageal cancer Neg Hx   . Rectal cancer Neg Hx   . Stomach cancer Neg Hx     Social History   Socioeconomic History  . Marital status: Married    Spouse name: Not on file  . Number of children: Not on file  . Years of education: Not on file  . Highest education level: Not on file  Occupational History  . Not on file  Tobacco Use  . Smoking status: Never Smoker  . Smokeless tobacco: Never Used  Vaping Use  . Vaping Use: Never used  Substance and Sexual Activity  . Alcohol use: Yes    Alcohol/week: 7.0 standard drinks    Types: 7 Standard drinks or equivalent per week  . Drug use: No  . Sexual activity: Not Currently  Other Topics Concern  . Not on file  Social History Narrative  . Not on file   Social Determinants of Health    Financial Resource Strain: Not on file  Food Insecurity: Not on file  Transportation Needs: Not on file  Physical Activity: Not on file  Stress: Not on file  Social Connections: Not on file  Intimate Partner Violence: Not on file    Outpatient Medications Prior to Visit  Medication Sig Dispense Refill  . aspirin EC 81 MG tablet Take 81 mg by mouth daily.    . furosemide (LASIX) 40 MG tablet TAKE 1/2 TABLET BY MOUTH DAILY. 45 tablet 2  . JARDIANCE 25 MG TABS tablet Take 1 tablet (25 mg total) by mouth daily. 60 tablet 0  . levothyroxine (SYNTHROID) 50 MCG tablet TAKE 1 TABLET BY MOUTH DAILY BEFORE BREAKFAST. 90 tablet 0  . metFORMIN (GLUCOPHAGE-XR) 500 MG 24 hr tablet TAKE 1 TABLET BY MOUTH IN THE MORNING AND 2 TABLETS AT NIGHT. 270 tablet 0  . metoprolol succinate (TOPROL-XL) 100 MG 24 hr tablet TAKE 1 TABLET BY MOUTH DAILY. TAKE WITH OR IMMEDIATELY FOLLOWING A MEAL. 90 tablet 2  . PRALUENT 150 MG/ML SOAJ INJECT 1 DOSE INTO THE SKIN EVERY 14 DAYS 2 mL 11  . ramipril (ALTACE) 5 MG capsule TAKE 1 CAPSULE BY MOUTH DAILY. 90 capsule 0  . rosuvastatin (CRESTOR) 40 MG tablet TAKE 1 TABLET BY MOUTH DAILY. 90 tablet 2  . VITAMIN D PO Take by mouth.    . warfarin (COUMADIN) 2 MG tablet TAKE 1 TO 1 AND 1/2 TABLETS BY MOUTH DAILY AS DIRECTED BY COUMADIN CLINIC. 120 tablet 1  . predniSONE (DELTASONE) 20 MG tablet Take 1 tablet (20 mg total) by mouth daily. (Patient not taking: Reported on 09/20/2020) 5 tablet 0   No facility-administered medications prior to visit.    Allergies  Allergen Reactions  . Niaspan [Niacin Er] Other (See Comments)    Flushing, even with ASA    ROS Review of Systems Review of Systems:  A fourteen system review of systems was performed and found to be positive as per HPI.   Objective:    Physical Exam General:  Pleasant and cooperative, in no acute distress Neuro:  Alert and oriented,  extra-ocular muscles intact  HEENT:  Normocephalic, atraumatic, neck  supple Skin:  no gross rash, warm, pink. Cardiac:  RRR, S1 S2 wnl's Respiratory:  ECTA B/L w/o wheezing, Not using accessory muscles, speaking in full sentences- unlabored. Vascular:  Ext warm, no cyanosis apprec.; cap RF less 2 sec. Psych:  No HI/SI, judgement and insight good, Euthymic mood. Full Affect.   BP 129/75  Pulse 63   Temp 99.4 F (37.4 C)   Ht 5\' 3"  (1.6 m)   Wt 172 lb 1.6 oz (78.1 kg)   LMP  (LMP Unknown)   SpO2 99%   BMI 30.49 kg/m  Wt Readings from Last 3 Encounters:  09/20/20 172 lb 1.6 oz (78.1 kg)  05/23/20 170 lb 6.4 oz (77.3 kg)  03/27/20 170 lb (77.1 kg)     Health Maintenance Due  Topic Date Due  . PNEUMOCOCCAL POLYSACCHARIDE VACCINE AGE 100-64 HIGH RISK  Never done  . OPHTHALMOLOGY EXAM  07/06/2020    There are no preventive care reminders to display for this patient.  Lab Results  Component Value Date   TSH 2.010 11/18/2019   Lab Results  Component Value Date   WBC 6.4 11/18/2019   HGB 14.0 11/18/2019   HCT 42.8 11/18/2019   MCV 92 11/18/2019   PLT 222 11/18/2019   Lab Results  Component Value Date   NA 137 09/20/2020   K 4.4 09/20/2020   CO2 19 (L) 09/20/2020   GLUCOSE 170 (H) 09/20/2020   BUN 31 (H) 09/20/2020   CREATININE 0.58 09/20/2020   BILITOT 0.3 09/20/2020   ALKPHOS 62 09/20/2020   AST 23 09/20/2020   ALT 35 (H) 09/20/2020   PROT 6.8 09/20/2020   ALBUMIN 4.8 09/20/2020   CALCIUM 10.0 09/20/2020   Lab Results  Component Value Date   CHOL 144 03/19/2020   Lab Results  Component Value Date   HDL 47 03/19/2020   Lab Results  Component Value Date   LDLCALC 67 03/19/2020   Lab Results  Component Value Date   TRIG 177 (H) 03/19/2020   Lab Results  Component Value Date   CHOLHDL 3.1 03/19/2020   Lab Results  Component Value Date   HGBA1C 8.8 (A) 09/20/2020      Assessment & Plan:   Problem List Items Addressed This Visit      Cardiovascular and Mediastinum   Hypertension associated with diabetes (HCC)  (Chronic)   Relevant Medications   Semaglutide,0.25 or 0.5MG /DOS, (OZEMPIC, 0.25 OR 0.5 MG/DOSE,) 2 MG/1.5ML SOPN   Other Relevant Orders   Comp Met (CMET) (Completed)     Endocrine   Hyperlipidemia associated with type 2 diabetes mellitus (HCC) (Chronic)   Relevant Medications   Semaglutide,0.25 or 0.5MG /DOS, (OZEMPIC, 0.25 OR 0.5 MG/DOSE,) 2 MG/1.5ML SOPN   Uncontrolled type 2 diabetes mellitus with hyperglycemia (HCC) - Primary   Relevant Medications   Semaglutide,0.25 or 0.5MG /DOS, (OZEMPIC, 0.25 OR 0.5 MG/DOSE,) 2 MG/1.5ML SOPN   Other Relevant Orders   POCT glycosylated hemoglobin (Hb A1C) (Completed)     Uncontrolled type 2 diabetes mellitus with hyperglycemia: -A1c today 8.8, increased from  8.1 and above goal.  Discussed with patient treatment adjustments and is agreeable to starting a GLP-1, no personal or FHx of MEN or MTC. Patient will also benefit from weight loss with GLP-1. Discussed potential side effects.  Advised to me know if unable to tolerate Ozempic. Continue Jardiance and Metformin. -Recommend to resume ambulatory glucose monitoring and reduce carbohydrates and glucose.  -Will continue to monitor.  Hypertension associated with diabetes: -BP initially elevated, BP recheck improved and at goal. -Continue current medication regimen. -Follow low sodium diet and stay well hydrated. -Will continue to monitor alongside cardiology and repeat CMP for medication monitoring.   Hyperlipidemia associated with type 2 diabetes mellitus: -Last lipid panel: total cholesterol 144, triglycerides 177, HDL 47, LDL 67 -On rosuvastatin 40 mg, praluent 150  mg/ml. Last hepatic function normal. -Follow a diet low in saturated fats. Stay as active as possible. -Will repeat hepatic function for medication monitoring. Will continue to monitor alongside cardiology.   Meds ordered this encounter  Medications  . Semaglutide,0.25 or 0.5MG /DOS, (OZEMPIC, 0.25 OR 0.5 MG/DOSE,) 2 MG/1.5ML SOPN     Sig: Inject 0.25 mg [place] once weekly x 4 weeks. Then inject 0.5 mg [place] once weekly.    Dispense:  3 mL    Refill:  3    Order Specific Question:   Supervising Provider    Answer:   Nani Gasser D [[id number]]    Follow-up: Return in about 3 months (around 12/18/2020) for DM-added med, HTN, HLD.    Mayer Masker, PA-C

## 2020-09-20 NOTE — Patient Instructions (Signed)

## 2020-09-21 LAB — COMPREHENSIVE METABOLIC PANEL
ALT: 35 IU/L — ABNORMAL HIGH (ref 0–32)
AST: 23 IU/L (ref 0–40)
Albumin/Globulin Ratio: 2.4 — ABNORMAL HIGH (ref 1.2–2.2)
Albumin: 4.8 g/dL (ref 3.8–4.8)
Alkaline Phosphatase: 62 IU/L (ref 44–121)
BUN/Creatinine Ratio: 53 — ABNORMAL HIGH (ref 12–28)
BUN: 31 mg/dL — ABNORMAL HIGH (ref 8–27)
Bilirubin Total: 0.3 mg/dL (ref 0.0–1.2)
CO2: 19 mmol/L — ABNORMAL LOW (ref 20–29)
Calcium: 10 mg/dL (ref 8.7–10.3)
Chloride: 100 mmol/L (ref 96–106)
Creatinine, Ser: 0.58 mg/dL (ref 0.57–1.00)
GFR calc Af Amer: 113 mL/min/{1.73_m2} (ref >59)
GFR calc non Af Amer: 98 mL/min/{1.73_m2} (ref >59)
Globulin, Total: 2 g/dL (ref 1.5–4.5)
Glucose: 170 mg/dL — ABNORMAL HIGH (ref 65–99)
Potassium: 4.4 mmol/L (ref 3.5–5.2)
Sodium: 137 mmol/L (ref 134–144)
Total Protein: 6.8 g/dL (ref 6.0–8.5)

## 2020-09-25 ENCOUNTER — Ambulatory Visit (HOSPITAL_COMMUNITY): Payer: BC Managed Care – PPO | Attending: Cardiovascular Disease

## 2020-09-25 ENCOUNTER — Other Ambulatory Visit: Payer: Self-pay

## 2020-09-25 DIAGNOSIS — Z952 Presence of prosthetic heart valve: Secondary | ICD-10-CM | POA: Diagnosis present

## 2020-09-25 DIAGNOSIS — E7849 Other hyperlipidemia: Secondary | ICD-10-CM | POA: Insufficient documentation

## 2020-09-25 LAB — ECHOCARDIOGRAM COMPLETE
AR max vel: 0.87 cm2
AV Area VTI: 0.97 cm2
AV Area mean vel: 0.98 cm2
AV Mean grad: 16 mmHg
AV Peak grad: 27 mmHg
Ao pk vel: 2.6 m/s
Area-P 1/2: 3.6 cm2
S' Lateral: 3.3 cm

## 2020-09-26 ENCOUNTER — Telehealth: Payer: Self-pay | Admitting: Internal Medicine

## 2020-09-26 NOTE — Telephone Encounter (Signed)
PA for praluent faxed to [facility] @ [phone]

## 2020-09-27 NOTE — Telephone Encounter (Signed)
PA for Praluent approved until 09/26/2021

## 2020-10-04 ENCOUNTER — Ambulatory Visit: Payer: BC Managed Care – PPO | Admitting: Internal Medicine

## 2020-10-04 ENCOUNTER — Ambulatory Visit (INDEPENDENT_AMBULATORY_CARE_PROVIDER_SITE_OTHER): Payer: BC Managed Care – PPO | Admitting: Pharmacist Clinician (PhC)/ Clinical Pharmacy Specialist

## 2020-10-04 ENCOUNTER — Encounter: Payer: Self-pay | Admitting: Internal Medicine

## 2020-10-04 ENCOUNTER — Other Ambulatory Visit: Payer: Self-pay

## 2020-10-04 VITALS — BP 130/70 | HR 92 | Ht 63.0 in | Wt 167.0 lb

## 2020-10-04 DIAGNOSIS — R5383 Other fatigue: Secondary | ICD-10-CM

## 2020-10-04 DIAGNOSIS — I255 Ischemic cardiomyopathy: Secondary | ICD-10-CM | POA: Diagnosis not present

## 2020-10-04 DIAGNOSIS — Z951 Presence of aortocoronary bypass graft: Secondary | ICD-10-CM | POA: Diagnosis not present

## 2020-10-04 DIAGNOSIS — Z7901 Long term (current) use of anticoagulants: Secondary | ICD-10-CM | POA: Diagnosis not present

## 2020-10-04 DIAGNOSIS — R0602 Shortness of breath: Secondary | ICD-10-CM

## 2020-10-04 DIAGNOSIS — E7849 Other hyperlipidemia: Secondary | ICD-10-CM | POA: Diagnosis not present

## 2020-10-04 DIAGNOSIS — Z952 Presence of prosthetic heart valve: Secondary | ICD-10-CM | POA: Diagnosis not present

## 2020-10-04 LAB — POCT INR: INR: 2.9 (ref 2.0–3.0)

## 2020-10-04 NOTE — Patient Instructions (Signed)
Medication Instructions:  Your physician recommends that you continue on your current medications as directed. Please refer to the Current Medication list given to you today. *If you need a refill on your cardiac medications before your next appointment, please call your pharmacy*   Lab Work: FASTING lab work to check cholesterol   If you have labs (blood work) drawn today and your tests are completely normal, you will receive your results only by: Marland Kitchen MyChart Message (if you have MyChart) OR . A paper copy in the mail If you have any lab test that is abnormal or we need to change your treatment, we will call you to review the results.   Testing/Procedures: Dr. Debara Pickett has ordered a Lexiscan Myocardial Perfusion Imaging Study. This will be scheduled @ [address]  Please arrive 15 minutes prior to your appointment time for registration and insurance purposes.   The test will take approximately 3 to 4 hours to complete; you may bring reading material.  If someone comes with you to your appointment, they will need to remain in the main lobby due to limited space in the testing area. **If you are pregnant or breastfeeding, please notify the nuclear lab prior to your appointment**   How to prepare for your Myocardial Perfusion Test:  Do not eat or drink 3 hours prior to your test, except you may have water.  Do not consume products containing caffeine (regular or decaffeinated) 12 hours prior to your test. (ex: coffee, chocolate, sodas, tea).  Do wear comfortable clothes (no dresses or overalls) and walking shoes, tennis shoes preferred (No heels or open toe shoes are allowed).  Do NOT wear cologne, perfume, aftershave, or lotions (deodorant is allowed).  If you use an inhaler, use it the AM of your test and bring it with you.   If you use a nebulizer, use it the AM of your test.   If these instructions are not followed, your test will have to be  rescheduled.    Follow-Up: At [facility], you and your health needs are our priority.  As part of our continuing mission to provide you with exceptional heart care, we have created designated Provider Care Teams.  These Care Teams include your primary Cardiologist (physician) and Advanced Practice Providers (APPs -  Physician Assistants and Nurse Practitioners) who all work together to provide you with the care you need, when you need it.  We recommend signing up for the patient portal called "MyChart".  Sign up information is provided on this After Visit Summary.  MyChart is used to connect with patients for Virtual Visits (Telemedicine).  Patients are able to view lab/test results, encounter notes, upcoming appointments, etc.  Non-urgent messages can be sent to your provider as well.   To learn more about what you can do with MyChart, go to [email or web address].    Your next appointment:   4 week(s)  The format for your next appointment:   In Person  Provider:   You may see Pixie Casino, MD or one of the following Advanced Practice Providers on your designated Care Team:    Almyra Deforest, PA-C  Fabian Sharp, PA-C or   Roby Lofts, [place]    Other Instructions

## 2020-10-04 NOTE — Progress Notes (Signed)
Chief Complaint:  Some fatigue, dyspnea  Primary Care Physician: Lorrene Reid, PA-C  HPI:  Katrina Obrien is a 65 year old female who has a history of an unfortunate episode with left popliteal artery thrombus, PE, severe aortic stenosis which was ultimately diagnosed in a bicuspid aortic valve, as well as multivessel coronary disease, anterior MI, EF about 40% to 45% - all which came to a head about the same time. She underwent 3-vessel bypass as well as mechanical aortic valve replacement. She has done well on Coumadin and was ultimately diagnosed with diabetes and is followed by Dr. Chalmers Cater for this. Now she is markedly improved, although she has an ischemic cardiomyopathy with an EF of 40% to 45%. Her activity level is pretty good.  We recently performed repeat lipid testing, which demonstrated an elevated LDL particle number of 2163. The calculated LDL was 80. I asked her to increase her Crestor to 40 mg at night and add 500 mg Niaspan. She reports significant flushing and intolerance to the Niaspan, despite taking aspirin 30 minutes prior.  Her laboratory work does look improved, with her particle number being reduced to 1547, LDL 55, HDL of 39.  She does report however worsening pain in her capsule when she walks that works its way up her legs. This is nothing as significant as when she was originally diagnosed with her acute arterial thrombus, but has concerns for claudication. Alternatively, this could represent myalgias from her statin.  Katrina Obrien returns today for followup. She is doing extremely well. She continues to be active and has no complaints of chest pain worsening shortness of breath. She does feel like her legs tire easily however this could be improved with more exercise. She denies any symptoms of claudication. Her INR has been therapeutic. She reports good blood sugar control and has an appointment with her endocrinologist in the next couple of weeks. We recently obtained  an echocardiogram which showed normalization of her EF to 55-60% in March of 2015. She's also had lower extremity arterial Dopplers which show preserved ABIs.  I the pleasure seeing Katrina Obrien back in the office today. She reports doing fairly well except occasionally she gets somewhat "swimmy headed". This is worse with change of position. As previously noted her EF has improved back to 55-60%. Recently she's had persistently elevated lipoprotein particles including an LDL particle number in the 1900s. This is despite being on Zetia and Crestor 40 mg. Based on this I recommended that she start on Pralulent. She's been taking this now for just over a month and is having no problems with injection site reactions or side effects. We will plan to check her cholesterol again in about 2 months.  Katrina Obrien was seen in the office today in follow-up. Overall she seems to be doing very well. 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 She will be due for repeat cholesterol test after the first of the year. She denies any chest pain or worsening shortness of breath. She is complaining of some occasional swelling in her lower extremities which improves in the morning. She also gets some pain in her right leg. She is overdue for repeat lower extremity Dopplers and does have a history of course of arterial thrombus in the leg. Warfarin has been therapeutic and managed by the Coumadin clinic. Cholesterol is still not at goal despite max dose therapy. For brief period of time she was on samples of Repatha (PCSK9 inhibitor) with a marked response in cholesterol,  however insurance would not cover this.  12/10/2015  Katrina Obrien returns today for follow-up. She says this is the best she has felt some time. She denies any chest pain or worsening shortness of breath. Recently her cholesterol numbers have risen with LDL now 102. This would put her not at goal therapy. Given her multiple comorbidities. Recent data indicates the  fact that driving LDL below 50 may improve cardiovascular outcomes. This was with the use of maximal statin therapy as well as the addition of a PCSK9 inhibitor, specifically Repatha. In fact, she had responded very well to Pralulent in the past, with LDL as low as 20 however that was not covered by insurance. 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 Aggressive therapy is warranted.  07/15/2016  Katrina Obrien returns today for follow-up. Overall she feels very well. She had one episode of intense burning in her chest which happened a few weeks ago. It was worse when she was laying down at night. She did not take any medicine for that she was concerned she could take it with her other medicines. It sounds like reflux she's not had any more symptoms like that. I told her she could use Tums or an H2 blocker for that as needed. She recently went on to [place] and her LDL cholesterol is now 19. This should be a significant benefit in slowing her overall cardiovascular risks. As mentioned previously she had an echo this past year which showed normal LVEF 55-60% and a normally functioning mechanical aortic valve. Her INR was therapeutic today.  07/09/2017  Katrina Obrien was seen today in follow-up.  She seems to be doing very well.  She reported that she had to shovel snow for several hours with her son and at the end of that did develop some chest tightness/soreness which went away very quickly.  She said she felt like she overworked herself, but generally has had no symptoms.  Recently we repeated a lipid profile which showed marked improvement in her LDL to 28 on statin, ezetimibe and Repatha.  Her triglycerides were elevated at 349, however she mentioned today that this was nonfasting.  Also her blood sugar was elevated recently.  She was started on Jardiance, which I suspect has made a big difference in her blood sugars.  She  says her hemoglobin A1c now is down to 6.1.  Accordingly, her triglycerides have probably improved as well.  Fortunately, her husband was not with her today.  Apparently he is suffering from depression and has had several bouts.  He is not interested in leaving the house and is not as engaged in activities as he has been in the past.  This is obviously causing her a lot of stress and is not clear to her how she can help him.  03/19/2018  Katrina Obrien returns today for follow-up.  She says she is felt today the best she has in years.  Unfortunately, she had a recent colonoscopy which turned out okay however having to come off all of her medications was challenging for her and she was not a fan of bridging Lovenox.  She does report her blood sugars been well controlled.  Her cholesterol is at goal with recent LDL of 58 however it is actually been lower with Repatha.  Her triglycerides are also well controlled at 110.  Her Saint Jude mechanical valve is functioning properly by echo in 2017 which showed normal LVEF.  She denies any chest pain symptoms with history of three-vessel CABG in 2012.  Blood pressure is at goal today as well.  EKG shows a stable left bundle branch block.  09/16/2018  Katrina Obrien is seen today in follow-up.  She is excited at the fact that she will be having a new grandbaby fairly soon.  Overall she is doing well without new symptoms.  She denies any chest pain or worsening shortness of breath.  Her INR was repeated today was 3.5.  No adjustments were made to her warfarin.  Her mechanical valve is working properly and was last assessed by echo last year.  She has lost some additional weight.  Her blood pressure was elevated little today however she has been under some stress and very recently took her medications.   09/28/2019  Katrina Obrien returns today for follow-up.  Overall she is doing well.  She said she has had both coronavirus vaccines.  She is back teaching in the classroom.  Unfortunately  she has been struggling with some weight gain.  This was due to stress and other issues with her father-in-law who ultimately died and also having to care for her husband.  Her triglycerides were elevated over the summer but LDL remained at goal.  Her A1c was 6.9 likely indicating that diabetes was a factor.  She is now working with a dietitian, exercising more regularly and eating Obrien.  INR was 2.3 today which is slightly below her target of 2.5-3.5.  03/27/2020  Katrina Obrien returns today for follow-up.  Overall she continues to feel well.  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  She denies any chest pain or worsening shortness of breath.  She denies any symptoms of claudication.  Labs have been fairly stable.  Recent LDL remains below 70.  Triglycerides were a little elevated.  She is on Praluent and 40 mg of rosuvastatin.  Hemoglobin A1c was 8.1.  She is on Jardiance and Metformin.  She may need additional treatments if she is not able to reduce that further with diet.  The last echo showed stable valve gradients in December 2019.  10/04/2020  Katrina Obrien is seen today in follow-up.  She underwent a recent echocardiogram which surprisingly showed some decline in LVEF to 45 to 50%.  Is been a small increase in her aortic valve gradient to 16 mmHg.  The aorta is mildly dilated at 39 mm.  She reports that she has had a little bit more fatigue.  She gets short of breath but only with marked exertion.  She does have a left bundle branch block which has been chronic, however I wonder whether this might be contributing to her cardiomyopathy.  She also had COVID-19 despite being vaccinated and boosted just a couple months ago.  She feels like she is recovered from that however I wonder if that could have played a role in this cardiomyopathy as well.  She does have coronary artery disease with prior grafts and she could have some degree of ischemia.  She says  she does get some dyspnea and fatigue but no real anginal symptoms  PMHx:  Past Medical History:  Diagnosis Date  . 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 Clotting disorder (HCC)    anticoagulant - coumadin hx DVT, PE  . Coronary artery disease  s/p 3- vessel bypass   . Diabetes mellitus without complication ([place])   . DVT (deep venous thrombosis) ([place])   . Heart murmur   . Hyperlipidemia   . Hypertension   . Ischemic cardiomyopathy    40 to 45%  . Left bundle branch block   . Mechanical heart valve present    Jan 2012  . Popliteal artery occlusion, left (HCC)    recent  status  post angioplasty  . Pulmonary embolism ([place])    associated with DVT  . Pulmonary embolus ([place])   . Thyroid disease    hyperthyroidism    Past Surgical History:  Procedure Laterality Date  . AORTIC VALVE REPLACEMENT (AVR)/CORONARY ARTERY BYPASS GRAFTING (CABG)  08/16/2010  VAN TRIGT   AV replacement w/19-mm mechanical St Jude valve ([id number]),CABGX3  lima to LAD, SVG to distal circ,SVG to posterior descending  . ASD REPAIR    . CARDIAC CATHETERIZATION  08/08/2010   ,LAD 50% PROX,605 TANDEM SEGMENTAL STENOSIS,LEFT CIRC 60%-AV GROOVE, RGT COR 40% PROX ON THE BEND ,80% PROX PDA,  . CORONARY ARTERY BYPASS GRAFT  08/16/2010   LIMA TO lad,svg to distal circ ,svg to posterior descending  . DOPPLER ECHOCARDIOGRAPHY  08/09/2010   EF35% to 40% ,bbicuspid ;severely  thickened ,severely calcified leaflets, LV normal  . LEA doppler  08/07/2010   left popliteal occlusion ,and anterior tibial complete occlusion  . NM MYOCAR PERF WALL MOTION  08/06/2010   EF 31%  LOW RISK SCAN  . nuc    . PV ANGIOGRAM  08/08/2010   successful PTA LEFT POPLITEAL    FAMHx:  Family History  Problem Relation Age of Onset  . Cancer Mother   . Cancer Father   . CAD Father   . Heart attack Father 6       first MI, 2 major after that,  multiple silent MIs  . Stroke Brother   . CAD Brother 2       first stent  . Heart attack Brother   . CAD Brother 50       CABG   . Hypertension Brother   . Heart attack Brother 62       died from MI  . Colon cancer Neg Hx   . Esophageal cancer Neg Hx   . Rectal cancer Neg Hx   . Stomach cancer Neg Hx     SOCHx:   reports that she has never smoked. She has never used smokeless tobacco. She reports current alcohol use of about 7.0 standard drinks of alcohol per week. She reports that she does not use drugs.  ALLERGIES:  Allergies  Allergen Reactions  . Niaspan [Niacin Er] Other (See Comments)    Flushing, even with ASA    ROS: Pertinent items noted in HPI and remainder of comprehensive ROS otherwise negative.  HOME MEDS: Current Outpatient Medications  Medication Sig Dispense Refill  . aspirin EC 81 MG tablet Take 81 mg by mouth daily.    . furosemide (LASIX) 40 MG tablet TAKE 1/2 TABLET BY MOUTH DAILY. 45 tablet 2  . JARDIANCE 25 MG TABS tablet Take 1 tablet (25 mg total) by mouth daily. 60 tablet 0  . levothyroxine (SYNTHROID) 50 MCG tablet TAKE 1 TABLET BY MOUTH DAILY BEFORE BREAKFAST. 90 tablet 0  . metFORMIN (GLUCOPHAGE-XR) 500 MG 24 hr tablet TAKE 1 TABLET BY MOUTH IN THE MORNING AND 2 TABLETS AT NIGHT. 270 tablet 0  . metoprolol succinate (TOPROL-XL) 100 MG 24 hr tablet TAKE  1 TABLET BY MOUTH DAILY. TAKE WITH OR IMMEDIATELY FOLLOWING A MEAL. 90 tablet 2  . PRALUENT 150 MG/ML SOAJ INJECT 1 DOSE INTO THE SKIN EVERY 14 DAYS 2 mL 11  . ramipril (ALTACE) 5 MG capsule TAKE 1 CAPSULE BY MOUTH DAILY. 90 capsule 0  . rosuvastatin (CRESTOR) 40 MG tablet TAKE 1 TABLET BY MOUTH DAILY. 90 tablet 2  . Semaglutide,0.25 or 0.5MG /DOS, (OZEMPIC, 0.25 OR 0.5 MG/DOSE,) 2 MG/1.5ML SOPN Inject 0.25 mg [place] once weekly x 4 weeks. Then inject 0.5 mg [place] once weekly. 3 mL 3  . VITAMIN D PO Take by mouth.    . warfarin (COUMADIN) 2 MG tablet TAKE 1 TO 1 AND 1/2 TABLETS BY MOUTH DAILY AS DIRECTED  BY COUMADIN CLINIC. 120 tablet 1   No current facility-administered medications for this visit.    LABS/IMAGING: Results for orders placed or performed in visit on 10/04/20 (from the past 48 hour(s))  POCT INR     Status: None   Collection Time: 10/04/20 12:59 PM  Result Value Ref Range   INR 2.9 2.0 - 3.0   No results found.  VITALS: BP 130/70   Pulse 92   Ht 5\' 3"  (1.6 m)   Wt 167 lb (75.8 kg)   LMP  (LMP Unknown)   SpO2 99%   BMI 29.58 kg/m   EXAM: General appearance: alert and no distress Neck: no adenopathy, no carotid bruit, no JVD, supple, symmetrical, trachea midline and thyroid not enlarged, symmetric, no tenderness/mass/nodules Lungs: clear to auscultation bilaterally Heart: regular rate and rhythm, S1, S2 normal, no murmur, click, rub or gallop and Sharp mechanical valve sound Abdomen: soft, non-tender; bowel sounds normal; no masses,  no organomegaly Extremities: extremities normal, atraumatic, no cyanosis or edema Pulses: 2+ and symmetric Skin: Skin color, texture, turgor normal. No rashes or lesions Neurologic: Grossly normal  EKG: Normal sinus rhythm at 92, LBBB-personally reviewed  ASSESSMENT: 1. Recent fatigue/dyspnea-LVEF 45 to 50% (09/2020) 2. Coronary artery disease status post 3 vessel CABG (LIMA to LAD, SVG to circumflex, SVG to PDA) - 07/2010 3. Severe aortic stenosis and a bicuspid aortic valve, status post 19 mm St. Jude mechanical aortic valve excellent recent left popliteal artery occlusion with rest pain status post angioplasty (2012) 4. History of pulmonary emboli associated with DVT 5. Ischemic cardiomyopathy, EF now normalized at 55-60% 6. Hypertension - at goal 7. Familial hyperlipidemia - LDL>190 on Repatha 8. Diabetes type 2 - controlled 9. LBBB   PLAN: 1.   Mrs. Obrien has had some recent decline in LV function on her echo.  The etiology is not clear but could be related to LBBB or COVID-19 that she had earlier.  She does have known  coronary disease with prior bypass grafts.  I would like to get a Myoview stress test to evaluate for any ischemia.  If this is nonischemic then I would recommend discontinuing her ramipril and starting her on Entresto.  Plan follow-up with me afterwards.  We will repeat lipids as well today  Pixie Casino, MD, FACC, [place] Director of the [facility] Attending Cardiologist  Direct Dial: [phone]  Fax: [phone]  Website:  [email or web address] Arlena Marsan 10/04/2020, 1:30 PM

## 2020-10-08 ENCOUNTER — Telehealth (HOSPITAL_COMMUNITY): Payer: Self-pay | Admitting: *Deleted

## 2020-10-08 NOTE — Telephone Encounter (Signed)
Patient given detailed instructions per Myocardial Perfusion Study Information Sheet for the test on 10/10/20 at 10:15. Patient notified to arrive 15 minutes early and that it is imperative to arrive on time for appointment to keep from having the test rescheduled.  If you need to cancel or reschedule your appointment, please call the office within 24 hours of your appointment. . Patient verbalized understanding.Katrina Obrien

## 2020-10-10 ENCOUNTER — Other Ambulatory Visit: Payer: Self-pay

## 2020-10-10 ENCOUNTER — Ambulatory Visit (HOSPITAL_COMMUNITY): Payer: BC Managed Care – PPO

## 2020-10-10 ENCOUNTER — Ambulatory Visit (HOSPITAL_COMMUNITY): Payer: BC Managed Care – PPO | Attending: Cardiology

## 2020-10-10 DIAGNOSIS — Z951 Presence of aortocoronary bypass graft: Secondary | ICD-10-CM | POA: Diagnosis not present

## 2020-10-10 DIAGNOSIS — R5383 Other fatigue: Secondary | ICD-10-CM | POA: Diagnosis present

## 2020-10-10 DIAGNOSIS — R0602 Shortness of breath: Secondary | ICD-10-CM | POA: Diagnosis not present

## 2020-10-10 LAB — MYOCARDIAL PERFUSION IMAGING
LV dias vol: 116 mL (ref 46–106)
LV sys vol: 83 mL
Peak HR: 97 {beats}/min
Rest HR: 72 {beats}/min
SDS: 4
SRS: 2
SSS: 7
TID: 1.13

## 2020-10-10 MED ORDER — TECHNETIUM TC 99M TETROFOSMIN IV KIT
10.8000 | PACK | Freq: Once | INTRAVENOUS | Status: AC | PRN
  Administered 2020-10-10: 10.8 via INTRAVENOUS
  Filled 2020-10-10: qty 11

## 2020-10-10 MED ORDER — REGADENOSON 0.4 MG/5ML IV SOLN
0.4000 mg | Freq: Once | INTRAVENOUS | Status: AC
  Administered 2020-10-10: 0.4 mg via INTRAVENOUS

## 2020-10-10 MED ORDER — TECHNETIUM TC 99M TETROFOSMIN IV KIT
31.3000 | PACK | Freq: Once | INTRAVENOUS | Status: AC | PRN
  Administered 2020-10-10: 31.3 via INTRAVENOUS
  Filled 2020-10-10: qty 32

## 2020-10-11 ENCOUNTER — Telehealth: Payer: Self-pay | Admitting: Physician Assistant

## 2020-10-11 NOTE — Telephone Encounter (Signed)
Patient scheduled.

## 2020-10-11 NOTE — Telephone Encounter (Signed)
Patient has her blood work order from her cardiologist and would like to see if she can have it done here, please advise. Thank you.

## 2020-10-12 ENCOUNTER — Other Ambulatory Visit: Payer: Self-pay

## 2020-10-12 ENCOUNTER — Other Ambulatory Visit: Payer: BC Managed Care – PPO

## 2020-10-13 LAB — LIPID PANEL
Chol/HDL Ratio: 2.2 ratio (ref 0.0–4.4)
Cholesterol, Total: 83 mg/dL — ABNORMAL LOW (ref 100–199)
HDL: 38 mg/dL — ABNORMAL LOW (ref >39)
LDL Chol Calc (NIH): 21 mg/dL (ref 0–99)
Triglycerides: 143 mg/dL (ref 0–149)
VLDL Cholesterol Cal: 24 mg/dL (ref 5–40)

## 2020-10-14 IMAGING — MG DIGITAL SCREENING BILAT W/ TOMO W/ CAD
8 series · 8 of 24 positions shown · non-contrast
Comparison: Previous exam(s).

CLINICAL DATA: Screening.

EXAM:
DIGITAL SCREENING BILATERAL MAMMOGRAM WITH TOMO AND CAD

[R CC synth-2D]
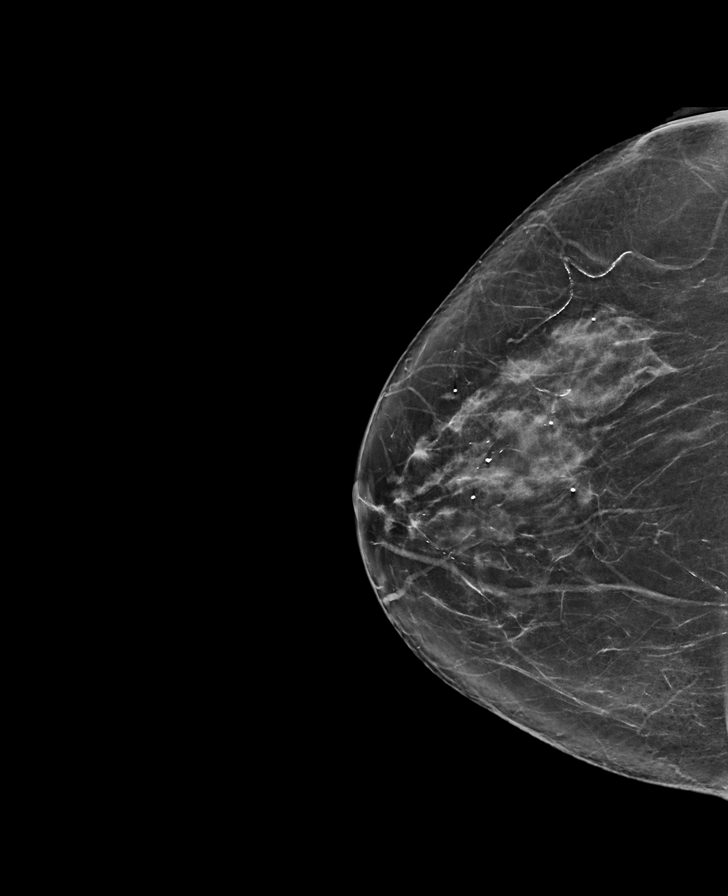

[L CC synth-2D]
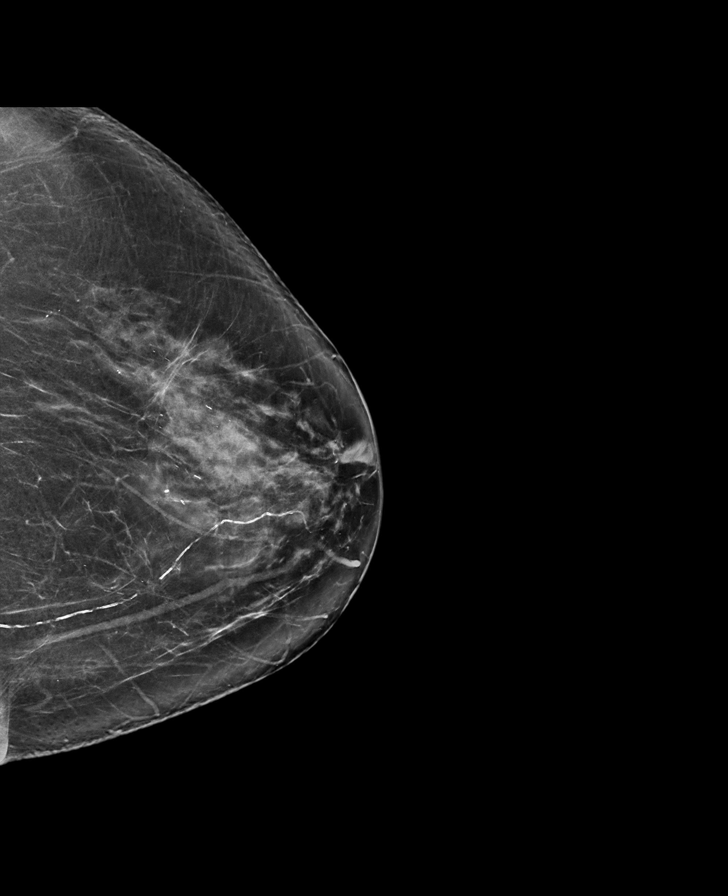

[R MLO synth-2D]
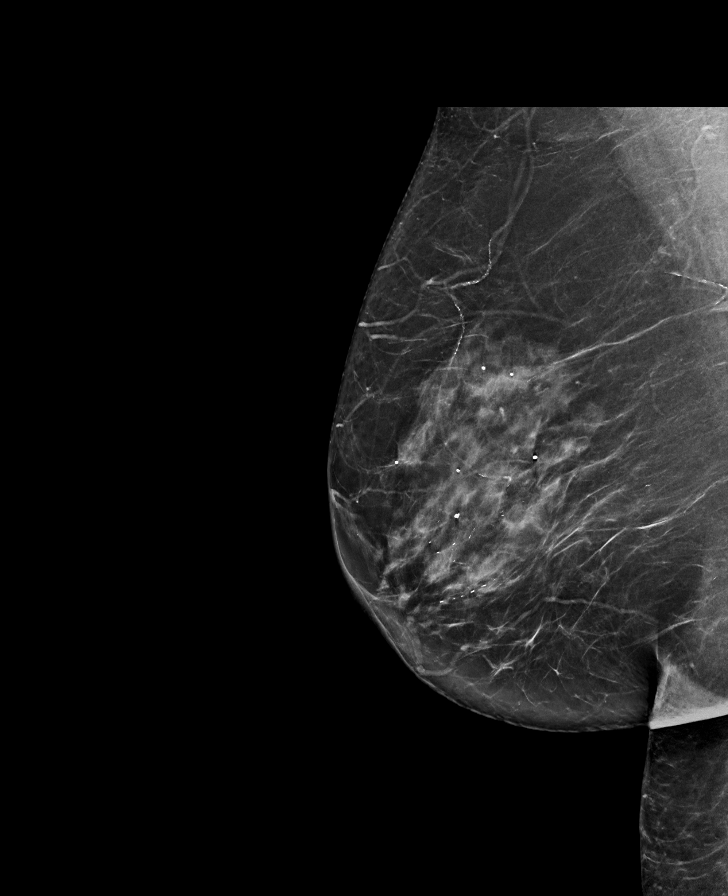

[L MLO synth-2D]
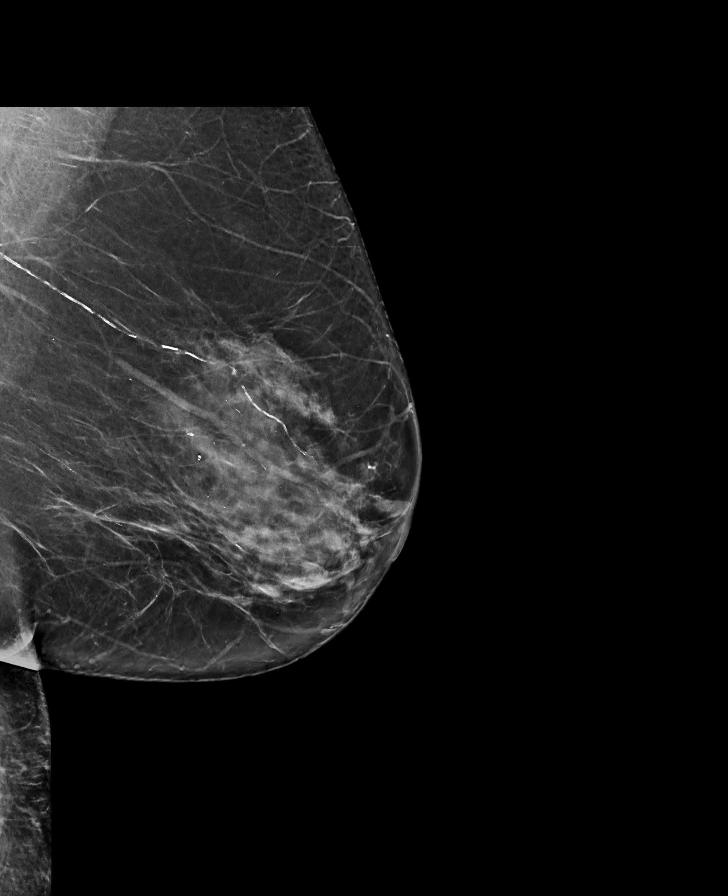

[R CC tomo · tomo slice 39/76.0]
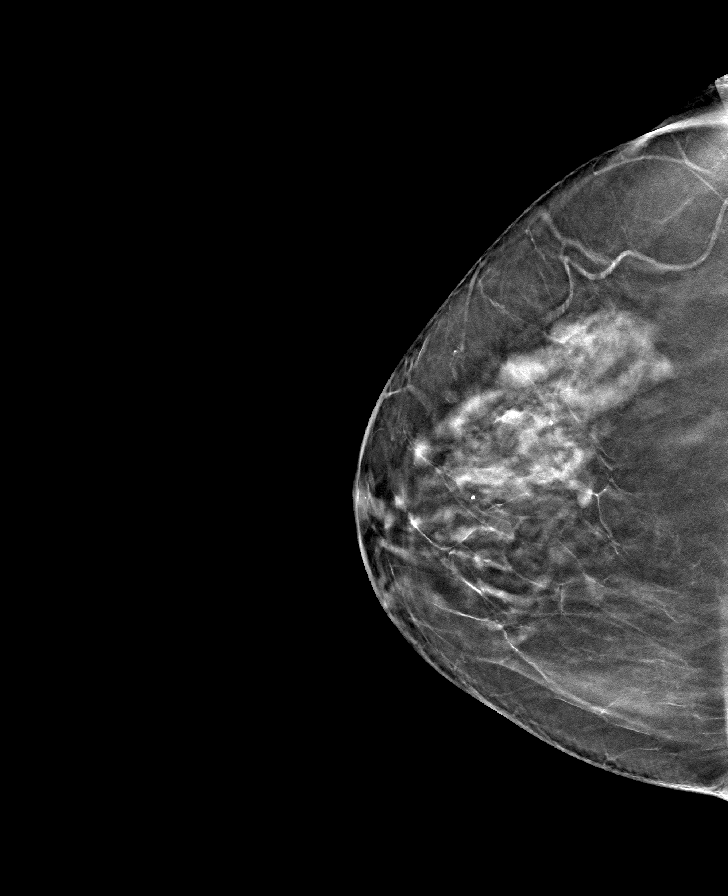

[L CC tomo · tomo slice 39/78.0]
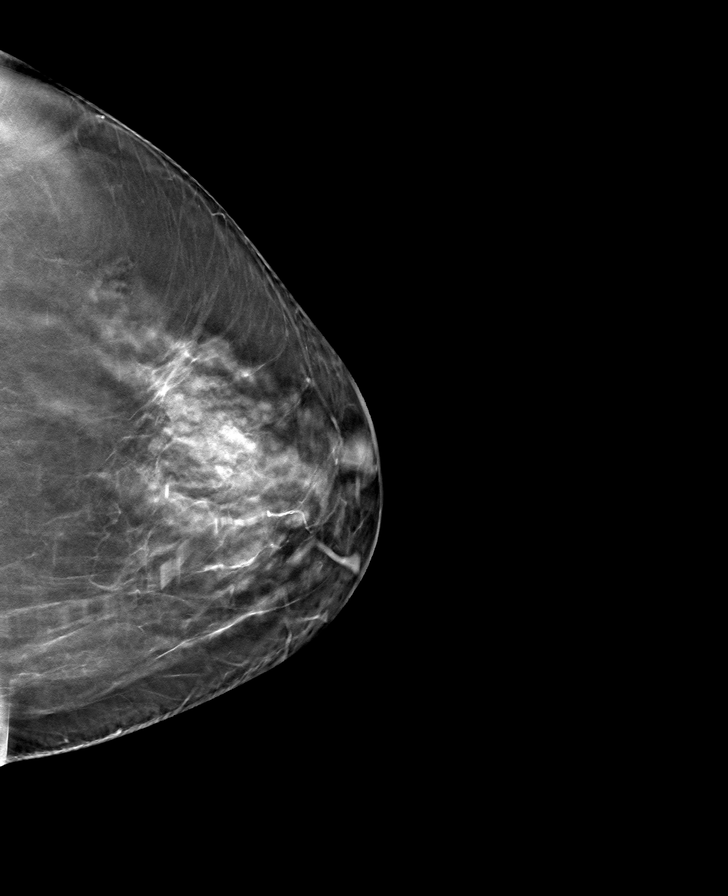

[L MLO tomo · tomo slice 39/76.0]
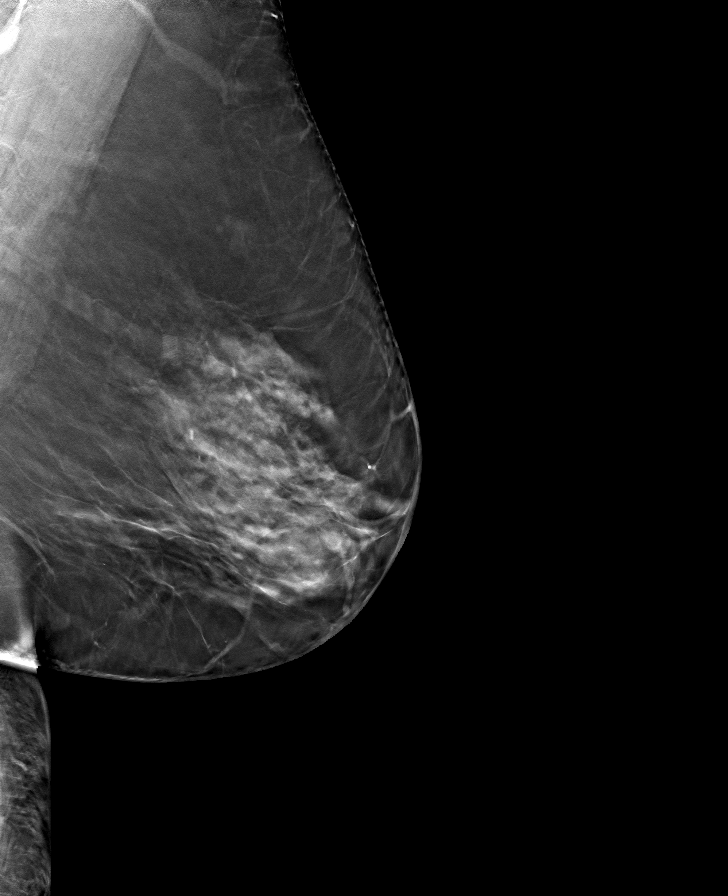

[R MLO tomo · tomo slice 41/80.0]
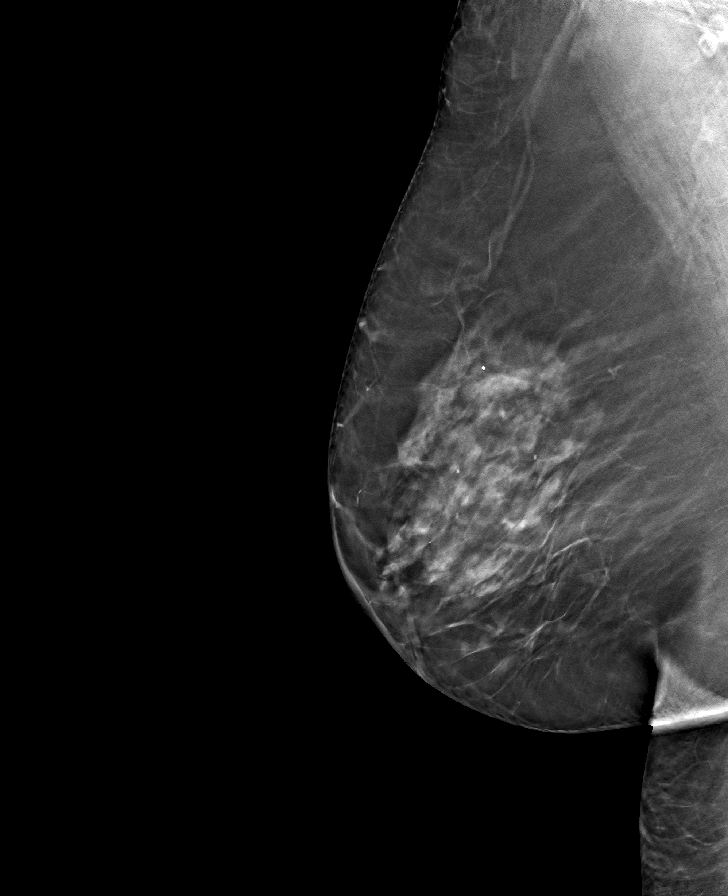

[8 of 24 positions shown; findings below may reference images not displayed]

ACR Breast Density Category c: The breast tissue is heterogeneously
dense, which may obscure small masses.
FINDINGS: There are no findings suspicious for malignancy. Images were
processed with CAD.
IMPRESSION: No mammographic evidence of malignancy. A result letter of this
screening mammogram will be mailed directly to the patient.

RECOMMENDATION:
Screening mammogram in one year. (Code:[id number])

BI-RADS CATEGORY  1: Negative.

## 2020-10-17 ENCOUNTER — Other Ambulatory Visit: Payer: Self-pay | Admitting: *Deleted

## 2020-10-17 ENCOUNTER — Telehealth: Payer: Self-pay | Admitting: Internal Medicine

## 2020-10-17 NOTE — Telephone Encounter (Signed)
Patient returning call for lab results. 

## 2020-10-17 NOTE — Telephone Encounter (Signed)
Patient called w/results - r/s visit to 10/23/20 with Dr. Debara Pickett

## 2020-10-17 NOTE — Telephone Encounter (Signed)
Spoke with the pot but unclear of her results based on Dr. Lysbeth Penner interpretation. I advised her that I will forward back to [place] his nurse for a call back and to go over the results and she agreed.

## 2020-10-22 ENCOUNTER — Other Ambulatory Visit: Payer: Self-pay | Admitting: Physician Assistant

## 2020-10-23 ENCOUNTER — Encounter: Payer: Self-pay | Admitting: Internal Medicine

## 2020-10-23 ENCOUNTER — Other Ambulatory Visit: Payer: Self-pay

## 2020-10-23 ENCOUNTER — Ambulatory Visit: Payer: BC Managed Care – PPO | Admitting: Internal Medicine

## 2020-10-23 VITALS — BP 140/77 | HR 81 | Ht 65.0 in | Wt 167.0 lb

## 2020-10-23 DIAGNOSIS — R5383 Other fatigue: Secondary | ICD-10-CM

## 2020-10-23 DIAGNOSIS — I255 Ischemic cardiomyopathy: Secondary | ICD-10-CM

## 2020-10-23 DIAGNOSIS — Z79899 Other long term (current) drug therapy: Secondary | ICD-10-CM | POA: Diagnosis not present

## 2020-10-23 DIAGNOSIS — E7849 Other hyperlipidemia: Secondary | ICD-10-CM

## 2020-10-23 MED ORDER — SACUBITRIL-VALSARTAN 24-26 MG PO TABS: 1.0000 | ORAL_TABLET | Freq: Two times a day (BID) | ORAL | 3 refills | Status: DC

## 2020-10-23 NOTE — Patient Instructions (Signed)
Medication Instructions:  STOP crestor for 2 weeks --  -- contact our office with update on symptoms -- if no improvement, resume crestor 20mg  -- if improvement MD will advise further  START entresto in 3-4 weeks (after statin holiday) -- you will STOP ramipril and then start this medication after off ramipril for 48 hours  *If you need a refill on your cardiac medications before your next appointment, please call your pharmacy*   Lab Work: BMET (non-fasting lab work) to be done 2 weeks after starting entresto  FASTING lipid panel in about 3-4 months (complete before your next visit)  If you have labs (blood work) drawn today and your tests are completely normal, you will receive your results only by: Marland Kitchen MyChart Message (if you have MyChart) OR . A paper copy in the mail If you have any lab test that is abnormal or we need to change your treatment, we will call you to review the results.   Testing/Procedures: NONE   Follow-Up: At [facility], you and your health needs are our priority.  As part of our continuing mission to provide you with exceptional heart care, we have created designated Provider Care Teams.  These Care Teams include your primary Cardiologist (physician) and Advanced Practice Providers (APPs -  Physician Assistants and Nurse Practitioners) who all work together to provide you with the care you need, when you need it.  We recommend signing up for the patient portal called "MyChart".  Sign up information is provided on this After Visit Summary.  MyChart is used to connect with patients for Virtual Visits (Telemedicine).  Patients are able to view lab/test results, encounter notes, upcoming appointments, etc.  Non-urgent messages can be sent to your provider as well.   To learn more about what you can do with MyChart, go to [email or web address].    Your next appointment:   3-4 month(s)  The format for your next appointment:   In Person  Provider:   K. Mali  Hilty, MD   Other Instructions

## 2020-10-23 NOTE — Progress Notes (Signed)
Chief Complaint:  Follow-up stress test  Primary Care Physician: Katrina Reid, PA-C  HPI:  Katrina Obrien is a 65 year old female who has a history of an unfortunate episode with left popliteal artery thrombus, PE, severe aortic stenosis which was ultimately diagnosed in a bicuspid aortic valve, as well as multivessel coronary disease, anterior MI, EF about 40% to 45% - all which came to a head about the same time. She underwent 3-vessel bypass as well as mechanical aortic valve replacement. She has done well on Coumadin and was ultimately diagnosed with diabetes and is followed by Dr. Chalmers Obrien for this. Now she is markedly improved, although she has an ischemic cardiomyopathy with an EF of 40% to 45%. Her activity level is pretty good.  We recently performed repeat lipid testing, which demonstrated an elevated LDL particle number of 2163. The calculated LDL was 80. I asked her to increase her Crestor to 40 mg at night and add 500 mg Niaspan. She reports significant flushing and intolerance to the Niaspan, despite taking aspirin 30 minutes prior.  Her laboratory work does look improved, with her particle number being reduced to 1547, LDL 55, HDL of 39.  She does report however worsening pain in her capsule when she walks that works its way up her legs. This is nothing as significant as when she was originally diagnosed with her acute arterial thrombus, but has concerns for claudication. Alternatively, this could represent myalgias from her statin.  Katrina Obrien returns today for followup. She is doing extremely well. She continues to be active and has no complaints of chest pain worsening shortness of breath. She does feel like her legs tire easily however this could be improved with more exercise. She denies any symptoms of claudication. Her INR has been therapeutic. She reports good blood sugar control and has an appointment with her endocrinologist in the next couple of weeks. We recently obtained  an echocardiogram which showed normalization of her EF to 55-60% in March of 2015. She's also had lower extremity arterial Dopplers which show preserved ABIs.  I the pleasure seeing Katrina Obrien back in the office today. She reports doing fairly well except occasionally she gets somewhat "swimmy headed". This is worse with change of position. As previously noted her EF has improved back to 55-60%. Recently she's had persistently elevated lipoprotein particles including an LDL particle number in the 1900s. This is despite being on Zetia and Crestor 40 mg. Based on this I recommended that she start on Pralulent. She's been taking this now for just over a month and is having no problems with injection site reactions or side effects. We will plan to check her cholesterol again in about 2 months.  Katrina Obrien was seen in the office today in follow-up. Overall she seems to be doing very well. 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 She will be due for repeat cholesterol test after the first of the year. She denies any chest pain or worsening shortness of breath. She is complaining of some occasional swelling in her lower extremities which improves in the morning. She also gets some pain in her right leg. She is overdue for repeat lower extremity Dopplers and does have a history of course of arterial thrombus in the leg. Warfarin has been therapeutic and managed by the Coumadin clinic. Cholesterol is still not at goal despite max dose therapy. For brief period of time she was on samples of Repatha (PCSK9 inhibitor) with a marked response in cholesterol,  however insurance would not cover this.  12/10/2015  Katrina Obrien returns today for follow-up. She says this is the best she has felt some time. She denies any chest pain or worsening shortness of breath. Recently her cholesterol numbers have risen with LDL now 102. This would put her not at goal therapy. Given her multiple comorbidities. Recent data indicates the  fact that driving LDL below 50 may improve cardiovascular outcomes. This was with the use of maximal statin therapy as well as the addition of a PCSK9 inhibitor, specifically Repatha. In fact, she had responded very well to Pralulent in the past, with LDL as low as 20 however that was not covered by insurance. 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 Aggressive therapy is warranted.  07/15/2016  Katrina Obrien returns today for follow-up. Overall she feels very well. She had one episode of intense burning in her chest which happened a few weeks ago. It was worse when she was laying down at night. She did not take any medicine for that she was concerned she could take it with her other medicines. It sounds like reflux she's not had any more symptoms like that. I told her she could use Tums or an H2 blocker for that as needed. She recently went on to [place] and her LDL cholesterol is now 19. This should be a significant benefit in slowing her overall cardiovascular risks. As mentioned previously she had an echo this past year which showed normal LVEF 55-60% and a normally functioning mechanical aortic valve. Her INR was therapeutic today.  07/09/2017  Katrina Obrien was seen today in follow-up.  She seems to be doing very well.  She reported that she had to shovel snow for several hours with her son and at the end of that did develop some chest tightness/soreness which went away very quickly.  She said she felt like she overworked herself, but generally has had no symptoms.  Recently we repeated a lipid profile which showed marked improvement in her LDL to 28 on statin, ezetimibe and Repatha.  Her triglycerides were elevated at 349, however she mentioned today that this was nonfasting.  Also her blood sugar was elevated recently.  She was started on Jardiance, which I suspect has made a big difference in her blood sugars.  She  says her hemoglobin A1c now is down to 6.1.  Accordingly, her triglycerides have probably improved as well.  Fortunately, her husband was not with her today.  Apparently he is suffering from depression and has had several bouts.  He is not interested in leaving the house and is not as engaged in activities as he has been in the past.  This is obviously causing her a lot of stress and is not clear to her how she can help him.  03/19/2018  Katrina Obrien returns today for follow-up.  She says she is felt today the best she has in years.  Unfortunately, she had a recent colonoscopy which turned out okay however having to come off all of her medications was challenging for her and she was not a fan of bridging Lovenox.  She does report her blood sugars been well controlled.  Her cholesterol is at goal with recent LDL of 58 however it is actually been lower with Repatha.  Her triglycerides are also well controlled at 110.  Her Saint Jude mechanical valve is functioning properly by echo in 2017 which showed normal LVEF.  She denies any chest pain symptoms with history of three-vessel CABG in 2012.  Blood pressure is at goal today as well.  EKG shows a stable left bundle branch block.  09/16/2018  Katrina Obrien is seen today in follow-up.  She is excited at the fact that she will be having a new grandbaby fairly soon.  Overall she is doing well without new symptoms.  She denies any chest pain or worsening shortness of breath.  Her INR was repeated today was 3.5.  No adjustments were made to her warfarin.  Her mechanical valve is working properly and was last assessed by echo last year.  She has lost some additional weight.  Her blood pressure was elevated little today however she has been under some stress and very recently took her medications.   09/28/2019  Katrina Obrien returns today for follow-up.  Overall she is doing well.  She said she has had both coronavirus vaccines.  She is back teaching in the classroom.  Unfortunately  she has been struggling with some weight gain.  This was due to stress and other issues with her father-in-law who ultimately died and also having to care for her husband.  Her triglycerides were elevated over the summer but LDL remained at goal.  Her A1c was 6.9 likely indicating that diabetes was a factor.  She is now working with a dietitian, exercising more regularly and eating better.  INR was 2.3 today which is slightly below her target of 2.5-3.5.  03/27/2020  Katrina Obrien returns today for follow-up.  Overall she continues to feel well.  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  She denies any chest pain or worsening shortness of breath.  She denies any symptoms of claudication.  Labs have been fairly stable.  Recent LDL remains below 70.  Triglycerides were a little elevated.  She is on Praluent and 40 mg of rosuvastatin.  Hemoglobin A1c was 8.1.  She is on Jardiance and Metformin.  She may need additional treatments if she is not able to reduce that further with diet.  The last echo showed stable valve gradients in December 2019.  10/04/2020  Katrina Obrien is seen today in follow-up.  She underwent a recent echocardiogram which surprisingly showed some decline in LVEF to 45 to 50%.  Is been a small increase in her aortic valve gradient to 16 mmHg.  The aorta is mildly dilated at 39 mm.  She reports that she has had a little bit more fatigue.  She gets short of breath but only with marked exertion.  She does have a left bundle branch block which has been chronic, however I wonder whether this might be contributing to her cardiomyopathy.  She also had COVID-19 despite being vaccinated and boosted just a couple months ago.  She feels like she is recovered from that however I wonder if that could have played a role in this cardiomyopathy as well.  She does have coronary artery disease with prior grafts and she could have some degree of ischemia.  She says  she does get some dyspnea and fatigue but no real anginal symptoms   10/23/2020  Katrina Obrien returns today for follow-up of her Myoview stress test.  I had decreased her Crestor because her LDL was actually quite low.  Surprisingly she reports some improvement in her fatigue.  Perhaps she was having some side effects from the statin.  Showed a possible small  area of anteroseptal ischemia but might be related to her left bundle branch block.  It was calculated as an EF of 28% but actually was more like 45 to 50%.  This is more consistent with her echo findings.  I do think there is some mild cardiomyopathy.  She denies any angina but does have still some fatigue and shortness of breath.  After some discussion today I felt less inclined to pursue cardiac catheterization given the small defect and the fact that she had some improvement in her symptoms off of the Crestor.  I advised actually that we take a 2-week statin holiday off the Crestor but she should continue her PCSK9 inhibitor.  If her symptoms are markedly improved then we will need to consider another option to reach her cholesterol targets.  Also I would advise we switch her ramipril to [facility] but start that after her statin holiday as not to confuse any potential side effects.  PMHx:  Past Medical History:  Diagnosis Date  . 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 Clotting disorder (HCC)    anticoagulant - coumadin hx DVT, PE  . Coronary artery disease    s/p 3- vessel bypass   . Diabetes mellitus without complication ([place])   . DVT (deep venous thrombosis) ([place])   . Heart murmur   . Hyperlipidemia   . Hypertension   . Ischemic cardiomyopathy    40 to 45%  . Left bundle branch block   . Mechanical heart valve present    Jan 2012  . Popliteal artery occlusion, left (HCC)    recent  status  post angioplasty  . Pulmonary embolism ([place])    associated  with DVT  . Pulmonary embolus ([place])   . Thyroid disease    hyperthyroidism    Past Surgical History:  Procedure Laterality Date  . AORTIC VALVE REPLACEMENT (AVR)/CORONARY ARTERY BYPASS GRAFTING (CABG)  08/16/2010  VAN TRIGT   AV replacement w/19-mm mechanical St Jude valve ([id number]),CABGX3  lima to LAD, SVG to distal circ,SVG to posterior descending  . ASD REPAIR    . CARDIAC CATHETERIZATION  08/08/2010   ,LAD 50% PROX,605 TANDEM SEGMENTAL STENOSIS,LEFT CIRC 60%-AV GROOVE, RGT COR 40% PROX ON THE BEND ,80% PROX PDA,  . CORONARY ARTERY BYPASS GRAFT  08/16/2010   LIMA TO lad,svg to distal circ ,svg to posterior descending  . DOPPLER ECHOCARDIOGRAPHY  08/09/2010   EF35% to 40% ,bbicuspid ;severely  thickened ,severely calcified leaflets, LV normal  . LEA doppler  08/07/2010   left popliteal occlusion ,and anterior tibial complete occlusion  . NM MYOCAR PERF WALL MOTION  08/06/2010   EF 31%  LOW RISK SCAN  . nuc    . PV ANGIOGRAM  08/08/2010   successful PTA LEFT POPLITEAL    FAMHx:  Family History  Problem Relation Age of Onset  . Cancer Mother   . Cancer Father   . CAD Father   . Heart attack Father 73       first MI, 2 major after that, multiple silent MIs  . Stroke Brother   . CAD Brother 65       first stent  . Heart attack Brother   . CAD Brother 69       CABG   . Hypertension Brother   . Heart attack Brother 55       died from MI  . Colon cancer Neg  Hx   . Esophageal cancer Neg Hx   . Rectal cancer Neg Hx   . Stomach cancer Neg Hx     SOCHx:   reports that she has never smoked. She has never used smokeless tobacco. She reports current alcohol use of about 7.0 standard drinks of alcohol per week. She reports that she does not use drugs.  ALLERGIES:  Allergies  Allergen Reactions  . Niaspan [Niacin Er] Other (See Comments)    Flushing, even with ASA    ROS: Pertinent items noted in HPI and remainder of comprehensive ROS otherwise negative.  HOME  MEDS: Current Outpatient Medications  Medication Sig Dispense Refill  . aspirin EC 81 MG tablet Take 81 mg by mouth daily.    . furosemide (LASIX) 40 MG tablet TAKE 1/2 TABLET BY MOUTH DAILY. 45 tablet 2  . JARDIANCE 25 MG TABS tablet Take 1 tablet (25 mg total) by mouth daily. 90 tablet 0  . levothyroxine (SYNTHROID) 50 MCG tablet TAKE 1 TABLET BY MOUTH DAILY BEFORE BREAKFAST. 90 tablet 0  . metFORMIN (GLUCOPHAGE-XR) 500 MG 24 hr tablet TAKE 1 TABLET BY MOUTH IN THE MORNING AND 2 TABLETS AT NIGHT. 270 tablet 0  . metoprolol succinate (TOPROL-XL) 100 MG 24 hr tablet TAKE 1 TABLET BY MOUTH DAILY. TAKE WITH OR IMMEDIATELY FOLLOWING A MEAL. 90 tablet 2  . PRALUENT 150 MG/ML SOAJ INJECT 1 DOSE INTO THE SKIN EVERY 14 DAYS 2 mL 11  . ramipril (ALTACE) 5 MG capsule TAKE 1 CAPSULE BY MOUTH DAILY. 90 capsule 0  . sacubitril-valsartan (ENTRESTO) 24-26 MG Take 1 tablet by mouth 2 (two) times daily. 180 tablet 3  . Semaglutide,0.25 or 0.5MG /DOS, (OZEMPIC, 0.25 OR 0.5 MG/DOSE,) 2 MG/1.5ML SOPN Inject 0.25 mg [place] once weekly x 4 weeks. Then inject 0.5 mg [place] once weekly. 3 mL 3  . VITAMIN D PO Take by mouth.    . warfarin (COUMADIN) 2 MG tablet TAKE 1 TO 1 AND 1/2 TABLETS BY MOUTH DAILY AS DIRECTED BY COUMADIN CLINIC. 120 tablet 1  . rosuvastatin (CRESTOR) 20 MG tablet Take 20 mg by mouth daily. (Patient not taking: Reported on 10/23/2020)     No current facility-administered medications for this visit.    LABS/IMAGING: No results found for this or any previous visit (from the past 48 hour(s)). No results found.  VITALS: BP 140/77   Pulse 81   Ht 5\' 5"  (1.651 m)   Wt 167 lb (75.8 kg)   LMP  (LMP Unknown)   SpO2 99%   BMI 27.79 kg/m   EXAM: Deferred  EKG: Deferred  ASSESSMENT: 1. Recent fatigue/dyspnea-LVEF 45 to 50% (09/2020) 2. Coronary artery disease status post 3 vessel CABG (LIMA to LAD, SVG to circumflex, SVG to PDA) - 07/2010 3. Severe aortic stenosis and a bicuspid aortic valve, status  post 19 mm St. Jude mechanical aortic valve excellent recent left popliteal artery occlusion with rest pain status post angioplasty (2012) 4. History of pulmonary emboli associated with DVT 5. Ischemic cardiomyopathy, EF now normalized at 55-60% 6. Hypertension - at goal 7. Familial hyperlipidemia - LDL>190 on Repatha 8. Diabetes type 2 - controlled 9. LBBB   PLAN: 1.   Mrs. Obrien had a mildly abnormal stress test which could be a small area of ischemia versus artifact related to her bundle branch block.  There was a significant gating abnormality and her EF calculated lower than it actually is.  By echo though there is some reduction in LV function.  I cannot rule out ischemia but she is not really having any angina.  She did however have some improvement in her fatigue after backing off on her rosuvastatin.  This makes me wonder whether or not she may have had some statin side effect.  The dose was decreased because she was lower than she needed to be with her lipids but I would say at this point recommend trying a 2-week statin holiday to see if her symptoms improve even further.  If there is no significant change then she should remain on the 20 mg dose.  I would also advise switching her low-dose ramipril to Entresto 24/26 mg twice daily given her cardiomyopathy.  She needs to washout the ramipril for at least 48 hours.  I would advise not starting the Entresto until after she has completed the statin holiday as to not possibly confuse side effects.  Plan follow-up with me in about 3 months to consider further titration of her Entresto.  Pixie Casino, MD, [facility], [place] Director of the Advanced Lipid Disorders &  Cardiovascular Risk Reduction Clinic Attending Cardiologist  Direct Dial: [phone]  Fax: [phone]  Website:  [email or web address] [email or web address] Quindon Denker 10/23/2020, 3:45 PM

## 2020-10-25 ENCOUNTER — Telehealth: Payer: Self-pay

## 2020-10-25 NOTE — Telephone Encounter (Signed)
**Note De-Identified Derwin Reddy Obfuscation** I started a Entresto PA through covermymeds. Key: [id number]

## 2020-10-25 NOTE — Telephone Encounter (Addendum)
**Note De-identified Connery Shiffler Obfuscation** -----  **Note De-Identified Mckynna Vanloan Obfuscation** Message from Fidel Levy, RN sent at 10/24/2020  2:50 PM EDT ----- Regarding: entresto PA Dr. Debara Pickett put this patient on Entresto 24/26mg  BID She was given co-pay card, free 30 day card She will not be starting for about 1 month though Will washout of ramipril  Key: [facility]

## 2020-10-29 ENCOUNTER — Other Ambulatory Visit: Payer: Self-pay | Admitting: Internal Medicine

## 2020-11-01 ENCOUNTER — Ambulatory Visit: Payer: BC Managed Care – PPO | Admitting: Physician Assistant

## 2020-11-19 ENCOUNTER — Ambulatory Visit (INDEPENDENT_AMBULATORY_CARE_PROVIDER_SITE_OTHER): Payer: BC Managed Care – PPO

## 2020-11-19 ENCOUNTER — Other Ambulatory Visit: Payer: Self-pay

## 2020-11-19 DIAGNOSIS — Z7901 Long term (current) use of anticoagulants: Secondary | ICD-10-CM

## 2020-11-19 DIAGNOSIS — Z952 Presence of prosthetic heart valve: Secondary | ICD-10-CM | POA: Diagnosis not present

## 2020-11-19 LAB — POCT INR: INR: 2.4 (ref 2.0–3.0)

## 2020-11-19 NOTE — Patient Instructions (Signed)
Take 2 tablets tonight only and then Continue with 1 tablet daily except 1.5 tablets each Monday, Wednesday and Friday.  Repeat INR in 6 weeks

## 2020-12-03 ENCOUNTER — Telehealth: Payer: Self-pay | Admitting: Physician Assistant

## 2020-12-03 NOTE — Telephone Encounter (Signed)
Error

## 2020-12-04 ENCOUNTER — Other Ambulatory Visit: Payer: Self-pay

## 2020-12-04 ENCOUNTER — Telehealth: Payer: Self-pay

## 2020-12-04 ENCOUNTER — Other Ambulatory Visit (INDEPENDENT_AMBULATORY_CARE_PROVIDER_SITE_OTHER): Payer: BC Managed Care – PPO

## 2020-12-04 DIAGNOSIS — Z7901 Long term (current) use of anticoagulants: Secondary | ICD-10-CM | POA: Diagnosis not present

## 2020-12-04 DIAGNOSIS — Z952 Presence of prosthetic heart valve: Secondary | ICD-10-CM | POA: Diagnosis not present

## 2020-12-04 NOTE — Telephone Encounter (Signed)
**Note De-identified Janeen Watson Obfuscation** Error

## 2020-12-04 NOTE — Telephone Encounter (Signed)
Patient approved for Entresto from 12/04/20 - 12/04/21

## 2020-12-04 NOTE — Telephone Encounter (Signed)
**Note De-Identified Blayze Haen Obfuscation** No determination received from covermymeds so I called [employer] at [phone] to f/u. Per Raquel Sarna they have approved this Entresto PA until 12/04/2021. PA #:[id number]  I have notified [facility] and the pt of this approval.

## 2020-12-05 LAB — BASIC METABOLIC PANEL
BUN/Creatinine Ratio: 32 — ABNORMAL HIGH (ref 12–28)
BUN: 29 mg/dL — ABNORMAL HIGH (ref 8–27)
CO2: 23 mmol/L (ref 20–29)
Calcium: 10.4 mg/dL — ABNORMAL HIGH (ref 8.7–10.3)
Chloride: 98 mmol/L (ref 96–106)
Creatinine, Ser: 0.91 mg/dL (ref 0.57–1.00)
Glucose: 178 mg/dL — ABNORMAL HIGH (ref 65–99)
Potassium: 4.6 mmol/L (ref 3.5–5.2)
Sodium: 139 mmol/L (ref 134–144)
eGFR: 70 mL/min/{1.73_m2} (ref >59)

## 2020-12-17 ENCOUNTER — Other Ambulatory Visit: Payer: Self-pay | Admitting: Physician Assistant

## 2020-12-17 DIAGNOSIS — E1169 Type 2 diabetes mellitus with other specified complication: Secondary | ICD-10-CM

## 2020-12-18 ENCOUNTER — Ambulatory Visit: Payer: BC Managed Care – PPO | Admitting: Physician Assistant

## 2020-12-31 ENCOUNTER — Other Ambulatory Visit: Payer: Self-pay

## 2020-12-31 ENCOUNTER — Ambulatory Visit (INDEPENDENT_AMBULATORY_CARE_PROVIDER_SITE_OTHER): Payer: BC Managed Care – PPO

## 2020-12-31 DIAGNOSIS — Z7901 Long term (current) use of anticoagulants: Secondary | ICD-10-CM | POA: Diagnosis not present

## 2020-12-31 LAB — POCT INR: INR: 2.5 (ref 2.0–3.0)

## 2020-12-31 NOTE — Patient Instructions (Signed)
Continue with 1 tablet daily except 1.5 tablets each Monday, Wednesday and Friday.  Repeat INR in 4 weeks

## 2021-01-02 ENCOUNTER — Telehealth: Payer: Self-pay | Admitting: Physician Assistant

## 2021-01-02 ENCOUNTER — Ambulatory Visit: Payer: BC Managed Care – PPO | Admitting: Physician Assistant

## 2021-01-02 ENCOUNTER — Other Ambulatory Visit: Payer: Self-pay

## 2021-01-02 ENCOUNTER — Encounter: Payer: Self-pay | Admitting: Physician Assistant

## 2021-01-02 VITALS — BP 129/74 | HR 82 | Temp 96.6°F | Ht 63.0 in | Wt 163.4 lb

## 2021-01-02 DIAGNOSIS — B351 Tinea unguium: Secondary | ICD-10-CM

## 2021-01-02 DIAGNOSIS — I152 Hypertension secondary to endocrine disorders: Secondary | ICD-10-CM

## 2021-01-02 DIAGNOSIS — E1169 Type 2 diabetes mellitus with other specified complication: Secondary | ICD-10-CM | POA: Diagnosis not present

## 2021-01-02 DIAGNOSIS — E1159 Type 2 diabetes mellitus with other circulatory complications: Secondary | ICD-10-CM | POA: Diagnosis not present

## 2021-01-02 DIAGNOSIS — E1165 Type 2 diabetes mellitus with hyperglycemia: Secondary | ICD-10-CM | POA: Diagnosis not present

## 2021-01-02 DIAGNOSIS — Z6828 Body mass index (BMI) 28.0-28.9, adult: Secondary | ICD-10-CM

## 2021-01-02 DIAGNOSIS — R4789 Other speech disturbances: Secondary | ICD-10-CM

## 2021-01-02 DIAGNOSIS — E785 Hyperlipidemia, unspecified: Secondary | ICD-10-CM

## 2021-01-02 DIAGNOSIS — E663 Overweight: Secondary | ICD-10-CM

## 2021-01-02 DIAGNOSIS — I255 Ischemic cardiomyopathy: Secondary | ICD-10-CM

## 2021-01-02 LAB — POCT GLYCOSYLATED HEMOGLOBIN (HGB A1C): Hemoglobin A1C: 7.5 % — AB (ref 4.0–5.6)

## 2021-01-02 MED ORDER — CICLOPIROX OLAMINE 0.77 % EX SUSP: CUTANEOUS | 1 refills | Status: DC

## 2021-01-02 NOTE — Patient Instructions (Signed)
Diabetes Mellitus and Foot Care Foot care is an important part of your health, especially when you have diabetes. Diabetes may cause you to have problems because of poor blood flow (circulation) to your feet and legs, which can cause your skin to:  Become thinner and drier.  Break more easily.  Heal more slowly.  Peel and crack. You may also have nerve damage (neuropathy) in your legs and feet, causing decreased feeling in them. This means that you may not notice minor injuries to your feet that could lead to more serious problems. Noticing and addressing any potential problems early is the best way to prevent future foot problems. How to care for your feet Foot hygiene  Wash your feet daily with warm water and mild soap. Do not use hot water. Then, pat your feet and the areas between your toes until they are completely dry. Do not soak your feet as this can dry your skin.  Trim your toenails straight across. Do not dig under them or around the cuticle. File the edges of your nails with an emery board or nail file.  Apply a moisturizing lotion or petroleum jelly to the skin on your feet and to dry, brittle toenails. Use lotion that does not contain alcohol and is unscented. Do not apply lotion between your toes.   Shoes and socks  Wear clean socks or stockings every day. Make sure they are not too tight. Do not wear knee-high stockings since they may decrease blood flow to your legs.  Wear shoes that fit properly and have enough cushioning. Always look in your shoes before you put them on to be sure there are no objects inside.  To break in new shoes, wear them for just a few hours a day. This prevents injuries on your feet. Wounds, scrapes, corns, and calluses  Check your feet daily for blisters, cuts, bruises, sores, and redness. If you cannot see the bottom of your feet, use a mirror or ask someone for help.  Do not cut corns or calluses or try to remove them with medicine.  If you  find a minor scrape, cut, or break in the skin on your feet, keep it and the skin around it clean and dry. You may clean these areas with mild soap and water. Do not clean the area with peroxide, alcohol, or iodine.  If you have a wound, scrape, corn, or callus on your foot, look at it several times a day to make sure it is healing and not infected. Check for: ? Redness, swelling, or pain. ? Fluid or blood. ? Warmth. ? Pus or a bad smell.   General tips  Do not cross your legs. This may decrease blood flow to your feet.  Do not use heating pads or hot water bottles on your feet. They may burn your skin. If you have lost feeling in your feet or legs, you may not know this is happening until it is too late.  Protect your feet from hot and cold by wearing shoes, such as at the beach or on hot pavement.  Schedule a complete foot exam at least once a year (annually) or more often if you have foot problems. Report any cuts, sores, or bruises to your health care provider immediately. Where to find more information  American Diabetes Association: www.diabetes.org  Association of Diabetes Care & Education Specialists: www.diabeteseducator.org Contact a health care provider if:  You have a medical condition that increases your risk of infection and   you have any cuts, sores, or bruises on your feet.  You have an injury that is not healing.  You have redness on your legs or feet.  You feel burning or tingling in your legs or feet.  You have pain or cramps in your legs and feet.  Your legs or feet are numb.  Your feet always feel cold.  You have pain around any toenails. Get help right away if:  You have a wound, scrape, corn, or callus on your foot and: ? You have pain, swelling, or redness that gets worse. ? You have fluid or blood coming from the wound, scrape, corn, or callus. ? Your wound, scrape, corn, or callus feels warm to the touch. ? You have pus or a bad smell coming from  the wound, scrape, corn, or callus. ? You have a fever. ? You have a red line going up your leg. Summary  Check your feet every day for blisters, cuts, bruises, sores, and redness.  Apply a moisturizing lotion or petroleum jelly to the skin on your feet and to dry, brittle toenails.  Wear shoes that fit properly and have enough cushioning.  If you have foot problems, report any cuts, sores, or bruises to your health care provider immediately.  Schedule a complete foot exam at least once a year (annually) or more often if you have foot problems. This information is not intended to replace advice given to you by your health care provider. Make sure you discuss any questions you have with your health care provider. Document Revised: 02/02/2020 Document Reviewed: 02/02/2020 Elsevier Patient Education  2021 [employer]vier Inc.  

## 2021-01-02 NOTE — Telephone Encounter (Signed)
Pharmacy has questions about ciclopirox and why it was selected and the directions. Thanks

## 2021-01-02 NOTE — Progress Notes (Signed)
Established Patient Office Visit  Subjective:  Patient ID: ACSA ESTEY, female    DOB: 09/12/1955  Age: 65 y.o. MRN: [id number]  CC:  Chief Complaint  Patient presents with  . Follow-up  . Diabetes  . Hypertension  . Hyperlipidemia    HPI Katrina Obrien presents for follow up on diabetes mellitus, hypertension and hyperlipidemia. Patient has c/o second left toe was red, swollen and tender last night. Symptoms have now resolved. Reports prior flare-ups. No hx of gout. Also reports an episode a few weeks ago where she had difficulty finding the word "curb". States she knew what it was but just could not say it. No family hx of dementia. States has trouble with remembering people's names which is her norm, not new. Denies facial dropping, slurred speech or paresthesia during episode.   Diabetes: Pt denies increased urination or thirst. Pt reports medication compliance. No hypoglycemic events. Checking glucose at home. FBS average <140. Reports one night was having trouble falling asleep and checked her sugar that night at 3:00 am and it was 178, rechecked in the morning and it was 147. States has experienced mild diarrhea and stomach cramps with certain foods she eats with Ozempic. Otherwise, tolerating without major issues. Is eating smaller meals and will sometimes need a snack in between. Continues to monitor carbohydrates and glucose intake.   HTN: Pt denies chest pain, palpitations, dizziness or edema. Taking medication as directed without side effects. Patient was changed from ramipril to [facility] by her cardiologist. Checks BP at home and readings range 120s/50-60s. Pt follows a low salt diet.   HLD: Pt is on a drug holiday with rosuvastatin. Cardiologist temp. stopped medication due to low LDL. Patient reports since stopping medication has noticed an improvement with her fatigue. Is able to come home after work and does not need to take a nap. Has a follow up visit to discuss  treatment therapy. Continues with Praluent.  Past Medical History:  Diagnosis Date  . 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 Clotting disorder (HCC)    anticoagulant - coumadin hx DVT, PE  . Coronary artery disease    s/p 3- vessel bypass   . Diabetes mellitus without complication ([place])   . DVT (deep venous thrombosis) ([place])   . Heart murmur   . Hyperlipidemia   . Hypertension   . Ischemic cardiomyopathy    40 to 45%  . Left bundle branch block   . Mechanical heart valve present    Jan 2012  . Popliteal artery occlusion, left (HCC)    recent  status  post angioplasty  . Pulmonary embolism ([place])    associated with DVT  . Pulmonary embolus ([place])   . Thyroid disease    hyperthyroidism    Past Surgical History:  Procedure Laterality Date  . AORTIC VALVE REPLACEMENT (AVR)/CORONARY ARTERY BYPASS GRAFTING (CABG)  08/16/2010  VAN TRIGT   AV replacement w/19-mm mechanical St Jude valve ([id number]),CABGX3  lima to LAD, SVG to distal circ,SVG to posterior descending  . ASD REPAIR    . CARDIAC CATHETERIZATION  08/08/2010   ,LAD 50% PROX,605 TANDEM SEGMENTAL STENOSIS,LEFT CIRC 60%-AV GROOVE, RGT COR 40% PROX ON THE BEND ,80% PROX PDA,  . CORONARY ARTERY BYPASS GRAFT  08/16/2010   LIMA TO lad,svg to distal circ ,svg to posterior descending  . DOPPLER ECHOCARDIOGRAPHY  08/09/2010   EF35% to 40% ,bbicuspid ;severely  thickened ,severely calcified leaflets, LV normal  . LEA doppler  08/07/2010   left popliteal occlusion ,and anterior tibial complete occlusion  . NM MYOCAR PERF WALL MOTION  08/06/2010   EF 31%  LOW RISK SCAN  . nuc    . PV ANGIOGRAM  08/08/2010   successful PTA LEFT POPLITEAL    Family History  Problem Relation Age of Onset  . Cancer Mother   . Cancer Father   . CAD Father   . Heart attack Father 68       first MI, 2 major after that, multiple silent MIs  . Stroke Brother   .  CAD Brother 48       first stent  . Heart attack Brother   . CAD Brother 43       CABG   . Hypertension Brother   . Heart attack Brother 45       died from MI  . Colon cancer Neg Hx   . Esophageal cancer Neg Hx   . Rectal cancer Neg Hx   . Stomach cancer Neg Hx     Social History   Socioeconomic History  . Marital status: Married    Spouse name: Not on file  . Number of children: Not on file  . Years of education: Not on file  . Highest education level: Not on file  Occupational History  . Not on file  Tobacco Use  . Smoking status: Never Smoker  . Smokeless tobacco: Never Used  Vaping Use  . Vaping Use: Never used  Substance and Sexual Activity  . Alcohol use: Yes    Alcohol/week: 7.0 standard drinks    Types: 7 Standard drinks or equivalent per week  . Drug use: No  . Sexual activity: Not Currently  Other Topics Concern  . Not on file  Social History Narrative  . Not on file   Social Determinants of Health   Financial Resource Strain: Not on file  Food Insecurity: Not on file  Transportation Needs: Not on file  Physical Activity: Not on file  Stress: Not on file  Social Connections: Not on file  Intimate Partner Violence: Not on file    Outpatient Medications Prior to Visit  Medication Sig Dispense Refill  . aspirin EC 81 MG tablet Take 81 mg by mouth daily.    . furosemide (LASIX) 40 MG tablet TAKE 1/2 TABLET BY MOUTH DAILY. 45 tablet 2  . JARDIANCE 25 MG TABS tablet Take 1 tablet (25 mg total) by mouth daily. 90 tablet 0  . levothyroxine (SYNTHROID) 50 MCG tablet TAKE 1 TABLET BY MOUTH DAILY BEFORE BREAKFAST. 90 tablet 0  . metFORMIN (GLUCOPHAGE-XR) 500 MG 24 hr tablet TAKE 1 TABLET BY MOUTH IN THE MORNING AND 2 TABLETS AT NIGHT. 270 tablet 0  . metoprolol succinate (TOPROL-XL) 100 MG 24 hr tablet TAKE 1 TABLET BY MOUTH DAILY. TAKE WITH OR IMMEDIATELY FOLLOWING A MEAL. 90 tablet 2  . PRALUENT 150 MG/ML SOAJ INJECT 1 DOSE INTO THE SKIN EVERY 14 DAYS 2 mL  11  . sacubitril-valsartan (ENTRESTO) 24-26 MG Take 1 tablet by mouth 2 (two) times daily. 180 tablet 3  . Semaglutide,0.25 or 0.5MG /DOS, (OZEMPIC, 0.25 OR 0.5 MG/DOSE,) 2 MG/1.5ML SOPN Inject 0.25 mg [place] once weekly x 4 weeks. Then inject 0.5 mg [place] once weekly. 3 mL 3  . VITAMIN D PO Take by mouth.    . warfarin (COUMADIN) 2 MG tablet TAKE 1 TO 1 AND 1/2 TABLETS BY  Established Patient Office Visit  Subjective:  Patient ID: ACSA ESTEY, female    DOB: 09/12/1955  Age: 65 y.o. MRN: [id number]  CC:  Chief Complaint  Patient presents with  . Follow-up  . Diabetes  . Hypertension  . Hyperlipidemia    HPI Katrina Obrien presents for follow up on diabetes mellitus, hypertension and hyperlipidemia. Patient has c/o second left toe was red, swollen and tender last night. Symptoms have now resolved. Reports prior flare-ups. No hx of gout. Also reports an episode a few weeks ago where she had difficulty finding the word "curb". States she knew what it was but just could not say it. No family hx of dementia. States has trouble with remembering people's names which is her norm, not new. Denies facial dropping, slurred speech or paresthesia during episode.   Diabetes: Pt denies increased urination or thirst. Pt reports medication compliance. No hypoglycemic events. Checking glucose at home. FBS average <140. Reports one night was having trouble falling asleep and checked her sugar that night at 3:00 am and it was 178, rechecked in the morning and it was 147. States has experienced mild diarrhea and stomach cramps with certain foods she eats with Ozempic. Otherwise, tolerating without major issues. Is eating smaller meals and will sometimes need a snack in between. Continues to monitor carbohydrates and glucose intake.   HTN: Pt denies chest pain, palpitations, dizziness or edema. Taking medication as directed without side effects. Patient was changed from ramipril to [facility] by her cardiologist. Checks BP at home and readings range 120s/50-60s. Pt follows a low salt diet.   HLD: Pt is on a drug holiday with rosuvastatin. Cardiologist temp. stopped medication due to low LDL. Patient reports since stopping medication has noticed an improvement with her fatigue. Is able to come home after work and does not need to take a nap. Has a follow up visit to discuss  treatment therapy. Continues with Praluent.  Past Medical History:  Diagnosis Date  . 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 Clotting disorder (HCC)    anticoagulant - coumadin hx DVT, PE  . Coronary artery disease    s/p 3- vessel bypass   . Diabetes mellitus without complication ([place])   . DVT (deep venous thrombosis) ([place])   . Heart murmur   . Hyperlipidemia   . Hypertension   . Ischemic cardiomyopathy    40 to 45%  . Left bundle branch block   . Mechanical heart valve present    Jan 2012  . Popliteal artery occlusion, left (HCC)    recent  status  post angioplasty  . Pulmonary embolism ([place])    associated with DVT  . Pulmonary embolus ([place])   . Thyroid disease    hyperthyroidism    Past Surgical History:  Procedure Laterality Date  . AORTIC VALVE REPLACEMENT (AVR)/CORONARY ARTERY BYPASS GRAFTING (CABG)  08/16/2010  VAN TRIGT   AV replacement w/19-mm mechanical St Jude valve ([id number]),CABGX3  lima to LAD, SVG to distal circ,SVG to posterior descending  . ASD REPAIR    . CARDIAC CATHETERIZATION  08/08/2010   ,LAD 50% PROX,605 TANDEM SEGMENTAL STENOSIS,LEFT CIRC 60%-AV GROOVE, RGT COR 40% PROX ON THE BEND ,80% PROX PDA,  . CORONARY ARTERY BYPASS GRAFT  08/16/2010   LIMA TO lad,svg to distal circ ,svg to posterior descending  . DOPPLER ECHOCARDIOGRAPHY  08/09/2010   EF35% to 40% ,bbicuspid ;severely  Completed)    Other Visit Diagnoses    Onychomycosis       Relevant Medications   ciclopirox (LOPROX) 0.77 % SUSP   Word finding difficulty       Overweight with body mass index (BMI) of 28 to 28.9 in adult         Cardiomyopathy, ischemic: -Followed by Cardiology. -Changed from Ramipril to Entresto 24-26 mg BID. -Echocardiogram 09/25/20: LVEF 45-50%, septal hypokinesis and dyskinesis consistent with LBBB -Nuclear stress test 10/10/20: LVEF 28%, There is a medium  sized, moderate severity, reversible perfusion defect present in the mid anterior and apical anterior location; medium sized, moderate severity, non-reversible defect present in the mid anteroseptal, apical septal and apex location.  Uncontrolled type 2 diabetes mellitus with hyperglycemia: -A1c has improved from 8.8 to 7.5, will continue current medication regimen. -Recommend to continue ambulatory glucose monitoring and low carbohydrate/glucose diet. -Will continue to monitor.  Hypertension associated with diabetes: -Controlled. -Continue current medication regimen. -Recommend to continue ambulatory BP monitoring and monitor for low blood pressure readings <100/60. -Will continue to monitor.  Hyperlipidemia associated with type 2 diabetes mellitus: -Recent lipid panel: total cholesterol 83, triglycerides 143, HDL 38, LDL 21 -Continue current medication regimen and follow up with Cardiology as scheduled. Discussed fatigue likely med side effect from statin. -Follow a heart healthy diet.  Onychomycosis: -Will start topical antifungal treatment with ciclopirox x 12 weeks. -Discussed with patient symptoms of redness, pain and swelling possibly gout related. Patient is on loop diuretic and side effect is hyperuricemia. Advised to schedule in-person visit if has another episode.    Overweight with body mass index (BMI) of 28 to 28.9 in adult: -Associated with diabetes mellitus, hypertension and hyperlipidemia. -Patient has lost 9 pounds since starting Ozempic at last visit. -Recommend to continue with smaller meals with intermittent snacks. -Will continue to monitor.  Word finding difficulty: -Recommend to monitor symptoms and will plan to do 6CIT screening at follow up visit. Cognitive impairment not common side effect with Entresto or Ozempic which are newer medications and patient not on statin during episode so less likely medication side effect. Advised if has recurrent episodes then  recommend obtaining imaging studies with head CT. Patient verbalized understanding.  Meds ordered this encounter  Medications  . ciclopirox (LOPROX) 0.77 % SUSP    Sig: Apply topically at bedtime. Remove with alcohol every 7 days.    Dispense:  30 mL    Refill:  1    Order Specific Question:   Supervising Provider    Answer:   Nani Gasser D [[id number]]    Follow-up: Return in about 3 months (around 04/04/2021) for DM, HTN, HLD.   Note:  This note was prepared with assistance of Dragon voice recognition software. Occasional wrong-word or sound-a-like substitutions may have occurred due to the inherent limitations of voice recognition software.  Mayer Masker, PA-C

## 2021-01-02 NOTE — Telephone Encounter (Signed)
Spoke with Colletta [place] and clarified the medication for [name]. Prescription was originally written as topical skin ointment but was corrected to nail solution by pharmacy.

## 2021-01-07 ENCOUNTER — Other Ambulatory Visit: Payer: Self-pay | Admitting: Physician Assistant

## 2021-01-24 ENCOUNTER — Other Ambulatory Visit: Payer: Self-pay

## 2021-01-24 ENCOUNTER — Other Ambulatory Visit: Payer: BC Managed Care – PPO

## 2021-01-25 LAB — LIPID PANEL
Chol/HDL Ratio: 5.7 ratio — ABNORMAL HIGH (ref 0.0–4.4)
Cholesterol, Total: 268 mg/dL — ABNORMAL HIGH (ref 100–199)
HDL: 47 mg/dL (ref >39)
LDL Chol Calc (NIH): 158 mg/dL — ABNORMAL HIGH (ref 0–99)
Triglycerides: 338 mg/dL — ABNORMAL HIGH (ref 0–149)
VLDL Cholesterol Cal: 63 mg/dL — ABNORMAL HIGH (ref 5–40)

## 2021-01-30 ENCOUNTER — Other Ambulatory Visit: Payer: Self-pay

## 2021-01-30 ENCOUNTER — Encounter: Payer: Self-pay | Admitting: Internal Medicine

## 2021-01-30 ENCOUNTER — Ambulatory Visit (INDEPENDENT_AMBULATORY_CARE_PROVIDER_SITE_OTHER): Payer: BC Managed Care – PPO

## 2021-01-30 ENCOUNTER — Ambulatory Visit: Payer: BC Managed Care – PPO | Admitting: Internal Medicine

## 2021-01-30 VITALS — BP 126/76 | HR 92 | Ht 63.0 in | Wt 163.8 lb

## 2021-01-30 DIAGNOSIS — Z951 Presence of aortocoronary bypass graft: Secondary | ICD-10-CM

## 2021-01-30 DIAGNOSIS — Z7901 Long term (current) use of anticoagulants: Secondary | ICD-10-CM | POA: Diagnosis not present

## 2021-01-30 DIAGNOSIS — Z952 Presence of prosthetic heart valve: Secondary | ICD-10-CM | POA: Diagnosis not present

## 2021-01-30 DIAGNOSIS — I255 Ischemic cardiomyopathy: Secondary | ICD-10-CM

## 2021-01-30 DIAGNOSIS — E7849 Other hyperlipidemia: Secondary | ICD-10-CM

## 2021-01-30 DIAGNOSIS — I447 Left bundle-branch block, unspecified: Secondary | ICD-10-CM

## 2021-01-30 LAB — POCT INR: INR: 2.8 (ref 2.0–3.0)

## 2021-01-30 MED ORDER — ATORVASTATIN CALCIUM 40 MG PO TABS: 40.0000 mg | ORAL_TABLET | Freq: Every day | ORAL | 3 refills | Status: DC

## 2021-01-30 NOTE — Progress Notes (Signed)
Chief Complaint:  Follow-up  Primary Care Physician: Lorrene Reid, PA-C  HPI:  Katrina Obrien is a 64 year old female who has a history of an unfortunate episode with left popliteal artery thrombus, PE, severe aortic stenosis which was ultimately diagnosed in a bicuspid aortic valve, as well as multivessel coronary disease, anterior MI, EF about 40% to 45% - all which came to a head about the same time. She underwent 3-vessel bypass as well as mechanical aortic valve replacement. She has done well on Coumadin and was ultimately diagnosed with diabetes and is followed by Dr. Chalmers Cater for this. Now she is markedly improved, although she has an ischemic cardiomyopathy with an EF of 40% to 45%. Her activity level is pretty good.  We recently performed repeat lipid testing, which demonstrated an elevated LDL particle number of 2163. The calculated LDL was 80. I asked her to increase her Crestor to 40 mg at night and add 500 mg Niaspan. She reports significant flushing and intolerance to the Niaspan, despite taking aspirin 30 minutes prior.  Her laboratory work does look improved, with her particle number being reduced to 1547, LDL 55, HDL of 39.  She does report however worsening pain in her capsule when she walks that works its way up her legs. This is nothing as significant as when she was originally diagnosed with her acute arterial thrombus, but has concerns for claudication. Alternatively, this could represent myalgias from her statin.  Katrina Obrien returns today for followup. She is doing extremely well. She continues to be active and has no complaints of chest pain worsening shortness of breath. She does feel like her legs tire easily however this could be improved with more exercise. She denies any symptoms of claudication. Her INR has been therapeutic. She reports good blood sugar control and has an appointment with her endocrinologist in the next couple of weeks. We recently obtained an  echocardiogram which showed normalization of her EF to 55-60% in March of 2015. She's also had lower extremity arterial Dopplers which show preserved ABIs.  I the pleasure seeing Young back in the office today. She reports doing fairly well except occasionally she gets somewhat "swimmy headed". This is worse with change of position. As previously noted her EF has improved back to 55-60%. Recently she's had persistently elevated lipoprotein particles including an LDL particle number in the 1900s. This is despite being on Zetia and Crestor 40 mg. Based on this I recommended that she start on Pralulent. She's been taking this now for just over a month and is having no problems with injection site reactions or side effects. We will plan to check her cholesterol again in about 2 months.  Katrina Obrien was seen in the office today in follow-up. Overall she seems to be doing very well. 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 She will be due for repeat cholesterol test after the first of the year. She denies any chest pain or worsening shortness of breath. She is complaining of some occasional swelling in her lower extremities which improves in the morning. She also gets some pain in her right leg. She is overdue for repeat lower extremity Dopplers and does have a history of course of arterial thrombus in the leg. Warfarin has been therapeutic and managed by the Coumadin clinic. Cholesterol is still not at goal despite max dose therapy. For brief period of time she was on samples of Repatha (PCSK9 inhibitor) with a marked response in cholesterol, however insurance  would not cover this.  12/10/2015  Katrina Obrien returns today for follow-up. She says this is the best she has felt some time. She denies any chest pain or worsening shortness of breath. Recently her cholesterol numbers have risen with LDL now 102. This would put her not at goal therapy. Given her multiple comorbidities. Recent data indicates the fact  that driving LDL below 50 may improve cardiovascular outcomes. This was with the use of maximal statin therapy as well as the addition of a PCSK9 inhibitor, specifically Repatha. In fact, she had responded very well to Pralulent in the past, with LDL as low as 20 however that was not covered by insurance. 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 Aggressive therapy is warranted.  07/15/2016  Katrina Obrien returns today for follow-up. Overall she feels very well. She had one episode of intense burning in her chest which happened a few weeks ago. It was worse when she was laying down at night. She did not take any medicine for that she was concerned she could take it with her other medicines. It sounds like reflux she's not had any more symptoms like that. I told her she could use Tums or an H2 blocker for that as needed. She recently went on to [place] and her LDL cholesterol is now 19. This should be a significant benefit in slowing her overall cardiovascular risks. As mentioned previously she had an echo this past year which showed normal LVEF 55-60% and a normally functioning mechanical aortic valve. Her INR was therapeutic today.  07/09/2017  Katrina Obrien was seen today in follow-up.  She seems to be doing very well.  She reported that she had to shovel snow for several hours with her son and at the end of that did develop some chest tightness/soreness which went away very quickly.  She said she felt like she overworked herself, but generally has had no symptoms.  Recently we repeated a lipid profile which showed marked improvement in her LDL to 28 on statin, ezetimibe and Repatha.  Her triglycerides were elevated at 349, however she mentioned today that this was nonfasting.  Also her blood sugar was elevated recently.  She was started on Jardiance, which I suspect has made a big difference in her blood sugars.  She says  her hemoglobin A1c now is down to 6.1.  Accordingly, her triglycerides have probably improved as well.  Fortunately, her husband was not with her today.  Apparently he is suffering from depression and has had several bouts.  He is not interested in leaving the house and is not as engaged in activities as he has been in the past.  This is obviously causing her a lot of stress and is not clear to her how she can help him.  03/19/2018  Katrina Obrien returns today for follow-up.  She says she is felt today the best she has in years.  Unfortunately, she had a recent colonoscopy which turned out okay however having to come off all of her medications was challenging for her and she was not a fan of bridging Lovenox.  She does report her blood sugars been well controlled.  Her cholesterol is at goal with recent LDL of 58 however it is actually been lower with Repatha.  Her triglycerides are also well controlled at 110.  Her Saint Jude mechanical valve is functioning properly by echo in 2017 which showed normal LVEF.  She denies  any chest pain symptoms with history of three-vessel CABG in 2012.  Blood pressure is at goal today as well.  EKG shows a stable left bundle branch block.  09/16/2018  Katrina Obrien is seen today in follow-up.  She is excited at the fact that she will be having a new grandbaby fairly soon.  Overall she is doing well without new symptoms.  She denies any chest pain or worsening shortness of breath.  Her INR was repeated today was 3.5.  No adjustments were made to her warfarin.  Her mechanical valve is working properly and was last assessed by echo last year.  She has lost some additional weight.  Her blood pressure was elevated little today however she has been under some stress and very recently took her medications.   09/28/2019  Katrina Obrien returns today for follow-up.  Overall she is doing well.  She said she has had both coronavirus vaccines.  She is back teaching in the classroom.  Unfortunately she has  been struggling with some weight gain.  This was due to stress and other issues with her father-in-law who ultimately died and also having to care for her husband.  Her triglycerides were elevated over the summer but LDL remained at goal.  Her A1c was 6.9 likely indicating that diabetes was a factor.  She is now working with a dietitian, exercising more regularly and eating better.  INR was 2.3 today which is slightly below her target of 2.5-3.5.  03/27/2020  Katrina Obrien returns today for follow-up.  Overall she continues to feel well.  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  She denies any chest pain or worsening shortness of breath.  She denies any symptoms of claudication.  Labs have been fairly stable.  Recent LDL remains below 70.  Triglycerides were a little elevated.  She is on Praluent and 40 mg of rosuvastatin.  Hemoglobin A1c was 8.1.  She is on Jardiance and Metformin.  She may need additional treatments if she is not able to reduce that further with diet.  The last echo showed stable valve gradients in December 2019.  10/04/2020  Katrina Obrien is seen today in follow-up.  She underwent a recent echocardiogram which surprisingly showed some decline in LVEF to 45 to 50%.  Is been a small increase in her aortic valve gradient to 16 mmHg.  The aorta is mildly dilated at 39 mm.  She reports that she has had a little bit more fatigue.  She gets short of breath but only with marked exertion.  She does have a left bundle branch block which has been chronic, however I wonder whether this might be contributing to her cardiomyopathy.  She also had COVID-19 despite being vaccinated and boosted just a couple months ago.  She feels like she is recovered from that however I wonder if that could have played a role in this cardiomyopathy as well.  She does have coronary artery disease with prior grafts and she could have some degree of ischemia.  She says she does  get some dyspnea and fatigue but no real anginal symptoms   10/23/2020  Katrina Obrien returns today for follow-up of her Myoview stress test.  I had decreased her Crestor because her LDL was actually quite low.  Surprisingly she reports some improvement in her fatigue.  Perhaps she was having some side effects from the statin.  Showed a possible small area of  anteroseptal ischemia but might be related to her left bundle branch block.  It was calculated as an EF of 28% but actually was more like 45 to 50%.  This is more consistent with her echo findings.  I do think there is some mild cardiomyopathy.  She denies any angina but does have still some fatigue and shortness of breath.  After some discussion today I felt less inclined to pursue cardiac catheterization given the small defect and the fact that she had some improvement in her symptoms off of the Crestor.  I advised actually that we take a 2-week statin holiday off the Crestor but she should continue her PCSK9 inhibitor.  If her symptoms are markedly improved then we will need to consider another option to reach her cholesterol targets.  Also I would advise we switch her ramipril to [facility] but start that after her statin holiday as not to confuse any potential side effects.  01/30/2021  Katrina Obrien is seen today in follow-up.  Unfortunately she has been under a lot of stress.  Her brother just died.  He also probably had FH with bypass surgery not too long after she had it and stents as well.  Apparently had sudden cardiac death and was found about 2 days later after he did not show up to work.  She has been struggling with this.  In addition she had stopped her Crestor because of potential side effects.  She did say that she felt a little better off the Crestor.  That being said her cholesterol has rebounded quite high.  Her total is now 268, HDL 47, LDL 158 and triglycerides 338.  She remains on Praluent.  She seems to be tolerating Entresto which we had  switched her to from an ACE inhibitor.  This is for LVEF 45 to 50%, suspect due to her left bundle branch block with a QRS D of 154 ms.  PMHx:  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   Clotting disorder (HCC)    anticoagulant - coumadin hx DVT, PE   Coronary artery disease    s/p 3- vessel bypass    Diabetes mellitus without complication (HCC)    DVT (deep venous thrombosis) (HCC)    Heart murmur    Hyperlipidemia    Hypertension    Ischemic cardiomyopathy    40 to 45%   Left bundle branch block    Mechanical heart valve present    Jan 2012   Popliteal artery occlusion, left (HCC)    recent  status  post angioplasty   Pulmonary embolism ([place])    associated with DVT   Pulmonary embolus ([place])    Thyroid disease    hyperthyroidism    Past Surgical History:  Procedure Laterality Date   AORTIC VALVE REPLACEMENT (AVR)/CORONARY ARTERY BYPASS GRAFTING (CABG)  08/16/2010  VAN TRIGT   AV replacement w/19-mm mechanical St Jude valve ([id number]),CABGX3  lima to LAD, SVG to distal circ,SVG to posterior descending   ASD REPAIR     CARDIAC CATHETERIZATION  08/08/2010   ,LAD 50% PROX,605 TANDEM SEGMENTAL STENOSIS,LEFT CIRC 60%-AV GROOVE, RGT COR 40% PROX ON THE BEND ,80% PROX PDA,   CORONARY ARTERY BYPASS GRAFT  08/16/2010   LIMA TO lad,svg to distal circ ,svg to posterior descending   DOPPLER ECHOCARDIOGRAPHY  08/09/2010   EF35% to 40% ,bbicuspid ;severely  thickened ,severely  calcified leaflets, LV normal   LEA doppler  08/07/2010   left popliteal occlusion ,and anterior tibial complete occlusion   NM MYOCAR PERF WALL MOTION  08/06/2010   EF 31%  LOW RISK SCAN   nuc     PV ANGIOGRAM  08/08/2010   successful PTA LEFT POPLITEAL    FAMHx:  Family History  Problem Relation Age of Onset   Cancer Mother    Cancer Father    CAD Father    Heart attack Father 91        first MI, 2 major after that, multiple silent MIs   Stroke Brother    CAD Brother 67       first stent   Heart attack Brother    CAD Brother 48       CABG    Hypertension Brother    Heart attack Brother 1       died from MI   Colon cancer Neg Hx    Esophageal cancer Neg Hx    Rectal cancer Neg Hx    Stomach cancer Neg Hx     SOCHx:   reports that she has never smoked. She has never used smokeless tobacco. She reports current alcohol use of about 7.0 standard drinks of alcohol per week. She reports that she does not use drugs.  ALLERGIES:  Allergies  Allergen Reactions   Niaspan [Niacin Er] Other (See Comments)    Flushing, even with ASA    ROS: Pertinent items noted in HPI and remainder of comprehensive ROS otherwise negative.  HOME MEDS: Current Outpatient Medications  Medication Sig Dispense Refill   aspirin EC 81 MG tablet Take 81 mg by mouth daily.     ciclopirox (LOPROX) 0.77 % SUSP Apply topically at bedtime. Remove with alcohol every 7 days. 30 mL 1   furosemide (LASIX) 40 MG tablet TAKE 1/2 TABLET BY MOUTH DAILY. 45 tablet 2   JARDIANCE 25 MG TABS tablet TAKE 1 TABLET BY MOUTH EVERY DAY 90 tablet 0   levothyroxine (SYNTHROID) 50 MCG tablet TAKE 1 TABLET BY MOUTH DAILY BEFORE BREAKFAST. 90 tablet 0   metFORMIN (GLUCOPHAGE-XR) 500 MG 24 hr tablet TAKE 1 TABLET BY MOUTH IN THE MORNING AND 2 TABLETS AT NIGHT. 270 tablet 0   metoprolol succinate (TOPROL-XL) 100 MG 24 hr tablet TAKE 1 TABLET BY MOUTH DAILY. TAKE WITH OR IMMEDIATELY FOLLOWING A MEAL. 90 tablet 2   PRALUENT 150 MG/ML SOAJ INJECT 1 DOSE INTO THE SKIN EVERY 14 DAYS 2 mL 11   sacubitril-valsartan (ENTRESTO) 24-26 MG Take 1 tablet by mouth 2 (two) times daily. 180 tablet 3   Semaglutide,0.25 or 0.5MG /DOS, (OZEMPIC, 0.25 OR 0.5 MG/DOSE,) 2 MG/1.5ML SOPN Inject 0.25 mg [place] once weekly x 4 weeks. Then inject 0.5 mg [place] once weekly. 3 mL 3   VITAMIN D PO Take by mouth.     warfarin (COUMADIN) 2 MG tablet TAKE  1 TO 1 AND 1/2 TABLETS BY MOUTH DAILY AS DIRECTED BY COUMADIN CLINIC. 120 tablet 1   No current facility-administered medications for this visit.    LABS/IMAGING: Results for orders placed or performed in visit on 01/30/21 (from the past 48 hour(s))  POCT INR     Status: None   Collection Time: 01/30/21  7:45 AM  Result Value Ref Range   INR 2.8 2.0 - 3.0   No results found.  VITALS: BP 126/76 (BP Location: Left Arm, Patient Position: Sitting, Cuff Size: Normal)   Pulse 92  Ht 5\' 3"  (1.6 m)   Wt 163 lb 12.8 oz (74.3 kg)   LMP  (LMP Unknown)   SpO2 98%   BMI 29.02 kg/m   EXAM: General appearance: alert and no distress Neck: no carotid bruit, no JVD, and thyroid not enlarged, symmetric, no tenderness/mass/nodules Lungs: clear to auscultation bilaterally Heart: regular rate and rhythm, S1, S2 normal, and systolic murmur: early systolic 2/6, crescendo at 2nd right intercostal space Abdomen: soft, non-tender; bowel sounds normal; no masses,  no organomegaly Extremities: extremities normal, atraumatic, no cyanosis or edema Pulses: 2+ and symmetric Skin: Skin color, texture, turgor normal. No rashes or lesions Neurologic: Grossly normal Psych: [place]  EKG: Normal sinus rhythm 92, LBBB, QRS D 154 ms-personally reviewed  ASSESSMENT: Recent fatigue/dyspnea-LVEF 45 to 50% (09/2020) Coronary artery disease status post 3 vessel CABG (LIMA to LAD, SVG to circumflex, SVG to PDA) - 07/2010 Severe aortic stenosis and a bicuspid aortic valve, status post 19 mm St. Jude mechanical aortic valve excellent recent left popliteal artery occlusion with rest pain status post angioplasty (2012) History of pulmonary emboli associated with DVT Ischemic cardiomyopathy, EF now normalized at 55-60% Hypertension - at goal Familial hyperlipidemia - LDL>190 on Repatha Diabetes type 2 - controlled LBBB   PLAN: 1.   Katrina Obrien seems to be tolerating Entresto but does not notice any significant  difference in her symptoms.  Unfortunately her brother died recently.  She did have to move a lot of his belongings and has had to walk up 6 flights of stairs which she was able to do.  She denies any angina.  No heart failure symptoms.  Her cholesterol however has gone up significantly after stopping Crestor.  I advised we switch to atorvastatin 40 mg daily and continue Praluent.  We will repeat lipids in about 3 months and will get a new echo at follow-up which should be about 6 months on Entresto.  Follow-up with me in 3 months.  Pixie Casino, MD, [facility], [place] Director of the Advanced Lipid Disorders &  Cardiovascular Risk Reduction Clinic Attending Cardiologist   Direct Dial: [phone]  Fax: [phone]  Website:  [email or web address]  Nadean Corwin Kloey Cazarez 01/30/2021, 8:28 AM

## 2021-01-30 NOTE — Patient Instructions (Signed)
Medication Instructions:  START atorvastatin 40mg  daily  *If you need a refill on your cardiac medications before your next appointment, please call your pharmacy*   Lab Work: FASTING lab work in 3 months to check cholesterol  -- please complete before your next visit  If you have labs (blood work) drawn today and your tests are completely normal, you will receive your results only by: [place] (if you have MyChart) OR A paper copy in the mail If you have any lab test that is abnormal or we need to change your treatment, we will call you to review the results.   Testing/Procedures: Your physician has requested that you have an echocardiogram. Echocardiography is a painless test that uses sound waves to create images of your heart. It provides your doctor with information about the size and shape of your heart and how well your heart's chambers and valves are working. This procedure takes approximately one hour. There are no restrictions for this procedure. -- [address] - [address]rd Floor -- due October 2022   Follow-Up: At [facility], you and your health needs are our priority.  As part of our continuing mission to provide you with exceptional heart care, we have created designated Provider Care Teams.  These Care Teams include your primary Cardiologist (physician) and Advanced Practice Providers (APPs -  Physician Assistants and Nurse Practitioners) who all work together to provide you with the care you need, when you need it.  We recommend signing up for the patient portal called "MyChart".  Sign up information is provided on this After Visit Summary.  MyChart is used to connect with patients for Virtual Visits (Telemedicine).  Patients are able to view lab/test results, encounter notes, upcoming appointments, etc.  Non-urgent messages can be sent to your provider as well.   To learn more about what you can do with MyChart, go to [email or web address].    Your next  appointment:   3 month(s) - after echo & labs  The format for your next appointment:   In Person  Provider:   You may see Pixie Casino, MD or one of the following Advanced Practice Providers on your designated Care Team:   Almyra Deforest, PA-C Fabian Sharp, PA-C or  Roby Lofts, [place]  ** OK to double book on a DOD day if needed  Other Instructions

## 2021-01-30 NOTE — Patient Instructions (Signed)
Continue with 1 tablet daily except 1.5 tablets each Monday, Wednesday and Friday.  Repeat INR in 6 weeks

## 2021-02-06 ENCOUNTER — Telehealth: Payer: Self-pay | Admitting: Physician Assistant

## 2021-02-06 ENCOUNTER — Encounter: Payer: Self-pay | Admitting: Physician Assistant

## 2021-02-06 ENCOUNTER — Other Ambulatory Visit: Payer: Self-pay

## 2021-02-06 ENCOUNTER — Ambulatory Visit: Payer: BC Managed Care – PPO | Admitting: Physician Assistant

## 2021-02-06 VITALS — BP 146/79 | HR 91 | Temp 98.4°F | Ht 63.0 in | Wt 161.4 lb

## 2021-02-06 DIAGNOSIS — R1030 Lower abdominal pain, unspecified: Secondary | ICD-10-CM | POA: Diagnosis not present

## 2021-02-06 DIAGNOSIS — R110 Nausea: Secondary | ICD-10-CM | POA: Diagnosis not present

## 2021-02-06 DIAGNOSIS — R197 Diarrhea, unspecified: Secondary | ICD-10-CM | POA: Diagnosis not present

## 2021-02-06 MED ORDER — ONDANSETRON HCL 4 MG PO TABS: 4.0000 mg | ORAL_TABLET | Freq: Three times a day (TID) | ORAL | 0 refills | Status: DC | PRN

## 2021-02-06 MED ORDER — CIPROFLOXACIN HCL 500 MG PO TABS: 500.0000 mg | ORAL_TABLET | Freq: Two times a day (BID) | ORAL | 0 refills | Status: AC

## 2021-02-06 NOTE — Telephone Encounter (Signed)
Patient has food poisoning and is having abdominal pain. Please advise, thanks.

## 2021-02-06 NOTE — Progress Notes (Signed)
Acute Office Visit  Subjective:    Patient ID: Katrina Obrien, female    DOB: 1955-11-07, 65 y.o.   MRN: [id number]  Chief Complaint  Patient presents with   Abdominal Pain    HPI Patient is in today for c/o of abdominal pain, diarrhea and nausea. Patient reports last Thursday (6 days ago) had a half [place] cranberry sandwich and a caesar salad for lunch.  Shortly after started having diarrhea. The following day did not feel well, felt tired and nauseous. Then developed cramping, chills, low-grade fever (100.3 F).  States Monday (2 days ago) had severe cramping at her lower abdomen, describes the pain as sharp, and was tender to touch. States pain was exacerbated with movement so rested most the day. Took Tylenol which provided mild-moderate pain relief. Reports stool was " thick black paste" but today is not the same color and no longer malodorous.  Yesterday was feeling some better but this morning felt worse, nauseous and continues to have cramping.  Has been staying hydrated drinking fluids (5-16 fl oz water bottles per day). No recurrent fever, denies vomiting, recent antibiotic use, or hematochezia.    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   Clotting disorder (HCC)    anticoagulant - coumadin hx DVT, PE   Coronary artery disease    s/p 3- vessel bypass    Diabetes mellitus without complication (HCC)    DVT (deep venous thrombosis) (HCC)    Heart murmur    Hyperlipidemia    Hypertension    Ischemic cardiomyopathy    40 to 45%   Left bundle branch block    Mechanical heart valve present    Jan 2012   Popliteal artery occlusion, left (HCC)    recent  status  post angioplasty   Pulmonary embolism (HCC)    associated with DVT   Pulmonary embolus (HCC)    Thyroid disease    hyperthyroidism    Past Surgical History:  Procedure Laterality Date    AORTIC VALVE REPLACEMENT (AVR)/CORONARY ARTERY BYPASS GRAFTING (CABG)  08/16/2010  VAN TRIGT   AV replacement w/19-mm mechanical St Jude valve ([id number]),CABGX3  lima to LAD, SVG to distal circ,SVG to posterior descending   ASD REPAIR     CARDIAC CATHETERIZATION  08/08/2010   ,LAD 50% PROX,605 TANDEM SEGMENTAL STENOSIS,LEFT CIRC 60%-AV GROOVE, RGT COR 40% PROX ON THE BEND ,80% PROX PDA,   CORONARY ARTERY BYPASS GRAFT  08/16/2010   LIMA TO lad,svg to distal circ ,svg to posterior descending   DOPPLER ECHOCARDIOGRAPHY  08/09/2010   EF35% to 40% ,bbicuspid ;severely  thickened ,severely calcified leaflets, LV normal   LEA doppler  08/07/2010   left popliteal occlusion ,and anterior tibial complete occlusion   NM MYOCAR PERF WALL MOTION  08/06/2010   EF 31%  LOW RISK SCAN   nuc     PV ANGIOGRAM  08/08/2010   successful PTA LEFT POPLITEAL    Family History  Problem Relation Age of Onset   Cancer Mother    Cancer Father    CAD Father    Heart attack Father 4       first MI, 2 major after that, multiple silent MIs   Stroke Brother    CAD Brother 29       first stent   Heart attack Brother    CAD Brother 82  CABG    Hypertension Brother    Heart attack Brother 45       died from MI   Colon cancer Neg Hx    Esophageal cancer Neg Hx    Rectal cancer Neg Hx    Stomach cancer Neg Hx     Social History   Socioeconomic History   Marital status: Married    Spouse name: Not on file   Number of children: Not on file   Years of education: Not on file   Highest education level: Not on file  Occupational History   Not on file  Tobacco Use   Smoking status: Never   Smokeless tobacco: Never  Vaping Use   Vaping Use: Never used  Substance and Sexual Activity   Alcohol use: Yes    Alcohol/week: 7.0 standard drinks    Types: 7 Standard drinks or equivalent per week   Drug use: No   Sexual activity: Not Currently  Other Topics Concern   Not on file  Social History Narrative    Not on file   Social Determinants of Health   Financial Resource Strain: Not on file  Food Insecurity: Not on file  Transportation Needs: Not on file  Physical Activity: Not on file  Stress: Not on file  Social Connections: Not on file  Intimate Partner Violence: Not on file    Outpatient Medications Prior to Visit  Medication Sig Dispense Refill   aspirin EC 81 MG tablet Take 81 mg by mouth daily.     atorvastatin (LIPITOR) 40 MG tablet Take 1 tablet (40 mg total) by mouth daily. 90 tablet 3   ciclopirox (LOPROX) 0.77 % SUSP Apply topically at bedtime. Remove with alcohol every 7 days. 30 mL 1   furosemide (LASIX) 40 MG tablet TAKE 1/2 TABLET BY MOUTH DAILY. 45 tablet 2   JARDIANCE 25 MG TABS tablet TAKE 1 TABLET BY MOUTH EVERY DAY 90 tablet 0   levothyroxine (SYNTHROID) 50 MCG tablet TAKE 1 TABLET BY MOUTH DAILY BEFORE BREAKFAST. 90 tablet 0   metFORMIN (GLUCOPHAGE-XR) 500 MG 24 hr tablet TAKE 1 TABLET BY MOUTH IN THE MORNING AND 2 TABLETS AT NIGHT. 270 tablet 0   metoprolol succinate (TOPROL-XL) 100 MG 24 hr tablet TAKE 1 TABLET BY MOUTH DAILY. TAKE WITH OR IMMEDIATELY FOLLOWING A MEAL. 90 tablet 2   PRALUENT 150 MG/ML SOAJ INJECT 1 DOSE INTO THE SKIN EVERY 14 DAYS 2 mL 11   sacubitril-valsartan (ENTRESTO) 24-26 MG Take 1 tablet by mouth 2 (two) times daily. 180 tablet 3   Semaglutide,0.25 or 0.5MG /DOS, (OZEMPIC, 0.25 OR 0.5 MG/DOSE,) 2 MG/1.5ML SOPN Inject 0.25 mg [place] once weekly x 4 weeks. Then inject 0.5 mg [place] once weekly. 3 mL 3   VITAMIN D PO Take by mouth.     warfarin (COUMADIN) 2 MG tablet TAKE 1 TO 1 AND 1/2 TABLETS BY MOUTH DAILY AS DIRECTED BY COUMADIN CLINIC. 120 tablet 1   No facility-administered medications prior to visit.    Allergies  Allergen Reactions   Niaspan [Niacin Er] Other (See Comments)    Flushing, even with ASA   Rosuvastatin Other (See Comments)    Fatigue, weakness    Review of Systems Review of Systems:  A fourteen system review of systems  was performed and found to be positive as per HPI. Objective:    Physical Exam Constitutional:      General: She is not in acute distress.    Appearance: She is not  toxic-appearing or diaphoretic.  HENT:     Head: Normocephalic and atraumatic.  Eyes:     Extraocular Movements: Extraocular movements intact.  Cardiovascular:     Rate and Rhythm: Normal rate and regular rhythm.  Pulmonary:     Effort: Pulmonary effort is normal.  Abdominal:     General: Abdomen is flat. Bowel sounds are increased. There is no distension.     Palpations: Abdomen is soft.     Tenderness: There is abdominal tenderness in the right lower quadrant and left lower quadrant. There is no guarding. Negative signs include Murphy's sign, Rovsing's sign and McBurney's sign.     Comments: Significant tenderness of LLQ vs RLQ.  Skin:    General: Skin is warm and dry.     Coloration: Skin is not jaundiced.     Findings: No rash.  Neurological:     General: No focal deficit present.     Mental Status: She is alert.  Psychiatric:        Mood and Affect: Mood normal.        Behavior: Behavior normal.   BP (!) 146/79   Pulse 91   Temp 98.4 F (36.9 C)   Ht 5\' 3"  (1.6 m)   Wt 161 lb 6.4 oz (73.2 kg)   LMP  (LMP Unknown)   SpO2 98%   BMI 28.59 kg/m  Wt Readings from Last 3 Encounters:  02/06/21 161 lb 6.4 oz (73.2 kg)  01/30/21 163 lb 12.8 oz (74.3 kg)  01/02/21 163 lb 6.4 oz (74.1 kg)    Health Maintenance Due  Topic Date Due   Zoster Vaccines- Shingrix (2 of 2) 07/06/2018   Pneumococcal Vaccine 30-105 Years old (2 - PPSV23 or PCV20) 05/12/2019   COVID-19 Vaccine (4 - Booster for Pfizer series) 08/11/2020   PAP SMEAR-Modifier  02/08/2021    There are no preventive care reminders to display for this patient.   Lab Results  Component Value Date   TSH 2.010 11/18/2019   Lab Results  Component Value Date   WBC 6.4 11/18/2019   HGB 14.0 11/18/2019   HCT 42.8 11/18/2019   MCV 92 11/18/2019   PLT  222 11/18/2019   Lab Results  Component Value Date   NA 139 12/04/2020   K 4.6 12/04/2020   CO2 23 12/04/2020   GLUCOSE 178 (H) 12/04/2020   BUN 29 (H) 12/04/2020   CREATININE 0.91 12/04/2020   BILITOT 0.3 09/20/2020   ALKPHOS 62 09/20/2020   AST 23 09/20/2020   ALT 35 (H) 09/20/2020   PROT 6.8 09/20/2020   ALBUMIN 4.8 09/20/2020   CALCIUM 10.4 (H) 12/04/2020   EGFR 70 12/04/2020   Lab Results  Component Value Date   CHOL 268 (H) 01/24/2021   Lab Results  Component Value Date   HDL 47 01/24/2021   Lab Results  Component Value Date   LDLCALC 158 (H) 01/24/2021   Lab Results  Component Value Date   TRIG 338 (H) 01/24/2021   Lab Results  Component Value Date   CHOLHDL 5.7 (H) 01/24/2021   Lab Results  Component Value Date   HGBA1C 7.5 (A) 01/02/2021       Assessment & Plan:   Problem List Items Addressed This Visit   None Visit Diagnoses     Lower abdominal pain    -  Primary   Diarrhea, unspecified type       Nausea          Lower abdominal  pain, Diarrhea: -Etiology unclear. DDX includes food-borne illness, infectious gastroenteritis, diverticulitis, IBD. Will place order for stat CT abdomen pelvis w/ contrast and collect labs (CBC w/d, CMP) and stool culture for further evaluation. Vital signs overall stable, BP mildly elevated. -Symptoms have been ongoing for >5 days with minimal improvement so will start empiric antibiotic therapy with ciprofloxacin which will cover for possible salmonella and diverticulitis. Discussed potential side effects with Warfarin and PT/INR. -Recommend to continue with hydration and follow a BRAT diet. Continue Tylenol as needed for pain relief. Take ondansetron as needed for nausea. -Pending labs and imaging results will adjust treatment plan if indicated.  No orders of the defined types were placed in this encounter.  Note:  This note was prepared with assistance of Dragon voice recognition software. Occasional wrong-word  or sound-a-like substitutions may have occurred due to the inherent limitations of voice recognition software.   Mayer Masker, PA-C

## 2021-02-06 NOTE — Patient Instructions (Signed)
Abdominal Pain, Adult Many things can cause belly (abdominal) pain. Most times, belly pain is not dangerous. Many cases of belly pain can be watched and treated at home. Sometimes, though, belly pain is serious. Yourdoctor will try to find the cause of your belly pain. Follow these instructions at home:  Medicines Take over-the-counter and prescription medicines only as told by your doctor. Do not take medicines that help you poop (laxatives) unless told by your doctor. General instructions Watch your belly pain for any changes. Drink enough fluid to keep your pee (urine) pale yellow. Keep all follow-up visits as told by your doctor. This is important. Contact a doctor if: Your belly pain changes or gets worse. You are not hungry, or you lose weight without trying. You are having trouble pooping (constipated) or have watery poop (diarrhea) for more than 2-3 days. You have pain when you pee or poop. Your belly pain wakes you up at night. Your pain gets worse with meals, after eating, or with certain foods. You are vomiting and cannot keep anything down. You have a fever. You have blood in your pee. Get help right away if: Your pain does not go away as soon as your doctor says it should. You cannot stop vomiting. Your pain is only in areas of your belly, such as the right side or the left lower part of the belly. You have bloody or black poop, or poop that looks like tar. You have very bad pain, cramping, or bloating in your belly. You have signs of not having enough fluid or water in your body (dehydration), such as: Dark pee, very little pee, or no pee. Cracked lips. Dry mouth. Sunken eyes. Sleepiness. Weakness. You have trouble breathing or chest pain. Summary Many cases of belly pain can be watched and treated at home. Watch your belly pain for any changes. Take over-the-counter and prescription medicines only as told by your doctor. Contact a doctor if your belly pain  changes or gets worse. Get help right away if you have very bad pain, cramping, or bloating in your belly. This information is not intended to replace advice given to you by your health care provider. Make sure you discuss any questions you have with your healthcare provider. Document Revised: 11/22/2018 Document Reviewed: 11/22/2018 Elsevier Patient Education  2022 Elsevier Inc.  

## 2021-02-07 ENCOUNTER — Other Ambulatory Visit: Payer: Self-pay | Admitting: Physician Assistant

## 2021-02-07 ENCOUNTER — Ambulatory Visit
Admission: RE | Admit: 2021-02-07 | Discharge: 2021-02-07 | Disposition: A | Discharge status: Home / Self Care | Payer: BC Managed Care – PPO | Source: Ambulatory Visit | Attending: Physician Assistant | Admitting: Physician Assistant

## 2021-02-07 ENCOUNTER — Telehealth: Payer: Self-pay | Admitting: Physician Assistant

## 2021-02-07 DIAGNOSIS — R197 Diarrhea, unspecified: Secondary | ICD-10-CM

## 2021-02-07 DIAGNOSIS — K5792 Diverticulitis of intestine, part unspecified, without perforation or abscess without bleeding: Secondary | ICD-10-CM

## 2021-02-07 DIAGNOSIS — R1030 Lower abdominal pain, unspecified: Secondary | ICD-10-CM

## 2021-02-07 LAB — COMPREHENSIVE METABOLIC PANEL
ALT: 62 IU/L — ABNORMAL HIGH (ref 0–32)
AST: 41 IU/L — ABNORMAL HIGH (ref 0–40)
Albumin/Globulin Ratio: 2 (ref 1.2–2.2)
Albumin: 4.7 g/dL (ref 3.8–4.8)
Alkaline Phosphatase: 95 IU/L (ref 44–121)
BUN/Creatinine Ratio: 30 — ABNORMAL HIGH (ref 12–28)
BUN: 23 mg/dL (ref 8–27)
Bilirubin Total: 0.5 mg/dL (ref 0.0–1.2)
CO2: 21 mmol/L (ref 20–29)
Calcium: 9.8 mg/dL (ref 8.7–10.3)
Chloride: 102 mmol/L (ref 96–106)
Creatinine, Ser: 0.76 mg/dL (ref 0.57–1.00)
Globulin, Total: 2.4 g/dL (ref 1.5–4.5)
Glucose: 122 mg/dL — ABNORMAL HIGH (ref 65–99)
Potassium: 4.8 mmol/L (ref 3.5–5.2)
Sodium: 139 mmol/L (ref 134–144)
Total Protein: 7.1 g/dL (ref 6.0–8.5)
eGFR: 87 mL/min/{1.73_m2} (ref >59)

## 2021-02-07 LAB — CBC WITH DIFFERENTIAL/PLATELET
Basophils Absolute: 0 10*3/uL (ref 0.0–0.2)
Basos: 0 %
EOS (ABSOLUTE): 0.1 10*3/uL (ref 0.0–0.4)
Eos: 1 %
Hematocrit: 39.5 % (ref 34.0–46.6)
Hemoglobin: 13.6 g/dL (ref 11.1–15.9)
Immature Grans (Abs): 0.1 10*3/uL (ref 0.0–0.1)
Immature Granulocytes: 1 %
Lymphocytes Absolute: 2.4 10*3/uL (ref 0.7–3.1)
Lymphs: 26 %
MCH: 31.3 pg (ref 26.6–33.0)
MCHC: 34.4 g/dL (ref 31.5–35.7)
MCV: 91 fL (ref 79–97)
Monocytes Absolute: 0.9 10*3/uL (ref 0.1–0.9)
Monocytes: 10 %
Neutrophils Absolute: 5.6 10*3/uL (ref 1.4–7.0)
Neutrophils: 62 %
Platelets: 309 10*3/uL (ref 150–450)
RBC: 4.34 x10E6/uL (ref 3.77–5.28)
RDW: 13.3 % (ref 11.7–15.4)
WBC: 9 10*3/uL (ref 3.4–10.8)

## 2021-02-07 MED ORDER — METRONIDAZOLE 500 MG PO TABS: 500.0000 mg | ORAL_TABLET | Freq: Three times a day (TID) | ORAL | 0 refills | Status: AC

## 2021-02-07 MED ORDER — CIPROFLOXACIN HCL 500 MG PO TABS: 500.0000 mg | ORAL_TABLET | Freq: Two times a day (BID) | ORAL | 0 refills | Status: AC

## 2021-02-07 MED ORDER — IOPAMIDOL (ISOVUE-300) INJECTION 61%
100.0000 mL | Freq: Once | INTRAVENOUS | Status: AC | PRN
  Administered 2021-02-07: 100 mL via INTRAVENOUS

## 2021-02-07 NOTE — Telephone Encounter (Signed)
Patient would like to know where her CT scan is. Please advise, thanks.

## 2021-02-07 NOTE — Telephone Encounter (Signed)
Patient is aware to go to [place] at [facility] and she will be worked in for [employer]. AS, CMA

## 2021-02-11 LAB — STOOL CULTURE: E coli, Shiga toxin Assay: NEGATIVE

## 2021-02-26 ENCOUNTER — Other Ambulatory Visit: Payer: Self-pay | Admitting: Internal Medicine

## 2021-03-13 ENCOUNTER — Ambulatory Visit (INDEPENDENT_AMBULATORY_CARE_PROVIDER_SITE_OTHER): Payer: BC Managed Care – PPO

## 2021-03-13 ENCOUNTER — Other Ambulatory Visit: Payer: Self-pay

## 2021-03-13 DIAGNOSIS — Z7901 Long term (current) use of anticoagulants: Secondary | ICD-10-CM

## 2021-03-13 DIAGNOSIS — Z952 Presence of prosthetic heart valve: Secondary | ICD-10-CM

## 2021-03-13 LAB — POCT INR: INR: 2.5 (ref 2.0–3.0)

## 2021-03-13 NOTE — Patient Instructions (Signed)
Continue with 1 tablet daily except 1.5 tablets each Monday, Wednesday and Friday.  Repeat INR in 7 weeks

## 2021-03-18 ENCOUNTER — Other Ambulatory Visit: Payer: Self-pay | Admitting: Physician Assistant

## 2021-03-18 DIAGNOSIS — E1169 Type 2 diabetes mellitus with other specified complication: Secondary | ICD-10-CM

## 2021-03-21 ENCOUNTER — Other Ambulatory Visit: Payer: Self-pay | Admitting: Physician Assistant

## 2021-04-04 ENCOUNTER — Ambulatory Visit (INDEPENDENT_AMBULATORY_CARE_PROVIDER_SITE_OTHER): Payer: BC Managed Care – PPO | Admitting: Physician Assistant

## 2021-04-04 ENCOUNTER — Other Ambulatory Visit: Payer: Self-pay

## 2021-04-04 ENCOUNTER — Encounter: Payer: Self-pay | Admitting: Physician Assistant

## 2021-04-04 VITALS — BP 118/68 | HR 73 | Temp 97.7°F | Ht 63.0 in | Wt 162.9 lb

## 2021-04-04 DIAGNOSIS — E1165 Type 2 diabetes mellitus with hyperglycemia: Secondary | ICD-10-CM | POA: Diagnosis not present

## 2021-04-04 DIAGNOSIS — E119 Type 2 diabetes mellitus without complications: Secondary | ICD-10-CM

## 2021-04-04 DIAGNOSIS — Z23 Encounter for immunization: Secondary | ICD-10-CM

## 2021-04-04 DIAGNOSIS — E785 Hyperlipidemia, unspecified: Secondary | ICD-10-CM

## 2021-04-04 DIAGNOSIS — E1169 Type 2 diabetes mellitus with other specified complication: Secondary | ICD-10-CM

## 2021-04-04 DIAGNOSIS — E1159 Type 2 diabetes mellitus with other circulatory complications: Secondary | ICD-10-CM | POA: Diagnosis not present

## 2021-04-04 DIAGNOSIS — I152 Hypertension secondary to endocrine disorders: Secondary | ICD-10-CM

## 2021-04-04 LAB — POCT GLYCOSYLATED HEMOGLOBIN (HGB A1C): Hemoglobin A1C: 6.9 % — AB (ref 4.0–5.6)

## 2021-04-04 NOTE — Assessment & Plan Note (Signed)
-  Continue current medication regimen. -Followed by cardiology. -Patient will be having lipid panel rechecked with cardiology next month.

## 2021-04-04 NOTE — Progress Notes (Signed)
Established Patient Office Visit  Subjective:  Patient ID: Katrina Obrien, female    DOB: 03-14-1956  Age: 65 y.o. MRN: [id number]  CC:  Chief Complaint  Patient presents with   Follow-up   Diabetes   Hypertension   Hyperlipidemia    HPI Katrina Obrien presents for follow up on diabetes mellitus, hypertension and hyperlipidemia.  Diabetes: Pt denies increased urination or thirst. Pt reports medication compliance. No hypoglycemic events. Checking glucose at home. FBS average 131-133. Denies diet changes. Patient has been under significant stress this summer.   HTN: Pt denies chest pain, palpitations, dizziness, shortness of breath or lower extremity swelling. Taking medication as directed without side effects. Checks BP at home and readings range 120s/70s. Pt follows a low salt diet. Continues to stay hydrated.  HLD: Pt was started on atorvastatin by Cardiology and reports taking medication as directed without issues. Denies side effects including myalgias, muscle weakness and RUQ pain.    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   Clotting disorder (HCC)    anticoagulant - coumadin hx DVT, PE   Coronary artery disease    s/p 3- vessel bypass    Diabetes mellitus without complication (HCC)    DVT (deep venous thrombosis) (HCC)    Heart murmur    Hyperlipidemia    Hypertension    Ischemic cardiomyopathy    40 to 45%   Left bundle branch block    Mechanical heart valve present    Jan 2012   Popliteal artery occlusion, left (HCC)    recent  status  post angioplasty   Pulmonary embolism (HCC)    associated with DVT   Pulmonary embolus (HCC)    Thyroid disease    hyperthyroidism    Past Surgical History:  Procedure Laterality Date   AORTIC VALVE REPLACEMENT (AVR)/CORONARY ARTERY BYPASS GRAFTING (CABG)  08/16/2010  VAN TRIGT   AV replacement  w/19-mm mechanical St Jude valve ([id number]),CABGX3  lima to LAD, SVG to distal circ,SVG to posterior descending   ASD REPAIR     CARDIAC CATHETERIZATION  08/08/2010   ,LAD 50% PROX,605 TANDEM SEGMENTAL STENOSIS,LEFT CIRC 60%-AV GROOVE, RGT COR 40% PROX ON THE BEND ,80% PROX PDA,   CORONARY ARTERY BYPASS GRAFT  08/16/2010   LIMA TO lad,svg to distal circ ,svg to posterior descending   DOPPLER ECHOCARDIOGRAPHY  08/09/2010   EF35% to 40% ,bbicuspid ;severely  thickened ,severely calcified leaflets, LV normal   LEA doppler  08/07/2010   left popliteal occlusion ,and anterior tibial complete occlusion   NM MYOCAR PERF WALL MOTION  08/06/2010   EF 31%  LOW RISK SCAN   nuc     PV ANGIOGRAM  08/08/2010   successful PTA LEFT POPLITEAL    Family History  Problem Relation Age of Onset   Cancer Mother    Cancer Father    CAD Father    Heart attack Father 1       first MI, 2 major after that, multiple silent MIs   Stroke Brother    CAD Brother 48       first stent   Heart attack Brother    CAD Brother 48       CABG    Hypertension Brother    Heart attack Brother 45       died from MI   Colon cancer Neg Hx  Esophageal cancer Neg Hx    Rectal cancer Neg Hx    Stomach cancer Neg Hx     Social History   Socioeconomic History   Marital status: Married    Spouse name: Not on file   Number of children: Not on file   Years of education: Not on file   Highest education level: Not on file  Occupational History   Not on file  Tobacco Use   Smoking status: Never   Smokeless tobacco: Never  Vaping Use   Vaping Use: Never used  Substance and Sexual Activity   Alcohol use: Yes    Alcohol/week: 7.0 standard drinks    Types: 7 Standard drinks or equivalent per week   Drug use: No   Sexual activity: Not Currently  Other Topics Concern   Not on file  Social History Narrative   Not on file   Social Determinants of Health   Financial Resource Strain: Not on file  Food Insecurity:  Not on file  Transportation Needs: Not on file  Physical Activity: Not on file  Stress: Not on file  Social Connections: Not on file  Intimate Partner Violence: Not on file    Outpatient Medications Prior to Visit  Medication Sig Dispense Refill   aspirin EC 81 MG tablet Take 81 mg by mouth daily.     atorvastatin (LIPITOR) 40 MG tablet Take 1 tablet (40 mg total) by mouth daily. 90 tablet 3   ciclopirox (LOPROX) 0.77 % SUSP Apply topically at bedtime. Remove with alcohol every 7 days. 30 mL 1   furosemide (LASIX) 40 MG tablet TAKE 1/2 TABLET BY MOUTH DAILY. 45 tablet 2   JARDIANCE 25 MG TABS tablet TAKE 1 TABLET BY MOUTH EVERY DAY 90 tablet 0   levothyroxine (SYNTHROID) 50 MCG tablet TAKE 1 TABLET BY MOUTH DAILY BEFORE BREAKFAST. 90 tablet 0   metFORMIN (GLUCOPHAGE-XR) 500 MG 24 hr tablet TAKE 1 TABLET BY MOUTH IN THE MORNING AND 2 TABLETS AT NIGHT. 270 tablet 0   metoprolol succinate (TOPROL-XL) 100 MG 24 hr tablet TAKE 1 TABLET BY MOUTH DAILY. TAKE WITH OR IMMEDIATELY FOLLOWING A MEAL. 90 tablet 2   ondansetron (ZOFRAN) 4 MG tablet Take 1 tablet (4 mg total) by mouth every 8 (eight) hours as needed for nausea or vomiting. 20 tablet 0   PRALUENT 150 MG/ML SOAJ INJECT 1 DOSE INTO THE SKIN EVERY 14 DAYS 150 mL 3   sacubitril-valsartan (ENTRESTO) 24-26 MG Take 1 tablet by mouth 2 (two) times daily. 180 tablet 3   Semaglutide,0.25 or 0.5MG /DOS, (OZEMPIC, 0.25 OR 0.5 MG/DOSE,) 2 MG/1.5ML SOPN Inject 0.25 mg [place] once weekly x 4 weeks. Then inject 0.5 mg [place] once weekly. 3 mL 3   VITAMIN D PO Take by mouth.     warfarin (COUMADIN) 2 MG tablet TAKE 1 TO 1 AND 1/2 TABLETS BY MOUTH DAILY AS DIRECTED BY COUMADIN CLINIC. 120 tablet 1   No facility-administered medications prior to visit.    Allergies  Allergen Reactions   Niaspan [Niacin Er] Other (See Comments)    Flushing, even with ASA   Rosuvastatin Other (See Comments)    Fatigue, weakness    ROS Review of Systems A fourteen system  review of systems was performed and found to be positive as per HPI.   Objective:    Physical Exam General:  Well Developed, well nourished, in no acute distress  Neuro:  Alert and oriented,  extra-ocular muscles intact  HEENT:  Normocephalic,  atraumatic, neck supple Skin:  no gross rash, warm, pink. Cardiac:  RRR, S1 S2 Respiratory:  CTA B/L, Not using accessory muscles, speaking in full sentences- unlabored. Vascular:  Ext warm, no cyanosis apprec.; cap RF less 2 sec. No edema  Psych:  No HI/SI, judgement and insight good, Euthymic mood. Full Affect.  BP 118/68   Pulse 73   Temp 97.7 F (36.5 C)   Ht 5\' 3"  (1.6 m)   Wt 162 lb 14.4 oz (73.9 kg)   LMP  (LMP Unknown)   SpO2 99%   BMI 28.86 kg/m  Wt Readings from Last 3 Encounters:  04/04/21 162 lb 14.4 oz (73.9 kg)  02/06/21 161 lb 6.4 oz (73.2 kg)  01/30/21 163 lb 12.8 oz (74.3 kg)     Health Maintenance Due  Topic Date Due   Zoster Vaccines- Shingrix (2 of 2) 07/06/2018   Pneumococcal Vaccine 92-32 Years old (2 - PPSV23 or PCV20) 05/12/2019   COVID-19 Vaccine (4 - Booster for Pfizer series) 08/03/2020   PAP SMEAR-Modifier  02/08/2021    There are no preventive care reminders to display for this patient.  Lab Results  Component Value Date   TSH 2.010 11/18/2019   Lab Results  Component Value Date   WBC 9.0 02/06/2021   HGB 13.6 02/06/2021   HCT 39.5 02/06/2021   MCV 91 02/06/2021   PLT 309 02/06/2021   Lab Results  Component Value Date   NA 139 02/06/2021   K 4.8 02/06/2021   CO2 21 02/06/2021   GLUCOSE 122 (H) 02/06/2021   BUN 23 02/06/2021   CREATININE 0.76 02/06/2021   BILITOT 0.5 02/06/2021   ALKPHOS 95 02/06/2021   AST 41 (H) 02/06/2021   ALT 62 (H) 02/06/2021   PROT 7.1 02/06/2021   ALBUMIN 4.7 02/06/2021   CALCIUM 9.8 02/06/2021   EGFR 87 02/06/2021   Lab Results  Component Value Date   CHOL 268 (H) 01/24/2021   Lab Results  Component Value Date   HDL 47 01/24/2021   Lab Results   Component Value Date   LDLCALC 158 (H) 01/24/2021   Lab Results  Component Value Date   TRIG 338 (H) 01/24/2021   Lab Results  Component Value Date   CHOLHDL 5.7 (H) 01/24/2021   Lab Results  Component Value Date   HGBA1C 6.9 (A) 04/04/2021      Assessment & Plan:   Problem List Items Addressed This Visit       Cardiovascular and Mediastinum   Hypertension associated with diabetes (HCC) (Chronic)    -Stable. -Continue current medication regimen. -Will continue to monitor.        Endocrine   Hyperlipidemia associated with type 2 diabetes mellitus (HCC) (Chronic)    -Continue current medication regimen. -Followed by cardiology. -Patient will be having lipid panel rechecked with cardiology next month.       Controlled type 2 diabetes mellitus without complication (HCC) - Primary    -A1c has improved from 7.5 to 6.9, improved and at goal. Will continue current medication regimen. -Continue ambulatory glucose monitoring and recommend to monitor for hypoglycemia. -Will continue to monitor.      Relevant Orders   POCT HgB A1C (Completed)   Other Visit Diagnoses     Need for influenza vaccination       Relevant Orders   Flu Vaccine QUAD 6+ mos PF IM (Fluarix Quad PF) (Completed)       No orders of the defined types were placed  in this encounter.   Follow-up: Return in about 3 months (around 07/04/2021) for DM, HTN, HLD.    Mayer Masker, PA-C

## 2021-04-04 NOTE — Assessment & Plan Note (Signed)
>>  ASSESSMENT AND PLAN FOR CONTROLLED TYPE 2 DIABETES MELLITUS WITHOUT COMPLICATION (HCC) WRITTEN ON 04/04/2021  5:19 PM BY ABONZA, MARITZA, PA-C  -A1c has improved from 7.5 to 6.9, improved and at goal. Will continue current medication regimen. -Continue ambulatory glucose monitoring and recommend to monitor for hypoglycemia. -Will continue to monitor.

## 2021-04-04 NOTE — Assessment & Plan Note (Signed)
-  Stable. -Continue current medication regimen.  -Will continue to monitor. 

## 2021-04-04 NOTE — Assessment & Plan Note (Signed)
-  A1c has improved from 7.5 to 6.9, improved and at goal. Will continue current medication regimen. -Continue ambulatory glucose monitoring and recommend to monitor for hypoglycemia. -Will continue to monitor.

## 2021-04-22 ENCOUNTER — Other Ambulatory Visit: Payer: Self-pay | Admitting: Internal Medicine

## 2021-04-26 ENCOUNTER — Other Ambulatory Visit: Payer: Self-pay | Admitting: Physician Assistant

## 2021-04-26 DIAGNOSIS — E1165 Type 2 diabetes mellitus with hyperglycemia: Secondary | ICD-10-CM

## 2021-04-29 ENCOUNTER — Other Ambulatory Visit: Payer: Self-pay

## 2021-04-29 ENCOUNTER — Ambulatory Visit (INDEPENDENT_AMBULATORY_CARE_PROVIDER_SITE_OTHER): Payer: BC Managed Care – PPO | Admitting: *Deleted

## 2021-04-29 ENCOUNTER — Ambulatory Visit (HOSPITAL_COMMUNITY): Payer: BC Managed Care – PPO | Attending: Cardiovascular Disease

## 2021-04-29 DIAGNOSIS — Z7901 Long term (current) use of anticoagulants: Secondary | ICD-10-CM | POA: Diagnosis not present

## 2021-04-29 DIAGNOSIS — Z952 Presence of prosthetic heart valve: Secondary | ICD-10-CM | POA: Diagnosis not present

## 2021-04-29 DIAGNOSIS — I255 Ischemic cardiomyopathy: Secondary | ICD-10-CM | POA: Diagnosis present

## 2021-04-29 LAB — ECHOCARDIOGRAM LIMITED
AR max vel: 0.98 cm2
AV Area VTI: 1.09 cm2
AV Area mean vel: 0.95 cm2
AV Mean grad: 12 mmHg
AV Peak grad: 22.3 mmHg
Ao pk vel: 2.36 m/s
Area-P 1/2: 3.12 cm2
S' Lateral: 4 cm

## 2021-04-29 LAB — POCT INR: INR: 3.1 — AB (ref 2.0–3.0)

## 2021-04-29 NOTE — Patient Instructions (Signed)
Description   Continue taking 1 tablet daily except 1.5 tablets each Monday, Wednesday and Friday. Repeat INR in 8 weeks.  [facility] [phone]      

## 2021-05-16 ENCOUNTER — Encounter: Payer: Self-pay | Admitting: Physician Assistant

## 2021-05-16 ENCOUNTER — Ambulatory Visit: Payer: BC Managed Care – PPO | Admitting: Physician Assistant

## 2021-05-16 ENCOUNTER — Other Ambulatory Visit: Payer: Self-pay

## 2021-05-16 VITALS — BP 130/76 | HR 78 | Temp 97.5°F | Ht 63.0 in | Wt 163.0 lb

## 2021-05-16 DIAGNOSIS — J0140 Acute pansinusitis, unspecified: Secondary | ICD-10-CM

## 2021-05-16 DIAGNOSIS — H6692 Otitis media, unspecified, left ear: Secondary | ICD-10-CM | POA: Diagnosis not present

## 2021-05-16 MED ORDER — BENZONATATE 100 MG PO CAPS
200.0000 mg | ORAL_CAPSULE | Freq: Three times a day (TID) | ORAL | 0 refills | Status: DC | PRN
Start: 2021-05-16 — End: 2021-07-10

## 2021-05-16 MED ORDER — AMOXICILLIN-POT CLAVULANATE 875-125 MG PO TABS
1.0000 | ORAL_TABLET | Freq: Two times a day (BID) | ORAL | 0 refills | Status: DC
Start: 2021-05-16 — End: 2021-07-10

## 2021-05-16 NOTE — Progress Notes (Signed)
Acute Office Visit  Subjective:    Patient ID: Katrina Obrien, female    DOB: [birth year], 65 y.o.   MRN: [id number]  Chief Complaint  Patient presents with   Acute Visit   Cough   Nasal Congestion   Sore Throat   Otitis Media    HPI Patient is in today for c/o dry cough, nasal congestion, sore throat, fatigue, runny nose, postnasal drainage, and left earache. Doing nasal lavage which provides temporary relief. No fever, rigors, chills, n/v/d, chest pain, or shortness of breath. Taking delsym and using nasal spray (Flonase). Symptoms started last Wednesday. Did an at-home Covid test 2 days ago, last Wednesday and Saturday which resulted negative.  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   Clotting disorder (HCC)    anticoagulant - coumadin hx DVT, PE   Coronary artery disease    s/p 3- vessel bypass    Diabetes mellitus without complication (HCC)    DVT (deep venous thrombosis) (HCC)    Heart murmur    Hyperlipidemia    Hypertension    Ischemic cardiomyopathy    40 to 45%   Left bundle branch block    Mechanical heart valve present    Jan 2012   Popliteal artery occlusion, left (HCC)    recent  status  post angioplasty   Pulmonary embolism (HCC)    associated with DVT   Pulmonary embolus (HCC)    Thyroid disease    hyperthyroidism    Past Surgical History:  Procedure Laterality Date   AORTIC VALVE REPLACEMENT (AVR)/CORONARY ARTERY BYPASS GRAFTING (CABG)  08/16/2010  VAN TRIGT   AV replacement w/19-mm mechanical St Jude valve ([id number]),CABGX3  lima to LAD, SVG to distal circ,SVG to posterior descending   ASD REPAIR     CARDIAC CATHETERIZATION  08/08/2010   ,LAD 50% PROX,605 TANDEM SEGMENTAL STENOSIS,LEFT CIRC 60%-AV GROOVE, RGT COR 40% PROX ON THE BEND ,80% PROX PDA,   CORONARY ARTERY BYPASS GRAFT  08/16/2010   LIMA TO lad,svg to distal circ  ,svg to posterior descending   DOPPLER ECHOCARDIOGRAPHY  08/09/2010   EF35% to 40% ,bbicuspid ;severely  thickened ,severely calcified leaflets, LV normal   LEA doppler  08/07/2010   left popliteal occlusion ,and anterior tibial complete occlusion   NM MYOCAR PERF WALL MOTION  08/06/2010   EF 31%  LOW RISK SCAN   nuc     PV ANGIOGRAM  08/08/2010   successful PTA LEFT POPLITEAL    Family History  Problem Relation Age of Onset   Cancer Mother    Cancer Father    CAD Father    Heart attack Father 48       first MI, 2 major after that, multiple silent MIs   Stroke Brother    CAD Brother 48       first stent   Heart attack Brother    CAD Brother 48       CABG    Hypertension Brother    Heart attack Brother 45       died from MI   Colon cancer Neg Hx    Esophageal cancer Neg Hx    Rectal cancer Neg Hx    Stomach cancer Neg Hx     Social History   Socioeconomic History   Marital status: Married    Spouse name: Not on file  Number of children: Not on file   Years of education: Not on file   Highest education level: Not on file  Occupational History   Not on file  Tobacco Use   Smoking status: Never   Smokeless tobacco: Never  Vaping Use   Vaping Use: Never used  Substance and Sexual Activity   Alcohol use: Yes    Alcohol/week: 7.0 standard drinks    Types: 7 Standard drinks or equivalent per week   Drug use: No   Sexual activity: Not Currently  Other Topics Concern   Not on file  Social History Narrative   Not on file   Social Determinants of Health   Financial Resource Strain: Not on file  Food Insecurity: Not on file  Transportation Needs: Not on file  Physical Activity: Not on file  Stress: Not on file  Social Connections: Not on file  Intimate Partner Violence: Not on file    Outpatient Medications Prior to Visit  Medication Sig Dispense Refill   aspirin EC 81 MG tablet Take 81 mg by mouth daily.     ciclopirox (LOPROX) 0.77 % SUSP Apply  topically at bedtime. Remove with alcohol every 7 days. 30 mL 1   furosemide (LASIX) 40 MG tablet TAKE 1/2 TABLET BY MOUTH DAILY. 45 tablet 2   JARDIANCE 25 MG TABS tablet TAKE 1 TABLET BY MOUTH EVERY DAY 90 tablet 0   levothyroxine (SYNTHROID) 50 MCG tablet TAKE 1 TABLET BY MOUTH DAILY BEFORE BREAKFAST. 90 tablet 0   metFORMIN (GLUCOPHAGE-XR) 500 MG 24 hr tablet TAKE 1 TABLET BY MOUTH IN THE MORNING AND 2 TABLETS AT NIGHT. 270 tablet 0   metoprolol succinate (TOPROL-XL) 100 MG 24 hr tablet TAKE 1 TABLET BY MOUTH DAILY. TAKE WITH OR IMMEDIATELY FOLLOWING A MEAL. 90 tablet 2   ondansetron (ZOFRAN) 4 MG tablet Take 1 tablet (4 mg total) by mouth every 8 (eight) hours as needed for nausea or vomiting. 20 tablet 0   PRALUENT 150 MG/ML SOAJ INJECT 1 DOSE INTO THE SKIN EVERY 14 DAYS 150 mL 3   sacubitril-valsartan (ENTRESTO) 24-26 MG Take 1 tablet by mouth 2 (two) times daily. 180 tablet 3   Semaglutide,0.25 or 0.5MG /DOS, (OZEMPIC, 0.25 OR 0.5 MG/DOSE,) 2 MG/1.5ML SOPN INJECT 0.25 MG SUBCUTANEOUSLY ONCE WEEKLY FOR 4 WEEKS, THEN 0.5 MG ONCE WEEKLY. 1.5 mL 3   VITAMIN D PO Take by mouth.     warfarin (COUMADIN) 2 MG tablet TAKE 1 TO 1 AND 1/2 TABLETS BY MOUTH DAILY AS DIRECTED BY COUMADIN CLINIC. 120 tablet 1   atorvastatin (LIPITOR) 40 MG tablet Take 1 tablet (40 mg total) by mouth daily. 90 tablet 3   No facility-administered medications prior to visit.    Allergies  Allergen Reactions   Niaspan [Niacin Er] Other (See Comments)    Flushing, even with ASA   Rosuvastatin Other (See Comments)    Fatigue, weakness    Review of Systems Review of Systems:  A fourteen system review of systems was performed and found to be positive as per HPI.  Objective:    Physical Exam Constitutional:      General: She is not in acute distress. HENT:     Head: Normocephalic and atraumatic.     Comments: Tenderness of maxillary and ethmoid/sphenoid sinus    Right Ear: A middle ear effusion is present.      Ears:     Comments: Evidence of TM rupture with bloody drainage    Nose: Congestion present.  Mouth/Throat:     Mouth: Mucous membranes are moist. No oral lesions.     Pharynx: Posterior oropharyngeal erythema present. No oropharyngeal exudate.  Eyes:     Pupils: Pupils are equal, round, and reactive to light.  Cardiovascular:     Rate and Rhythm: Normal rate and regular rhythm.  Pulmonary:     Effort: Pulmonary effort is normal. No respiratory distress.     Breath sounds: Rhonchi present. No wheezing or rales.  Chest:     Chest wall: No tenderness.  Musculoskeletal:     Cervical back: Normal range of motion and neck supple.  Skin:    General: Skin is warm and dry.  Neurological:     General: No focal deficit present.     Mental Status: She is alert.  Psychiatric:        Mood and Affect: Mood normal.        Behavior: Behavior normal.     BP 130/76   Pulse 78   Temp (!) 97.5 F (36.4 C)   Ht 5\' 3"  (1.6 m)   Wt 163 lb (73.9 kg)   LMP  (LMP Unknown)   SpO2 98%   BMI 28.87 kg/m  Wt Readings from Last 3 Encounters:  05/16/21 163 lb (73.9 kg)  04/04/21 162 lb 14.4 oz (73.9 kg)  02/06/21 161 lb 6.4 oz (73.2 kg)    Health Maintenance Due  Topic Date Due   Zoster Vaccines- Shingrix (2 of 2) 07/06/2018   Pneumococcal Vaccine 48-52 Years old (2 - PPSV23 if available, else PCV20) 05/12/2019   COVID-19 Vaccine (4 - Booster for Pfizer series) 07/06/2020   PAP SMEAR-Modifier  02/08/2021    There are no preventive care reminders to display for this patient.   Lab Results  Component Value Date   TSH 2.010 11/18/2019   Lab Results  Component Value Date   WBC 9.0 02/06/2021   HGB 13.6 02/06/2021   HCT 39.5 02/06/2021   MCV 91 02/06/2021   PLT 309 02/06/2021   Lab Results  Component Value Date   NA 139 02/06/2021   K 4.8 02/06/2021   CO2 21 02/06/2021   GLUCOSE 122 (H) 02/06/2021   BUN 23 02/06/2021   CREATININE 0.76 02/06/2021   BILITOT 0.5 02/06/2021    ALKPHOS 95 02/06/2021   AST 41 (H) 02/06/2021   ALT 62 (H) 02/06/2021   PROT 7.1 02/06/2021   ALBUMIN 4.7 02/06/2021   CALCIUM 9.8 02/06/2021   EGFR 87 02/06/2021   Lab Results  Component Value Date   CHOL 268 (H) 01/24/2021   Lab Results  Component Value Date   HDL 47 01/24/2021   Lab Results  Component Value Date   LDLCALC 158 (H) 01/24/2021   Lab Results  Component Value Date   TRIG 338 (H) 01/24/2021   Lab Results  Component Value Date   CHOLHDL 5.7 (H) 01/24/2021   Lab Results  Component Value Date   HGBA1C 6.9 (A) 04/04/2021       Assessment & Plan:   Problem List Items Addressed This Visit   None Visit Diagnoses     Left otitis media, unspecified otitis media type    -  Primary   Relevant Medications   amoxicillin-clavulanate (AUGMENTIN) 875-125 MG tablet   Acute non-recurrent pansinusitis       Relevant Medications   amoxicillin-clavulanate (AUGMENTIN) 875-125 MG tablet   benzonatate (TESSALON) 100 MG capsule      Acute non-recurrent pansinusitis, left otitis media: -  Patient has s/s suggestive of sinusitis and left otitis media with symptoms ongoing >7 days with minimal improvement with home supportive care so will start antibiotic therapy with Augmentin 875-125 mg BID x 10 days. Recommend to continue home supportive care, monitor for worsening symptoms and take tessalon Perles as needed for cough. If symptoms fail to improve or worsen recommend obtaining chest x-ray. Patient verbalized understanding.   Meds ordered this encounter  Medications   amoxicillin-clavulanate (AUGMENTIN) 875-125 MG tablet    Sig: Take 1 tablet by mouth 2 (two) times daily.    Dispense:  20 tablet    Refill:  0    Order Specific Question:   Supervising Provider    Answer:   Nani Gasser D [[id number]]   benzonatate (TESSALON) 100 MG capsule    Sig: Take 2 capsules (200 mg total) by mouth 3 (three) times daily as needed for cough.    Dispense:  30 capsule    Refill:  0     Order Specific Question:   Supervising Provider    Answer:   Nani Gasser D [[id number]]     Mayer Masker, PA-C

## 2021-05-16 NOTE — Patient Instructions (Signed)
Eardrum Rupture, Adult An eardrum rupture is a hole (perforation) in the eardrum. The eardrum is a thin, round tissue inside of the ear that separates the ear canal from the middle ear. The eardrum is also called the tympanic membrane. It transfers sound vibrations through small bones in the middle ear to the hearing nerve in the inner ear. It also protects the middle ear from germs. An eardrum rupture can cause pain and hearing loss. What are the causes? This condition may be caused by: An infection. A sudden injury, such as from: Inserting a thin, sharp object into the ear. A hit to the side of the head, especially by an open hand. Falling onto water or a flat surface. A rapid change in pressure, such as from flying or scuba diving. A sudden increase in pressure against the eardrum, such as from an explosion or a very loud noise. Inserting a cotton-tipped swab in the ear. A long-term eustachian tube disorder. Eustachian tubes are parts of the body that connect each middle ear space to the back of the nose. A medical procedure or surgery, such as a procedure to remove wax from the ear canal. Removing a pressure equalization tube(PE tube) that was surgically placed through the eardrum. Having a PE tube fall out. What increases the risk? You are more likely to develop this condition if: You have had PE tubes inserted in your ears. You have an ear infection. You play sports that: Involve balls or contact with other players. Take place in water, such as diving, scuba diving, or waterskiing. What are the signs or symptoms? Symptoms of this condition include: Sudden pain at the time of the injury. Ear pain that suddenly improves. Ringing in the ear after the injury. Drainage from the ear. The drainage may be clear, cloudy or pus-like, or bloody. Hearing loss. Dizziness. How is this diagnosed? This condition is diagnosed based on your symptoms and medical history as well as a physical exam.  Your health care provider can usually see a perforation using an ear scope (otoscope). You may have tests, such as: A hearing test (audiogram) to check for hearing loss. A test in which a sample of ear drainage is tested for infection (culture). How is this treated? An eardrum typically heals on its own within a few weeks. If your eardrum does not heal, your health care provider may recommend a procedure to place a patch over your eardrum or surgery to repair your eardrum. Your health care provider may also prescribe antibiotic medicines to help prevent infection. If the ear heals completely, any hearing loss should be temporary. Follow these instructions at home: Medicines Take over-the-counter and prescription medicines only as told by your health care provider. If you were prescribed an antibiotic medicine, use it as told by your health care provider. Do not stop using the antibiotic even if you start to feel better. Ear care Keep your ear dry. This is very important. Follow instructions from your health care provider about how to keep your ear dry. You may need to wear waterproof earplugs when bathing and swimming. If directed, apply heat to your affected ear as often as told by your health care provider. Use the heat source that your health care provider recommends, such as a moist heat pack or a heating pad. This will help to relieve pain. Place a towel between your skin and the heat source. Leave the heat on for 20-30 minutes. Remove the heat if your skin turns bright red. This  is especially important if you are unable to feel pain, heat, or cold. You have a greater risk of getting burned. General instructions Return to sports and activities as told by your health care provider. Ask your health care provider what activities are safe for you. Wear headgear with ear protection when you play sports in which ear injuries are common. Talk to your health care provider before traveling by  plane. Keep all follow-up visits. This is important. Contact a health care provider if: You have a fever. You have ear pain. You have mucus or blood draining from your ear. You have hearing loss, dizziness, or ringing in your ear. Get help right away if: You have sudden hearing loss. You are very dizzy. You have severe ear pain. Your face feels weak or becomes limp (paralyzed). These symptoms may represent a serious problem that is an emergency. Do not wait to see if the symptoms will go away. Get medical help right away. Call your local emergency services (911 in the [place] Do not drive yourself to the hospital. Summary An eardrum rupture is a hole (perforation) in the eardrum that can cause pain and hearing loss. It is usually caused by a sudden injury to the ear. The eardrum will likely heal on its own within a few weeks. In some cases, surgery may be necessary. Follow instructions from your health care provider about how to keep your ear dry as it heals. This information is not intended to replace advice given to you by your health care provider. Make sure you discuss any questions you have with your health care provider. Document Revised: 06/04/2020 Document Reviewed: 06/04/2020 Elsevier Patient Education  2022 [employer].

## 2021-05-21 ENCOUNTER — Other Ambulatory Visit: Payer: Self-pay

## 2021-05-21 ENCOUNTER — Other Ambulatory Visit: Payer: BC Managed Care – PPO

## 2021-05-21 DIAGNOSIS — E1169 Type 2 diabetes mellitus with other specified complication: Secondary | ICD-10-CM

## 2021-05-21 DIAGNOSIS — Z7901 Long term (current) use of anticoagulants: Secondary | ICD-10-CM

## 2021-05-22 LAB — LIPID PANEL
Chol/HDL Ratio: 2.3 ratio (ref 0.0–4.4)
Cholesterol, Total: 103 mg/dL (ref 100–199)
HDL: 44 mg/dL (ref >39)
LDL Chol Calc (NIH): 37 mg/dL (ref 0–99)
Triglycerides: 120 mg/dL (ref 0–149)
VLDL Cholesterol Cal: 22 mg/dL (ref 5–40)

## 2021-05-27 ENCOUNTER — Encounter: Payer: Self-pay | Admitting: Internal Medicine

## 2021-05-27 ENCOUNTER — Other Ambulatory Visit: Payer: Self-pay

## 2021-05-27 ENCOUNTER — Ambulatory Visit (INDEPENDENT_AMBULATORY_CARE_PROVIDER_SITE_OTHER): Payer: BC Managed Care – PPO | Admitting: Internal Medicine

## 2021-05-27 VITALS — BP 124/64 | HR 81 | Ht 63.0 in | Wt 164.0 lb

## 2021-05-27 DIAGNOSIS — I255 Ischemic cardiomyopathy: Secondary | ICD-10-CM

## 2021-05-27 DIAGNOSIS — Z951 Presence of aortocoronary bypass graft: Secondary | ICD-10-CM | POA: Diagnosis not present

## 2021-05-27 DIAGNOSIS — E7849 Other hyperlipidemia: Secondary | ICD-10-CM

## 2021-05-27 DIAGNOSIS — I447 Left bundle-branch block, unspecified: Secondary | ICD-10-CM

## 2021-05-27 DIAGNOSIS — Z952 Presence of prosthetic heart valve: Secondary | ICD-10-CM | POA: Diagnosis not present

## 2021-05-27 NOTE — Patient Instructions (Signed)
Medication Instructions:  Your physician recommends that you continue on your current medications as directed. Please refer to the Current Medication list given to you today.  *If you need a refill on your cardiac medications before your next appointment, please call your pharmacy*   Lab Work: FASTING lipid panel in 6 months (complete before your next appointment)  If you have labs (blood work) drawn today and your tests are completely normal, you will receive your results only by: [place] (if you have MyChart) OR A paper copy in the mail If you have any lab test that is abnormal or we need to change your treatment, we will call you to review the results.   Follow-Up: At [facility], you and your health needs are our priority.  As part of our continuing mission to provide you with exceptional heart care, we have created designated Provider Care Teams.  These Care Teams include your primary Cardiologist (physician) and Advanced Practice Providers (APPs -  Physician Assistants and Nurse Practitioners) who all work together to provide you with the care you need, when you need it.  We recommend signing up for the patient portal called "MyChart".  Sign up information is provided on this After Visit Summary.  MyChart is used to connect with patients for Virtual Visits (Telemedicine).  Patients are able to view lab/test results, encounter notes, upcoming appointments, etc.  Non-urgent messages can be sent to your provider as well.   To learn more about what you can do with MyChart, go to [email or web address].    Your next appointment:   6 month(s)  The format for your next appointment:   In Person  Provider:   You may see Pixie Casino, MD or one of the following Advanced Practice Providers on your designated Care Team:   Almyra Deforest, PA-C Fabian Sharp, PA-C or  Roby Lofts, [place]   Other Instructions

## 2021-05-27 NOTE — Progress Notes (Signed)
Chief Complaint:  Follow-up echo  Primary Care Physician: Lorrene Reid, PA-C  HPI:  Katrina Obrien is a 65 year old female who has a history of an unfortunate episode with left popliteal artery thrombus, PE, severe aortic stenosis which was ultimately diagnosed in a bicuspid aortic valve, as well as multivessel coronary disease, anterior MI, EF about 40% to 45% - all which came to a head about the same time. She underwent 3-vessel bypass as well as mechanical aortic valve replacement. She has done well on Coumadin and was ultimately diagnosed with diabetes and is followed by Dr. Chalmers Cater for this. Now she is markedly improved, although she has an ischemic cardiomyopathy with an EF of 40% to 45%. Her activity level is pretty good.  We recently performed repeat lipid testing, which demonstrated an elevated LDL particle number of 2163. The calculated LDL was 80. I asked her to increase her Crestor to 40 mg at night and add 500 mg Niaspan. She reports significant flushing and intolerance to the Niaspan, despite taking aspirin 30 minutes prior.  Her laboratory work does look improved, with her particle number being reduced to 1547, LDL 55, HDL of 39.  She does report however worsening pain in her capsule when she walks that works its way up her legs. This is nothing as significant as when she was originally diagnosed with her acute arterial thrombus, but has concerns for claudication. Alternatively, this could represent myalgias from her statin.  Katrina Obrien returns today for followup. She is doing extremely well. She continues to be active and has no complaints of chest pain worsening shortness of breath. She does feel like her legs tire easily however this could be improved with more exercise. She denies any symptoms of claudication. Her INR has been therapeutic. She reports good blood sugar control and has an appointment with her endocrinologist in the next couple of weeks. We recently obtained an  echocardiogram which showed normalization of her EF to 55-60% in March of 2015. She's also had lower extremity arterial Dopplers which show preserved ABIs.  I the pleasure seeing Katrina Obrien back in the office today. She reports doing fairly well except occasionally she gets somewhat "swimmy headed". This is worse with change of position. As previously noted her EF has improved back to 55-60%. Recently she's had persistently elevated lipoprotein particles including an LDL particle number in the 1900s. This is despite being on Zetia and Crestor 40 mg. Based on this I recommended that she start on Pralulent. She's been taking this now for just over a month and is having no problems with injection site reactions or side effects. We will plan to check her cholesterol again in about 2 months.  Katrina Obrien was seen in the office today in follow-up. Overall she seems to be doing very well. 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 She will be due for repeat cholesterol test after the first of the year. She denies any chest pain or worsening shortness of breath. She is complaining of some occasional swelling in her lower extremities which improves in the morning. She also gets some pain in her right leg. She is overdue for repeat lower extremity Dopplers and does have a history of course of arterial thrombus in the leg. Warfarin has been therapeutic and managed by the Coumadin clinic. Cholesterol is still not at goal despite max dose therapy. For brief period of time she was on samples of Repatha (PCSK9 inhibitor) with a marked response in cholesterol, however  insurance would not cover this.  12/10/2015  Katrina Obrien returns today for follow-up. She says this is the best she has felt some time. She denies any chest pain or worsening shortness of breath. Recently her cholesterol numbers have risen with LDL now 102. This would put her not at goal therapy. Given her multiple comorbidities. Recent data indicates the fact  that driving LDL below 50 may improve cardiovascular outcomes. This was with the use of maximal statin therapy as well as the addition of a PCSK9 inhibitor, specifically Repatha. In fact, she had responded very well to Pralulent in the past, with LDL as low as 20 however that was not covered by insurance. 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 Aggressive therapy is warranted.  07/15/2016  Katrina Obrien returns today for follow-up. Overall she feels very well. She had one episode of intense burning in her chest which happened a few weeks ago. It was worse when she was laying down at night. She did not take any medicine for that she was concerned she could take it with her other medicines. It sounds like reflux she's not had any more symptoms like that. I told her she could use Tums or an H2 blocker for that as needed. She recently went on to [place] and her LDL cholesterol is now 19. This should be a significant benefit in slowing her overall cardiovascular risks. As mentioned previously she had an echo this past year which showed normal LVEF 55-60% and a normally functioning mechanical aortic valve. Her INR was therapeutic today.  07/09/2017  Katrina Obrien was seen today in follow-up.  She seems to be doing very well.  She reported that she had to shovel snow for several hours with her son and at the end of that did develop some chest tightness/soreness which went away very quickly.  She said she felt like she overworked herself, but generally has had no symptoms.  Recently we repeated a lipid profile which showed marked improvement in her LDL to 28 on statin, ezetimibe and Repatha.  Her triglycerides were elevated at 349, however she mentioned today that this was nonfasting.  Also her blood sugar was elevated recently.  She was started on Jardiance, which I suspect has made a big difference in her blood sugars.  She says  her hemoglobin A1c now is down to 6.1.  Accordingly, her triglycerides have probably improved as well.  Fortunately, her husband was not with her today.  Apparently he is suffering from depression and has had several bouts.  He is not interested in leaving the house and is not as engaged in activities as he has been in the past.  This is obviously causing her a lot of stress and is not clear to her how she can help him.  03/19/2018  Katrina Obrien returns today for follow-up.  She says she is felt today the best she has in years.  Unfortunately, she had a recent colonoscopy which turned out okay however having to come off all of her medications was challenging for her and she was not a fan of bridging Lovenox.  She does report her blood sugars been well controlled.  Her cholesterol is at goal with recent LDL of 58 however it is actually been lower with Repatha.  Her triglycerides are also well controlled at 110.  Her Saint Jude mechanical valve is functioning properly by echo in 2017 which showed normal LVEF.  She  denies any chest pain symptoms with history of three-vessel CABG in 2012.  Blood pressure is at goal today as well.  EKG shows a stable left bundle branch block.  09/16/2018  Katrina Obrien is seen today in follow-up.  She is excited at the fact that she will be having a new grandbaby fairly soon.  Overall she is doing well without new symptoms.  She denies any chest pain or worsening shortness of breath.  Her INR was repeated today was 3.5.  No adjustments were made to her warfarin.  Her mechanical valve is working properly and was last assessed by echo last year.  She has lost some additional weight.  Her blood pressure was elevated little today however she has been under some stress and very recently took her medications.   09/28/2019  Katrina Obrien returns today for follow-up.  Overall she is doing well.  She said she has had both coronavirus vaccines.  She is back teaching in the classroom.  Unfortunately she has  been struggling with some weight gain.  This was due to stress and other issues with her father-in-law who ultimately died and also having to care for her husband.  Her triglycerides were elevated over the summer but LDL remained at goal.  Her A1c was 6.9 likely indicating that diabetes was a factor.  She is now working with a dietitian, exercising more regularly and eating better.  INR was 2.3 today which is slightly below her target of 2.5-3.5.  03/27/2020  Katrina Obrien returns today for follow-up.  Overall she continues to feel well.  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  She denies any chest pain or worsening shortness of breath.  She denies any symptoms of claudication.  Labs have been fairly stable.  Recent LDL remains below 70.  Triglycerides were a little elevated.  She is on Praluent and 40 mg of rosuvastatin.  Hemoglobin A1c was 8.1.  She is on Jardiance and Metformin.  She may need additional treatments if she is not able to reduce that further with diet.  The last echo showed stable valve gradients in December 2019.  10/04/2020  Katrina Obrien is seen today in follow-up.  She underwent a recent echocardiogram which surprisingly showed some decline in LVEF to 45 to 50%.  Is been a small increase in her aortic valve gradient to 16 mmHg.  The aorta is mildly dilated at 39 mm.  She reports that she has had a little bit more fatigue.  She gets short of breath but only with marked exertion.  She does have a left bundle branch block which has been chronic, however I wonder whether this might be contributing to her cardiomyopathy.  She also had COVID-19 despite being vaccinated and boosted just a couple months ago.  She feels like she is recovered from that however I wonder if that could have played a role in this cardiomyopathy as well.  She does have coronary artery disease with prior grafts and she could have some degree of ischemia.  She says she does  get some dyspnea and fatigue but no real anginal symptoms   10/23/2020  Katrina Obrien returns today for follow-up of her Myoview stress test.  I had decreased her Crestor because her LDL was actually quite low.  Surprisingly she reports some improvement in her fatigue.  Perhaps she was having some side effects from the statin.  Showed a possible small area  of anteroseptal ischemia but might be related to her left bundle branch block.  It was calculated as an EF of 28% but actually was more like 45 to 50%.  This is more consistent with her echo findings.  I do think there is some mild cardiomyopathy.  She denies any angina but does have still some fatigue and shortness of breath.  After some discussion today I felt less inclined to pursue cardiac catheterization given the small defect and the fact that she had some improvement in her symptoms off of the Crestor.  I advised actually that we take a 2-week statin holiday off the Crestor but she should continue her PCSK9 inhibitor.  If her symptoms are markedly improved then we will need to consider another option to reach her cholesterol targets.  Also I would advise we switch her ramipril to [facility] but start that after her statin holiday as not to confuse any potential side effects.  01/30/2021  Katrina Obrien is seen today in follow-up.  Unfortunately she has been under a lot of stress.  Her brother just died.  He also probably had FH with bypass surgery not too long after she had it and stents as well.  Apparently had sudden cardiac death and was found about 2 days later after he did not show up to work.  She has been struggling with this.  In addition she had stopped her Crestor because of potential side effects.  She did say that she felt a little better off the Crestor.  That being said her cholesterol has rebounded quite high.  Her total is now 268, HDL 47, LDL 158 and triglycerides 338.  She remains on Praluent.  She seems to be tolerating Entresto which we had  switched her to from an ACE inhibitor.  This is for LVEF 45 to 50%, suspect due to her left bundle branch block with a QRS D of 154 ms.  05/27/2021  Ms. Obrien returns today for follow-up.  She underwent a repeat echocardiogram which shows stable LVEF of 45 to 50%.  After switching her statins, she seems to be tolerating the atorvastatin better.  Lipids are now much improved with total cholesterol 103, HDL 44, triglycerides 120 and LDL 37.  She is also on Praluent.  EKG shows sinus rhythm with PVCs and a left bundle branch block with QRSD of 148 ms.  She denies any worsening shortness of breath.  Level of fatigue is unchanged.  PMHx:  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   Clotting disorder (HCC)    anticoagulant - coumadin hx DVT, PE   Coronary artery disease    s/p 3- vessel bypass    Diabetes mellitus without complication (HCC)    DVT (deep venous thrombosis) (HCC)    Heart murmur    Hyperlipidemia    Hypertension    Ischemic cardiomyopathy    40 to 45%   Left bundle branch block    Mechanical heart valve present    Jan 2012   Popliteal artery occlusion, left (HCC)    recent  status  post angioplasty   Pulmonary embolism ([place])    associated with DVT   Pulmonary embolus ([place])    Thyroid disease    hyperthyroidism    Past Surgical History:  Procedure Laterality Date   AORTIC VALVE REPLACEMENT (AVR)/CORONARY ARTERY BYPASS GRAFTING (CABG)  08/16/2010  VAN TRIGT   AV replacement w/19-mm mechanical St Jude valve ([id number]),CABGX3  lima to LAD, SVG to distal circ,SVG to posterior descending   ASD REPAIR     CARDIAC CATHETERIZATION  08/08/2010   ,LAD 50% PROX,605 TANDEM SEGMENTAL STENOSIS,LEFT CIRC 60%-AV GROOVE, RGT COR 40% PROX ON THE BEND ,80% PROX PDA,   CORONARY ARTERY BYPASS GRAFT  08/16/2010   LIMA TO lad,svg to distal circ ,svg to posterior  descending   DOPPLER ECHOCARDIOGRAPHY  08/09/2010   EF35% to 40% ,bbicuspid ;severely  thickened ,severely calcified leaflets, LV normal   LEA doppler  08/07/2010   left popliteal occlusion ,and anterior tibial complete occlusion   NM MYOCAR PERF WALL MOTION  08/06/2010   EF 31%  LOW RISK SCAN   nuc     PV ANGIOGRAM  08/08/2010   successful PTA LEFT POPLITEAL    FAMHx:  Family History  Problem Relation Age of Onset   Cancer Mother    Cancer Father    CAD Father    Heart attack Father 3       first MI, 2 major after that, multiple silent MIs   Stroke Brother    CAD Brother 48       first stent   Heart attack Brother    CAD Brother 48       CABG    Hypertension Brother    Heart attack Brother 33       died from MI   Colon cancer Neg Hx    Esophageal cancer Neg Hx    Rectal cancer Neg Hx    Stomach cancer Neg Hx     SOCHx:   reports that she has never smoked. She has never used smokeless tobacco. She reports current alcohol use of about 7.0 standard drinks per week. She reports that she does not use drugs.  ALLERGIES:  Allergies  Allergen Reactions   Niaspan [Niacin Er] Other (See Comments)    Flushing, even with ASA   Rosuvastatin Other (See Comments)    Fatigue, weakness    ROS: Pertinent items noted in HPI and remainder of comprehensive ROS otherwise negative.  HOME MEDS: Current Outpatient Medications  Medication Sig Dispense Refill   amoxicillin-clavulanate (AUGMENTIN) 875-125 MG tablet Take 1 tablet by mouth 2 (two) times daily. 20 tablet 0   aspirin EC 81 MG tablet Take 81 mg by mouth daily.     benzonatate (TESSALON) 100 MG capsule Take 2 capsules (200 mg total) by mouth 3 (three) times daily as needed for cough. 30 capsule 0   ciclopirox (LOPROX) 0.77 % SUSP Apply topically at bedtime. Remove with alcohol every 7 days. 30 mL 1   furosemide (LASIX) 40 MG tablet TAKE 1/2 TABLET BY MOUTH DAILY. 45 tablet 2   JARDIANCE 25 MG TABS tablet TAKE 1 TABLET BY  MOUTH EVERY DAY 90 tablet 0   levothyroxine (SYNTHROID) 50 MCG tablet TAKE 1 TABLET BY MOUTH DAILY BEFORE BREAKFAST. 90 tablet 0   metFORMIN (GLUCOPHAGE-XR) 500 MG 24 hr tablet TAKE 1 TABLET BY MOUTH IN THE MORNING AND 2 TABLETS AT NIGHT. 270 tablet 0   metoprolol succinate (TOPROL-XL) 100 MG 24 hr tablet TAKE 1 TABLET BY MOUTH DAILY. TAKE WITH OR IMMEDIATELY FOLLOWING A MEAL. 90 tablet 2   PRALUENT 150 MG/ML SOAJ INJECT 1 DOSE INTO THE SKIN EVERY 14 DAYS 150 mL 3   sacubitril-valsartan (ENTRESTO) 24-26 MG Take 1 tablet by mouth 2 (two) times daily. 180 tablet 3  Semaglutide,0.25 or 0.5MG /DOS, (OZEMPIC, 0.25 OR 0.5 MG/DOSE,) 2 MG/1.5ML SOPN INJECT 0.25 MG SUBCUTANEOUSLY ONCE WEEKLY FOR 4 WEEKS, THEN 0.5 MG ONCE WEEKLY. 1.5 mL 3   VITAMIN D PO Take by mouth.     warfarin (COUMADIN) 2 MG tablet TAKE 1 TO 1 AND 1/2 TABLETS BY MOUTH DAILY AS DIRECTED BY COUMADIN CLINIC. 120 tablet 1   atorvastatin (LIPITOR) 40 MG tablet Take 1 tablet (40 mg total) by mouth daily. 90 tablet 3   No current facility-administered medications for this visit.    LABS/IMAGING: No results found for this or any previous visit (from the past 48 hour(s)).  No results found.  VITALS: BP 124/64 (BP Location: Left Arm, Patient Position: Sitting, Cuff Size: Normal)   Pulse 81   Ht 5\' 3"  (1.6 m)   Wt 164 lb (74.4 kg)   LMP  (LMP Unknown)   BMI 29.05 kg/m   EXAM: General appearance: alert and no distress Neck: no carotid bruit, no JVD, and thyroid not enlarged, symmetric, no tenderness/mass/nodules Lungs: clear to auscultation bilaterally Heart: regular rate and rhythm, S1, S2 normal, and systolic murmur: early systolic 2/6, crescendo at 2nd right intercostal space Abdomen: soft, non-tender; bowel sounds normal; no masses,  no organomegaly Extremities: extremities normal, atraumatic, no cyanosis or edema Pulses: 2+ and symmetric Skin: Skin color, texture, turgor normal. No rashes or lesions Neurologic: Grossly  normal Psych: Pleasant  EKG: Normal sinus rhythm at 81 with PVC, LBBB, QRSD 148 ms-personally reviewed  ASSESSMENT: Recent fatigue/dyspnea-LVEF 45 to 50% (09/2020) Coronary artery disease status post 3 vessel CABG (LIMA to LAD, SVG to circumflex, SVG to PDA) - 07/2010 Severe aortic stenosis and a bicuspid aortic valve, status post 19 mm St. Jude mechanical aortic valve excellent recent left popliteal artery occlusion with rest pain status post angioplasty (2012) History of pulmonary emboli associated with DVT Ischemic cardiomyopathy, EF now normalized at 55-60% Hypertension - at goal Familial hyperlipidemia - LDL>190 on Repatha Diabetes type 2 - controlled LBBB   PLAN: 1.   Katrina Obrien has had stable LVEF 45 to 50% with repeat echo despite adjustments in her medications.  This could be related to left bundle branch block as her QRS D is around 150 ms.  Blood pressure is well controlled today.  She denies any worsening shortness of breath and at most is NYHA class II symptoms.  Her cholesterol is now well controlled again.  She seems to be tolerating the atorvastatin much better than she did with rosuvastatin.  She also is on Praluent.  LDL at 37.  She meets new targets for very high risk individuals.  Plan follow-up with me in 6 months or sooner as necessary  Pixie Casino, MD, FACC, [place] Director of the [facility] Attending Cardiologist   Direct Dial: [phone]  Fax: [phone]  Website:  [email or web address] Bettyjean Stefanski 05/27/2021, 11:33 AM

## 2021-06-07 ENCOUNTER — Other Ambulatory Visit: Payer: Self-pay | Admitting: Physician Assistant

## 2021-06-15 ENCOUNTER — Other Ambulatory Visit: Payer: Self-pay | Admitting: Physician Assistant

## 2021-06-24 ENCOUNTER — Ambulatory Visit (INDEPENDENT_AMBULATORY_CARE_PROVIDER_SITE_OTHER): Payer: BC Managed Care – PPO

## 2021-06-24 ENCOUNTER — Other Ambulatory Visit: Payer: Self-pay

## 2021-06-24 DIAGNOSIS — Z7901 Long term (current) use of anticoagulants: Secondary | ICD-10-CM

## 2021-06-24 DIAGNOSIS — Z952 Presence of prosthetic heart valve: Secondary | ICD-10-CM | POA: Diagnosis not present

## 2021-06-24 LAB — POCT INR: INR: 2.6 (ref 2.0–3.0)

## 2021-06-24 NOTE — Patient Instructions (Signed)
Continue taking 1 tablet daily except 1.5 tablets each Monday, Wednesday and Friday. Repeat INR in 8 weeks. Coumadin Clinic [phone] 

## 2021-07-10 ENCOUNTER — Encounter: Payer: Self-pay | Admitting: Physician Assistant

## 2021-07-10 ENCOUNTER — Ambulatory Visit (INDEPENDENT_AMBULATORY_CARE_PROVIDER_SITE_OTHER): Payer: BC Managed Care – PPO | Admitting: Physician Assistant

## 2021-07-10 ENCOUNTER — Other Ambulatory Visit: Payer: Self-pay

## 2021-07-10 VITALS — BP 139/67 | HR 68 | Temp 97.4°F | Ht 63.0 in | Wt 165.0 lb

## 2021-07-10 DIAGNOSIS — E785 Hyperlipidemia, unspecified: Secondary | ICD-10-CM

## 2021-07-10 DIAGNOSIS — E1169 Type 2 diabetes mellitus with other specified complication: Secondary | ICD-10-CM | POA: Diagnosis not present

## 2021-07-10 DIAGNOSIS — E119 Type 2 diabetes mellitus without complications: Secondary | ICD-10-CM

## 2021-07-10 DIAGNOSIS — E1159 Type 2 diabetes mellitus with other circulatory complications: Secondary | ICD-10-CM | POA: Diagnosis not present

## 2021-07-10 DIAGNOSIS — I152 Hypertension secondary to endocrine disorders: Secondary | ICD-10-CM | POA: Diagnosis not present

## 2021-07-10 LAB — POCT GLYCOSYLATED HEMOGLOBIN (HGB A1C): Hemoglobin A1C: 7.3 % — AB (ref 4.0–5.6)

## 2021-07-10 NOTE — Progress Notes (Signed)
Established Patient Office Visit  Subjective:  Patient ID: Katrina Obrien, female    DOB: May 26, 1956  Age: 65 y.o. MRN: [id number]  CC:  Chief Complaint  Patient presents with   Follow-up   Diabetes   Hypertension    HPI Katrina Obrien presents for follow up on diabetes mellitus, hypertension and hyperlipidemia.  Diabetes: Pt denies increased urination or thirst. Pt reports medication compliance. No hypoglycemic events. Patient has not been as diligent with monitoring her carbohydrate and glucose diet. Has been eating more sweets.   HTN: Pt denies chest pain, palpitations, dizziness or lower extremity swelling. Taking medication as directed without side effects.   HLD: Pt taking medication as directed without issues. Tolerating atorvastatin better than rosuvastatin. Also on Repatha. States eating more foods she typically doesn't.   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   Clotting disorder (HCC)    anticoagulant - coumadin hx DVT, PE   Coronary artery disease    s/p 3- vessel bypass    Diabetes mellitus without complication (HCC)    DVT (deep venous thrombosis) (HCC)    Heart murmur    Hyperlipidemia    Hypertension    Ischemic cardiomyopathy    40 to 45%   Left bundle branch block    Mechanical heart valve present    Jan 2012   Popliteal artery occlusion, left (HCC)    recent  status  post angioplasty   Pulmonary embolism (HCC)    associated with DVT   Pulmonary embolus ([place])    Thyroid disease    hyperthyroidism    Past Surgical History:  Procedure Laterality Date   AORTIC VALVE REPLACEMENT (AVR)/CORONARY ARTERY BYPASS GRAFTING (CABG)  08/16/2010  VAN TRIGT   AV replacement w/19-mm mechanical St Jude valve ([id number]),CABGX3  lima to LAD, SVG to distal circ,SVG to posterior descending   ASD REPAIR     CARDIAC CATHETERIZATION   08/08/2010   ,LAD 50% PROX,605 TANDEM SEGMENTAL STENOSIS,LEFT CIRC 60%-AV GROOVE, RGT COR 40% PROX ON THE BEND ,80% PROX PDA,   CORONARY ARTERY BYPASS GRAFT  08/16/2010   LIMA TO lad,svg to distal circ ,svg to posterior descending   DOPPLER ECHOCARDIOGRAPHY  08/09/2010   EF35% to 40% ,bbicuspid ;severely  thickened ,severely calcified leaflets, LV normal   LEA doppler  08/07/2010   left popliteal occlusion ,and anterior tibial complete occlusion   NM MYOCAR PERF WALL MOTION  08/06/2010   EF 31%  LOW RISK SCAN   nuc     PV ANGIOGRAM  08/08/2010   successful PTA LEFT POPLITEAL    Family History  Problem Relation Age of Onset   Cancer Mother    Cancer Father    CAD Father    Heart attack Father 63       first MI, 2 major after that, multiple silent MIs   Stroke Brother    CAD Brother 48       first stent   Heart attack Brother    CAD Brother 48       CABG    Hypertension Brother    Heart attack Brother 39       died from MI   Colon cancer Neg Hx    Esophageal cancer Neg Hx    Rectal cancer Neg Hx    Stomach cancer Neg Hx     Social  Socioeconomic History   Marital status: Married    Spouse name: Not on file   Number of children: Not on file   Years of education: Not on file   Highest education level: Not on file  Occupational History   Not on file  Tobacco Use   Smoking status: Never   Smokeless tobacco: Never  Vaping Use   Vaping Use: Never used  Substance and Sexual Activity   Alcohol use: Yes    Alcohol/week: 7.0 standard drinks    Types: 7 Standard drinks or equivalent per week   Drug use: No   Sexual activity: Not Currently  Other Topics Concern   Not on file  Social History Narrative   Not on file   Social Determinants of Health   Financial Resource Strain: Not on file  Food Insecurity: Not on file  Transportation Needs: Not on file  Physical Activity: Not on file  Stress: Not on file  Social Connections: Not on file  Intimate Partner  Violence: Not on file    Outpatient Medications Prior to Visit  Medication Sig Dispense Refill   aspirin EC 81 MG tablet Take 81 mg by mouth daily.     ciclopirox (LOPROX) 0.77 % SUSP Apply topically at bedtime. Remove with alcohol every 7 days. 30 mL 1   furosemide (LASIX) 40 MG tablet TAKE 1/2 TABLET BY MOUTH DAILY. 45 tablet 2   JARDIANCE 25 MG TABS tablet TAKE 1 TABLET BY MOUTH EVERY DAY 90 tablet 1   levothyroxine (SYNTHROID) 50 MCG tablet TAKE 1 TABLET BY MOUTH DAILY BEFORE BREAKFAST. 90 tablet 1   metFORMIN (GLUCOPHAGE-XR) 500 MG 24 hr tablet TAKE 1 TABLET BY MOUTH IN THE MORNING AND 2 TABLETS AT NIGHT. 270 tablet 0   metoprolol succinate (TOPROL-XL) 100 MG 24 hr tablet TAKE 1 TABLET BY MOUTH DAILY. TAKE WITH OR IMMEDIATELY FOLLOWING A MEAL. 90 tablet 2   PRALUENT 150 MG/ML SOAJ INJECT 1 DOSE INTO THE SKIN EVERY 14 DAYS 150 mL 3   sacubitril-valsartan (ENTRESTO) 24-26 MG Take 1 tablet by mouth 2 (two) times daily. 180 tablet 3   Semaglutide,0.25 or 0.5MG /DOS, (OZEMPIC, 0.25 OR 0.5 MG/DOSE,) 2 MG/1.5ML SOPN INJECT 0.25 MG SUBCUTANEOUSLY ONCE WEEKLY FOR 4 WEEKS, THEN 0.5 MG ONCE WEEKLY. 1.5 mL 3   VITAMIN D PO Take by mouth.     warfarin (COUMADIN) 2 MG tablet TAKE 1 TO 1 AND 1/2 TABLETS BY MOUTH DAILY AS DIRECTED BY COUMADIN CLINIC. 120 tablet 1   atorvastatin (LIPITOR) 40 MG tablet Take 1 tablet (40 mg total) by mouth daily. 90 tablet 3   amoxicillin-clavulanate (AUGMENTIN) 875-125 MG tablet Take 1 tablet by mouth 2 (two) times daily. 20 tablet 0   benzonatate (TESSALON) 100 MG capsule Take 2 capsules (200 mg total) by mouth 3 (three) times daily as needed for cough. 30 capsule 0   No facility-administered medications prior to visit.    Allergies  Allergen Reactions   Niaspan [Niacin Er] Other (See Comments)    Flushing, even with ASA   Rosuvastatin Other (See Comments)    Fatigue, weakness    ROS Review of Systems Review of Systems:  A fourteen system review of systems was  performed and found to be positive as per HPI.   Objective:    Physical Exam General:  Well Developed, well nourished, appropriate for stated age.  Neuro:  Alert and oriented,  extra-ocular muscles intact  HEENT:  Normocephalic, atraumatic, neck supple, no carotid bruits appreciated  Established Patient Office Visit  Subjective:  Patient ID: Katrina Obrien, female    DOB: May 26, 1956  Age: 65 y.o. MRN: [id number]  CC:  Chief Complaint  Patient presents with   Follow-up   Diabetes   Hypertension    HPI Katrina Obrien presents for follow up on diabetes mellitus, hypertension and hyperlipidemia.  Diabetes: Pt denies increased urination or thirst. Pt reports medication compliance. No hypoglycemic events. Patient has not been as diligent with monitoring her carbohydrate and glucose diet. Has been eating more sweets.   HTN: Pt denies chest pain, palpitations, dizziness or lower extremity swelling. Taking medication as directed without side effects.   HLD: Pt taking medication as directed without issues. Tolerating atorvastatin better than rosuvastatin. Also on Repatha. States eating more foods she typically doesn't.   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   Clotting disorder (HCC)    anticoagulant - coumadin hx DVT, PE   Coronary artery disease    s/p 3- vessel bypass    Diabetes mellitus without complication (HCC)    DVT (deep venous thrombosis) (HCC)    Heart murmur    Hyperlipidemia    Hypertension    Ischemic cardiomyopathy    40 to 45%   Left bundle branch block    Mechanical heart valve present    Jan 2012   Popliteal artery occlusion, left (HCC)    recent  status  post angioplasty   Pulmonary embolism (HCC)    associated with DVT   Pulmonary embolus ([place])    Thyroid disease    hyperthyroidism    Past Surgical History:  Procedure Laterality Date   AORTIC VALVE REPLACEMENT (AVR)/CORONARY ARTERY BYPASS GRAFTING (CABG)  08/16/2010  VAN TRIGT   AV replacement w/19-mm mechanical St Jude valve ([id number]),CABGX3  lima to LAD, SVG to distal circ,SVG to posterior descending   ASD REPAIR     CARDIAC CATHETERIZATION   08/08/2010   ,LAD 50% PROX,605 TANDEM SEGMENTAL STENOSIS,LEFT CIRC 60%-AV GROOVE, RGT COR 40% PROX ON THE BEND ,80% PROX PDA,   CORONARY ARTERY BYPASS GRAFT  08/16/2010   LIMA TO lad,svg to distal circ ,svg to posterior descending   DOPPLER ECHOCARDIOGRAPHY  08/09/2010   EF35% to 40% ,bbicuspid ;severely  thickened ,severely calcified leaflets, LV normal   LEA doppler  08/07/2010   left popliteal occlusion ,and anterior tibial complete occlusion   NM MYOCAR PERF WALL MOTION  08/06/2010   EF 31%  LOW RISK SCAN   nuc     PV ANGIOGRAM  08/08/2010   successful PTA LEFT POPLITEAL    Family History  Problem Relation Age of Onset   Cancer Mother    Cancer Father    CAD Father    Heart attack Father 63       first MI, 2 major after that, multiple silent MIs   Stroke Brother    CAD Brother 48       first stent   Heart attack Brother    CAD Brother 48       CABG    Hypertension Brother    Heart attack Brother 39       died from MI   Colon cancer Neg Hx    Esophageal cancer Neg Hx    Rectal cancer Neg Hx    Stomach cancer Neg Hx     Social  Established Patient Office Visit  Subjective:  Patient ID: Katrina Obrien, female    DOB: May 26, 1956  Age: 65 y.o. MRN: [id number]  CC:  Chief Complaint  Patient presents with   Follow-up   Diabetes   Hypertension    HPI Katrina Obrien presents for follow up on diabetes mellitus, hypertension and hyperlipidemia.  Diabetes: Pt denies increased urination or thirst. Pt reports medication compliance. No hypoglycemic events. Patient has not been as diligent with monitoring her carbohydrate and glucose diet. Has been eating more sweets.   HTN: Pt denies chest pain, palpitations, dizziness or lower extremity swelling. Taking medication as directed without side effects.   HLD: Pt taking medication as directed without issues. Tolerating atorvastatin better than rosuvastatin. Also on Repatha. States eating more foods she typically doesn't.   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   Clotting disorder (HCC)    anticoagulant - coumadin hx DVT, PE   Coronary artery disease    s/p 3- vessel bypass    Diabetes mellitus without complication (HCC)    DVT (deep venous thrombosis) (HCC)    Heart murmur    Hyperlipidemia    Hypertension    Ischemic cardiomyopathy    40 to 45%   Left bundle branch block    Mechanical heart valve present    Jan 2012   Popliteal artery occlusion, left (HCC)    recent  status  post angioplasty   Pulmonary embolism (HCC)    associated with DVT   Pulmonary embolus ([place])    Thyroid disease    hyperthyroidism    Past Surgical History:  Procedure Laterality Date   AORTIC VALVE REPLACEMENT (AVR)/CORONARY ARTERY BYPASS GRAFTING (CABG)  08/16/2010  VAN TRIGT   AV replacement w/19-mm mechanical St Jude valve ([id number]),CABGX3  lima to LAD, SVG to distal circ,SVG to posterior descending   ASD REPAIR     CARDIAC CATHETERIZATION   08/08/2010   ,LAD 50% PROX,605 TANDEM SEGMENTAL STENOSIS,LEFT CIRC 60%-AV GROOVE, RGT COR 40% PROX ON THE BEND ,80% PROX PDA,   CORONARY ARTERY BYPASS GRAFT  08/16/2010   LIMA TO lad,svg to distal circ ,svg to posterior descending   DOPPLER ECHOCARDIOGRAPHY  08/09/2010   EF35% to 40% ,bbicuspid ;severely  thickened ,severely calcified leaflets, LV normal   LEA doppler  08/07/2010   left popliteal occlusion ,and anterior tibial complete occlusion   NM MYOCAR PERF WALL MOTION  08/06/2010   EF 31%  LOW RISK SCAN   nuc     PV ANGIOGRAM  08/08/2010   successful PTA LEFT POPLITEAL    Family History  Problem Relation Age of Onset   Cancer Mother    Cancer Father    CAD Father    Heart attack Father 63       first MI, 2 major after that, multiple silent MIs   Stroke Brother    CAD Brother 48       first stent   Heart attack Brother    CAD Brother 48       CABG    Hypertension Brother    Heart attack Brother 39       died from MI   Colon cancer Neg Hx    Esophageal cancer Neg Hx    Rectal cancer Neg Hx    Stomach cancer Neg Hx     Social

## 2021-07-10 NOTE — Assessment & Plan Note (Signed)
-  Stable. -Continue current medication regimen. -Will collect CMP with CPE. -Will continue to monitor.

## 2021-07-10 NOTE — Assessment & Plan Note (Signed)
-  Followed by cardiology. -Last lipid panel, LDL 37 (at goal). -Continue current medication regimen. -Will continue to monitor.

## 2021-07-10 NOTE — Assessment & Plan Note (Signed)
-  A1c mildly increased from 6.9 to 7.3 likely secondary to poor adherence to low carbohydrate and glucose diet. Will continue current medication regimen and patient will work on improving diet. -Scheduled for eye exam this Friday. -Foot exam performed, wnl's. -Will continue to monitor.

## 2021-07-10 NOTE — Patient Instructions (Signed)

## 2021-07-10 NOTE — Assessment & Plan Note (Signed)
>>  ASSESSMENT AND PLAN FOR CONTROLLED TYPE 2 DIABETES MELLITUS WITHOUT COMPLICATION (HCC) WRITTEN ON 07/10/2021  2:43 PM BY ABONZA, MARITZA, PA-C  -A1c mildly increased from 6.9 to 7.3 likely secondary to poor adherence to low carbohydrate and glucose diet. Will continue current medication regimen and patient will work on improving diet. -Scheduled for eye exam this Friday. -Foot exam performed, wnl's. -Will continue to monitor.

## 2021-07-12 LAB — HM DIABETES EYE EXAM

## 2021-07-15 ENCOUNTER — Encounter: Payer: Self-pay | Admitting: Physician Assistant

## 2021-07-17 ENCOUNTER — Other Ambulatory Visit: Payer: Self-pay

## 2021-07-17 DIAGNOSIS — E119 Type 2 diabetes mellitus without complications: Secondary | ICD-10-CM

## 2021-07-17 DIAGNOSIS — I152 Hypertension secondary to endocrine disorders: Secondary | ICD-10-CM

## 2021-07-23 ENCOUNTER — Other Ambulatory Visit: Payer: Self-pay

## 2021-07-23 ENCOUNTER — Other Ambulatory Visit: Payer: BC Managed Care – PPO

## 2021-07-23 ENCOUNTER — Other Ambulatory Visit: Payer: Self-pay | Admitting: Physician Assistant

## 2021-07-23 ENCOUNTER — Other Ambulatory Visit: Payer: Self-pay | Admitting: Internal Medicine

## 2021-07-23 DIAGNOSIS — E119 Type 2 diabetes mellitus without complications: Secondary | ICD-10-CM

## 2021-07-23 DIAGNOSIS — E1169 Type 2 diabetes mellitus with other specified complication: Secondary | ICD-10-CM

## 2021-07-23 DIAGNOSIS — I152 Hypertension secondary to endocrine disorders: Secondary | ICD-10-CM

## 2021-07-24 LAB — CBC WITH DIFFERENTIAL/PLATELET
Basophils Absolute: 0 10*3/uL (ref 0.0–0.2)
Basos: 1 %
EOS (ABSOLUTE): 0.1 10*3/uL (ref 0.0–0.4)
Eos: 2 %
Hematocrit: 41.1 % (ref 34.0–46.6)
Hemoglobin: 13.6 g/dL (ref 11.1–15.9)
Immature Grans (Abs): 0 10*3/uL (ref 0.0–0.1)
Immature Granulocytes: 0 %
Lymphocytes Absolute: 3 10*3/uL (ref 0.7–3.1)
Lymphs: 46 %
MCH: 30.8 pg (ref 26.6–33.0)
MCHC: 33.1 g/dL (ref 31.5–35.7)
MCV: 93 fL (ref 79–97)
Monocytes Absolute: 0.4 10*3/uL (ref 0.1–0.9)
Monocytes: 6 %
Neutrophils Absolute: 2.9 10*3/uL (ref 1.4–7.0)
Neutrophils: 45 %
Platelets: 267 10*3/uL (ref 150–450)
RBC: 4.41 x10E6/uL (ref 3.77–5.28)
RDW: 12.9 % (ref 11.7–15.4)
WBC: 6.5 10*3/uL (ref 3.4–10.8)

## 2021-07-24 LAB — COMPREHENSIVE METABOLIC PANEL
ALT: 42 IU/L — ABNORMAL HIGH (ref 0–32)
AST: 29 IU/L (ref 0–40)
Albumin/Globulin Ratio: 2 (ref 1.2–2.2)
Albumin: 4.4 g/dL (ref 3.8–4.8)
Alkaline Phosphatase: 62 IU/L (ref 44–121)
BUN/Creatinine Ratio: 33 — ABNORMAL HIGH (ref 12–28)
BUN: 23 mg/dL (ref 8–27)
Bilirubin Total: 0.4 mg/dL (ref 0.0–1.2)
CO2: 22 mmol/L (ref 20–29)
Calcium: 9.2 mg/dL (ref 8.7–10.3)
Chloride: 107 mmol/L — ABNORMAL HIGH (ref 96–106)
Creatinine, Ser: 0.7 mg/dL (ref 0.57–1.00)
Globulin, Total: 2.2 g/dL (ref 1.5–4.5)
Glucose: 140 mg/dL — ABNORMAL HIGH (ref 70–99)
Potassium: 4.4 mmol/L (ref 3.5–5.2)
Sodium: 146 mmol/L — ABNORMAL HIGH (ref 134–144)
Total Protein: 6.6 g/dL (ref 6.0–8.5)
eGFR: 97 mL/min/{1.73_m2} (ref >59)

## 2021-07-24 LAB — HEMOGLOBIN A1C
Est. average glucose Bld gHb Est-mCnc: 171 mg/dL
Hgb A1c MFr Bld: 7.6 % — ABNORMAL HIGH (ref 4.8–5.6)

## 2021-08-19 ENCOUNTER — Other Ambulatory Visit: Payer: Self-pay

## 2021-08-19 ENCOUNTER — Ambulatory Visit (INDEPENDENT_AMBULATORY_CARE_PROVIDER_SITE_OTHER): Payer: BC Managed Care – PPO

## 2021-08-19 DIAGNOSIS — Z952 Presence of prosthetic heart valve: Secondary | ICD-10-CM | POA: Diagnosis not present

## 2021-08-19 DIAGNOSIS — Z7901 Long term (current) use of anticoagulants: Secondary | ICD-10-CM

## 2021-08-19 LAB — POCT INR: INR: 2.6 (ref 2.0–3.0)

## 2021-08-19 NOTE — Patient Instructions (Signed)
Continue taking 1 tablet daily except 1.5 tablets each Monday, Wednesday and Friday. Repeat INR in 8 weeks. Coumadin Clinic [phone] 

## 2021-08-23 ENCOUNTER — Other Ambulatory Visit: Payer: Self-pay | Admitting: Physician Assistant

## 2021-08-23 DIAGNOSIS — E1165 Type 2 diabetes mellitus with hyperglycemia: Secondary | ICD-10-CM

## 2021-09-05 ENCOUNTER — Telehealth: Payer: Self-pay | Admitting: Internal Medicine

## 2021-09-05 NOTE — Telephone Encounter (Signed)
PA for Praluent approved 09/02/2021 -- 09/02/2022

## 2021-10-09 NOTE — Progress Notes (Signed)
? ?Complete physical exam ? ? ?Patient: Katrina Obrien   DOB: 10/30/1955   66 y.o. Female  MRN: [id number] ?Visit Date: 10/10/2021 ? ? ?Chief Complaint  ?Patient presents with  ? Annual Exam  ? ?Subjective  ?  ?Katrina Obrien is a 66 y.o. female who presents today for a complete physical exam.  ?She reports consuming a low fat and diabetic  diet. The patient does not participate in regular exercise at present. She generally feels fairly well. She does not have additional problems to discuss today.  ? ? ? ?Past Medical History:  ?Diagnosis Date  ? Aortic valve stenosis, severe   ? and bbicusipid aortic valve status post 19-mm St Jude mechanical aortic valve replacement  ? Clotting disorder (HCC)   ? anticoagulant - coumadin hx DVT, PE  ? Coronary artery disease   ? s/p 3- vessel bypass   ? Diabetes mellitus without complication (HCC)   ? DVT (deep venous thrombosis) (HCC)   ? Heart murmur   ? Hyperlipidemia   ? Hypertension   ? Ischemic cardiomyopathy   ? 40 to 45%  ? Left bundle branch block   ? Mechanical heart valve present   ? Jan 2012  ? Popliteal artery occlusion, left (HCC)   ? recent  status  post angioplasty  ? Pulmonary embolism (HCC)   ? associated with DVT  ? Pulmonary embolus (HCC)   ? Thyroid disease   ? hyperthyroidism  ? ?Past Surgical History:  ?Procedure Laterality Date  ? AORTIC VALVE REPLACEMENT (AVR)/CORONARY ARTERY BYPASS GRAFTING (CABG)  08/16/2010  VAN TRIGT  ? AV replacement w/19-mm mechanical St Jude valve ([id number]),CABGX3  lima to LAD, SVG to distal circ,SVG to posterior descending  ? ASD REPAIR    ? CARDIAC CATHETERIZATION  08/08/2010  ? ,LAD 50% PROX,605 TANDEM SEGMENTAL STENOSIS,LEFT CIRC 60%-AV GROOVE, RGT COR 40% PROX ON THE BEND ,80% PROX PDA,  ? CORONARY ARTERY BYPASS GRAFT  08/16/2010  ? LIMA TO lad,svg to distal circ ,svg to posterior descending  ? DOPPLER ECHOCARDIOGRAPHY  08/09/2010  ? EF35% to 40% ,bbicuspid ;severely  thickened ,severely calcified leaflets, LV normal  ? LEA  doppler  08/07/2010  ? left popliteal occlusion ,and anterior tibial complete occlusion  ? NM MYOCAR PERF WALL MOTION  08/06/2010  ? EF 31%  LOW RISK SCAN  ? nuc    ? PV ANGIOGRAM  08/08/2010  ? successful PTA LEFT POPLITEAL  ? ?Social History  ? ?Socioeconomic History  ? Marital status: Married  ?  Spouse name: Not on file  ? Number of children: Not on file  ? Years of education: Not on file  ? Highest education level: Not on file  ?Occupational History  ? Not on file  ?Tobacco Use  ? Smoking status: Never  ? Smokeless tobacco: Never  ?Vaping Use  ? Vaping Use: Never used  ?Substance and Sexual Activity  ? Alcohol use: Yes  ?  Alcohol/week: 7.0 standard drinks  ?  Types: 7 Standard drinks or equivalent per week  ? Drug use: No  ? Sexual activity: Not Currently  ?Other Topics Concern  ? Not on file  ?Social History Narrative  ? Not on file  ? ?Social Determinants of Health  ? ?Financial Resource Strain: Not on file  ?Food Insecurity: Not on file  ?Transportation Needs: Not on file  ?Physical Activity: Not on file  ?Stress: Not on file  ?Social Connections: Not on file  ?Intimate Partner Violence: Not  on file  ? ? ? ?Medications: ?Outpatient Medications Prior to Visit  ?Medication Sig  ? aspirin EC 81 MG tablet Take 81 mg by mouth daily.  ? ciclopirox (LOPROX) 0.77 % SUSP Apply topically at bedtime. Remove with alcohol every 7 days.  ? furosemide (LASIX) 40 MG tablet TAKE 1/2 TABLET BY MOUTH DAILY.  ? levothyroxine (SYNTHROID) 50 MCG tablet TAKE 1 TABLET BY MOUTH DAILY BEFORE BREAKFAST.  ? metFORMIN (GLUCOPHAGE-XR) 500 MG 24 hr tablet TAKE 1 TABLET BY MOUTH IN THE MORNING AND 2 TABLETS AT NIGHT.  ? metoprolol succinate (TOPROL-XL) 100 MG 24 hr tablet TAKE 1 TABLET BY MOUTH DAILY. TAKE WITH OR IMMEDIATELY FOLLOWING A MEAL.  ? PRALUENT 150 MG/ML SOAJ INJECT 1 DOSE INTO THE SKIN EVERY 14 DAYS  ? sacubitril-valsartan (ENTRESTO) 24-26 MG Take 1 tablet by mouth 2 (two) times daily.  ? VITAMIN D PO Take by mouth.  ?  warfarin (COUMADIN) 2 MG tablet TAKE 1 TO 1 AND 1/2 TABLETS BY MOUTH DAILY AS DIRECTED BY COUMADIN CLINIC.  ? [DISCONTINUED] JARDIANCE 25 MG TABS tablet TAKE 1 TABLET BY MOUTH EVERY DAY  ? [DISCONTINUED] Semaglutide,0.25 or 0.5MG /DOS, (OZEMPIC, 0.25 OR 0.5 MG/DOSE,) 2 MG/1.5ML SOPN INJECT SUBCUTANEOUSLY 0.5 MG ONCE WEEKLY.  ? atorvastatin (LIPITOR) 40 MG tablet Take 1 tablet (40 mg total) by mouth daily.  ? ?No facility-administered medications prior to visit.  ? ? ?Review of Systems ?Review of Systems:  ?A fourteen system review of systems was performed and found to be positive as per HPI. ? ? ?Last CBC ?Lab Results  ?Component Value Date  ? WBC 6.5 07/23/2021  ? HGB 13.6 07/23/2021  ? HCT 41.1 07/23/2021  ? MCV 93 07/23/2021  ? MCH 30.8 07/23/2021  ? RDW 12.9 07/23/2021  ? PLT 267 07/23/2021  ? ?Last metabolic panel ?Lab Results  ?Component Value Date  ? GLUCOSE 140 (H) 07/23/2021  ? NA 146 (H) 07/23/2021  ? K 4.4 07/23/2021  ? CL 107 (H) 07/23/2021  ? CO2 22 07/23/2021  ? BUN 23 07/23/2021  ? CREATININE 0.70 07/23/2021  ? EGFR 97 07/23/2021  ? CALCIUM 9.2 07/23/2021  ? PROT 6.6 07/23/2021  ? ALBUMIN 4.4 07/23/2021  ? LABGLOB 2.2 07/23/2021  ? AGRATIO 2.0 07/23/2021  ? BILITOT 0.4 07/23/2021  ? ALKPHOS 62 07/23/2021  ? AST 29 07/23/2021  ? ALT 42 (H) 07/23/2021  ? ?Last lipids ?Lab Results  ?Component Value Date  ? CHOL 103 05/21/2021  ? HDL 44 05/21/2021  ? LDLCALC 37 05/21/2021  ? TRIG 120 05/21/2021  ? CHOLHDL 2.3 05/21/2021  ? ?Last hemoglobin A1c ?Lab Results  ?Component Value Date  ? HGBA1C 7.6 (H) 07/23/2021  ? ?Last thyroid functions ?Lab Results  ?Component Value Date  ? TSH 2.010 11/18/2019  ? ?Last vitamin D ?Lab Results  ?Component Value Date  ? VD25OH 52.2 11/18/2019  ? ?  ? Objective  ?  ?BP 128/62   Pulse 74   Temp 97.9 ?F (36.6 ?C)   Ht 5\' 3"  (1.6 m)   Wt 167 lb (75.8 kg)   LMP  (LMP Unknown)   SpO2 98%   BMI 29.58 kg/m?  ?BP Readings from Last 3 Encounters:  ?10/10/21 128/62  ?07/10/21  139/67  ?05/27/21 124/64  ? ?Wt Readings from Last 3 Encounters:  ?10/10/21 167 lb (75.8 kg)  ?07/10/21 165 lb (74.8 kg)  ?05/27/21 164 lb (74.4 kg)  ? ?  ? ?Physical Exam  ? ?General Appearance:     ?  Alert, cooperative, in no acute distress, appears stated age   ?Head:    Normocephalic, without obvious abnormality, atraumatic  ?Eyes:    PERRL, conjunctiva/corneas clear, EOM's intact, fundi  ?  benign, both eyes  ?Ears:    Normal TM's and external ear canals, both ears  ?Nose:   Nares normal, septum midline, mucosa normal, no drainage  ?  or sinus tenderness  ?Throat:   Lips, mucosa, and tongue normal; teeth and gums normal  ?Neck:   Supple, symmetrical, trachea midline, no adenopathy;  ?  thyroid:  no enlargement/tenderness/nodules; no carotid ?  bruit or JVD  ?Back:     Symmetric, no curvature, ROM normal, no CVA tenderness  ?Lungs:     Clear to auscultation bilaterally, respirations unlabored  ?Chest Wall:    No tenderness or deformity  ? Heart:    Normal heart rate. Normal rhythm.  ?systolic murmur  ?Breast Exam:    Inspection negative, Normal to palpation without dominant masses  ?Abdomen:     Soft, non-tender, bowel sounds active all four quadrants,  ?  no masses, no organomegaly  ?Pelvic:    no adnexal masses or tenderness, no cervical motion tenderness, uterus normal size, shape, and consistency, and vagina normal without discharge  ?Extremities:   All extremities are intact. No cyanosis or edema  ?Pulses:   2+ and symmetric all extremities  ?Skin:   Skin color, texture, turgor normal, no rashes or lesions  ?Lymph nodes:   Cervical, supraclavicular, and axillary nodes normal  ?Neurologic:   CNII-XII intact, normal strength, sensation and reflexes  ?  throughout  ? ? ? ?Last depression screening scores ?PHQ 2/9 Scores 10/10/2021 07/10/2021 05/16/2021  ?PHQ - 2 Score 0 0 0  ?PHQ- 9 Score 3 1 2   ? ?Last fall risk screening ?Fall Risk  10/10/2021  ?Falls in the past year? 0  ?Number falls in past yr: 0  ?Injury  with Fall? 0  ?Risk for fall due to : No Fall Risks  ?Follow up Falls evaluation completed  ? ? ? ?No results found for any visits on 10/10/21. ? Assessment & Plan  ?  ?Routine Health Maintenance and Physical Exam ? ?E

## 2021-10-10 ENCOUNTER — Other Ambulatory Visit: Payer: Self-pay | Admitting: Physician Assistant

## 2021-10-10 ENCOUNTER — Other Ambulatory Visit: Payer: Self-pay

## 2021-10-10 ENCOUNTER — Ambulatory Visit (INDEPENDENT_AMBULATORY_CARE_PROVIDER_SITE_OTHER): Payer: BC Managed Care – PPO | Admitting: Physician Assistant

## 2021-10-10 ENCOUNTER — Encounter: Payer: Self-pay | Admitting: Physician Assistant

## 2021-10-10 ENCOUNTER — Other Ambulatory Visit (HOSPITAL_COMMUNITY)
Admission: RE | Admit: 2021-10-10 | Discharge: 2021-10-10 | Disposition: A | Discharge status: Home / Self Care | Payer: BC Managed Care – PPO | Source: Ambulatory Visit | Attending: Physician Assistant | Admitting: Physician Assistant

## 2021-10-10 VITALS — BP 128/62 | HR 74 | Temp 97.9°F | Ht 63.0 in | Wt 167.0 lb

## 2021-10-10 DIAGNOSIS — Z1329 Encounter for screening for other suspected endocrine disorder: Secondary | ICD-10-CM

## 2021-10-10 DIAGNOSIS — E1159 Type 2 diabetes mellitus with other circulatory complications: Secondary | ICD-10-CM

## 2021-10-10 DIAGNOSIS — E1169 Type 2 diabetes mellitus with other specified complication: Secondary | ICD-10-CM

## 2021-10-10 DIAGNOSIS — Z0000 Encounter for general adult medical examination without abnormal findings: Secondary | ICD-10-CM | POA: Diagnosis not present

## 2021-10-10 DIAGNOSIS — Z124 Encounter for screening for malignant neoplasm of cervix: Secondary | ICD-10-CM

## 2021-10-10 DIAGNOSIS — E1165 Type 2 diabetes mellitus with hyperglycemia: Secondary | ICD-10-CM

## 2021-10-10 DIAGNOSIS — Z23 Encounter for immunization: Secondary | ICD-10-CM

## 2021-10-10 MED ORDER — OZEMPIC (0.25 OR 0.5 MG/DOSE) 2 MG/1.5ML ~~LOC~~ SOPN: 0.5000 mg | PEN_INJECTOR | SUBCUTANEOUS | 0 refills | Status: DC

## 2021-10-10 NOTE — Patient Instructions (Signed)

## 2021-10-14 ENCOUNTER — Other Ambulatory Visit: Payer: Self-pay

## 2021-10-14 ENCOUNTER — Ambulatory Visit (INDEPENDENT_AMBULATORY_CARE_PROVIDER_SITE_OTHER): Payer: BC Managed Care – PPO

## 2021-10-14 ENCOUNTER — Encounter: Payer: Self-pay | Admitting: Physician Assistant

## 2021-10-14 DIAGNOSIS — Z952 Presence of prosthetic heart valve: Secondary | ICD-10-CM | POA: Diagnosis not present

## 2021-10-14 DIAGNOSIS — Z7901 Long term (current) use of anticoagulants: Secondary | ICD-10-CM

## 2021-10-14 LAB — POCT INR: INR: 3.1 — AB (ref 2.0–3.0)

## 2021-10-14 NOTE — Patient Instructions (Signed)
Continue taking 1 tablet daily except 1.5 tablets each Monday, Wednesday and Friday.  Repeat INR in 6 weeks. [facility] [phone] ?

## 2021-10-15 LAB — CYTOLOGY - PAP
Comment: NEGATIVE
Diagnosis: UNDETERMINED — AB
High risk HPV: NEGATIVE

## 2021-10-21 ENCOUNTER — Other Ambulatory Visit: Payer: Self-pay | Admitting: Internal Medicine

## 2021-10-21 ENCOUNTER — Other Ambulatory Visit: Payer: Self-pay | Admitting: Physician Assistant

## 2021-10-21 DIAGNOSIS — E1169 Type 2 diabetes mellitus with other specified complication: Secondary | ICD-10-CM

## 2021-10-28 ENCOUNTER — Other Ambulatory Visit: Payer: Medicare PPO

## 2021-10-28 DIAGNOSIS — Z0000 Encounter for general adult medical examination without abnormal findings: Secondary | ICD-10-CM

## 2021-10-28 DIAGNOSIS — E1169 Type 2 diabetes mellitus with other specified complication: Secondary | ICD-10-CM

## 2021-10-28 DIAGNOSIS — I152 Hypertension secondary to endocrine disorders: Secondary | ICD-10-CM

## 2021-10-28 DIAGNOSIS — Z130 Encounter for screening for diseases of the blood and blood-forming organs and certain disorders involving the immune mechanism: Secondary | ICD-10-CM

## 2021-10-29 LAB — COMPREHENSIVE METABOLIC PANEL
ALT: 40 IU/L — ABNORMAL HIGH (ref 0–32)
AST: 27 IU/L (ref 0–40)
Albumin/Globulin Ratio: 2.2 (ref 1.2–2.2)
Albumin: 4.6 g/dL (ref 3.8–4.8)
Alkaline Phosphatase: 64 IU/L (ref 44–121)
BUN/Creatinine Ratio: 34 — ABNORMAL HIGH (ref 12–28)
BUN: 23 mg/dL (ref 8–27)
Bilirubin Total: 0.4 mg/dL (ref 0.0–1.2)
CO2: 19 mmol/L — ABNORMAL LOW (ref 20–29)
Calcium: 9.4 mg/dL (ref 8.7–10.3)
Chloride: 103 mmol/L (ref 96–106)
Creatinine, Ser: 0.68 mg/dL (ref 0.57–1.00)
Globulin, Total: 2.1 g/dL (ref 1.5–4.5)
Glucose: 155 mg/dL — ABNORMAL HIGH (ref 70–99)
Potassium: 4.5 mmol/L (ref 3.5–5.2)
Sodium: 139 mmol/L (ref 134–144)
Total Protein: 6.7 g/dL (ref 6.0–8.5)
eGFR: 97 mL/min/{1.73_m2} (ref >59)

## 2021-10-29 LAB — LIPID PANEL
Chol/HDL Ratio: 2.6 ratio (ref 0.0–4.4)
Cholesterol, Total: 117 mg/dL (ref 100–199)
HDL: 45 mg/dL (ref >39)
LDL Chol Calc (NIH): 41 mg/dL (ref 0–99)
Triglycerides: 195 mg/dL — ABNORMAL HIGH (ref 0–149)
VLDL Cholesterol Cal: 31 mg/dL (ref 5–40)

## 2021-10-29 LAB — CBC WITH DIFFERENTIAL/PLATELET
Basophils Absolute: 0 10*3/uL (ref 0.0–0.2)
Basos: 0 %
EOS (ABSOLUTE): 0.1 10*3/uL (ref 0.0–0.4)
Eos: 2 %
Hematocrit: 44 % (ref 34.0–46.6)
Hemoglobin: 14.2 g/dL (ref 11.1–15.9)
Immature Grans (Abs): 0 10*3/uL (ref 0.0–0.1)
Immature Granulocytes: 0 %
Lymphocytes Absolute: 2.8 10*3/uL (ref 0.7–3.1)
Lymphs: 38 %
MCH: 31 pg (ref 26.6–33.0)
MCHC: 32.3 g/dL (ref 31.5–35.7)
MCV: 96 fL (ref 79–97)
Monocytes Absolute: 0.6 10*3/uL (ref 0.1–0.9)
Monocytes: 8 %
Neutrophils Absolute: 3.8 10*3/uL (ref 1.4–7.0)
Neutrophils: 52 %
Platelets: 280 10*3/uL (ref 150–450)
RBC: 4.58 x10E6/uL (ref 3.77–5.28)
RDW: 13.2 % (ref 11.7–15.4)
WBC: 7.4 10*3/uL (ref 3.4–10.8)

## 2021-10-29 LAB — HEMOGLOBIN A1C
Est. average glucose Bld gHb Est-mCnc: 157 mg/dL
Hgb A1c MFr Bld: 7.1 % — ABNORMAL HIGH (ref 4.8–5.6)

## 2021-10-29 LAB — TSH: TSH: 1.28 u[IU]/mL (ref 0.450–4.500)

## 2021-11-12 ENCOUNTER — Telehealth: Payer: Self-pay | Admitting: Physician Assistant

## 2021-11-12 ENCOUNTER — Other Ambulatory Visit: Payer: Self-pay

## 2021-11-12 DIAGNOSIS — E1165 Type 2 diabetes mellitus with hyperglycemia: Secondary | ICD-10-CM

## 2021-11-12 MED ORDER — OZEMPIC (0.25 OR 0.5 MG/DOSE) 2 MG/1.5ML ~~LOC~~ SOPN: 0.5000 mg | PEN_INJECTOR | SUBCUTANEOUS | 0 refills | Status: DC

## 2021-11-12 NOTE — Telephone Encounter (Signed)
Sent refill for 90 days. Patient notified.  ?

## 2021-11-12 NOTE — Telephone Encounter (Signed)
Patient has new insurance and they will only cover the Ozempic for a 90 day supply so she is requesting it to be sent over to [facility]. Please advise. [phone] ?

## 2021-11-19 ENCOUNTER — Encounter: Payer: Self-pay | Admitting: Internal Medicine

## 2021-11-19 MED ORDER — ENTRESTO 24-26 MG PO TABS: 1.0000 | ORAL_TABLET | Freq: Two times a day (BID) | ORAL | 1 refills | Status: DC

## 2021-11-27 ENCOUNTER — Ambulatory Visit: Payer: Medicare PPO | Admitting: Internal Medicine

## 2021-11-27 ENCOUNTER — Encounter: Payer: Self-pay | Admitting: Internal Medicine

## 2021-11-27 ENCOUNTER — Telehealth: Payer: Self-pay | Admitting: Internal Medicine

## 2021-11-27 VITALS — BP 130/62 | HR 85 | Ht 63.0 in | Wt 165.0 lb

## 2021-11-27 DIAGNOSIS — Z951 Presence of aortocoronary bypass graft: Secondary | ICD-10-CM | POA: Diagnosis not present

## 2021-11-27 DIAGNOSIS — I447 Left bundle-branch block, unspecified: Secondary | ICD-10-CM | POA: Diagnosis not present

## 2021-11-27 DIAGNOSIS — I255 Ischemic cardiomyopathy: Secondary | ICD-10-CM

## 2021-11-27 DIAGNOSIS — E7849 Other hyperlipidemia: Secondary | ICD-10-CM

## 2021-11-27 DIAGNOSIS — Z952 Presence of prosthetic heart valve: Secondary | ICD-10-CM

## 2021-11-27 MED ORDER — REPATHA SURECLICK 140 MG/ML ~~LOC~~ SOAJ
1.0000 | SUBCUTANEOUS | 3 refills | Status: DC
Start: 2021-11-27 — End: 2022-12-08

## 2021-11-27 NOTE — Patient Instructions (Signed)
Medication Instructions:  ?CHANGE Praluent >> Repatha ? ?*If you need a refill on your cardiac medications before your next appointment, please call your pharmacy* ? ? ?Lab Work: ?FASTING lipid panel in 6 months ? ?If you have labs (blood work) drawn today and your tests are completely normal, you will receive your results only by: ?MyChart Message (if you have MyChart) OR ?A paper copy in the mail ?If you have any lab test that is abnormal or we need to change your treatment, we will call you to review the results. ? ? ?Testing/Procedures: ?NONE ? ? ?Follow-Up: ?At [facility], you and your health needs are our priority.  As part of our continuing mission to provide you with exceptional heart care, we have created designated Provider Care Teams.  These Care Teams include your primary Cardiologist (physician) and Advanced Practice Providers (APPs -  Physician Assistants and Nurse Practitioners) who all work together to provide you with the care you need, when you need it. ? ?We recommend signing up for the patient portal called "MyChart".  Sign up information is provided on this After Visit Summary.  MyChart is used to connect with patients for Virtual Visits (Telemedicine).  Patients are able to view lab/test results, encounter notes, upcoming appointments, etc.  Non-urgent messages can be sent to your provider as well.   ?To learn more about what you can do with MyChart, go to [email or web address].   ? ?Your next appointment:   ?6 month(s) ? ?The format for your next appointment:   ?In Person ? ?Provider:   ?Pixie Casino, MD { ? ?

## 2021-11-27 NOTE — Telephone Encounter (Signed)
Patient states she has new insurance, that prefers Repatha instead of Praluent ? ?PA for Repatha submitted via CMM ?(Key: [id number]) ?

## 2021-11-27 NOTE — Progress Notes (Signed)
? ? ?Chief Complaint:  ?Follow-up ? ?Primary Care Physician: ?Lorrene Reid, PA-C ? ?HPI:  ?Katrina Obrien is a 66 year old female who has a history of an unfortunate episode with left popliteal artery thrombus, PE, severe aortic stenosis which was ultimately diagnosed in a bicuspid aortic valve, as well as multivessel coronary disease, anterior MI, EF about 40% to 45% - all which came to a head about the same time. She underwent 3-vessel bypass as well as mechanical aortic valve replacement. She has done well on Coumadin and was ultimately diagnosed with diabetes and is followed by Dr. Chalmers Cater for this. Now she is markedly improved, although she has an ischemic cardiomyopathy with an EF of 40% to 45%. Her activity level is pretty good.  We recently performed repeat lipid testing, which demonstrated an elevated LDL particle number of 2163. The calculated LDL was 80. I asked her to increase her Crestor to 40 mg at night and add 500 mg Niaspan. She reports significant flushing and intolerance to the Niaspan, despite taking aspirin 30 minutes prior.  Her laboratory work does look improved, with her particle number being reduced to 1547, LDL 55, HDL of 39.  She does report however worsening pain in her capsule when she walks that works its way up her legs. This is nothing as significant as when she was originally diagnosed with her acute arterial thrombus, but has concerns for claudication. Alternatively, this could represent myalgias from her statin. ? ?Katrina Obrien returns today for followup. She is doing extremely well. She continues to be active and has no complaints of chest pain worsening shortness of breath. She does feel like her legs tire easily however this could be improved with more exercise. She denies any symptoms of claudication. Her INR has been therapeutic. She reports good blood sugar control and has an appointment with her endocrinologist in the next couple of weeks. We recently obtained an  echocardiogram which showed normalization of her EF to 55-60% in March of 2015. She's also had lower extremity arterial Dopplers which show preserved ABIs. ? ?I the pleasure seeing Katrina Obrien back in the office today. She reports doing fairly well except occasionally she gets somewhat "swimmy headed". This is worse with change of position. As previously noted her EF has improved back to 55-60%. Recently she's had persistently elevated lipoprotein particles including an LDL particle number in the 1900s. This is despite being on Zetia and Crestor 40 mg. Based on this I recommended that she start on Pralulent. She's been taking this now for just over a month and is having no problems with injection site reactions or side effects. We will plan to check her cholesterol again in about 2 months. ? ?Katrina Obrien was seen in the office today in follow-up. Overall she seems to be doing very well. 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 She will be due for repeat cholesterol test after the first of the year. She denies any chest pain or worsening shortness of breath. She is complaining of some occasional swelling in her lower extremities which improves in the morning. She also gets some pain in her right leg. She is overdue for repeat lower extremity Dopplers and does have a history of course of arterial thrombus in the leg. Warfarin has been therapeutic and managed by the Coumadin clinic. Cholesterol is still not at goal despite max dose therapy. For brief period of time she was on samples of Repatha (PCSK9 inhibitor) with a marked response in cholesterol, however insurance  would not cover this. ? ?12/10/2015 ? ?Katrina Obrien returns today for follow-up. She says this is the best she has felt some time. She denies any chest pain or worsening shortness of breath. Recently her cholesterol numbers have risen with LDL now 102. This would put her not at goal therapy. Given her multiple comorbidities. Recent data indicates the fact  that driving LDL below 50 may improve cardiovascular outcomes. This was with the use of maximal statin therapy as well as the addition of a PCSK9 inhibitor, specifically Repatha. In fact, she had responded very well to Pralulent in the past, with LDL as low as 20 however that was not covered by insurance. 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 Aggressive therapy is warranted. ? ?07/15/2016 ? ?Katrina Obrien returns today for follow-up. Overall she feels very well. She had one episode of intense burning in her chest which happened a few weeks ago. It was worse when she was laying down at night. She did not take any medicine for that she was concerned she could take it with her other medicines. It sounds like reflux she's not had any more symptoms like that. I told her she could use Tums or an H2 blocker for that as needed. She recently went on to [place] and her LDL cholesterol is now 19. This should be a significant benefit in slowing her overall cardiovascular risks. As mentioned previously she had an echo this past year which showed normal LVEF 55-60% and a normally functioning mechanical aortic valve. Her INR was therapeutic today. ? ?07/09/2017 ? ?Katrina Obrien was seen today in follow-up.  She seems to be doing very well.  She reported that she had to shovel snow for several hours with her son and at the end of that did develop some chest tightness/soreness which went away very quickly.  She said she felt like she overworked herself, but generally has had no symptoms.  Recently we repeated a lipid profile which showed marked improvement in her LDL to 28 on statin, ezetimibe and Repatha.  Her triglycerides were elevated at 349, however she mentioned today that this was nonfasting.  Also her blood sugar was elevated recently.  She was started on Jardiance, which I suspect has made a big difference in her blood sugars.  She says  her hemoglobin A1c now is down to 6.1.  Accordingly, her triglycerides have probably improved as well.  Fortunately, her husband was not with her today.  Apparently he is suffering from depression and has had several bouts.  He is not interested in leaving the house and is not as engaged in activities as he has been in the past.  This is obviously causing her a lot of stress and is not clear to her how she can help him. ? ?03/19/2018 ? ?Katrina Obrien returns today for follow-up.  She says she is felt today the best she has in years.  Unfortunately, she had a recent colonoscopy which turned out okay however having to come off all of her medications was challenging for her and she was not a fan of bridging Lovenox.  She does report her blood sugars been well controlled.  Her cholesterol is at goal with recent LDL of 58 however it is actually been lower with Repatha.  Her triglycerides are also well controlled at 110.  Her Saint Jude mechanical valve is functioning properly by echo in 2017 which showed normal LVEF.  She denies  any chest pain symptoms with history of three-vessel CABG in 2012.  Blood pressure is at goal today as well.  EKG shows a stable left bundle branch block. ? ?09/16/2018 ? ?Katrina Obrien is seen today in follow-up.  She is excited at the fact that she will be having a new grandbaby fairly soon.  Overall she is doing well without new symptoms.  She denies any chest pain or worsening shortness of breath.  Her INR was repeated today was 3.5.  No adjustments were made to her warfarin.  Her mechanical valve is working properly and was last assessed by echo last year.  She has lost some additional weight.  Her blood pressure was elevated little today however she has been under some stress and very recently took her medications.  ? ?09/28/2019 ? ?Katrina Obrien returns today for follow-up.  Overall she is doing well.  She said she has had both coronavirus vaccines.  She is back teaching in the classroom.  Unfortunately she has  been struggling with some weight gain.  This was due to stress and other issues with her father-in-law who ultimately died and also having to care for her husband.  Her triglycerides were elevated over the summ

## 2021-11-27 NOTE — Telephone Encounter (Signed)
PA Case: [id number], Status: Approved, Coverage Starts on: 07/28/2021 12:00:00 AM, Coverage Ends on: 07/27/2022 12:00:00 AM ?

## 2021-12-09 ENCOUNTER — Ambulatory Visit (INDEPENDENT_AMBULATORY_CARE_PROVIDER_SITE_OTHER): Payer: Medicare PPO

## 2021-12-09 DIAGNOSIS — Z7901 Long term (current) use of anticoagulants: Secondary | ICD-10-CM

## 2021-12-09 DIAGNOSIS — Z952 Presence of prosthetic heart valve: Secondary | ICD-10-CM | POA: Diagnosis not present

## 2021-12-09 LAB — POCT INR: INR: 3.3 — AB (ref 2.0–3.0)

## 2021-12-09 NOTE — Patient Instructions (Signed)
Continue taking 1 tablet daily except 1.5 tablets each Monday, Wednesday and Friday. Repeat INR in 8 weeks. Coumadin Clinic [phone] 

## 2021-12-10 ENCOUNTER — Other Ambulatory Visit: Payer: Self-pay | Admitting: Physician Assistant

## 2022-01-15 ENCOUNTER — Other Ambulatory Visit: Payer: Self-pay | Admitting: Internal Medicine

## 2022-02-03 ENCOUNTER — Ambulatory Visit (INDEPENDENT_AMBULATORY_CARE_PROVIDER_SITE_OTHER): Payer: Medicare PPO

## 2022-02-03 DIAGNOSIS — Z952 Presence of prosthetic heart valve: Secondary | ICD-10-CM

## 2022-02-03 DIAGNOSIS — Z7901 Long term (current) use of anticoagulants: Secondary | ICD-10-CM

## 2022-02-03 LAB — POCT INR: INR: 3.1 — AB (ref 2.0–3.0)

## 2022-02-03 NOTE — Patient Instructions (Signed)
Description   Continue taking 1 tablet daily except 1.5 tablets each Monday, Wednesday and Friday. Repeat INR in 8 weeks.  [facility] [phone]      

## 2022-02-11 ENCOUNTER — Ambulatory Visit (INDEPENDENT_AMBULATORY_CARE_PROVIDER_SITE_OTHER): Payer: Medicare PPO | Admitting: Physician Assistant

## 2022-02-11 ENCOUNTER — Ambulatory Visit: Payer: BC Managed Care – PPO | Admitting: Nurse Practitioner

## 2022-02-11 ENCOUNTER — Encounter: Payer: Self-pay | Admitting: Physician Assistant

## 2022-02-11 VITALS — BP 148/68 | HR 78 | Wt 164.0 lb

## 2022-02-11 DIAGNOSIS — E1159 Type 2 diabetes mellitus with other circulatory complications: Secondary | ICD-10-CM | POA: Diagnosis not present

## 2022-02-11 DIAGNOSIS — E1165 Type 2 diabetes mellitus with hyperglycemia: Secondary | ICD-10-CM | POA: Diagnosis not present

## 2022-02-11 DIAGNOSIS — M79672 Pain in left foot: Secondary | ICD-10-CM

## 2022-02-11 DIAGNOSIS — I152 Hypertension secondary to endocrine disorders: Secondary | ICD-10-CM

## 2022-02-11 DIAGNOSIS — E1169 Type 2 diabetes mellitus with other specified complication: Secondary | ICD-10-CM | POA: Diagnosis not present

## 2022-02-11 DIAGNOSIS — E785 Hyperlipidemia, unspecified: Secondary | ICD-10-CM

## 2022-02-11 DIAGNOSIS — M25552 Pain in left hip: Secondary | ICD-10-CM

## 2022-02-11 LAB — POCT UA - MICROALBUMIN
Albumin/Creatinine Ratio, Urine, POC: <30
Creatinine, POC: 100 mg/dL
Microalbumin Ur, POC: 30 mg/L

## 2022-02-11 LAB — POCT GLYCOSYLATED HEMOGLOBIN (HGB A1C): Hemoglobin A1C: 6.9 % — AB (ref 4.0–5.6)

## 2022-02-11 MED ORDER — PREDNISONE 20 MG PO TABS: ORAL_TABLET | ORAL | 0 refills | Status: DC

## 2022-02-11 NOTE — Patient Instructions (Signed)
Plantar Fasciitis  Plantar fasciitis is a painful foot condition that affects the heel. It occurs when the band of tissue that connects the toes to the heel bone (plantar fascia) becomes irritated. This can happen as the result of exercising too much or doing other repetitive activities (overuse injury). Plantar fasciitis can cause mild irritation to severe pain that makes it difficult to walk or move. The pain is usually worse in the morning after sleeping, or after sitting or lying down for a period of time. Pain may also be worse after long periods of walking or standing. What are the causes? This condition may be caused by: Standing for long periods of time. Wearing shoes that do not have good arch support. Doing activities that put stress on joints (high-impact activities). This includes ballet and exercise that makes your heart beat faster (aerobic exercise), such as running. Being overweight. An abnormal way of walking (gait). Tight muscles in the back of your lower leg (calf). High arches in your feet or flat feet. Starting a new athletic activity. What are the signs or symptoms? The main symptom of this condition is heel pain. Pain may get worse after the following: Taking the first steps after a time of rest, especially in the morning after awakening, or after you have been sitting or lying down for a while. Long periods of standing still. Pain may decrease after 30-45 minutes of activity, such as gentle walking. How is this diagnosed? This condition may be diagnosed based on your medical history, a physical exam, and your symptoms. Your health care provider will check for: A tender area on the bottom of your foot. A high arch in your foot or flat feet. Pain when you move your foot. Difficulty moving your foot. You may have imaging tests to confirm the diagnosis, such as: X-rays. Ultrasound. MRI. How is this treated? Treatment for plantar fasciitis depends on how severe your  condition is. Treatment may include: Rest, ice, pressure (compression), and raising (elevating) the affected foot. This is called RICE therapy. Your health care provider may recommend RICE therapy along with over-the-counter pain medicines to manage your pain. Exercises to stretch your calves and your plantar fascia. A splint that holds your foot in a stretched, upward position while you sleep (night splint). Physical therapy to relieve symptoms and prevent problems in the future. Injections of steroid medicine (cortisone) to relieve pain and inflammation. Stimulating your plantar fascia with electrical impulses (extracorporeal shock wave therapy). This is usually the last treatment option before surgery. Surgery, if other treatments have not worked after 12 months. Follow these instructions at home: Managing pain, stiffness, and swelling  If directed, put ice on the painful area. To do this: Put ice in a plastic bag, or use a frozen bottle of water. Place a towel between your skin and the bag or bottle. Roll the bottom of your foot over the bag or bottle. Do this for 20 minutes, 2-3 times a day. Wear athletic shoes that have air-sole or gel-sole cushions, or try soft shoe inserts that are designed for plantar fasciitis. Elevate your foot above the level of your heart while you are sitting or lying down. Activity Avoid activities that cause pain. Ask your health care provider what activities are safe for you. Do physical therapy exercises and stretches as told by your health care provider. Try activities and forms of exercise that are easier on your joints (low impact). Examples include swimming, water aerobics, and biking. General instructions Take over-the-counter   and prescription medicines only as told by your health care provider. Wear a night splint while sleeping, if told by your health care provider. Loosen the splint if your toes tingle, become numb, or turn cold and blue. Maintain a  healthy weight, or work with your health care provider to lose weight as needed. Keep all follow-up visits. This is important. Contact a health care provider if you have: Symptoms that do not go away with home treatment. Pain that gets worse. Pain that affects your ability to move or do daily activities. Summary Plantar fasciitis is a painful foot condition that affects the heel. It occurs when the band of tissue that connects the toes to the heel bone (plantar fascia) becomes irritated. Heel pain is the main symptom of this condition. It may get worse after exercising too much or standing still for a long time. Treatment varies, but it usually starts with rest, ice, pressure (compression), and raising (elevating) the affected foot. This is called RICE therapy. Over-the-counter medicines can also be used to manage pain. This information is not intended to replace advice given to you by your health care provider. Make sure you discuss any questions you have with your health care provider. Document Revised: 10/31/2019 Document Reviewed: 10/31/2019 Elsevier Patient Education  [place].  Plantar Fasciitis Rehab Ask your health care provider which exercises are safe for you. Do exercises exactly as told by your health care provider and adjust them as directed. It is normal to feel mild stretching, pulling, tightness, or discomfort as you do these exercises. Stop right away if you feel sudden pain or your pain gets worse. Do not begin these exercises until told by your health care provider. Stretching and range-of-motion exercises These exercises warm up your muscles and joints and improve the movement and flexibility of your foot. These exercises also help to relieve pain. Plantar fascia stretch  Sit with your left / right leg crossed over your opposite knee. Hold your heel with one hand with that thumb near your arch. With your other hand, hold your toes and gently pull them back toward  the top of your foot. You should feel a stretch on the base (bottom) of your toes, or the bottom of your foot (plantar fascia), or both. Hold this stretch for__________ seconds. Slowly release your toes and return to the starting position. Repeat __________ times. Complete this exercise __________ times a day. Gastrocnemius stretch, standing This exercise is also called a calf (gastroc) stretch. It stretches the muscles in the back of the upper calf. Stand with your hands against a wall. Extend your left / right leg behind you, and bend your front knee slightly. Keeping your heels on the floor, your toes facing forward, and your back knee straight, shift your weight toward the wall. Do not arch your back. You should feel a gentle stretch in your upper calf. Hold this position for __________ seconds. Repeat __________ times. Complete this exercise __________ times a day. Soleus stretch, standing This exercise is also called a calf (soleus) stretch. It stretches the muscles in the back of the lower calf. Stand with your hands against a wall. Extend your left / right leg behind you, and bend your front knee slightly. Keeping your heels on the floor and your toes facing forward, bend your back knee and shift your weight slightly over your back leg. You should feel a gentle stretch deep in your lower calf. Hold this position for __________ seconds. Repeat __________ times. Complete  this exercise __________ times a day. Gastroc and soleus stretch, standing step This exercise stretches the muscles in the back of the lower leg. These muscles are in the upper calf (gastrocnemius) and the lower calf (soleus). Stand with the ball of your left / right foot on the front of a step. The ball of your foot is on the walking surface, right under your toes. Keep your other foot firmly on the same step. Hold on to the wall or a railing for balance. Slowly lift your other foot, allowing your body weight to press  your heel down over the edge of the front of the step. Keep knee straight and unbent. You should feel a stretch in your calf. Hold this position for __________ seconds. Return both feet to the step. Repeat this exercise with a slight bend in your left / right knee. Repeat __________ times with your left / right knee straight and __________ times with your left / right knee bent. Complete this exercise __________ times a day. Balance exercise This exercise builds your balance and strength control of your arch to help take pressure off your plantar fascia. Single leg stand If this exercise is too easy, you can try it with your eyes closed or while standing on a pillow. Without shoes, stand near a railing or in a doorway. You may hold on to the railing or door frame as needed. Stand on your left / right foot. Keep your big toe down on the floor and lift the arch of your foot. You should feel a stretch across the bottom of your foot and your arch. Do not let your foot roll inward. Hold this position for __________ seconds. Repeat __________ times. Complete this exercise __________ times a day. This information is not intended to replace advice given to you by your health care provider. Make sure you discuss any questions you have with your health care provider. Document Revised: 04/26/2020 Document Reviewed: 04/26/2020 Elsevier Patient Education  [place].

## 2022-02-11 NOTE — Assessment & Plan Note (Signed)
-  Last lipid panel: HDL 45, LDL 41 (at goal<70). -Continue current medication regimen. -Will continue to monitor.

## 2022-02-11 NOTE — Assessment & Plan Note (Addendum)
-  A1c has improved from 7.1 to 6.9, will continue current medication regimen. Recommend to resume ambulatory glucose monitoring. Will continue to monitor. UA microalbumin collected, normal (patient on ARB therapy).

## 2022-02-11 NOTE — Progress Notes (Signed)
Established patient visit   Patient: Katrina Obrien   DOB: 02-22-1956   66 y.o. Female  MRN: [id number] Visit Date: 02/11/2022  Chief Complaint  Patient presents with   Diabetes   Hypertension   Hyperlipidemia   Subjective    HPI  Patient presents for chronic follow-up. Patient reports having left heel pain x 6 months which has progressed the last 1-2 months. Does report stiffness. No swelling or redness. Describes pain as stabbing. States mostly wears sketchers. Denies numbness or tingling sensation. Also reports intermittent left hip pain. Going up stairs is painful.   Diabetes mellitus: Pt denies increased urination or thirst. Pt reports medication compliance. No hypoglycemic events. Has not checked sugar consistently.   HTN: Pt denies chest pain, palpitations, dizziness or lower extremity swelling. Taking medication as directed without side effects. Checks BP at home and readings range 120-130/70-80. Pt follows a low salt diet.  HLD: Pt taking medication as directed without issues. Denies side effects including myalgias and RUQ pain. Walking 2-3 miles per day.    Medications: Outpatient Medications Prior to Visit  Medication Sig   aspirin EC 81 MG tablet Take 81 mg by mouth daily.   atorvastatin (LIPITOR) 40 MG tablet TAKE 1 TABLET (40 MG TOTAL) BY MOUTH DAILY.   Evolocumab (REPATHA SURECLICK) 140 MG/ML SOAJ Inject 1 Dose into the skin every 14 (fourteen) days.   furosemide (LASIX) 40 MG tablet TAKE 1/2 TABLET BY MOUTH DAILY.   JARDIANCE 25 MG TABS tablet TAKE 1 TABLET BY MOUTH EVERY DAY   levothyroxine (SYNTHROID) 50 MCG tablet TAKE 1 TABLET BY MOUTH DAILY BEFORE BREAKFAST.   metFORMIN (GLUCOPHAGE-XR) 500 MG 24 hr tablet TAKE 1 TABLET BY MOUTH IN THE MORNING AND 2 TABLETS AT NIGHT.   metoprolol succinate (TOPROL-XL) 100 MG 24 hr tablet TAKE 1 TABLET BY MOUTH DAILY. TAKE WITH OR IMMEDIATELY FOLLOWING A MEAL.   sacubitril-valsartan (ENTRESTO) 24-26 MG Take 1 tablet by mouth 2  (two) times daily.   Semaglutide,0.25 or 0.5MG /DOS, (OZEMPIC, 0.25 OR 0.5 MG/DOSE,) 2 MG/1.5ML SOPN Inject 0.5 mg into the skin once a week. INJECT SUBCUTANEOUSLY 0.5 MG ONCE WEEKLY.   VITAMIN D PO Take by mouth.   warfarin (COUMADIN) 2 MG tablet TAKE 1 TO 1 AND 1/2 TABLETS BY MOUTH DAILY AS DIRECTED BY COUMADIN CLINIC.   [DISCONTINUED] ciclopirox (LOPROX) 0.77 % SUSP Apply topically at bedtime. Remove with alcohol every 7 days.   No facility-administered medications prior to visit.    Review of Systems Review of Systems:  A fourteen system review of systems was performed and found to be positive as per HPI.  Last CBC Lab Results  Component Value Date   WBC 7.4 10/28/2021   HGB 14.2 10/28/2021   HCT 44.0 10/28/2021   MCV 96 10/28/2021   MCH 31.0 10/28/2021   RDW 13.2 10/28/2021   PLT 280 10/28/2021   Last metabolic panel Lab Results  Component Value Date   GLUCOSE 155 (H) 10/28/2021   NA 139 10/28/2021   K 4.5 10/28/2021   CL 103 10/28/2021   CO2 19 (L) 10/28/2021   BUN 23 10/28/2021   CREATININE 0.68 10/28/2021   EGFR 97 10/28/2021   CALCIUM 9.4 10/28/2021   PROT 6.7 10/28/2021   ALBUMIN 4.6 10/28/2021   LABGLOB 2.1 10/28/2021   AGRATIO 2.2 10/28/2021   BILITOT 0.4 10/28/2021   ALKPHOS 64 10/28/2021   AST 27 10/28/2021   ALT 40 (H) 10/28/2021   Last lipids Lab Results  Component Value Date   CHOL 117 10/28/2021   HDL 45 10/28/2021   LDLCALC 41 10/28/2021   TRIG 195 (H) 10/28/2021   CHOLHDL 2.6 10/28/2021   Last hemoglobin A1c Lab Results  Component Value Date   HGBA1C 6.9 (A) 02/11/2022   Last thyroid functions Lab Results  Component Value Date   TSH 1.280 10/28/2021     Objective    BP (!) 148/68   Pulse 78   Wt 164 lb (74.4 kg)   LMP  (LMP Unknown)   SpO2 99% Comment: on RA  BMI 29.05 kg/m  BP Readings from Last 3 Encounters:  02/11/22 (!) 148/68  11/27/21 130/62  10/10/21 128/62   Wt Readings from Last 3 Encounters:  02/11/22 164 lb  (74.4 kg)  11/27/21 165 lb (74.8 kg)  10/10/21 167 lb (75.8 kg)    Physical Exam  General:  Well Developed, well nourished, appropriate for stated age.  Neuro:  Alert and oriented,  extra-ocular muscles intact  HEENT:  Normocephalic, atraumatic, neck supple  Skin:  no gross rash, warm, pink. Cardiac:  RRR, S1 S2 Respiratory: CTA B/L  MSK: good ROM of both feet, tenderness to deep palpation of plantar fascia, no deformity, tenderness over left greater trochanter area, discomfort with internal and external hip rotation Vascular:  Ext warm, no cyanosis apprec.; cap RF less 2 sec. Psych:  No HI/SI, judgement and insight good, Euthymic mood. Full Affect.   Results for orders placed or performed in visit on 02/11/22  POCT glycosylated hemoglobin (Hb A1C)  Result Value Ref Range   Hemoglobin A1C 6.9 (A) 4.0 - 5.6 %   HbA1c POC (<> result, manual entry)     HbA1c, POC (prediabetic range)     HbA1c, POC (controlled diabetic range)    POCT UA - Microalbumin  Result Value Ref Range   Microalbumin Ur, POC 30 mg/L   Creatinine, POC 100 mg/dL   Albumin/Creatinine Ratio, Urine, POC <30     Assessment & Plan      Problem List Items Addressed This Visit       Cardiovascular and Mediastinum   Hypertension associated with diabetes (HCC) (Chronic)    -BP elevated on intake. BP repeated with mild improvement. Patient's BP at cardiology visit May 2023 was stable at 130/62 and ambulatory BP readings have been stable as well so will continue current medication regimen. Will reassess BP at f/up visit. Will continue to monitor.        Endocrine   Hyperlipidemia associated with type 2 diabetes mellitus (HCC) - Primary (Chronic)    -Last lipid panel: HDL 45, LDL 41 (at goal<70). -Continue current medication regimen. -Will continue to monitor.      Uncontrolled type 2 diabetes mellitus with hyperglycemia (HCC)    -A1c has improved from 7.1 to 6.9, will continue current medication regimen.  Recommend to resume ambulatory glucose monitoring. Will continue to monitor. UA microalbumin collected, normal (patient on ARB therapy).      Relevant Orders   POCT glycosylated hemoglobin (Hb A1C) (Completed)   POCT UA - Microalbumin (Completed)   Other Visit Diagnoses     Pain of left heel       Relevant Medications   predniSONE (DELTASONE) 20 MG tablet   Left hip pain          Left heel pain: -Discussed with patient s/sx suggestive of plantar fasciitis. Will start oral corticosteroid therapy, discussed possible sugar elevations. Recommend conservative therapy, provided handout. If symptoms fail  to improve or worsen recommend referral to orthopedics/podiatry.    Left hip pain: -Discussed with patient s/sx suggestive of hip bursitis. Will start oral corticosteroid therapy. If symptoms fail to improve or worsen recommend referral to orthopedics. Pt verbalized understanding.  Return in about 4 months (around 06/14/2022) for DM, HTN, HLD.        Mayer Masker, PA-C  [facility] [phone] (phone) [phone] (fax)  [facility]

## 2022-02-11 NOTE — Assessment & Plan Note (Addendum)
-  BP elevated on intake. BP repeated with mild improvement. Patient's BP at cardiology visit May 2023 was stable at 130/62 and ambulatory BP readings have been stable as well so will continue current medication regimen. Will reassess BP at f/up visit. Will continue to monitor.

## 2022-02-13 ENCOUNTER — Other Ambulatory Visit: Payer: Self-pay | Admitting: Physician Assistant

## 2022-02-13 DIAGNOSIS — E1165 Type 2 diabetes mellitus with hyperglycemia: Secondary | ICD-10-CM

## 2022-03-10 ENCOUNTER — Other Ambulatory Visit: Payer: Self-pay | Admitting: Physician Assistant

## 2022-04-07 ENCOUNTER — Ambulatory Visit: Payer: Medicare PPO | Attending: Internal Medicine

## 2022-04-07 DIAGNOSIS — Z952 Presence of prosthetic heart valve: Secondary | ICD-10-CM

## 2022-04-07 DIAGNOSIS — Z7901 Long term (current) use of anticoagulants: Secondary | ICD-10-CM

## 2022-04-07 LAB — POCT INR: INR: 3.3 — AB (ref 2.0–3.0)

## 2022-04-07 NOTE — Patient Instructions (Signed)
Continue taking 1 tablet daily except 1.5 tablets each Monday, Wednesday and Friday. Repeat INR in 7 weeks. [facility] [phone]

## 2022-04-14 ENCOUNTER — Other Ambulatory Visit: Payer: Self-pay | Admitting: Internal Medicine

## 2022-04-14 ENCOUNTER — Other Ambulatory Visit: Payer: Self-pay | Admitting: Physician Assistant

## 2022-04-14 DIAGNOSIS — J0140 Acute pansinusitis, unspecified: Secondary | ICD-10-CM

## 2022-04-14 DIAGNOSIS — E1169 Type 2 diabetes mellitus with other specified complication: Secondary | ICD-10-CM

## 2022-05-05 ENCOUNTER — Other Ambulatory Visit: Payer: Self-pay | Admitting: Physician Assistant

## 2022-05-15 ENCOUNTER — Other Ambulatory Visit: Payer: Self-pay | Admitting: Internal Medicine

## 2022-05-15 ENCOUNTER — Other Ambulatory Visit: Payer: Self-pay | Admitting: Physician Assistant

## 2022-05-15 DIAGNOSIS — E1165 Type 2 diabetes mellitus with hyperglycemia: Secondary | ICD-10-CM

## 2022-05-27 ENCOUNTER — Encounter: Payer: Self-pay | Admitting: Internal Medicine

## 2022-05-28 DIAGNOSIS — E7849 Other hyperlipidemia: Secondary | ICD-10-CM | POA: Diagnosis not present

## 2022-05-28 LAB — LIPID PANEL
Chol/HDL Ratio: 2.5 ratio (ref 0.0–4.4)
Cholesterol, Total: 119 mg/dL (ref 100–199)
HDL: 47 mg/dL (ref >39)
LDL Chol Calc (NIH): 50 mg/dL (ref 0–99)
Triglycerides: 126 mg/dL (ref 0–149)
VLDL Cholesterol Cal: 22 mg/dL (ref 5–40)

## 2022-05-30 ENCOUNTER — Ambulatory Visit (INDEPENDENT_AMBULATORY_CARE_PROVIDER_SITE_OTHER): Payer: Medicare PPO

## 2022-05-30 ENCOUNTER — Encounter: Payer: Self-pay | Admitting: Internal Medicine

## 2022-05-30 ENCOUNTER — Ambulatory Visit: Payer: Medicare PPO | Attending: Internal Medicine | Admitting: Internal Medicine

## 2022-05-30 VITALS — BP 136/64 | HR 91 | Ht 63.0 in | Wt 163.6 lb

## 2022-05-30 DIAGNOSIS — Z7901 Long term (current) use of anticoagulants: Secondary | ICD-10-CM | POA: Diagnosis not present

## 2022-05-30 DIAGNOSIS — E7849 Other hyperlipidemia: Secondary | ICD-10-CM | POA: Diagnosis not present

## 2022-05-30 DIAGNOSIS — Z951 Presence of aortocoronary bypass graft: Secondary | ICD-10-CM

## 2022-05-30 DIAGNOSIS — Z952 Presence of prosthetic heart valve: Secondary | ICD-10-CM | POA: Diagnosis not present

## 2022-05-30 DIAGNOSIS — I255 Ischemic cardiomyopathy: Secondary | ICD-10-CM

## 2022-05-30 DIAGNOSIS — I447 Left bundle-branch block, unspecified: Secondary | ICD-10-CM

## 2022-05-30 LAB — POCT INR: INR: 3.3 — AB (ref 2.0–3.0)

## 2022-05-30 NOTE — Progress Notes (Signed)
Chief Complaint:  Follow-up  Primary Care Physician: Lorrene Reid, PA-C  HPI:  Katrina Obrien is a 66 year old female who has a history of an unfortunate episode with left popliteal artery thrombus, PE, severe aortic stenosis which was ultimately diagnosed in a bicuspid aortic valve, as well as multivessel coronary disease, anterior MI, EF about 40% to 45% - all which came to a head about the same time. She underwent 3-vessel bypass as well as mechanical aortic valve replacement. She has done well on Coumadin and was ultimately diagnosed with diabetes and is followed by Dr. Chalmers Cater for this. Now she is markedly improved, although she has an ischemic cardiomyopathy with an EF of 40% to 45%. Her activity level is pretty good.  We recently performed repeat lipid testing, which demonstrated an elevated LDL particle number of 2163. The calculated LDL was 80. I asked her to increase her Crestor to 40 mg at night and add 500 mg Niaspan. She reports significant flushing and intolerance to the Niaspan, despite taking aspirin 30 minutes prior.  Her laboratory work does look improved, with her particle number being reduced to 1547, LDL 55, HDL of 39.  She does report however worsening pain in her capsule when she walks that works its way up her legs. This is nothing as significant as when she was originally diagnosed with her acute arterial thrombus, but has concerns for claudication. Alternatively, this could represent myalgias from her statin.  Katrina Obrien returns today for followup. She is doing extremely well. She continues to be active and has no complaints of chest pain worsening shortness of breath. She does feel like her legs tire easily however this could be improved with more exercise. She denies any symptoms of claudication. Her INR has been therapeutic. She reports good blood sugar control and has an appointment with her endocrinologist in the next couple of weeks. We recently obtained an  echocardiogram which showed normalization of her EF to 55-60% in March of 2015. She's also had lower extremity arterial Dopplers which show preserved ABIs.  I the pleasure seeing Katrina Obrien back in the office today. She reports doing fairly well except occasionally she gets somewhat "swimmy headed". This is worse with change of position. As previously noted her EF has improved back to 55-60%. Recently she's had persistently elevated lipoprotein particles including an LDL particle number in the 1900s. This is despite being on Zetia and Crestor 40 mg. Based on this I recommended that she start on Pralulent. She's been taking this now for just over a month and is having no problems with injection site reactions or side effects. We will plan to check her cholesterol again in about 2 months.  Katrina Obrien was seen in the office today in follow-up. Overall she seems to be doing very well. 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 She will be due for repeat cholesterol test after the first of the year. She denies any chest pain or worsening shortness of breath. She is complaining of some occasional swelling in her lower extremities which improves in the morning. She also gets some pain in her right leg. She is overdue for repeat lower extremity Dopplers and does have a history of course of arterial thrombus in the leg. Warfarin has been therapeutic and managed by the Coumadin clinic. Cholesterol is still not at goal despite max dose therapy. For brief period of time she was on samples of Repatha (PCSK9 inhibitor) with a marked response in cholesterol, however insurance would  not cover this.  12/10/2015  Katrina Obrien returns today for follow-up. She says this is the best she has felt some time. She denies any chest pain or worsening shortness of breath. Recently her cholesterol numbers have risen with LDL now 102. This would put her not at goal therapy. Given her multiple comorbidities. Recent data indicates the fact  that driving LDL below 50 may improve cardiovascular outcomes. This was with the use of maximal statin therapy as well as the addition of a PCSK9 inhibitor, specifically Repatha. In fact, she had responded very well to Pralulent in the past, with LDL as low as 20 however that was not covered by insurance. 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 Aggressive therapy is warranted.  07/15/2016  Katrina Obrien returns today for follow-up. Overall she feels very well. She had one episode of intense burning in her chest which happened a few weeks ago. It was worse when she was laying down at night. She did not take any medicine for that she was concerned she could take it with her other medicines. It sounds like reflux she's not had any more symptoms like that. I told her she could use Tums or an H2 blocker for that as needed. She recently went on to [place] and her LDL cholesterol is now 19. This should be a significant benefit in slowing her overall cardiovascular risks. As mentioned previously she had an echo this past year which showed normal LVEF 55-60% and a normally functioning mechanical aortic valve. Her INR was therapeutic today.  07/09/2017  Katrina Obrien was seen today in follow-up.  She seems to be doing very well.  She reported that she had to shovel snow for several hours with her son and at the end of that did develop some chest tightness/soreness which went away very quickly.  She said she felt like she overworked herself, but generally has had no symptoms.  Recently we repeated a lipid profile which showed marked improvement in her LDL to 28 on statin, ezetimibe and Repatha.  Her triglycerides were elevated at 349, however she mentioned today that this was nonfasting.  Also her blood sugar was elevated recently.  She was started on Jardiance, which I suspect has made a big difference in her blood sugars.  She says  her hemoglobin A1c now is down to 6.1.  Accordingly, her triglycerides have probably improved as well.  Fortunately, her husband was not with her today.  Apparently he is suffering from depression and has had several bouts.  He is not interested in leaving the house and is not as engaged in activities as he has been in the past.  This is obviously causing her a lot of stress and is not clear to her how she can help him.  03/19/2018  Katrina Obrien returns today for follow-up.  She says she is felt today the best she has in years.  Unfortunately, she had a recent colonoscopy which turned out okay however having to come off all of her medications was challenging for her and she was not a fan of bridging Lovenox.  She does report her blood sugars been well controlled.  Her cholesterol is at goal with recent LDL of 58 however it is actually been lower with Repatha.  Her triglycerides are also well controlled at 110.  Her Saint Jude mechanical valve is functioning properly by echo in 2017 which showed normal LVEF.  She denies any  chest pain symptoms with history of three-vessel CABG in 2012.  Blood pressure is at goal today as well.  EKG shows a stable left bundle branch block.  09/16/2018  Katrina Obrien is seen today in follow-up.  She is excited at the fact that she will be having a new grandbaby fairly soon.  Overall she is doing well without new symptoms.  She denies any chest pain or worsening shortness of breath.  Her INR was repeated today was 3.5.  No adjustments were made to her warfarin.  Her mechanical valve is working properly and was last assessed by echo last year.  She has lost some additional weight.  Her blood pressure was elevated little today however she has been under some stress and very recently took her medications.   09/28/2019  Katrina Obrien returns today for follow-up.  Overall she is doing well.  She said she has had both coronavirus vaccines.  She is back teaching in the classroom.  Unfortunately she has  been struggling with some weight gain.  This was due to stress and other issues with her father-in-law who ultimately died and also having to care for her husband.  Her triglycerides were elevated over the summer but LDL remained at goal.  Her A1c was 6.9 likely indicating that diabetes was a factor.  She is now working with a dietitian, exercising more regularly and eating better.  INR was 2.3 today which is slightly below her target of 2.5-3.5.  03/27/2020  Katrina Obrien returns today for follow-up.  Overall she continues to feel well.  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  She denies any chest pain or worsening shortness of breath.  She denies any symptoms of claudication.  Labs have been fairly stable.  Recent LDL remains below 70.  Triglycerides were a little elevated.  She is on Praluent and 40 mg of rosuvastatin.  Hemoglobin A1c was 8.1.  She is on Jardiance and Metformin.  She may need additional treatments if she is not able to reduce that further with diet.  The last echo showed stable valve gradients in December 2019.  10/04/2020  Katrina Obrien is seen today in follow-up.  She underwent a recent echocardiogram which surprisingly showed some decline in LVEF to 45 to 50%.  Is been a small increase in her aortic valve gradient to 16 mmHg.  The aorta is mildly dilated at 39 mm.  She reports that she has had a little bit more fatigue.  She gets short of breath but only with marked exertion.  She does have a left bundle branch block which has been chronic, however I wonder whether this might be contributing to her cardiomyopathy.  She also had COVID-19 despite being vaccinated and boosted just a couple months ago.  She feels like she is recovered from that however I wonder if that could have played a role in this cardiomyopathy as well.  She does have coronary artery disease with prior grafts and she could have some degree of ischemia.  She says she does  get some dyspnea and fatigue but no real anginal symptoms   10/23/2020  Katrina Obrien returns today for follow-up of her Myoview stress test.  I had decreased her Crestor because her LDL was actually quite low.  Surprisingly she reports some improvement in her fatigue.  Perhaps she was having some side effects from the statin.  Showed a possible small area of anteroseptal  ischemia but might be related to her left bundle branch block.  It was calculated as an EF of 28% but actually was more like 45 to 50%.  This is more consistent with her echo findings.  I do think there is some mild cardiomyopathy.  She denies any angina but does have still some fatigue and shortness of breath.  After some discussion today I felt less inclined to pursue cardiac catheterization given the small defect and the fact that she had some improvement in her symptoms off of the Crestor.  I advised actually that we take a 2-week statin holiday off the Crestor but she should continue her PCSK9 inhibitor.  If her symptoms are markedly improved then we will need to consider another option to reach her cholesterol targets.  Also I would advise we switch her ramipril to [facility] but start that after her statin holiday as not to confuse any potential side effects.  01/30/2021  Katrina Obrien is seen today in follow-up.  Unfortunately she has been under a lot of stress.  Her brother just died.  He also probably had FH with bypass surgery not too long after she had it and stents as well.  Apparently had sudden cardiac death and was found about 2 days later after he did not show up to work.  She has been struggling with this.  In addition she had stopped her Crestor because of potential side effects.  She did say that she felt a little better off the Crestor.  That being said her cholesterol has rebounded quite high.  Her total is now 268, HDL 47, LDL 158 and triglycerides 338.  She remains on Praluent.  She seems to be tolerating Entresto which we had  switched her to from an ACE inhibitor.  This is for LVEF 45 to 50%, suspect due to her left bundle branch block with a QRS D of 154 ms.  05/27/2021  Katrina Obrien returns today for follow-up.  She underwent a repeat echocardiogram which shows stable LVEF of 45 to 50%.  After switching her statins, she seems to be tolerating the atorvastatin better.  Lipids are now much improved with total cholesterol 103, HDL 44, triglycerides 120 and LDL 37.  She is also on Praluent.  EKG shows sinus rhythm with PVCs and a left bundle branch block with QRSD of 148 ms.  She denies any worsening shortness of breath.  Level of fatigue is unchanged.  11/27/2021  Katrina Obrien is seen today in follow-up.  Overall she is doing fairly well.  She denies any worsening shortness of breath or chest pain.  Her last echo as mentioned above showed EF 45 to 50% which has been stable on heart failure therapy regimen.  Her lipids have improved and continue to be well treated.  Total cholesterol now 117, triglycerides 195 HDL 45 and LDL 41.  She has gone to [employer] and gets the state teachers plan.  Their preferred PCSK9 inhibitor now is Repatha which she previously was taking and we will work to try to transition her back off of Praluent to this.  05/30/2022  Katrina Obrien is seen today in follow-up.  She continues to do well.  Recently she rescued a dog and has been walking more regularly with the dog.  She said her husband had been improving as well with regards to his depression but unfortunately suffered a stroke.  MRI apparently showed that he had had several strokes.  He has now had some deficit in  his left side which is recovering slowly with rehab.  She continues to work.  She denies any chest pain or shortness of breath or any heart failure symptoms.  INR has been stable.  Her lipids have been well controlled after transitioning back to Repatha from Praluent due to insurance changes.  Total cholesterol now 119, triglycerides 126, HDL 47 and  LDL 50.  PMHx:  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   Clotting disorder (HCC)    anticoagulant - coumadin hx DVT, PE   Coronary artery disease    s/p 3- vessel bypass    Diabetes mellitus without complication (HCC)    DVT (deep venous thrombosis) (HCC)    Heart murmur    Hyperlipidemia    Hypertension    Ischemic cardiomyopathy    40 to 45%   Left bundle branch block    Mechanical heart valve present    Jan 2012   Popliteal artery occlusion, left (HCC)    recent  status  post angioplasty   Pulmonary embolism (HCC)    associated with DVT   Pulmonary embolus ([place])    Thyroid disease    hyperthyroidism    Past Surgical History:  Procedure Laterality Date   AORTIC VALVE REPLACEMENT (AVR)/CORONARY ARTERY BYPASS GRAFTING (CABG)  08/16/2010  VAN TRIGT   AV replacement w/19-mm mechanical St Jude valve ([id number]),CABGX3  lima to LAD, SVG to distal circ,SVG to posterior descending   ASD REPAIR     CARDIAC CATHETERIZATION  08/08/2010   ,LAD 50% PROX,605 TANDEM SEGMENTAL STENOSIS,LEFT CIRC 60%-AV GROOVE, RGT COR 40% PROX ON THE BEND ,80% PROX PDA,   CORONARY ARTERY BYPASS GRAFT  08/16/2010   LIMA TO lad,svg to distal circ ,svg to posterior descending   DOPPLER ECHOCARDIOGRAPHY  08/09/2010   EF35% to 40% ,bbicuspid ;severely  thickened ,severely calcified leaflets, LV normal   LEA doppler  08/07/2010   left popliteal occlusion ,and anterior tibial complete occlusion   NM MYOCAR PERF WALL MOTION  08/06/2010   EF 31%  LOW RISK SCAN   nuc     PV ANGIOGRAM  08/08/2010   successful PTA LEFT POPLITEAL    FAMHx:  Family History  Problem Relation Age of Onset   Cancer Mother    Cancer Father    CAD Father    Heart attack Father 21       first MI, 2 major after that, multiple silent MIs   Stroke Brother    CAD Brother 48       first stent   Heart attack  Brother    CAD Brother 48       CABG    Hypertension Brother    Heart attack Brother 35       died from MI   Colon cancer Neg Hx    Esophageal cancer Neg Hx    Rectal cancer Neg Hx    Stomach cancer Neg Hx     SOCHx:   reports that she has never smoked. She has never used smokeless tobacco. She reports current alcohol use of about 7.0 standard drinks of alcohol per week. She reports that she does not use drugs.  ALLERGIES:  Allergies  Allergen Reactions   Niaspan [Niacin Er] Other (See Comments)    Flushing, even with ASA   Rosuvastatin Other (See Comments)    Fatigue, weakness  ROS: Pertinent items noted in HPI and remainder of comprehensive ROS otherwise negative.  HOME MEDS: Current Outpatient Medications  Medication Sig Dispense Refill   aspirin EC 81 MG tablet Take 81 mg by mouth daily.     atorvastatin (LIPITOR) 40 MG tablet TAKE 1 TABLET (40 MG TOTAL) BY MOUTH DAILY. 90 tablet 3   Evolocumab (REPATHA SURECLICK) 676 MG/ML SOAJ Inject 1 Dose into the skin every 14 (fourteen) days. 6 mL 3   furosemide (LASIX) 40 MG tablet TAKE 1/2 TABLET BY MOUTH DAILY. 45 tablet 2   JARDIANCE 25 MG TABS tablet TAKE 1 TABLET BY MOUTH EVERY DAY 90 tablet 1   levothyroxine (SYNTHROID) 50 MCG tablet TAKE 1 TABLET BY MOUTH DAILY BEFORE BREAKFAST. 90 tablet 0   metFORMIN (GLUCOPHAGE-XR) 500 MG 24 hr tablet TAKE 1 TABLET BY MOUTH IN THE MORNING AND 2 TABLETS AT NIGHT. 270 tablet 1   metoprolol succinate (TOPROL-XL) 100 MG 24 hr tablet TAKE 1 TABLET BY MOUTH DAILY. TAKE WITH OR IMMEDIATELY FOLLOWING A MEAL. 90 tablet 2   OZEMPIC, 0.25 OR 0.5 MG/DOSE, 2 MG/3ML SOPN INJECT 0.5 MG INTO THE SKIN ONCE A WEEK. 9 mL 0   sacubitril-valsartan (ENTRESTO) 24-26 MG TAKE 1 TABLET BY MOUTH 2 TIMES DAILY. 180 tablet 2   VITAMIN D PO Take by mouth.     warfarin (COUMADIN) 2 MG tablet TAKE 1 TO 1 AND 1/2 TABLETS BY MOUTH DAILY AS DIRECTED BY COUMADIN CLINIC. 120 tablet 1   predniSONE (DELTASONE) 20 MG tablet  Take 2 tablets by mouth x 2 days, 1 tablets x 2 days, 0.5 tablet x 2 days (Patient not taking: Reported on 05/30/2022) 7 tablet 0   No current facility-administered medications for this visit.    LABS/IMAGING: Results for orders placed or performed in visit on 05/30/22 (from the past 48 hour(s))  POCT INR     Status: Abnormal   Collection Time: 05/30/22  7:39 AM  Result Value Ref Range   INR 3.3 (A) 2.0 - 3.0   POC INR      No results found.  VITALS: BP 136/64   Pulse 91   Ht '5\' 3"'$  (1.6 m)   Wt 163 lb 9.6 oz (74.2 kg)   LMP  (LMP Unknown)   SpO2 98%   BMI 28.98 kg/m   EXAM: General appearance: alert and no distress Neck: no carotid bruit, no JVD, and thyroid not enlarged, symmetric, no tenderness/mass/nodules Lungs: clear to auscultation bilaterally Heart: S1, S2 normal and ejection click present Abdomen: soft, non-tender; bowel sounds normal; no masses,  no organomegaly Extremities: extremities normal, atraumatic, no cyanosis or edema Pulses: 2+ and symmetric Skin: Skin color, texture, turgor normal. No rashes or lesions Neurologic: Grossly normal Psych: [place]  EKG: Normal sinus rhythm at 91, possible left atrial enlargement, LBBB with QRS D of 150 ms-personally reviewed  ASSESSMENT: Recent fatigue/dyspnea-LVEF 45 to 50% (09/2020) Coronary artery disease status post 3 vessel CABG (LIMA to LAD, SVG to circumflex, SVG to PDA) - 07/2010 Severe aortic stenosis and a bicuspid aortic valve, status post 19 mm St. Jude mechanical aortic valve excellent recent left popliteal artery occlusion with rest pain status post angioplasty (2012) History of pulmonary emboli associated with DVT Ischemic cardiomyopathy, EF now normalized at 55-60% Hypertension - at goal Familial hyperlipidemia - LDL>190 on Repatha Diabetes type 2 - controlled LBBB   PLAN: 1.   Mrs. Baldwin has a stable left bundle branch block.  She denies any worsening heart failure  symptoms.  Her last echo showed  stable LVEF 45 to 50%.  Due to blood pressure at home with readings in the low 100s, I do not think there is room to uptitrate her Entresto.  She is on higher dose Jardiance.  She is on a beta-blocker.  Overall she feels well.  She is physically active and exercises regularly.  I think her left bundle branch block is likely the cause of her degree of cardiomyopathy.  No ischemic symptoms.  Cholesterol is much better controlled and she continues to do well on Repatha.  No changes in her medicines today.  We will plan a repeat echo in 6 months and follow-up at that time.  Pixie Casino, MD, [facility], [place] Director of the Advanced Lipid Disorders &  Cardiovascular Risk Reduction Clinic Attending Cardiologist   Direct Dial: [phone]  Fax: [phone]  Website:  [email or web address] Hawraa [email or web address] 05/30/2022, 8:24 AM

## 2022-05-30 NOTE — Patient Instructions (Signed)
Continue taking 1 tablet daily except 1.5 tablets each Monday, Wednesday and Friday. Repeat INR in 8 weeks. Coumadin Clinic [phone]

## 2022-05-30 NOTE — Patient Instructions (Signed)
Medication Instructions:  NO CHANGES  *If you need a refill on your cardiac medications before your next appointment, please call your pharmacy*   Lab Work: FASTING lab work to check cholesterol in 6 months  If you have labs (blood work) drawn today and your tests are completely normal, you will receive your results only by: IXL (if you have MyChart) OR A paper copy in the mail If you have any lab test that is abnormal or we need to change your treatment, we will call you to review the results.   Testing/Procedures: Your physician has requested that you have an echocardiogram - May 2024. Echocardiography is a painless test that uses sound waves to create images of your heart. It provides your doctor with information about the size and shape of your heart and how well your heart's chambers and valves are working. This procedure takes approximately one hour. There are no restrictions for this procedure. Please do NOT wear cologne, perfume, aftershave, or lotions (deodorant is allowed). Please arrive 15 minutes prior to your appointment time.   Follow-Up: At [facility], you and your health needs are our priority.  As part of our continuing mission to provide you with exceptional heart care, we have created designated Provider Care Teams.  These Care Teams include your primary Cardiologist (physician) and Advanced Practice Providers (APPs -  Physician Assistants and Nurse Practitioners) who all work together to provide you with the care you need, when you need it.  We recommend signing up for the patient portal called "MyChart".  Sign up information is provided on this After Visit Summary.  MyChart is used to connect with patients for Virtual Visits (Telemedicine).  Patients are able to view lab/test results, encounter notes, upcoming appointments, etc.  Non-urgent messages can be sent to your provider as well.   To learn more about what you can do with MyChart, go to  [email or web address].    Your next appointment:   6 month(s)  The format for your next appointment:   In Person  Provider:   Pixie Casino, MD

## 2022-06-15 NOTE — Progress Notes (Unsigned)
Established patient visit   Patient: Katrina Obrien   DOB: 06-Feb-1956   66 y.o. Female  MRN: [id number] Visit Date: 06/16/2022   No chief complaint on file.  Subjective    HPI  Follow up  -DM2 --due for HgbA1c  -HTN historically well managed  -HLD - seeing cardiology - on repatha and lipitor -takes coumadin  -due for mammogram and bone density  -?flu vaccine  -other vaccines should be given at pharmacy    Medications: Outpatient Medications Prior to Visit  Medication Sig   aspirin EC 81 MG tablet Take 81 mg by mouth daily.   atorvastatin (LIPITOR) 40 MG tablet TAKE 1 TABLET (40 MG TOTAL) BY MOUTH DAILY.   Evolocumab (REPATHA SURECLICK) 140 MG/ML SOAJ Inject 1 Dose into the skin every 14 (fourteen) days.   furosemide (LASIX) 40 MG tablet TAKE 1/2 TABLET BY MOUTH DAILY.   JARDIANCE 25 MG TABS tablet TAKE 1 TABLET BY MOUTH EVERY DAY   levothyroxine (SYNTHROID) 50 MCG tablet TAKE 1 TABLET BY MOUTH DAILY BEFORE BREAKFAST.   metFORMIN (GLUCOPHAGE-XR) 500 MG 24 hr tablet TAKE 1 TABLET BY MOUTH IN THE MORNING AND 2 TABLETS AT NIGHT.   metoprolol succinate (TOPROL-XL) 100 MG 24 hr tablet TAKE 1 TABLET BY MOUTH DAILY. TAKE WITH OR IMMEDIATELY FOLLOWING A MEAL.   OZEMPIC, 0.25 OR 0.5 MG/DOSE, 2 MG/3ML SOPN INJECT 0.5 MG INTO THE SKIN ONCE A WEEK.   predniSONE (DELTASONE) 20 MG tablet Take 2 tablets by mouth x 2 days, 1 tablets x 2 days, 0.5 tablet x 2 days (Patient not taking: Reported on 05/30/2022)   sacubitril-valsartan (ENTRESTO) 24-26 MG TAKE 1 TABLET BY MOUTH 2 TIMES DAILY.   VITAMIN D PO Take by mouth.   warfarin (COUMADIN) 2 MG tablet TAKE 1 TO 1 AND 1/2 TABLETS BY MOUTH DAILY AS DIRECTED BY COUMADIN CLINIC.   No facility-administered medications prior to visit.    Review of Systems  Last CBC Lab Results  Component Value Date   WBC 7.4 10/28/2021   HGB 14.2 10/28/2021   HCT 44.0 10/28/2021   MCV 96 10/28/2021   MCH 31.0 10/28/2021   RDW 13.2 10/28/2021   PLT 280  10/28/2021   Last metabolic panel Lab Results  Component Value Date   GLUCOSE 155 (H) 10/28/2021   NA 139 10/28/2021   K 4.5 10/28/2021   CL 103 10/28/2021   CO2 19 (L) 10/28/2021   BUN 23 10/28/2021   CREATININE 0.68 10/28/2021   EGFR 97 10/28/2021   CALCIUM 9.4 10/28/2021   PROT 6.7 10/28/2021   ALBUMIN 4.6 10/28/2021   LABGLOB 2.1 10/28/2021   AGRATIO 2.2 10/28/2021   BILITOT 0.4 10/28/2021   ALKPHOS 64 10/28/2021   AST 27 10/28/2021   ALT 40 (H) 10/28/2021   Last lipids Lab Results  Component Value Date   CHOL 119 05/28/2022   HDL 47 05/28/2022   LDLCALC 50 05/28/2022   TRIG 126 05/28/2022   CHOLHDL 2.5 05/28/2022   Last hemoglobin A1c Lab Results  Component Value Date   HGBA1C 6.9 (A) 02/11/2022   Last thyroid functions Lab Results  Component Value Date   TSH 1.280 10/28/2021       Objective    There were no vitals filed for this visit. There is no height or weight on file to calculate BMI.  BP Readings from Last 3 Encounters:  05/30/22 136/64  02/11/22 (Abnormal) 148/68  11/27/21 130/62    Wt Readings from Last 3  Encounters:  05/30/22 163 lb 9.6 oz (74.2 kg)  02/11/22 164 lb (74.4 kg)  11/27/21 165 lb (74.8 kg)    Physical Exam  ***  No results found for any visits on 06/16/22.  Assessment & Plan     Problem List Items Addressed This Visit   None    No follow-ups on file.         Carlean Jews, NP  [facility] [phone] (phone) [phone] (fax)  [facility]

## 2022-06-16 ENCOUNTER — Other Ambulatory Visit: Payer: Self-pay | Admitting: Internal Medicine

## 2022-06-16 ENCOUNTER — Other Ambulatory Visit: Payer: Self-pay | Admitting: Physician Assistant

## 2022-06-16 ENCOUNTER — Ambulatory Visit: Payer: Medicare PPO | Admitting: Physician Assistant

## 2022-06-16 ENCOUNTER — Ambulatory Visit: Payer: Medicare PPO | Admitting: Nurse Practitioner

## 2022-06-16 ENCOUNTER — Encounter: Payer: Self-pay | Admitting: Nurse Practitioner

## 2022-06-16 VITALS — BP 147/83 | HR 77 | Resp 18 | Ht 63.0 in | Wt 166.0 lb

## 2022-06-16 DIAGNOSIS — I152 Hypertension secondary to endocrine disorders: Secondary | ICD-10-CM

## 2022-06-16 DIAGNOSIS — E1165 Type 2 diabetes mellitus with hyperglycemia: Secondary | ICD-10-CM

## 2022-06-16 DIAGNOSIS — I350 Nonrheumatic aortic (valve) stenosis: Secondary | ICD-10-CM

## 2022-06-16 DIAGNOSIS — M722 Plantar fascial fibromatosis: Secondary | ICD-10-CM | POA: Insufficient documentation

## 2022-06-16 DIAGNOSIS — Z23 Encounter for immunization: Secondary | ICD-10-CM

## 2022-06-16 DIAGNOSIS — E1169 Type 2 diabetes mellitus with other specified complication: Secondary | ICD-10-CM

## 2022-06-16 LAB — POCT GLYCOSYLATED HEMOGLOBIN (HGB A1C): HbA1c POC (<> result, manual entry): 8.3 % (ref 4.0–5.6)

## 2022-06-16 MED ORDER — SEMAGLUTIDE (1 MG/DOSE) 4 MG/3ML ~~LOC~~ SOPN: 1.0000 mg | PEN_INJECTOR | SUBCUTANEOUS | 2 refills | Status: DC

## 2022-07-15 ENCOUNTER — Telehealth: Payer: Self-pay | Admitting: Internal Medicine

## 2022-07-15 NOTE — Telephone Encounter (Signed)
Received PA request for Repatha in CMM - (Key: [id number]) Authorization already on file for this request. Authorization starting on 07/28/2021 and ending on 07/28/2023.

## 2022-07-22 ENCOUNTER — Telehealth: Payer: Medicare PPO | Admitting: Family Medicine

## 2022-07-22 DIAGNOSIS — J069 Acute upper respiratory infection, unspecified: Secondary | ICD-10-CM | POA: Diagnosis not present

## 2022-07-22 MED ORDER — FLUTICASONE PROPIONATE 50 MCG/ACT NA SUSP: 2.0000 | Freq: Every day | NASAL | 0 refills | Status: DC

## 2022-07-22 MED ORDER — BENZONATATE 100 MG PO CAPS: 100.0000 mg | ORAL_CAPSULE | Freq: Two times a day (BID) | ORAL | 0 refills | Status: DC | PRN

## 2022-07-22 NOTE — Progress Notes (Signed)

## 2022-07-25 ENCOUNTER — Ambulatory Visit: Payer: Medicare PPO | Attending: Internal Medicine | Admitting: *Deleted

## 2022-07-25 DIAGNOSIS — Z952 Presence of prosthetic heart valve: Secondary | ICD-10-CM

## 2022-07-25 DIAGNOSIS — Z7901 Long term (current) use of anticoagulants: Secondary | ICD-10-CM | POA: Diagnosis not present

## 2022-07-25 LAB — POCT INR: INR: 3.5 — AB (ref 2.0–3.0)

## 2022-07-25 NOTE — Patient Instructions (Signed)
Continue taking 1 tablet daily except 1.5 tablets each Monday, Wednesday and Friday. Repeat INR in 8 weeks. [facility] [phone]

## 2022-09-15 ENCOUNTER — Other Ambulatory Visit: Payer: Self-pay | Admitting: Nurse Practitioner

## 2022-09-15 DIAGNOSIS — E1165 Type 2 diabetes mellitus with hyperglycemia: Secondary | ICD-10-CM

## 2022-09-16 ENCOUNTER — Encounter: Payer: Self-pay | Admitting: Family Medicine

## 2022-09-16 ENCOUNTER — Ambulatory Visit: Payer: Medicare PPO | Admitting: Family Medicine

## 2022-09-16 VITALS — BP 148/78 | HR 77 | Resp 18 | Ht 63.0 in | Wt 160.0 lb

## 2022-09-16 DIAGNOSIS — E039 Hypothyroidism, unspecified: Secondary | ICD-10-CM | POA: Diagnosis not present

## 2022-09-16 DIAGNOSIS — E1159 Type 2 diabetes mellitus with other circulatory complications: Secondary | ICD-10-CM

## 2022-09-16 DIAGNOSIS — E785 Hyperlipidemia, unspecified: Secondary | ICD-10-CM

## 2022-09-16 DIAGNOSIS — E1165 Type 2 diabetes mellitus with hyperglycemia: Secondary | ICD-10-CM

## 2022-09-16 DIAGNOSIS — E1169 Type 2 diabetes mellitus with other specified complication: Secondary | ICD-10-CM

## 2022-09-16 DIAGNOSIS — I152 Hypertension secondary to endocrine disorders: Secondary | ICD-10-CM

## 2022-09-16 MED ORDER — LEVOTHYROXINE SODIUM 50 MCG PO TABS: 50.0000 ug | ORAL_TABLET | Freq: Every day | ORAL | 0 refills | Status: DC

## 2022-09-16 MED ORDER — SEMAGLUTIDE (1 MG/DOSE) 4 MG/3ML ~~LOC~~ SOPN: 1.0000 mg | PEN_INJECTOR | SUBCUTANEOUS | 2 refills | Status: DC

## 2022-09-16 NOTE — Assessment & Plan Note (Signed)
-  A1c has improved from 7.1 to 6.9,  -Continue current medication regimen unless labs indicate otherwise.  -Encouraged her to get back on track in terms of her diet, she is in agreement and is aware that her eating habits recently have not been conducive to her goals of keeping blood glucose low. -Continue ambulatory glucose monitoring.  -Will continue to monitor.

## 2022-09-16 NOTE — Assessment & Plan Note (Signed)
-  Refilled levothyroxine prescription today. -Will recheck TSH at annual physical appointment

## 2022-09-16 NOTE — Assessment & Plan Note (Signed)
-  BP elevated on intake. BP repeated with mild improvement.  -Continue current medication regimen of lisinopril and Entresto. Will reassess BP at annual physical visit.  -Will continue to monitor.

## 2022-09-16 NOTE — Assessment & Plan Note (Signed)
-  Last lipid panel: HDL 47, LDL 50 (at goal<70). -Continue atorvastatin 40 mg daily. -Will continue to monitor.

## 2022-09-16 NOTE — Progress Notes (Signed)
Established Patient Office Visit  Subjective   Patient ID: Katrina Obrien, female    DOB: 1956-04-27  Age: 67 y.o. MRN: [id number]  Chief Complaint  Patient presents with   Medical Management of Chronic Issues   Diabetes    HPI 67 year old female presents for follow-up of diabetes mellitus, hypertension, hyperlipidemia, hypothyroidism. Diabetes: no hypoglycemic events. No wounds or sores that are not healing well. No increased thirst or urination. Checking glucose at home, fasting values have been in around 150. Taking Jardiance, metformin, semaglutide as prescribed without any side effects.  She is expecting her A1c to potentially be up some.  Since her husband had a stroke last year he has not wanted to go out much, so recently when he started wanting to she has been excited to encourage this and as a result has been eating out more often.  However she is walking a mile in the morning and a mile at night with the dog every day.   HTN: Pt denies chest pain, SOB, dizziness, or heart palpitations.  Taking meds as directed w/o problems.  Denies Lasix, Entresto side effects.   Hyperlipidemia: tolerating atorvastatin well with no myalgias or significant side effects.   ROS Negative unless otherwise noted in HPI   Objective:    BP (!) 148/78 (BP Location: Left Arm, Patient Position: Sitting, Cuff Size: Large)   Pulse 77   Resp 18   Ht 5\' 3"  (1.6 m)   Wt 160 lb (72.6 kg)   LMP  (LMP Unknown)   SpO2 100%   BMI 28.34 kg/m   Physical Exam Constitutional:      General: She is not in acute distress.    Appearance: Normal appearance.  HENT:     Head: Normocephalic and atraumatic.  Cardiovascular:     Rate and Rhythm: Normal rate and regular rhythm.     Pulses: Normal pulses.     Heart sounds: No murmur heard.    No friction rub. No gallop.  Pulmonary:     Effort: Pulmonary effort is normal. No respiratory distress.     Breath sounds: No wheezing, rhonchi or rales.  Skin:     General: Skin is warm and dry.  Neurological:     Mental Status: She is alert and oriented to person, place, and time.     Assessment & Plan:  Uncontrolled type 2 diabetes mellitus with hyperglycemia (HCC) Assessment & Plan: -A1c has improved from 7.1 to 6.9,  -Continue current medication regimen unless labs indicate otherwise.  -Encouraged her to get back on track in terms of her diet, she is in agreement and is aware that her eating habits recently have not been conducive to her goals of keeping blood glucose low. -Continue ambulatory glucose monitoring.  -Will continue to monitor.   Orders: -     Hemoglobin A1c; Future -     Semaglutide (1 MG/DOSE); Inject 1 mg as directed once a week.  Dispense: 3 mL; Refill: 2  Hypertension associated with diabetes (HCC) Assessment & Plan: -BP elevated on intake. BP repeated with mild improvement.  -Continue current medication regimen of lisinopril and Entresto. Will reassess BP at annual physical visit.  -Will continue to monitor.   Hyperlipidemia associated with type 2 diabetes mellitus (HCC) Assessment & Plan: -Last lipid panel: HDL 47, LDL 50 (at goal<70). -Continue atorvastatin 40 mg daily. -Will continue to monitor.   Hypothyroidism, unspecified type Assessment & Plan: -Refilled levothyroxine prescription today. -Will recheck TSH at annual physical  appointment  Orders: -     Levothyroxine Sodium; Take 1 tablet (50 mcg total) by mouth daily before breakfast.  Dispense: 90 tablet; Refill: 0   Return in about 3 months (around 12/15/2022) for annual physical.    Melida Quitter, PA

## 2022-09-17 LAB — HEMOGLOBIN A1C
Est. average glucose Bld gHb Est-mCnc: 163 mg/dL
Hgb A1c MFr Bld: 7.3 % — ABNORMAL HIGH (ref 4.8–5.6)

## 2022-09-19 ENCOUNTER — Ambulatory Visit: Payer: Medicare PPO | Attending: Cardiovascular Disease

## 2022-09-19 DIAGNOSIS — Z7901 Long term (current) use of anticoagulants: Secondary | ICD-10-CM | POA: Diagnosis not present

## 2022-09-19 LAB — POCT INR: INR: 2.8 (ref 2.0–3.0)

## 2022-09-19 NOTE — Patient Instructions (Signed)
Description   Continue taking 1 tablet daily except 1.5 tablets each Monday, Wednesday and Friday. Repeat INR in 8 weeks.  [facility] [phone]

## 2022-09-22 ENCOUNTER — Other Ambulatory Visit: Payer: Self-pay | Admitting: Nurse Practitioner

## 2022-09-22 DIAGNOSIS — E039 Hypothyroidism, unspecified: Secondary | ICD-10-CM

## 2022-10-27 ENCOUNTER — Other Ambulatory Visit: Payer: Self-pay | Admitting: Family Medicine

## 2022-10-27 ENCOUNTER — Encounter: Payer: Self-pay | Admitting: Family Medicine

## 2022-10-27 DIAGNOSIS — Z1211 Encounter for screening for malignant neoplasm of colon: Secondary | ICD-10-CM

## 2022-11-14 ENCOUNTER — Ambulatory Visit: Payer: Medicare PPO | Attending: Cardiology | Admitting: *Deleted

## 2022-11-14 DIAGNOSIS — Z5181 Encounter for therapeutic drug level monitoring: Secondary | ICD-10-CM

## 2022-11-14 DIAGNOSIS — Z7901 Long term (current) use of anticoagulants: Secondary | ICD-10-CM

## 2022-11-14 LAB — POCT INR: POC INR: 3.7

## 2022-11-14 NOTE — Patient Instructions (Signed)
Description   Take 1/2 a tablet of warfarin today and then continue taking 1 tablet daily except 1.5 tablets each Monday, Wednesday and Friday. Repeat INR in 4 weeks.  [facility] [phone]

## 2022-11-18 DIAGNOSIS — Z1212 Encounter for screening for malignant neoplasm of rectum: Secondary | ICD-10-CM | POA: Diagnosis not present

## 2022-11-18 DIAGNOSIS — Z1211 Encounter for screening for malignant neoplasm of colon: Secondary | ICD-10-CM | POA: Diagnosis not present

## 2022-11-27 LAB — COLOGUARD: COLOGUARD: NEGATIVE

## 2022-12-08 ENCOUNTER — Other Ambulatory Visit: Payer: Self-pay | Admitting: Internal Medicine

## 2022-12-08 DIAGNOSIS — E7849 Other hyperlipidemia: Secondary | ICD-10-CM

## 2022-12-08 DIAGNOSIS — Z951 Presence of aortocoronary bypass graft: Secondary | ICD-10-CM

## 2022-12-10 ENCOUNTER — Other Ambulatory Visit: Payer: Self-pay | Admitting: Family Medicine

## 2022-12-10 DIAGNOSIS — Z0000 Encounter for general adult medical examination without abnormal findings: Secondary | ICD-10-CM

## 2022-12-10 DIAGNOSIS — E039 Hypothyroidism, unspecified: Secondary | ICD-10-CM

## 2022-12-10 DIAGNOSIS — E785 Hyperlipidemia, unspecified: Secondary | ICD-10-CM

## 2022-12-10 DIAGNOSIS — E1159 Type 2 diabetes mellitus with other circulatory complications: Secondary | ICD-10-CM

## 2022-12-10 DIAGNOSIS — E559 Vitamin D deficiency, unspecified: Secondary | ICD-10-CM

## 2022-12-10 DIAGNOSIS — E1165 Type 2 diabetes mellitus with hyperglycemia: Secondary | ICD-10-CM

## 2022-12-12 ENCOUNTER — Ambulatory Visit: Payer: Medicare PPO | Attending: Cardiology

## 2022-12-12 DIAGNOSIS — Z7901 Long term (current) use of anticoagulants: Secondary | ICD-10-CM

## 2022-12-12 LAB — POCT INR: INR: 3.6 — AB (ref 2.0–3.0)

## 2022-12-12 NOTE — Patient Instructions (Signed)
continue taking 1 tablet daily except 1.5 tablets each Monday, Wednesday and Friday. Repeat INR in 4 weeks. [facility] [phone]

## 2022-12-15 ENCOUNTER — Other Ambulatory Visit: Payer: Self-pay | Admitting: Internal Medicine

## 2022-12-15 ENCOUNTER — Other Ambulatory Visit: Payer: Self-pay | Admitting: Family Medicine

## 2022-12-15 ENCOUNTER — Other Ambulatory Visit (HOSPITAL_COMMUNITY): Payer: Medicare PPO

## 2022-12-15 DIAGNOSIS — E1165 Type 2 diabetes mellitus with hyperglycemia: Secondary | ICD-10-CM

## 2022-12-15 DIAGNOSIS — E039 Hypothyroidism, unspecified: Secondary | ICD-10-CM

## 2022-12-17 ENCOUNTER — Encounter: Payer: Self-pay | Admitting: Internal Medicine

## 2022-12-23 ENCOUNTER — Ambulatory Visit (HOSPITAL_COMMUNITY): Payer: Medicare PPO | Attending: Internal Medicine

## 2022-12-23 DIAGNOSIS — E7849 Other hyperlipidemia: Secondary | ICD-10-CM

## 2022-12-23 DIAGNOSIS — Z951 Presence of aortocoronary bypass graft: Secondary | ICD-10-CM | POA: Diagnosis not present

## 2022-12-23 DIAGNOSIS — Z952 Presence of prosthetic heart valve: Secondary | ICD-10-CM

## 2022-12-23 LAB — ECHOCARDIOGRAM COMPLETE
AR max vel: 1.29 cm2
AV Area VTI: 1.28 cm2
AV Area mean vel: 1.3 cm2
AV Mean grad: 12 mmHg
AV Peak grad: 21.8 mmHg
Ao pk vel: 2.33 m/s
Area-P 1/2: 4.06 cm2
P 1/2 time: 444 msec
S' Lateral: 3.1 cm

## 2022-12-30 ENCOUNTER — Other Ambulatory Visit: Payer: Medicare PPO

## 2022-12-30 DIAGNOSIS — E785 Hyperlipidemia, unspecified: Secondary | ICD-10-CM | POA: Diagnosis not present

## 2022-12-30 DIAGNOSIS — Z0000 Encounter for general adult medical examination without abnormal findings: Secondary | ICD-10-CM | POA: Diagnosis not present

## 2022-12-30 DIAGNOSIS — E1169 Type 2 diabetes mellitus with other specified complication: Secondary | ICD-10-CM | POA: Diagnosis not present

## 2022-12-30 DIAGNOSIS — E039 Hypothyroidism, unspecified: Secondary | ICD-10-CM | POA: Diagnosis not present

## 2022-12-30 DIAGNOSIS — E1159 Type 2 diabetes mellitus with other circulatory complications: Secondary | ICD-10-CM | POA: Diagnosis not present

## 2022-12-30 DIAGNOSIS — I152 Hypertension secondary to endocrine disorders: Secondary | ICD-10-CM | POA: Diagnosis not present

## 2022-12-30 DIAGNOSIS — E1165 Type 2 diabetes mellitus with hyperglycemia: Secondary | ICD-10-CM | POA: Diagnosis not present

## 2022-12-30 DIAGNOSIS — E559 Vitamin D deficiency, unspecified: Secondary | ICD-10-CM | POA: Diagnosis not present

## 2022-12-31 LAB — CBC WITH DIFFERENTIAL/PLATELET
Basophils Absolute: 0 10*3/uL (ref 0.0–0.2)
Basos: 1 %
EOS (ABSOLUTE): 0.1 10*3/uL (ref 0.0–0.4)
Eos: 2 %
Hematocrit: 39.5 % (ref 34.0–46.6)
Hemoglobin: 13.4 g/dL (ref 11.1–15.9)
Immature Grans (Abs): 0 10*3/uL (ref 0.0–0.1)
Immature Granulocytes: 0 %
Lymphocytes Absolute: 2.3 10*3/uL (ref 0.7–3.1)
Lymphs: 41 %
MCH: 31.2 pg (ref 26.6–33.0)
MCHC: 33.9 g/dL (ref 31.5–35.7)
MCV: 92 fL (ref 79–97)
Monocytes Absolute: 0.4 10*3/uL (ref 0.1–0.9)
Monocytes: 7 %
Neutrophils Absolute: 2.8 10*3/uL (ref 1.4–7.0)
Neutrophils: 49 %
Platelets: 273 10*3/uL (ref 150–450)
RBC: 4.29 x10E6/uL (ref 3.77–5.28)
RDW: 12.7 % (ref 11.7–15.4)
WBC: 5.6 10*3/uL (ref 3.4–10.8)

## 2022-12-31 LAB — COMPREHENSIVE METABOLIC PANEL
ALT: 39 IU/L — ABNORMAL HIGH (ref 0–32)
AST: 26 IU/L (ref 0–40)
Albumin/Globulin Ratio: 2 (ref 1.2–2.2)
Albumin: 4.4 g/dL (ref 3.9–4.9)
Alkaline Phosphatase: 54 IU/L (ref 44–121)
BUN/Creatinine Ratio: 29 — ABNORMAL HIGH (ref 12–28)
BUN: 20 mg/dL (ref 8–27)
Bilirubin Total: 0.5 mg/dL (ref 0.0–1.2)
CO2: 22 mmol/L (ref 20–29)
Calcium: 9.9 mg/dL (ref 8.7–10.3)
Chloride: 103 mmol/L (ref 96–106)
Creatinine, Ser: 0.7 mg/dL (ref 0.57–1.00)
Globulin, Total: 2.2 g/dL (ref 1.5–4.5)
Glucose: 156 mg/dL — ABNORMAL HIGH (ref 70–99)
Potassium: 4.5 mmol/L (ref 3.5–5.2)
Sodium: 140 mmol/L (ref 134–144)
Total Protein: 6.6 g/dL (ref 6.0–8.5)
eGFR: 95 mL/min/{1.73_m2} (ref >59)

## 2022-12-31 LAB — LIPID PANEL
Chol/HDL Ratio: 3.4 ratio (ref 0.0–4.4)
Cholesterol, Total: 137 mg/dL (ref 100–199)
HDL: 40 mg/dL (ref >39)
LDL Chol Calc (NIH): 68 mg/dL (ref 0–99)
Triglycerides: 168 mg/dL — ABNORMAL HIGH (ref 0–149)
VLDL Cholesterol Cal: 29 mg/dL (ref 5–40)

## 2022-12-31 LAB — HEMOGLOBIN A1C
Est. average glucose Bld gHb Est-mCnc: 154 mg/dL
Hgb A1c MFr Bld: 7 % — ABNORMAL HIGH (ref 4.8–5.6)

## 2022-12-31 LAB — TSH: TSH: 1.65 u[IU]/mL (ref 0.450–4.500)

## 2022-12-31 LAB — T4: T4, Total: 7 ug/dL (ref 4.5–12.0)

## 2022-12-31 LAB — T3, FREE: T3, Free: 2.4 pg/mL (ref 2.0–4.4)

## 2022-12-31 LAB — VITAMIN D 25 HYDROXY (VIT D DEFICIENCY, FRACTURES): Vit D, 25-Hydroxy: 33.6 ng/mL (ref 30.0–100.0)

## 2023-01-05 ENCOUNTER — Encounter: Payer: Medicare PPO | Admitting: Family Medicine

## 2023-01-09 ENCOUNTER — Ambulatory Visit: Payer: Medicare PPO | Attending: Internal Medicine

## 2023-01-09 DIAGNOSIS — Z7901 Long term (current) use of anticoagulants: Secondary | ICD-10-CM

## 2023-01-09 LAB — POCT INR: INR: 3.6 — AB (ref 2.0–3.0)

## 2023-01-09 NOTE — Patient Instructions (Signed)
continue taking 1 tablet daily except 1.5 tablets each Monday, Wednesday and Friday. Repeat INR in 6 weeks. [facility] [phone]

## 2023-01-12 ENCOUNTER — Ambulatory Visit (INDEPENDENT_AMBULATORY_CARE_PROVIDER_SITE_OTHER): Payer: Medicare PPO | Admitting: Family Medicine

## 2023-01-12 ENCOUNTER — Encounter: Payer: Self-pay | Admitting: Family Medicine

## 2023-01-12 VITALS — BP 122/77 | HR 65 | Temp 97.8°F | Ht 63.0 in | Wt 164.8 lb

## 2023-01-12 DIAGNOSIS — Z0000 Encounter for general adult medical examination without abnormal findings: Secondary | ICD-10-CM | POA: Diagnosis not present

## 2023-01-12 DIAGNOSIS — E1165 Type 2 diabetes mellitus with hyperglycemia: Secondary | ICD-10-CM | POA: Diagnosis not present

## 2023-01-12 DIAGNOSIS — G2581 Restless legs syndrome: Secondary | ICD-10-CM

## 2023-01-12 DIAGNOSIS — E039 Hypothyroidism, unspecified: Secondary | ICD-10-CM | POA: Diagnosis not present

## 2023-01-12 DIAGNOSIS — E785 Hyperlipidemia, unspecified: Secondary | ICD-10-CM

## 2023-01-12 DIAGNOSIS — I152 Hypertension secondary to endocrine disorders: Secondary | ICD-10-CM

## 2023-01-12 DIAGNOSIS — E1159 Type 2 diabetes mellitus with other circulatory complications: Secondary | ICD-10-CM

## 2023-01-12 DIAGNOSIS — E1169 Type 2 diabetes mellitus with other specified complication: Secondary | ICD-10-CM | POA: Diagnosis not present

## 2023-01-12 MED ORDER — LEVOTHYROXINE SODIUM 50 MCG PO TABS
50.0000 ug | ORAL_TABLET | Freq: Every day | ORAL | 0 refills | Status: DC
Start: 2023-01-12 — End: 2023-05-19

## 2023-01-12 MED ORDER — METFORMIN HCL ER 500 MG PO TB24
1000.0000 mg | ORAL_TABLET | Freq: Every day | ORAL | 1 refills | Status: DC
Start: 2023-01-12 — End: 2023-05-19

## 2023-01-12 MED ORDER — PREGABALIN 75 MG PO CAPS
75.0000 mg | ORAL_CAPSULE | Freq: Every day | ORAL | 0 refills | Status: DC
Start: 2023-01-12 — End: 2023-02-10

## 2023-01-12 MED ORDER — EMPAGLIFLOZIN 25 MG PO TABS
25.0000 mg | ORAL_TABLET | Freq: Every day | ORAL | 1 refills | Status: DC
Start: 2023-01-12 — End: 2023-04-14

## 2023-01-12 MED ORDER — OZEMPIC (1 MG/DOSE) 4 MG/3ML ~~LOC~~ SOPN
PEN_INJECTOR | SUBCUTANEOUS | 2 refills | Status: DC
Start: 2023-01-12 — End: 2023-03-23

## 2023-01-12 NOTE — Assessment & Plan Note (Signed)
Stable.  Continue follow-up with cardiology, furosemide 40 mg daily, metoprolol 100 mg daily, sacubitril-valsartan 24-26 mg daily.

## 2023-01-12 NOTE — Progress Notes (Signed)
Complete physical exam  Patient: Katrina Obrien   DOB: Oct 16, 1955   67 y.o. Female  MRN: [id number]  Subjective:    Chief Complaint  Patient presents with   Annual Exam    Katrina Obrien is a 67 y.o. female who presents today for a complete physical exam. She reports consuming a general diet. She generally feels well. She reports sleeping poorly, she struggles with falling asleep.  She often has an achiness/uncomfortable feeling in her hips and legs and finds it difficult to fall asleep.  She does not have additional problems to discuss today.    Most recent fall risk assessment:    01/12/2023    3:03 PM  Fall Risk   Falls in the past year? 0  Number falls in past yr: 0  Injury with Fall? 0  Risk for fall due to : No Fall Risks  Follow up Falls evaluation completed     Most recent depression and anxiety screenings:    01/12/2023    3:02 PM 09/16/2022    2:28 PM  PHQ 2/9 Scores  PHQ - 2 Score 0 0  PHQ- 9 Score 2       01/12/2023    3:03 PM 06/16/2022    2:03 PM 02/11/2022    1:34 PM 10/10/2021    2:27 PM  GAD 7 : Generalized Anxiety Score  Nervous, Anxious, on Edge 0 0 0 0  Control/stop worrying 0 0 0 0  Worry too much - different things 0 0 0 0  Trouble relaxing 0 0 0 0  Restless 0 0 0 0  Easily annoyed or irritable 0 0 0 0  Afraid - awful might happen 0 0 0 0  Total GAD 7 Score 0 0 0 0  Anxiety Difficulty Not difficult at all Not difficult at all  Not difficult at all    Patient Active Problem List   Diagnosis Date Noted   Plantar fasciitis of right foot 06/16/2022   Uncontrolled type 2 diabetes mellitus with hyperglycemia (HCC) 08/10/2019   Diarrhea due to drug- metformin 06/08/2019   Caregiver stress syndrome 03/08/2019   Vitamin D insufficiency 09/13/2018   White coat syndrome with diagnosis of hypertension 09/13/2018   Hyperlipidemia associated with type 2 diabetes mellitus (HCC) 02/03/2018   Hypothyroidism 02/03/2018   LBBB (left bundle branch block)  04/06/2014   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   Aortic stenosis 01/26/2013   S/P AVR 01/26/2013   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   Controlled type 2 diabetes mellitus without complication (HCC) 01/26/2013   Hypertension associated with diabetes (HCC) 01/26/2013   Familial hyperlipidemia 01/26/2013   Cardiomyopathy, ischemic 01/26/2013   DVT (deep venous thrombosis) (HCC) 01/26/2013   Pulmonary embolus (HCC) 01/26/2013   Long term (current) use of anticoagulants 10/11/2012    Past Surgical History:  Procedure Laterality Date   AORTIC VALVE REPLACEMENT (AVR)/CORONARY ARTERY BYPASS GRAFTING (CABG)  08/16/2010  VAN TRIGT   AV replacement w/19-mm mechanical St Jude valve ([id number]),CABGX3  lima to LAD, SVG to distal circ,SVG to posterior descending   ASD REPAIR     CARDIAC CATHETERIZATION  08/08/2010   ,LAD 50% PROX,605 TANDEM SEGMENTAL STENOSIS,LEFT CIRC 60%-AV GROOVE, RGT COR 40% PROX ON THE BEND ,80% PROX PDA,   CORONARY ARTERY BYPASS GRAFT  08/16/2010   LIMA TO lad,svg to distal circ ,svg to posterior descending   DOPPLER ECHOCARDIOGRAPHY  08/09/2010   EF35% to 40% ,bbicuspid ;severely  thickened ,severely calcified leaflets,  LV normal   LEA doppler  08/07/2010   left popliteal occlusion ,and anterior tibial complete occlusion   NM MYOCAR PERF WALL MOTION  08/06/2010   EF 31%  LOW RISK SCAN   nuc     PV ANGIOGRAM  08/08/2010   successful PTA LEFT POPLITEAL   Social History   Tobacco Use   Smoking status: Never    Passive exposure: Never   Smokeless tobacco: Never  Vaping Use   Vaping Use: Never used  Substance Use Topics   Alcohol use: Yes    Alcohol/week: 7.0 standard drinks of alcohol    Types: 7 Standard drinks or equivalent per week   Drug use: No   Family History  Problem Relation Age of Onset   Cancer Mother    Cancer Father    CAD Father    Heart attack Father 15       first MI, 2 major after that, multiple silent MIs   Stroke Brother    CAD  Brother 43       first stent   Heart attack Brother    CAD Brother 62       CABG    Hypertension Brother    Heart attack Brother 45       died from MI   Colon cancer Neg Hx    Esophageal cancer Neg Hx    Rectal cancer Neg Hx    Stomach cancer Neg Hx    Allergies  Allergen Reactions   Niaspan [Niacin Er] Other (See Comments)    Flushing, even with ASA   Rosuvastatin Other (See Comments)    Fatigue, weakness     Patient Care Team: Melida Quitter, PA as PCP - General (Family Medicine) Rennis Golden Lisette Abu, MD as PCP - Cardiology (Cardiology)   Outpatient Medications Prior to Visit  Medication Sig   aspirin EC 81 MG tablet Take 81 mg by mouth daily.   Evolocumab (REPATHA SURECLICK) 140 MG/ML SOAJ Inject 140 mg into the skin every 14 (fourteen) days.   fluticasone (FLONASE) 50 MCG/ACT nasal spray Place 2 sprays into both nostrils daily.   furosemide (LASIX) 40 MG tablet TAKE 1/2 TABLET BY MOUTH DAILY.   metoprolol succinate (TOPROL-XL) 100 MG 24 hr tablet TAKE 1 TABLET BY MOUTH DAILY. TAKE WITH OR IMMEDIATELY FOLLOWING A MEAL.   sacubitril-valsartan (ENTRESTO) 24-26 MG TAKE 1 TABLET BY MOUTH 2 TIMES DAILY.   VITAMIN D PO Take by mouth.   warfarin (COUMADIN) 2 MG tablet TAKE 1 TO 1 AND 1/2 TABLETS BY MOUTH DAILY AS DIRECTED BY COUMADIN CLINIC.   [DISCONTINUED] JARDIANCE 25 MG TABS tablet TAKE 1 TABLET BY MOUTH EVERY DAY   [DISCONTINUED] levothyroxine (SYNTHROID) 50 MCG tablet TAKE 1 TABLET (50 MCG TOTAL) BY MOUTH DAILY BEFORE BREAKFAST.   [DISCONTINUED] metFORMIN (GLUCOPHAGE-XR) 500 MG 24 hr tablet TAKE 1 TABLET BY MOUTH IN THE MORNING AND 2 TABLETS AT NIGHT.   [DISCONTINUED] OZEMPIC, 1 MG/DOSE, 4 MG/3ML SOPN INJECT 1 MG AS DIRECTED ONCE A WEEK.   atorvastatin (LIPITOR) 40 MG tablet TAKE 1 TABLET (40 MG TOTAL) BY MOUTH DAILY.   No facility-administered medications prior to visit.    Review of Systems  Constitutional:  Negative for chills, fever and malaise/fatigue.  HENT:   Negative for congestion and hearing loss.   Eyes:  Negative for blurred vision and double vision.  Respiratory:  Negative for cough and shortness of breath.   Cardiovascular:  Negative for chest pain, palpitations and leg  swelling.  Gastrointestinal:  Negative for abdominal pain, constipation, diarrhea and heartburn.  Genitourinary:  Negative for frequency and urgency.  Musculoskeletal:  Negative for myalgias and neck pain.  Neurological:  Negative for headaches.  Endo/Heme/Allergies:  Negative for polydipsia.  Psychiatric/Behavioral:  Negative for depression. The patient has insomnia. The patient is not nervous/anxious.       Objective:    BP 122/77   Pulse 65   Temp 97.8 F (36.6 C) (Oral)   Ht 5\' 3"  (1.6 m)   Wt 164 lb 12 oz (74.7 kg)   LMP  (LMP Unknown)   SpO2 99%   BMI 29.18 kg/m    Physical Exam Constitutional:      General: She is not in acute distress.    Appearance: Normal appearance.  HENT:     Head: Normocephalic and atraumatic.     Right Ear: Tympanic membrane, ear canal and external ear normal.     Left Ear: Tympanic membrane, ear canal and external ear normal.     Nose: Nose normal.     Mouth/Throat:     Mouth: Mucous membranes are moist.     Pharynx: No oropharyngeal exudate or posterior oropharyngeal erythema.  Eyes:     Extraocular Movements: Extraocular movements intact.     Pupils: Pupils are equal, round, and reactive to light.  Cardiovascular:     Rate and Rhythm: Normal rate and regular rhythm.     Heart sounds: Normal heart sounds. No murmur heard.    No friction rub. No gallop.  Pulmonary:     Effort: Pulmonary effort is normal.     Breath sounds: Normal breath sounds. No wheezing, rhonchi or rales.  Abdominal:     General: Abdomen is flat. Bowel sounds are normal. There is no distension.     Palpations: There is no mass.     Tenderness: There is no abdominal tenderness.  Musculoskeletal:        General: Normal range of motion.      Cervical back: Normal range of motion and neck supple.  Lymphadenopathy:     Cervical: No cervical adenopathy.  Skin:    General: Skin is warm and dry.  Neurological:     Mental Status: She is alert and oriented to person, place, and time.     Cranial Nerves: No cranial nerve deficit.  Psychiatric:        Mood and Affect: Mood normal.        Assessment & Plan:    Routine Health Maintenance and Physical Exam  Immunization History  Administered Date(s) Administered   Fluad Quad(high Dose 65+) 06/16/2022   Influenza Inj Mdck Quad Pf 03/28/2018, 04/23/2019   Influenza,inj,Quad PF,6+ Mos 05/12/2017, 04/17/2020, 04/04/2021   PFIZER(Purple Top)SARS-COV-2 Vaccination 09/27/2019, 10/18/2019, 05/11/2020   Pneumococcal Conjugate-13 05/11/2018   Tdap 02/08/2018   Zoster Recombinat (Shingrix) 05/11/2018, 10/10/2021    Health Maintenance  Topic Date Due   Medicare Annual Wellness (AWV)  Never done   Pneumonia Vaccine 67+ Years old (2 of 2 - PPSV23 or PCV20) 11/06/2021   DEXA SCAN  Never done   COVID-19 Vaccine (4 - 2023-24 season) 03/28/2022   MAMMOGRAM  04/23/2022   FOOT EXAM  07/10/2022   OPHTHALMOLOGY EXAM  07/12/2022   Colonoscopy  03/10/2023   Diabetic kidney evaluation - Urine ACR  02/12/2023   INFLUENZA VACCINE  02/26/2023   HEMOGLOBIN A1C  07/01/2023   Diabetic kidney evaluation - eGFR measurement  12/30/2023   DTaP/Tdap/Td (2 - Td  or Tdap) 02/09/2028   Hepatitis C Screening  Completed   Zoster Vaccines- Shingrix  Completed   HPV VACCINES  Aged Out   Reviewed recent labs, essentially within normal limits or improved from last. Patient is going to call to schedule her mammogram.  She is also aware that she is due for her PCV 20 pneumonia vaccine and can do that at her local pharmacy.  Discussed health benefits of physical activity, and encouraged her to engage in regular exercise appropriate for her age and condition.  Wellness examination  Hypothyroidism, unspecified  type Assessment & Plan: Stable.  Continue levothyroxine 50 mcg daily.  Orders: -     Levothyroxine Sodium; Take 1 tablet (50 mcg total) by mouth daily before breakfast.  Dispense: 90 tablet; Refill: 0  Uncontrolled type 2 diabetes mellitus with hyperglycemia (HCC) Assessment & Plan: A1c 7.0.  Continue metformin 500 mg every evening, Jardiance 25 mg daily, Ozempic 1 mg weekly.  Will continue to monitor.  Orders: -     Ozempic (1 MG/DOSE); INJECT 1 MG AS DIRECTED ONCE A WEEK.  Dispense: 3 mL; Refill: 2 -     Empagliflozin; Take 1 tablet (25 mg total) by mouth daily.  Dispense: 90 tablet; Refill: 1  Type 2 diabetes mellitus with other specified complication, without long-term current use of insulin (HCC) -     metFORMIN HCl ER; Take 2 tablets (1,000 mg total) by mouth daily.  Dispense: 270 tablet; Refill: 1  Restless leg syndrome -     Pregabalin; Take 1 capsule (75 mg total) by mouth daily. 1 to 3 hours before bedtime  Dispense: 30 capsule; Refill: 0  Hyperlipidemia associated with type 2 diabetes mellitus (HCC) Assessment & Plan: Last lipid panel: LDL 68, HDL 40, triglycerides 168.  Continue atorvastatin 40 mg daily.  Will continue to monitor.   Hypertension associated with diabetes (HCC) Assessment & Plan: Stable.  Continue follow-up with cardiology, furosemide 40 mg daily, metoprolol 100 mg daily, sacubitril-valsartan 24-26 mg daily.   Trial of pregabalin for suspected restless leg syndrome.  If this does not improve symptoms or sleep, may consider sleep medication.  Return in about 4 weeks (around 02/09/2023) for follow-up for starting pregabalin for possible RLS.     Melida Quitter, PA

## 2023-01-12 NOTE — Patient Instructions (Addendum)
I think your elbow pain might be caused by something called medial epicondylitis, AKA "golfer's elbow". I have included some information and stretches to try in addition to conservative care like ice/head and pain relievers.   PHARMACY: -Let them know you need the PCV20 pneumonia vaccine.

## 2023-01-12 NOTE — Assessment & Plan Note (Signed)
Stable.  Continue levothyroxine 50 mcg daily

## 2023-01-12 NOTE — Assessment & Plan Note (Signed)
Last lipid panel: LDL 68, HDL 40, triglycerides 168.  Continue atorvastatin 40 mg daily.  Will continue to monitor.

## 2023-01-12 NOTE — Assessment & Plan Note (Signed)
A1c 7.0.  Continue metformin 500 mg every evening, Jardiance 25 mg daily, Ozempic 1 mg weekly.  Will continue to monitor.

## 2023-01-15 ENCOUNTER — Other Ambulatory Visit: Payer: Self-pay | Admitting: Internal Medicine

## 2023-01-21 ENCOUNTER — Other Ambulatory Visit: Payer: Self-pay

## 2023-01-21 ENCOUNTER — Other Ambulatory Visit: Payer: Self-pay | Admitting: Internal Medicine

## 2023-01-21 MED ORDER — ATORVASTATIN CALCIUM 40 MG PO TABS: 40.0000 mg | ORAL_TABLET | Freq: Every day | ORAL | 0 refills | Status: DC

## 2023-02-03 ENCOUNTER — Ambulatory Visit (INDEPENDENT_AMBULATORY_CARE_PROVIDER_SITE_OTHER): Payer: Medicare PPO

## 2023-02-03 VITALS — Ht 63.0 in | Wt 164.0 lb

## 2023-02-03 DIAGNOSIS — Z13820 Encounter for screening for osteoporosis: Secondary | ICD-10-CM

## 2023-02-03 DIAGNOSIS — Z0000 Encounter for general adult medical examination without abnormal findings: Secondary | ICD-10-CM | POA: Diagnosis not present

## 2023-02-03 DIAGNOSIS — Z1231 Encounter for screening mammogram for malignant neoplasm of breast: Secondary | ICD-10-CM

## 2023-02-03 NOTE — Patient Instructions (Addendum)
Katrina Obrien , Thank you for taking time to come for your Medicare Wellness Visit. I appreciate your ongoing commitment to your health goals. Please review the following plan we discussed and let me know if I can assist you in the future.   These are the goals we discussed:  Goals       Lose weight (pt-stated)        This is a list of the screening recommended for you and due dates:  Health Maintenance  Topic Date Due   Pneumonia Vaccine (2 of 2 - PPSV23 or PCV20) 11/06/2021   DEXA scan (bone density measurement)  Never done   Mammogram  04/23/2022   Complete foot exam   07/10/2022   Eye exam for diabetics  07/12/2022   Yearly kidney health urinalysis for diabetes  02/12/2023   Colon Cancer Screening  03/10/2023   COVID-19 Vaccine (4 - 2023-24 season) 02/19/2023*   Flu Shot  02/26/2023   Hemoglobin A1C  07/01/2023   Yearly kidney function blood test for diabetes  12/30/2023   Medicare Annual Wellness Visit  02/03/2024   DTaP/Tdap/Td vaccine (2 - Td or Tdap) 02/09/2028   Hepatitis C Screening  Completed   Zoster (Shingles) Vaccine  Completed   HPV Vaccine  Aged Out  *Topic was postponed. The date shown is not the original due date.    Advanced directives: In chart  Conditions/risks identified: None  Next appointment: Follow up in one year for your annual wellness visit     Preventive Care 65 Years and Older, Female Preventive care refers to lifestyle choices and visits with your health care provider that can promote health and wellness. What does preventive care include? A yearly physical exam. This is also called an annual well check. Dental exams once or twice a year. Routine eye exams. Ask your health care provider how often you should have your eyes checked. Personal lifestyle choices, including: Daily care of your teeth and gums. Regular physical activity. Eating a healthy diet. Avoiding tobacco and drug use. Limiting alcohol use. Practicing safe sex. Taking  low-dose aspirin every day. Taking vitamin and mineral supplements as recommended by your health care provider. What happens during an annual well check? The services and screenings done by your health care provider during your annual well check will depend on your age, overall health, lifestyle risk factors, and family history of disease. Counseling  Your health care provider may ask you questions about your: Alcohol use. Tobacco use. Drug use. Emotional well-being. Home and relationship well-being. Sexual activity. Eating habits. History of falls. Memory and ability to understand (cognition). Work and work Astronomer. Reproductive health. Screening  You may have the following tests or measurements: Height, weight, and BMI. Blood pressure. Lipid and cholesterol levels. These may be checked every 5 years, or more frequently if you are over 40 years old. Skin check. Lung cancer screening. You may have this screening every year starting at age 29 if you have a 30-pack-year history of smoking and currently smoke or have quit within the past 15 years. Fecal occult blood test (FOBT) of the stool. You may have this test every year starting at age 33. Flexible sigmoidoscopy or colonoscopy. You may have a sigmoidoscopy every 5 years or a colonoscopy every 10 years starting at age 75. Hepatitis C blood test. Hepatitis B blood test. Sexually transmitted disease (STD) testing. Diabetes screening. This is done by checking your blood sugar (glucose) after you have not eaten for a while (fasting).  You may have this done every 1-3 years. Bone density scan. This is done to screen for osteoporosis. You may have this done starting at age 12. Mammogram. This may be done every 1-2 years. Talk to your health care provider about how often you should have regular mammograms. Talk with your health care provider about your test results, treatment options, and if necessary, the need for more tests. Vaccines   Your health care provider may recommend certain vaccines, such as: Influenza vaccine. This is recommended every year. Tetanus, diphtheria, and acellular pertussis (Tdap, Td) vaccine. You may need a Td booster every 10 years. Zoster vaccine. You may need this after age 19. Pneumococcal 13-valent conjugate (PCV13) vaccine. One dose is recommended after age 63. Pneumococcal polysaccharide (PPSV23) vaccine. One dose is recommended after age 32. Talk to your health care provider about which screenings and vaccines you need and how often you need them. This information is not intended to replace advice given to you by your health care provider. Make sure you discuss any questions you have with your health care provider. Document Released: 08/10/2015 Document Revised: 04/02/2016 Document Reviewed: 05/15/2015 Elsevier Interactive Patient Education  2017 [employer].  Fall Prevention in the Home Falls can cause injuries. They can happen to people of all ages. There are many things you can do to make your home safe and to help prevent falls. What can I do on the outside of my home? Regularly fix the edges of walkways and driveways and fix any cracks. Remove anything that might make you trip as you walk through a door, such as a raised step or threshold. Trim any bushes or trees on the path to your home. Use bright outdoor lighting. Clear any walking paths of anything that might make someone trip, such as rocks or tools. Regularly check to see if handrails are loose or broken. Make sure that both sides of any steps have handrails. Any raised decks and porches should have guardrails on the edges. Have any leaves, snow, or ice cleared regularly. Use sand or salt on walking paths during winter. Clean up any spills in your garage right away. This includes oil or grease spills. What can I do in the bathroom? Use night lights. Install grab bars by the toilet and in the tub and shower. Do not use towel  bars as grab bars. Use non-skid mats or decals in the tub or shower. If you need to sit down in the shower, use a plastic, non-slip stool. Keep the floor dry. Clean up any water that spills on the floor as soon as it happens. Remove soap buildup in the tub or shower regularly. Attach bath mats securely with double-sided non-slip rug tape. Do not have throw rugs and other things on the floor that can make you trip. What can I do in the bedroom? Use night lights. Make sure that you have a light by your bed that is easy to reach. Do not use any sheets or blankets that are too big for your bed. They should not hang down onto the floor. Have a firm chair that has side arms. You can use this for support while you get dressed. Do not have throw rugs and other things on the floor that can make you trip. What can I do in the kitchen? Clean up any spills right away. Avoid walking on wet floors. Keep items that you use a lot in easy-to-reach places. If you need to reach something above you, use a  strong step stool that has a grab bar. Keep electrical cords out of the way. Do not use floor polish or wax that makes floors slippery. If you must use wax, use non-skid floor wax. Do not have throw rugs and other things on the floor that can make you trip. What can I do with my stairs? Do not leave any items on the stairs. Make sure that there are handrails on both sides of the stairs and use them. Fix handrails that are broken or loose. Make sure that handrails are as long as the stairways. Check any carpeting to make sure that it is firmly attached to the stairs. Fix any carpet that is loose or worn. Avoid having throw rugs at the top or bottom of the stairs. If you do have throw rugs, attach them to the floor with carpet tape. Make sure that you have a light switch at the top of the stairs and the bottom of the stairs. If you do not have them, ask someone to add them for you. What else can I do to help  prevent falls? Wear shoes that: Do not have high heels. Have rubber bottoms. Are comfortable and fit you well. Are closed at the toe. Do not wear sandals. If you use a stepladder: Make sure that it is fully opened. Do not climb a closed stepladder. Make sure that both sides of the stepladder are locked into place. Ask someone to hold it for you, if possible. Clearly mark and make sure that you can see: Any grab bars or handrails. First and last steps. Where the edge of each step is. Use tools that help you move around (mobility aids) if they are needed. These include: Canes. Walkers. Scooters. Crutches. Turn on the lights when you go into a dark area. Replace any light bulbs as soon as they burn out. Set up your furniture so you have a clear path. Avoid moving your furniture around. If any of your floors are uneven, fix them. If there are any pets around you, be aware of where they are. Review your medicines with your doctor. Some medicines can make you feel dizzy. This can increase your chance of falling. Ask your doctor what other things that you can do to help prevent falls. This information is not intended to replace advice given to you by your health care provider. Make sure you discuss any questions you have with your health care provider. Document Released: 05/10/2009 Document Revised: 12/20/2015 Document Reviewed: 08/18/2014 Elsevier Interactive Patient Education  2017 [employer].

## 2023-02-03 NOTE — Progress Notes (Signed)
Subjective:   Katrina Obrien is a 67 y.o. female who presents for Medicare Annual (Subsequent) preventive examination.  Visit Complete: Virtual  I connected with  Guy Franco on 02/03/23 by a audio enabled telemedicine application and verified that I am speaking with the correct person using two identifiers.  Patient Location: Home  Provider Location: Home Office  I discussed the limitations of evaluation and management by telemedicine. The patient expressed understanding and agreed to proceed.  Patient Medicare AWV questionnaire was completed by the patient on 02/02/23; I have confirmed that all information answered by patient is correct and no changes since this date.  Review of Systems     Cardiac Risk Factors include: advanced age (>46men, >69 women);diabetes mellitus;hypertension     Objective:    Today's Vitals   02/03/23 0934 02/03/23 0935  Weight: 164 lb (74.4 kg)   Height: 5\' 3"  (1.6 m)   PainSc:  0-No pain   Body mass index is 29.05 kg/m.     02/03/2023    9:42 AM 01/12/2023    3:01 PM 08/09/2019    8:02 AM  Advanced Directives  Does Patient Have a Medical Advance Directive? Yes Yes Yes  Type of Estate agent of [place];Living will    Does patient want to make changes to medical advance directive? No - Patient declined  No - Patient declined  Copy of Healthcare Power of Attorney in Chart? Yes - validated most recent copy scanned in chart (See row information)      Current Medications (verified) Outpatient Encounter Medications as of 02/03/2023  Medication Sig   aspirin EC 81 MG tablet Take 81 mg by mouth daily.   atorvastatin (LIPITOR) 40 MG tablet Take 1 tablet (40 mg total) by mouth daily. Please keep July appointment for further refills   empagliflozin (JARDIANCE) 25 MG TABS tablet Take 1 tablet (25 mg total) by mouth daily.   Evolocumab (REPATHA SURECLICK) 140 MG/ML SOAJ Inject 140 mg into the skin every 14 (fourteen) days.    fluticasone (FLONASE) 50 MCG/ACT nasal spray Place 2 sprays into both nostrils daily.   furosemide (LASIX) 40 MG tablet TAKE 1/2 TABLET BY MOUTH DAILY.   levothyroxine (SYNTHROID) 50 MCG tablet Take 1 tablet (50 mcg total) by mouth daily before breakfast.   metFORMIN (GLUCOPHAGE-XR) 500 MG 24 hr tablet Take 2 tablets (1,000 mg total) by mouth daily.   metoprolol succinate (TOPROL-XL) 100 MG 24 hr tablet Take 1 tablet (100 mg total) by mouth daily. TAKE WITH OR IMMEDIATELY FOLLOWING A MEAL.   pregabalin (LYRICA) 75 MG capsule Take 1 capsule (75 mg total) by mouth daily. 1 to 3 hours before bedtime   sacubitril-valsartan (ENTRESTO) 24-26 MG TAKE 1 TABLET BY MOUTH 2 TIMES DAILY.   Semaglutide, 1 MG/DOSE, (OZEMPIC, 1 MG/DOSE,) 4 MG/3ML SOPN INJECT 1 MG AS DIRECTED ONCE A WEEK.   VITAMIN D PO Take by mouth.   warfarin (COUMADIN) 2 MG tablet TAKE 1 TO 1 AND 1/2 TABLETS BY MOUTH DAILY AS DIRECTED BY COUMADIN CLINIC.   No facility-administered encounter medications on file as of 02/03/2023.    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   Clotting disorder (HCC)    anticoagulant - coumadin hx DVT, PE   Coronary artery disease    s/p 3-  vessel bypass    Diabetes mellitus without complication (HCC)    DVT (deep venous thrombosis) (HCC)    Heart murmur    Hyperlipidemia    Hypertension    Ischemic cardiomyopathy    40 to 45%   Left bundle branch block    Mechanical heart valve present    Jan 2012   Popliteal artery occlusion, left (HCC)    recent  status  post angioplasty   Pulmonary embolism (HCC)    associated with DVT   Pulmonary embolus (HCC)    Thyroid disease    hyperthyroidism   Past Surgical History:  Procedure Laterality Date   AORTIC VALVE REPLACEMENT (AVR)/CORONARY ARTERY BYPASS GRAFTING  (CABG)  08/16/2010  VAN TRIGT   AV replacement w/19-mm mechanical St Jude valve ([id number]),CABGX3  lima to LAD, SVG to distal circ,SVG to posterior descending   ASD REPAIR     CARDIAC CATHETERIZATION  08/08/2010   ,LAD 50% PROX,605 TANDEM SEGMENTAL STENOSIS,LEFT CIRC 60%-AV GROOVE, RGT COR 40% PROX ON THE BEND ,80% PROX PDA,   CORONARY ARTERY BYPASS GRAFT  08/16/2010   LIMA TO lad,svg to distal circ ,svg to posterior descending   DOPPLER ECHOCARDIOGRAPHY  08/09/2010   EF35% to 40% ,bbicuspid ;severely  thickened ,severely calcified leaflets, LV normal   LEA doppler  08/07/2010   left popliteal occlusion ,and anterior tibial complete occlusion   NM MYOCAR PERF WALL MOTION  08/06/2010   EF 31%  LOW RISK SCAN   nuc     PV ANGIOGRAM  08/08/2010   successful PTA LEFT POPLITEAL   Family History  Problem Relation Age of Onset   Cancer Mother    Cancer Father    CAD Father    Heart attack Father 61       first MI, 2 major after that, multiple silent MIs   Stroke Brother    CAD Brother 48       first stent   Heart attack Brother    CAD Brother 48       CABG    Hypertension Brother    Heart attack Brother 45       died from MI   Colon cancer Neg Hx    Esophageal cancer Neg Hx    Rectal cancer Neg Hx    Stomach cancer Neg Hx    Social History   Socioeconomic History   Marital status: Married    Spouse name: Not on file   Number of children: Not on file   Years of education: Not on file   Highest education level: Not on file  Occupational History   Not on file  Tobacco Use   Smoking status: Never    Passive exposure: Never   Smokeless tobacco: Never  Vaping Use   Vaping Use: Never used  Substance and Sexual Activity   Alcohol use: Yes    Alcohol/week: 7.0 standard drinks of alcohol    Types: 7 Standard drinks or equivalent per week   Drug use: No   Sexual activity: Not Currently  Other Topics Concern   Not on file  Social History Narrative   Not on file   Social  Determinants of Health   Financial Resource Strain: Low Risk  (02/03/2023)   Overall Financial Resource Strain (CARDIA)    Difficulty of Paying Living Expenses: Not hard at all  Food Insecurity: No Food Insecurity (02/03/2023)   Hunger Vital Sign    Worried About Running Out of Food in the Last  Year: Never true    Ran Out of Food in the Last Year: Never true  Transportation Needs: No Transportation Needs (02/03/2023)   PRAPARE - Administrator, Civil Service (Medical): No    Lack of Transportation (Non-Medical): No  Physical Activity: Sufficiently Active (02/03/2023)   Exercise Vital Sign    Days of Exercise per Week: 7 days    Minutes of Exercise per Session: 30 min  Stress: No Stress Concern Present (02/03/2023)   [employer] of Occupational Health - Occupational Stress Questionnaire    Feeling of Stress : Not at all  Social Connections: Socially Integrated (02/03/2023)   Social Connection and Isolation Panel [NHANES]    Frequency of Communication with Friends and Family: More than three times a week    Frequency of Social Gatherings with Friends and Family: More than three times a week    Attends Religious Services: More than 4 times per year    Active Member of [employer] or Organizations: Yes    Attends Engineer, structural: More than 4 times per year    Marital Status: Married    Tobacco Counseling Counseling given: Not Answered   Clinical Intake:  Pre-visit preparation completed: Yes  Pain : No/denies pain Pain Score: 0-No pain     BMI - recorded: 29.05 Nutritional Status: BMI 25 -29 Overweight Nutritional Risks: None Diabetes: Yes CBG done?: Yes CBG resulted in Enter/ Edit results?: Yes (CBG 150 Taken by patient) Did pt. bring in CBG monitor from home?: No  How often do you need to have someone help you when you read instructions, pamphlets, or other written materials from your doctor or pharmacy?: 1 - Never  Interpreter Needed?: No  Information  entered by :: Theresa Mulligan LPN   Activities of Daily Living    02/03/2023    9:41 AM 02/02/2023    3:53 PM  In your present state of health, do you have any difficulty performing the following activities:  Hearing? 0 0  Vision? 0 0  Difficulty concentrating or making decisions? 0 0  Walking or climbing stairs? 0 0  Dressing or bathing? 0 0  Doing errands, shopping? 0 0  Preparing Food and eating ? N N  Using the Toilet? N N  In the past six months, have you accidently leaked urine? N N  Do you have problems with loss of bowel control? N N  Managing your Medications? N N  Managing your Finances? N N  Housekeeping or managing your Housekeeping? N N    Patient Care Team: Melida Quitter, PA as PCP - General (Family Medicine) Rennis Golden Lisette Abu, MD as PCP - Cardiology (Cardiology)  Indicate any recent Medical Services you may have received from other than [facility] providers in the past year (date may be approximate).     Assessment:   This is a routine wellness examination for Katrina Obrien.  Hearing/Vision screen Hearing Screening - Comments:: Denies hearing difficulties   Vision Screening - Comments:: Wears reading glasses - up to date with routine eye exams with [facility]  Dietary issues and exercise activities discussed:     Goals Addressed               This Visit's Progress     Lose weight (pt-stated)         Depression Screen    02/03/2023    9:40 AM 01/12/2023    3:02 PM 09/16/2022    2:28 PM 06/16/2022    2:03  PM 02/11/2022    1:34 PM 10/10/2021    2:27 PM 07/10/2021    2:21 PM  PHQ 2/9 Scores  PHQ - 2 Score 0 0 0 0 0 0 0  PHQ- 9 Score 0 2  1 1 3 1     Fall Risk    02/03/2023    9:41 AM 02/02/2023    3:53 PM 01/12/2023    3:03 PM 06/16/2022    2:04 PM 02/11/2022    1:35 PM  Fall Risk   Falls in the past year? 0 0 0 0 0  Number falls in past yr: 0 0 0 0   Injury with Fall? 0 0 0 0   Risk for fall due to : No Fall Risks  No Fall Risks  No Fall Risks   Follow up Falls prevention discussed  Falls evaluation completed  Falls evaluation completed    MEDICARE RISK AT HOME:  Medicare Risk at Home - 02/03/23 0950     Any stairs in or around the home? Yes    If so, are there any without handrails? No    Home free of loose throw rugs in walkways, pet beds, electrical cords, etc? Yes    Adequate lighting in your home to reduce risk of falls? Yes    Life alert? No    Use of a cane, walker or w/c? No    Grab bars in the bathroom? Yes    Shower chair or bench in shower? Yes    Elevated toilet seat or a handicapped toilet? Yes             TIMED UP AND GO:  Was the test performed?  No    Cognitive Function:        02/03/2023    9:42 AM  6CIT Screen  What Year? 0 points  What month? 0 points  What time? 0 points  Count back from 20 0 points  Months in reverse 0 points  Repeat phrase 0 points  Total Score 0 points    Immunizations Immunization History  Administered Date(s) Administered   Fluad Quad(high Dose 65+) 06/16/2022   Influenza Inj Mdck Quad Pf 03/28/2018, 04/23/2019   Influenza,inj,Quad PF,6+ Mos 05/12/2017, 04/17/2020, 04/04/2021   PFIZER(Purple Top)SARS-COV-2 Vaccination 09/27/2019, 10/18/2019, 05/11/2020   Pneumococcal Conjugate-13 05/11/2018   Tdap 02/08/2018   Zoster Recombinant(Shingrix) 05/11/2018, 10/10/2021    TDAP status: Up to date  Flu Vaccine status: Up to date  Pneumococcal vaccine status: Due, Education has been provided regarding the importance of this vaccine. Advised may receive this vaccine at local pharmacy or Health Dept. Aware to provide a copy of the vaccination record if obtained from local pharmacy or Health Dept. Verbalized acceptance and understanding.  Covid-19 vaccine status: Completed vaccines  Qualifies for Shingles Vaccine? Yes   Zostavax completed Yes   Shingrix Completed?: Yes  Screening Tests Health Maintenance  Topic Date Due   Pneumonia Vaccine 13+ Years old (2 of 2  - PPSV23 or PCV20) 11/06/2021   DEXA SCAN  Never done   MAMMOGRAM  04/23/2022   FOOT EXAM  07/10/2022   OPHTHALMOLOGY EXAM  07/12/2022   Diabetic kidney evaluation - Urine ACR  02/12/2023   Colonoscopy  03/10/2023   COVID-19 Vaccine (4 - 2023-24 season) 02/19/2023 (Originally 03/28/2022)   INFLUENZA VACCINE  02/26/2023   HEMOGLOBIN A1C  07/01/2023   Diabetic kidney evaluation - eGFR measurement  12/30/2023   Medicare Annual Wellness (AWV)  02/03/2024   DTaP/Tdap/Td (2 -  Td or Tdap) 02/09/2028   Hepatitis C Screening  Completed   Zoster Vaccines- Shingrix  Completed   HPV VACCINES  Aged Out    Health Maintenance  Health Maintenance Due  Topic Date Due   Pneumonia Vaccine 57+ Years old (2 of 2 - PPSV23 or PCV20) 11/06/2021   DEXA SCAN  Never done   MAMMOGRAM  04/23/2022   FOOT EXAM  07/10/2022   OPHTHALMOLOGY EXAM  07/12/2022   Diabetic kidney evaluation - Urine ACR  02/12/2023   Colonoscopy  03/10/2023      Mammogram status: Ordered 02/03/23. Pt provided with contact info and advised to call to schedule appt.   Bone Density status: Ordered 02/03/23. Pt provided with contact info and advised to call to schedule appt.  Lung Cancer Screening: (Low Dose CT Chest recommended if Age 68-80 years, 20 pack-year currently smoking OR have quit w/in 15years.) does not qualify.     Additional Screening:  Hepatitis C Screening: does qualify; Completed 02/08/18  Vision Screening: Recommended annual ophthalmology exams for early detection of glaucoma and other disorders of the eye. Is the patient up to date with their annual eye exam?  Yes  Who is the provider or what is the name of the office in which the patient attends annual eye exams? [facility] If pt is not established with a provider, would they like to be referred to a provider to establish care? No .   Dental Screening: Recommended annual dental exams for proper oral hygiene  Diabetic Foot Exam: Diabetic Foot Exam: Overdue,  Pt has been advised about the importance in completing this exam. Pt is scheduled for diabetic foot exam on Followed by PCP.  Community Resource Referral / Chronic Care Management:  CRR required this visit?  No   CCM required this visit?  No     Plan:     I have personally reviewed and noted the following in the patient's chart:   Medical and social history Use of alcohol, tobacco or illicit drugs  Current medications and supplements including opioid prescriptions. Patient is not currently taking opioid prescriptions. Functional ability and status Nutritional status Physical activity Advanced directives List of other physicians Hospitalizations, surgeries, and ER visits in previous 12 months Vitals Screenings to include cognitive, depression, and falls Referrals and appointments  In addition, I have reviewed and discussed with patient certain preventive protocols, quality metrics, and best practice recommendations. A written personalized care plan for preventive services as well as general preventive health recommendations were provided to patient.     Tillie Rung, LPN   0/07/270   After Visit Summary: (MyChart) Due to this being a telephonic visit, the after visit summary with patients personalized plan was offered to patient via MyChart   Nurse Notes: None

## 2023-02-09 ENCOUNTER — Other Ambulatory Visit: Payer: Self-pay | Admitting: Internal Medicine

## 2023-02-10 ENCOUNTER — Other Ambulatory Visit: Payer: Self-pay | Admitting: Family Medicine

## 2023-02-10 DIAGNOSIS — G2581 Restless legs syndrome: Secondary | ICD-10-CM

## 2023-02-16 ENCOUNTER — Ambulatory Visit (INDEPENDENT_AMBULATORY_CARE_PROVIDER_SITE_OTHER): Payer: Medicare PPO | Admitting: Family Medicine

## 2023-02-16 ENCOUNTER — Encounter: Payer: Self-pay | Admitting: Family Medicine

## 2023-02-16 VITALS — BP 132/76 | HR 69 | Resp 18 | Ht 63.0 in | Wt 167.0 lb

## 2023-02-16 DIAGNOSIS — G2581 Restless legs syndrome: Secondary | ICD-10-CM

## 2023-02-16 MED ORDER — PREGABALIN 75 MG PO CAPS
75.0000 mg | ORAL_CAPSULE | Freq: Every day | ORAL | 1 refills | Status: DC
Start: 2023-02-16 — End: 2023-06-04

## 2023-02-16 NOTE — Progress Notes (Signed)
Established Patient Office Visit  Subjective   Patient ID: Katrina Obrien, female    DOB: 11-Nov-1955  Age: 67 y.o. MRN: [id number]  Chief Complaint  Patient presents with   Medication Refill    HPI ELSE Katrina Obrien is a 67 y.o. female presenting today for follow up of RLS after starting pregabalin. At last appointment, she complained of achiness/uncomfortable feeling in her hips and legs making it difficult to fall asleep. Started pregabalin 75 mg daily at that time. It has greatly improved symptoms. She still wakes up a few times at night with night sweats, but generally gets 7-8 hours of sleep. She did have an episode of arm cramps and then leg cramps all on the left side. Both resolved, but she wanted to note it.  ROS Negative unless otherwise noted in HPI   Objective:     BP 132/76 (BP Location: Left Arm, Patient Position: Sitting, Cuff Size: Normal)   Pulse 69   Resp 18   Ht 5\' 3"  (1.6 m)   Wt 167 lb (75.8 kg)   LMP  (LMP Unknown)   SpO2 98%   BMI 29.58 kg/m   Physical Exam Constitutional:      General: She is not in acute distress.    Appearance: Normal appearance.  HENT:     Head: Normocephalic and atraumatic.  Cardiovascular:     Rate and Rhythm: Normal rate and regular rhythm.     Heart sounds: No murmur heard.    No friction rub. No gallop.  Pulmonary:     Effort: Pulmonary effort is normal. No respiratory distress.     Breath sounds: No wheezing, rhonchi or rales.  Skin:    General: Skin is warm and dry.  Neurological:     Mental Status: She is alert and oriented to person, place, and time.      Assessment & Plan:  Restless leg syndrome Assessment & Plan: Improved. Continue pregabalin 75 mg 1-3 hours before bedtime. May increase to 150 mg daily if needed. Will continue to monitor.  Orders: -     Pregabalin; Take 1 capsule (75 mg total) by mouth daily. Take 1-3 hours before bedtime. Can increase to 150 mg daily.  Dispense: 90 capsule; Refill:  1    Return in about 3 months (around 05/19/2023) for follow-up for HTN, HLD, DM, fasting blood work 1 week before.   Melida Quitter, PA

## 2023-02-16 NOTE — Assessment & Plan Note (Signed)
Improved. Continue pregabalin 75 mg 1-3 hours before bedtime. May increase to 150 mg daily if needed. Will continue to monitor.

## 2023-02-18 ENCOUNTER — Other Ambulatory Visit: Payer: Self-pay | Admitting: Internal Medicine

## 2023-02-18 ENCOUNTER — Ambulatory Visit (INDEPENDENT_AMBULATORY_CARE_PROVIDER_SITE_OTHER): Payer: Medicare PPO

## 2023-02-18 ENCOUNTER — Ambulatory Visit: Payer: Medicare PPO | Attending: Internal Medicine | Admitting: Internal Medicine

## 2023-02-18 ENCOUNTER — Encounter: Payer: Self-pay | Admitting: Internal Medicine

## 2023-02-18 VITALS — BP 124/74 | HR 73 | Ht 63.0 in | Wt 166.2 lb

## 2023-02-18 DIAGNOSIS — I255 Ischemic cardiomyopathy: Secondary | ICD-10-CM

## 2023-02-18 DIAGNOSIS — Z952 Presence of prosthetic heart valve: Secondary | ICD-10-CM | POA: Diagnosis not present

## 2023-02-18 DIAGNOSIS — E7849 Other hyperlipidemia: Secondary | ICD-10-CM

## 2023-02-18 DIAGNOSIS — Z7901 Long term (current) use of anticoagulants: Secondary | ICD-10-CM | POA: Diagnosis not present

## 2023-02-18 DIAGNOSIS — E1159 Type 2 diabetes mellitus with other circulatory complications: Secondary | ICD-10-CM

## 2023-02-18 DIAGNOSIS — Z951 Presence of aortocoronary bypass graft: Secondary | ICD-10-CM | POA: Diagnosis not present

## 2023-02-18 DIAGNOSIS — I447 Left bundle-branch block, unspecified: Secondary | ICD-10-CM | POA: Diagnosis not present

## 2023-02-18 DIAGNOSIS — I152 Hypertension secondary to endocrine disorders: Secondary | ICD-10-CM

## 2023-02-18 LAB — POCT INR: INR: 3 (ref 2.0–3.0)

## 2023-02-18 NOTE — Patient Instructions (Signed)
Medication Instructions:   Your physician recommends that you continue on your current medications as directed. Please refer to the Current Medication list given to you today.   *If you need a refill on your cardiac medications before your next appointment, please call your pharmacy*   Lab Work:  None ordered.  If you have labs (blood work) drawn today and your tests are completely normal, you will receive your results only by: MyChart Message (if you have MyChart) OR A paper copy in the mail If you have any lab test that is abnormal or we need to change your treatment, we will call you to review the results.   Testing/Procedures: Due 1 year with follow up after.  Your physician has requested that you have an echocardiogram. Echocardiography is a painless test that uses sound waves to create images of your heart. It provides your doctor with information about the size and shape of your heart and how well your heart's chambers and valves are working. This procedure takes approximately one hour. There are no restrictions for this procedure. Please do NOT wear cologne, perfume  or lotions (deodorant is allowed). Please arrive 15 minutes prior to your appointment time.    Follow-Up: At [facility], you and your health needs are our priority.  As part of our continuing mission to provide you with exceptional heart care, we have created designated Provider Care Teams.  These Care Teams include your primary Cardiologist (physician) and Advanced Practice Providers (APPs -  Physician Assistants and Nurse Practitioners) who all work together to provide you with the care you need, when you need it.  We recommend signing up for the patient portal called "MyChart".  Sign up information is provided on this After Visit Summary.  MyChart is used to connect with patients for Virtual Visits (Telemedicine).  Patients are able to view lab/test results, encounter notes, upcoming appointments, etc.   Non-urgent messages can be sent to your provider as well.   To learn more about what you can do with MyChart, go to [email or web address].    Your next appointment:   1 year(s)  Provider:   Chrystie Nose, MD     Other Instructions  Your physician wants you to follow-up in: 1 year with echo prior.  You will receive a reminder letter in the mail two months in advance. If you don't receive a letter, please call our office to schedule the follow-up appointment.

## 2023-02-18 NOTE — Progress Notes (Signed)
Chief Complaint:  Follow-up  Primary Care Physician: Katrina Quitter, PA  HPI:  Katrina Obrien is a 67 year old female who has a history of an unfortunate episode with left popliteal artery thrombus, PE, severe aortic stenosis which was ultimately diagnosed in a bicuspid aortic valve, as well as multivessel coronary disease, anterior MI, EF about 40% to 45% - all which came to a head about the same time. She underwent 3-vessel bypass as well as mechanical aortic valve replacement. She has done well on Coumadin and was ultimately diagnosed with diabetes and is followed by Dr. Talmage Obrien for this. Now she is markedly improved, although she has an ischemic cardiomyopathy with an EF of 40% to 45%. Her activity level is pretty good.  We recently performed repeat lipid testing, which demonstrated an elevated LDL particle number of 2163. The calculated LDL was 80. I asked her to increase her Crestor to 40 mg at night and add 500 mg Niaspan. She reports significant flushing and intolerance to the Niaspan, despite taking aspirin 30 minutes prior.  Her laboratory work does look improved, with her particle number being reduced to 1547, LDL 55, HDL of 39.  She does report however worsening pain in her capsule when she walks that works its way up her legs. This is nothing as significant as when she was originally diagnosed with her acute arterial thrombus, but has concerns for claudication. Alternatively, this could represent myalgias from her statin.  Katrina Obrien returns today for followup. She is doing extremely well. She continues to be active and has no complaints of chest pain worsening shortness of breath. She does feel like her legs tire easily however this could be improved with more exercise. She denies any symptoms of claudication. Her INR has been therapeutic. She reports good blood sugar control and has an appointment with her endocrinologist in the next couple of weeks. We recently obtained an  echocardiogram which showed normalization of her EF to 55-60% in March of 2015. She's also had lower extremity arterial Dopplers which show preserved ABIs.  I the pleasure seeing Katrina Obrien back in the office today. She reports doing fairly well except occasionally she gets somewhat "swimmy headed". This is worse with change of position. As previously noted her EF has improved back to 55-60%. Recently she's had persistently elevated lipoprotein particles including an LDL particle number in the 1900s. This is despite being on Zetia and Crestor 40 mg. Based on this I recommended that she start on Pralulent. She's been taking this now for just over a month and is having no problems with injection site reactions or side effects. We will plan to check her cholesterol again in about 2 months.  Katrina Obrien was seen in the office today in follow-up. Overall she seems to be doing very well. 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 She will be due for repeat cholesterol test after the first of the year. She denies any chest pain or worsening shortness of breath. She is complaining of some occasional swelling in her lower extremities which improves in the morning. She also gets some pain in her right leg. She is overdue for repeat lower extremity Dopplers and does have a history of course of arterial thrombus in the leg. Warfarin has been therapeutic and managed by the Coumadin clinic. Cholesterol is still not at goal despite max dose therapy. For brief period of time she was on samples of Repatha (PCSK9 inhibitor) with a marked response in cholesterol, however insurance  would not cover this.  12/10/2015  Katrina Obrien returns today for follow-up. She says this is the best she has felt some time. She denies any chest pain or worsening shortness of breath. Recently her cholesterol numbers have risen with LDL now 102. This would put her not at goal therapy. Given her multiple comorbidities. Recent data indicates the fact  that driving LDL below 50 may improve cardiovascular outcomes. This was with the use of maximal statin therapy as well as the addition of a PCSK9 inhibitor, specifically Repatha. In fact, she had responded very well to Pralulent in the past, with LDL as low as 20 however that was not covered by insurance. 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 Aggressive therapy is warranted.  07/15/2016  Katrina Obrien returns today for follow-up. Overall she feels very well. She had one episode of intense burning in her chest which happened a few weeks ago. It was worse when she was laying down at night. She did not take any medicine for that she was concerned she could take it with her other medicines. It sounds like reflux she's not had any more symptoms like that. I told her she could use Tums or an H2 blocker for that as needed. She recently went on to Repatha and her LDL cholesterol is now 19. This should be a significant benefit in slowing her overall cardiovascular risks. As mentioned previously she had an echo this past year which showed normal LVEF 55-60% and a normally functioning mechanical aortic valve. Her INR was therapeutic today.  07/09/2017  Katrina Obrien was seen today in follow-up.  She seems to be doing very well.  She reported that she had to shovel snow for several hours with her son and at the end of that did develop some chest tightness/soreness which went away very quickly.  She said she felt like she overworked herself, but generally has had no symptoms.  Recently we repeated a lipid profile which showed marked improvement in her LDL to 28 on statin, ezetimibe and Repatha.  Her triglycerides were elevated at 349, however she mentioned today that this was nonfasting.  Also her blood sugar was elevated recently.  She was started on Jardiance, which I suspect has made a big difference in her blood sugars.  She says  her hemoglobin A1c now is down to 6.1.  Accordingly, her triglycerides have probably improved as well.  Fortunately, her husband was not with her today.  Apparently he is suffering from depression and has had several bouts.  He is not interested in leaving the house and is not as engaged in activities as he has been in the past.  This is obviously causing her a lot of stress and is not clear to her how she can help him.  03/19/2018  Katrina Obrien returns today for follow-up.  She says she is felt today the best she has in years.  Unfortunately, she had a recent colonoscopy which turned out okay however having to come off all of her medications was challenging for her and she was not a fan of bridging Lovenox.  She does report her blood sugars been well controlled.  Her cholesterol is at goal with recent LDL of 58 however it is actually been lower with Repatha.  Her triglycerides are also well controlled at 110.  Her Saint Jude mechanical valve is functioning properly by echo in 2017 which showed normal LVEF.  She denies  any chest pain symptoms with history of three-vessel CABG in 2012.  Blood pressure is at goal today as well.  EKG shows a stable left bundle branch block.  09/16/2018  Katrina Obrien is seen today in follow-up.  She is excited at the fact that she will be having a new grandbaby fairly soon.  Overall she is doing well without new symptoms.  She denies any chest pain or worsening shortness of breath.  Her INR was repeated today was 3.5.  No adjustments were made to her warfarin.  Her mechanical valve is working properly and was last assessed by echo last year.  She has lost some additional weight.  Her blood pressure was elevated little today however she has been under some stress and very recently took her medications.   09/28/2019  Katrina Obrien returns today for follow-up.  Overall she is doing well.  She said she has had both coronavirus vaccines.  She is back teaching in the classroom.  Unfortunately she has  been struggling with some weight gain.  This was due to stress and other issues with her father-in-law who ultimately died and also having to care for her husband.  Her triglycerides were elevated over the summer but LDL remained at goal.  Her A1c was 6.9 likely indicating that diabetes was a factor.  She is now working with a dietitian, exercising more regularly and eating better.  INR was 2.3 today which is slightly below her target of 2.5-3.5.  03/27/2020  Katrina Obrien returns today for follow-up.  Overall she continues to feel well.  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  She denies any chest pain or worsening shortness of breath.  She denies any symptoms of claudication.  Labs have been fairly stable.  Recent LDL remains below 70.  Triglycerides were a little elevated.  She is on Praluent and 40 mg of rosuvastatin.  Hemoglobin A1c was 8.1.  She is on Jardiance and Metformin.  She may need additional treatments if she is not able to reduce that further with diet.  The last echo showed stable valve gradients in December 2019.  10/04/2020  Katrina Obrien is seen today in follow-up.  She underwent a recent echocardiogram which surprisingly showed some decline in LVEF to 45 to 50%.  Is been a small increase in her aortic valve gradient to 16 mmHg.  The aorta is mildly dilated at 39 mm.  She reports that she has had a little bit more fatigue.  She gets short of breath but only with marked exertion.  She does have a left bundle branch block which has been chronic, however I wonder whether this might be contributing to her cardiomyopathy.  She also had COVID-19 despite being vaccinated and boosted just a couple months ago.  She feels like she is recovered from that however I wonder if that could have played a role in this cardiomyopathy as well.  She does have coronary artery disease with prior grafts and she could have some degree of ischemia.  She says she does  get some dyspnea and fatigue but no real anginal symptoms   10/23/2020  Katrina Obrien returns today for follow-up of her Myoview stress test.  I had decreased her Crestor because her LDL was actually quite low.  Surprisingly she reports some improvement in her fatigue.  Perhaps she was having some side effects from the statin.  Showed a possible small area of  anteroseptal ischemia but might be related to her left bundle branch block.  It was calculated as an EF of 28% but actually was more like 45 to 50%.  This is more consistent with her echo findings.  I do think there is some mild cardiomyopathy.  She denies any angina but does have still some fatigue and shortness of breath.  After some discussion today I felt less inclined to pursue cardiac catheterization given the small defect and the fact that she had some improvement in her symptoms off of the Crestor.  I advised actually that we take a 2-week statin holiday off the Crestor but she should continue her PCSK9 inhibitor.  If her symptoms are markedly improved then we will need to consider another option to reach her cholesterol targets.  Also I would advise we switch her ramipril to [facility] but start that after her statin holiday as not to confuse any potential side effects.  01/30/2021  Katrina Obrien is seen today in follow-up.  Unfortunately she has been under a lot of stress.  Her brother just died.  He also probably had FH with bypass surgery not too long after she had it and stents as well.  Apparently had sudden cardiac death and was found about 2 days later after he did not show up to work.  She has been struggling with this.  In addition she had stopped her Crestor because of potential side effects.  She did say that she felt a little better off the Crestor.  That being said her cholesterol has rebounded quite high.  Her total is now 268, HDL 47, LDL 158 and triglycerides 440.  She remains on Praluent.  She seems to be tolerating Entresto which we had  switched her to from an ACE inhibitor.  This is for LVEF 45 to 50%, suspect due to her left bundle branch block with a QRS D of 154 ms.  05/27/2021  Katrina Obrien returns today for follow-up.  She underwent a repeat echocardiogram which shows stable LVEF of 45 to 50%.  After switching her statins, she seems to be tolerating the atorvastatin better.  Lipids are now much improved with total cholesterol 103, HDL 44, triglycerides 120 and LDL 37.  She is also on Praluent.  EKG shows sinus rhythm with PVCs and a left bundle branch block with QRSD of 148 ms.  She denies any worsening shortness of breath.  Level of fatigue is unchanged.  11/27/2021  Katrina Obrien is seen today in follow-up.  Overall she is doing fairly well.  She denies any worsening shortness of breath or chest pain.  Her last echo as mentioned above showed EF 45 to 50% which has been stable on heart failure therapy regimen.  Her lipids have improved and continue to be well treated.  Total cholesterol now 117, triglycerides 195 HDL 45 and LDL 41.  She has gone to [employer] and gets the state teachers plan.  Their preferred PCSK9 inhibitor now is Repatha which she previously was taking and we will work to try to transition her back off of Praluent to this.  05/30/2022  Katrina Obrien is seen today in follow-up.  She continues to do well.  Recently she rescued a dog and has been walking more regularly with the dog.  She said her husband had been improving as well with regards to his depression but unfortunately suffered a stroke.  MRI apparently showed that he had had several strokes.  He has now had some deficit  in his left side which is recovering slowly with rehab.  She continues to work.  She denies any chest pain or shortness of breath or any heart failure symptoms.  INR has been stable.  Her lipids have been well controlled after transitioning back to Repatha from Praluent due to insurance changes.  Total cholesterol now 119, triglycerides 126, HDL 47 and  LDL 50.  02/18/2023  Katrina Obrien returns today for follow-up.  She is doing fairly well.  She says she has been physically active.  She is mowing her lawn and has no limitations with that with a self-propelled mower.  She had a recent echo which does show improvement in LVEF now up to 50 to 55%.  Her mechanical aortic valve gradients are normal.  Her INR has been stable.  She says she has been eating out a little bit more.  Cholesterol a month ago showed total 137, triglycerides 168, HDL 40 and LDL 68 which is an increased somewhat over her earlier cholesterol numbers and I suspect may be mostly dietary.  Blood pressure is well-controlled today.  PMHx:  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   Clotting disorder (HCC)    anticoagulant - coumadin hx DVT, PE   Coronary artery disease    s/p 3- vessel bypass    Diabetes mellitus without complication (HCC)    DVT (deep venous thrombosis) (HCC)    Heart murmur    Hyperlipidemia    Hypertension    Ischemic cardiomyopathy    40 to 45%   Left bundle branch block    Mechanical heart valve present    Jan 2012   Popliteal artery occlusion, left (HCC)    recent  status  post angioplasty   Pulmonary embolism (HCC)    associated with DVT   Pulmonary embolus (HCC)    Thyroid disease    hyperthyroidism    Past Surgical History:  Procedure Laterality Date   AORTIC VALVE REPLACEMENT (AVR)/CORONARY ARTERY BYPASS GRAFTING (CABG)  08/16/2010  VAN TRIGT   AV replacement w/19-mm mechanical St Jude valve ([id number]),CABGX3  lima to LAD, SVG to distal circ,SVG to posterior descending   ASD REPAIR     CARDIAC CATHETERIZATION  08/08/2010   ,LAD 50% PROX,605 TANDEM SEGMENTAL STENOSIS,LEFT CIRC 60%-AV GROOVE, RGT COR 40% PROX ON THE BEND ,80% PROX PDA,   CORONARY ARTERY BYPASS GRAFT  08/16/2010   LIMA TO lad,svg to distal circ ,svg to  posterior descending   DOPPLER ECHOCARDIOGRAPHY  08/09/2010   EF35% to 40% ,bbicuspid ;severely  thickened ,severely calcified leaflets, LV normal   LEA doppler  08/07/2010   left popliteal occlusion ,and anterior tibial complete occlusion   NM MYOCAR PERF WALL MOTION  08/06/2010   EF 31%  LOW RISK SCAN   nuc     PV ANGIOGRAM  08/08/2010   successful PTA LEFT POPLITEAL    FAMHx:  Family History  Problem Relation Age of Onset   Cancer Mother    Cancer Father    CAD Father    Heart attack Father 5       first MI, 2 major after that, multiple silent MIs   Stroke Brother    CAD Brother 48       first stent   Heart attack Brother    CAD Brother 32       CABG  Hypertension Brother    Heart attack Brother 45       died from MI   Colon cancer Neg Hx    Esophageal cancer Neg Hx    Rectal cancer Neg Hx    Stomach cancer Neg Hx     SOCHx:   reports that she has never smoked. She has never been exposed to tobacco smoke. She has never used smokeless tobacco. She reports current alcohol use of about 7.0 standard drinks of alcohol per week. She reports that she does not use drugs.  ALLERGIES:  Allergies  Allergen Reactions   Niaspan [Niacin Er] Other (See Comments)    Flushing, even with ASA   Rosuvastatin Other (See Comments)    Fatigue, weakness    ROS: Pertinent items noted in HPI and remainder of comprehensive ROS otherwise negative.  HOME MEDS: Current Outpatient Medications  Medication Sig Dispense Refill   aspirin EC 81 MG tablet Take 81 mg by mouth daily.     atorvastatin (LIPITOR) 40 MG tablet Take 1 tablet (40 mg total) by mouth daily. Please keep July appointment for further refills 90 tablet 0   empagliflozin (JARDIANCE) 25 MG TABS tablet Take 1 tablet (25 mg total) by mouth daily. 90 tablet 1   Evolocumab (REPATHA SURECLICK) 140 MG/ML SOAJ Inject 140 mg into the skin every 14 (fourteen) days. 6 mL 3   fluticasone (FLONASE) 50 MCG/ACT nasal spray Place 2  sprays into both nostrils daily. 16 g 0   furosemide (LASIX) 40 MG tablet TAKE 1/2 TABLET BY MOUTH DAILY. 45 tablet 2   levothyroxine (SYNTHROID) 50 MCG tablet Take 1 tablet (50 mcg total) by mouth daily before breakfast. 90 tablet 0   metFORMIN (GLUCOPHAGE-XR) 500 MG 24 hr tablet Take 2 tablets (1,000 mg total) by mouth daily. 270 tablet 1   metoprolol succinate (TOPROL-XL) 100 MG 24 hr tablet Take 1 tablet (100 mg total) by mouth daily. TAKE WITH OR IMMEDIATELY FOLLOWING A MEAL. 30 tablet 0   pregabalin (LYRICA) 75 MG capsule Take 1 capsule (75 mg total) by mouth daily. Take 1-3 hours before bedtime. Can increase to 150 mg daily. 90 capsule 1   sacubitril-valsartan (ENTRESTO) 24-26 MG TAKE 1 TABLET BY MOUTH TWICE A DAY 60 tablet 0   Semaglutide, 1 MG/DOSE, (OZEMPIC, 1 MG/DOSE,) 4 MG/3ML SOPN INJECT 1 MG AS DIRECTED ONCE A WEEK. 3 mL 2   VITAMIN D PO Take by mouth.     warfarin (COUMADIN) 2 MG tablet TAKE 1 TO 1 AND 1/2 TABLETS BY MOUTH DAILY AS DIRECTED BY COUMADIN CLINIC. 120 tablet 1   No current facility-administered medications for this visit.    LABS/IMAGING: No results found for this or any previous visit (from the past 48 hour(s)).   No results found.  VITALS: BP 124/74   Pulse 73   Ht 5\' 3"  (1.6 m)   Wt 166 lb 3.2 oz (75.4 kg)   LMP  (LMP Unknown)   SpO2 97%   BMI 29.44 kg/m   EXAM: General appearance: alert and no distress Neck: no carotid bruit, no JVD, and thyroid not enlarged, symmetric, no tenderness/mass/nodules Lungs: clear to auscultation bilaterally Heart: S1, S2 normal and ejection click present Abdomen: soft, non-tender; bowel sounds normal; no masses,  no organomegaly Extremities: extremities normal, atraumatic, no cyanosis or edema Pulses: 2+ and symmetric Skin: Skin color, texture, turgor normal. No rashes or lesions Neurologic: Grossly normal Psych: Pleasant  EKG: EKG Interpretation Date/Time:  Wednesday February 18 2023  08:13:43 EDT Ventricular Rate:   73 PR Interval:  152 QRS Duration:  146 QT Interval:  426 QTC Calculation: 469 R Axis:   -33  Text Interpretation: Normal sinus rhythm Left axis deviation Left bundle branch block When compared with ECG of 17-Aug-2010 07:34, Compared to previous tracing there is no significant change Confirmed by Zoila Shutter [phone]) on 02/18/2023 8:38:56 AM    ASSESSMENT: Ischemic cardiomyopathy-LVEF 45 to 50% (09/2020) -> 50-55% (11/2022) Coronary artery disease status post 3 vessel CABG (LIMA to LAD, SVG to circumflex, SVG to PDA) - 07/2010 Severe aortic stenosis and a bicuspid aortic valve, status post 19 mm St. Jude mechanical aortic valve excellent recent left popliteal artery occlusion with rest pain status post angioplasty (2012) History of pulmonary emboli associated with DVT Hypertension - at goal Familial hyperlipidemia - LDL>190 on Repatha Diabetes type 2 - controlled LBBB   PLAN: 1.   Katrina Obrien seems to be doing well.  Her most recent echo showed improvement in LVEF up to 50 to 55%.  She is on low-dose Entresto with good blood pressure control.  Her aortic valve gradients are stable.  Her INRs have been stable.  No change in left bundle branch block.  I suspect the left bundle branch block is playing a role in her cardiomyopathy.  Cholesterol remains well-controlled although is climbing somewhat.  She will need to mind her diet especially since she has been eating out more and continue to be physically active.  No changes to her meds today.  Follow-up with me annually or sooner as necessary.  Will repeat an echo at that time.  Chrystie Nose, MD, [facility], FACP  [place]  [facility]  Medical Director of the [facility] Lipid [facility] &  Cardiovascular Risk Reduction Clinic Attending Cardiologist   Direct Dial: [phone]  Fax: [phone]  Website:  [email or web address] Nicely Kobyn Kray 02/18/2023, 8:23 AM

## 2023-02-18 NOTE — Patient Instructions (Signed)
Description   Continue taking 1 tablet daily except 1.5 tablets each Monday, Wednesday and Friday.  Repeat INR in 6 weeks. Coumadin Clinic [phone]

## 2023-03-04 ENCOUNTER — Ambulatory Visit
Admission: RE | Admit: 2023-03-04 | Discharge: 2023-03-04 | Disposition: A | Discharge status: Home / Self Care | Payer: Medicare PPO | Source: Ambulatory Visit | Attending: Family Medicine | Admitting: Family Medicine

## 2023-03-04 DIAGNOSIS — Z1231 Encounter for screening mammogram for malignant neoplasm of breast: Secondary | ICD-10-CM | POA: Diagnosis not present

## 2023-03-09 ENCOUNTER — Encounter: Payer: Self-pay | Admitting: Family Medicine

## 2023-03-09 DIAGNOSIS — H2513 Age-related nuclear cataract, bilateral: Secondary | ICD-10-CM | POA: Diagnosis not present

## 2023-03-09 DIAGNOSIS — H524 Presbyopia: Secondary | ICD-10-CM | POA: Diagnosis not present

## 2023-03-09 LAB — HM DIABETES EYE EXAM

## 2023-03-14 ENCOUNTER — Other Ambulatory Visit: Payer: Self-pay | Admitting: Internal Medicine

## 2023-03-23 ENCOUNTER — Other Ambulatory Visit: Payer: Self-pay | Admitting: Family Medicine

## 2023-03-23 ENCOUNTER — Other Ambulatory Visit: Payer: Self-pay | Admitting: Internal Medicine

## 2023-03-23 DIAGNOSIS — E1165 Type 2 diabetes mellitus with hyperglycemia: Secondary | ICD-10-CM

## 2023-04-07 ENCOUNTER — Ambulatory Visit: Payer: Medicare PPO | Attending: Cardiology

## 2023-04-07 DIAGNOSIS — Z7901 Long term (current) use of anticoagulants: Secondary | ICD-10-CM

## 2023-04-07 LAB — POCT INR: INR: 4 — AB (ref 2.0–3.0)

## 2023-04-07 NOTE — Patient Instructions (Signed)
HOLD TONIGHT ONLY THEN Continue taking 1 tablet daily except 1.5 tablets each Monday, Wednesday and Friday.  Repeat INR in 6 weeks. Coumadin Clinic [phone]

## 2023-04-13 ENCOUNTER — Other Ambulatory Visit: Payer: Self-pay | Admitting: Internal Medicine

## 2023-04-14 ENCOUNTER — Other Ambulatory Visit: Payer: Self-pay | Admitting: Family Medicine

## 2023-04-14 DIAGNOSIS — E1165 Type 2 diabetes mellitus with hyperglycemia: Secondary | ICD-10-CM

## 2023-04-18 ENCOUNTER — Other Ambulatory Visit: Payer: Self-pay | Admitting: Internal Medicine

## 2023-05-07 ENCOUNTER — Other Ambulatory Visit: Payer: Self-pay | Admitting: Family Medicine

## 2023-05-07 DIAGNOSIS — E1159 Type 2 diabetes mellitus with other circulatory complications: Secondary | ICD-10-CM

## 2023-05-07 DIAGNOSIS — I152 Hypertension secondary to endocrine disorders: Secondary | ICD-10-CM

## 2023-05-07 DIAGNOSIS — E1169 Type 2 diabetes mellitus with other specified complication: Secondary | ICD-10-CM

## 2023-05-12 ENCOUNTER — Other Ambulatory Visit: Payer: Medicare PPO

## 2023-05-12 DIAGNOSIS — E785 Hyperlipidemia, unspecified: Secondary | ICD-10-CM | POA: Diagnosis not present

## 2023-05-12 DIAGNOSIS — E1159 Type 2 diabetes mellitus with other circulatory complications: Secondary | ICD-10-CM | POA: Diagnosis not present

## 2023-05-12 DIAGNOSIS — E1169 Type 2 diabetes mellitus with other specified complication: Secondary | ICD-10-CM | POA: Diagnosis not present

## 2023-05-12 DIAGNOSIS — I152 Hypertension secondary to endocrine disorders: Secondary | ICD-10-CM | POA: Diagnosis not present

## 2023-05-13 LAB — COMPREHENSIVE METABOLIC PANEL
ALT: 32 [IU]/L (ref 0–32)
AST: 21 [IU]/L (ref 0–40)
Albumin: 4.4 g/dL (ref 3.9–4.9)
Alkaline Phosphatase: 61 [IU]/L (ref 44–121)
BUN/Creatinine Ratio: 34 — ABNORMAL HIGH (ref 12–28)
BUN: 23 mg/dL (ref 8–27)
Bilirubin Total: 0.4 mg/dL (ref 0.0–1.2)
CO2: 19 mmol/L — ABNORMAL LOW (ref 20–29)
Calcium: 9.1 mg/dL (ref 8.7–10.3)
Chloride: 104 mmol/L (ref 96–106)
Creatinine, Ser: 0.68 mg/dL (ref 0.57–1.00)
Globulin, Total: 2.2 g/dL (ref 1.5–4.5)
Glucose: 142 mg/dL — ABNORMAL HIGH (ref 70–99)
Potassium: 4.3 mmol/L (ref 3.5–5.2)
Sodium: 137 mmol/L (ref 134–144)
Total Protein: 6.6 g/dL (ref 6.0–8.5)
eGFR: 96 mL/min/{1.73_m2} (ref >59)

## 2023-05-13 LAB — LIPID PANEL
Chol/HDL Ratio: 3.3 {ratio} (ref 0.0–4.4)
Cholesterol, Total: 133 mg/dL (ref 100–199)
HDL: 40 mg/dL (ref >39)
LDL Chol Calc (NIH): 59 mg/dL (ref 0–99)
Triglycerides: 211 mg/dL — ABNORMAL HIGH (ref 0–149)
VLDL Cholesterol Cal: 34 mg/dL (ref 5–40)

## 2023-05-13 LAB — HEMOGLOBIN A1C
Est. average glucose Bld gHb Est-mCnc: 157 mg/dL
Hgb A1c MFr Bld: 7.1 % — ABNORMAL HIGH (ref 4.8–5.6)

## 2023-05-19 ENCOUNTER — Ambulatory Visit (INDEPENDENT_AMBULATORY_CARE_PROVIDER_SITE_OTHER): Payer: Medicare PPO | Admitting: Family Medicine

## 2023-05-19 ENCOUNTER — Ambulatory Visit: Payer: Medicare PPO | Attending: Internal Medicine | Admitting: *Deleted

## 2023-05-19 ENCOUNTER — Encounter: Payer: Self-pay | Admitting: Family Medicine

## 2023-05-19 VITALS — BP 118/60 | HR 82 | Resp 18 | Ht 63.0 in | Wt 167.0 lb

## 2023-05-19 DIAGNOSIS — Z7901 Long term (current) use of anticoagulants: Secondary | ICD-10-CM | POA: Diagnosis not present

## 2023-05-19 DIAGNOSIS — E785 Hyperlipidemia, unspecified: Secondary | ICD-10-CM | POA: Diagnosis not present

## 2023-05-19 DIAGNOSIS — Z7984 Long term (current) use of oral hypoglycemic drugs: Secondary | ICD-10-CM

## 2023-05-19 DIAGNOSIS — Z952 Presence of prosthetic heart valve: Secondary | ICD-10-CM | POA: Diagnosis not present

## 2023-05-19 DIAGNOSIS — M25531 Pain in right wrist: Secondary | ICD-10-CM

## 2023-05-19 DIAGNOSIS — E039 Hypothyroidism, unspecified: Secondary | ICD-10-CM

## 2023-05-19 DIAGNOSIS — I2699 Other pulmonary embolism without acute cor pulmonale: Secondary | ICD-10-CM | POA: Diagnosis not present

## 2023-05-19 DIAGNOSIS — R944 Abnormal results of kidney function studies: Secondary | ICD-10-CM

## 2023-05-19 DIAGNOSIS — E1165 Type 2 diabetes mellitus with hyperglycemia: Secondary | ICD-10-CM | POA: Diagnosis not present

## 2023-05-19 DIAGNOSIS — E1169 Type 2 diabetes mellitus with other specified complication: Secondary | ICD-10-CM

## 2023-05-19 DIAGNOSIS — E1159 Type 2 diabetes mellitus with other circulatory complications: Secondary | ICD-10-CM

## 2023-05-19 DIAGNOSIS — M25511 Pain in right shoulder: Secondary | ICD-10-CM | POA: Diagnosis not present

## 2023-05-19 DIAGNOSIS — I152 Hypertension secondary to endocrine disorders: Secondary | ICD-10-CM

## 2023-05-19 LAB — POCT INR: INR: 3.3 — AB (ref 2.0–3.0)

## 2023-05-19 LAB — POCT UA - MICROALBUMIN
Creatinine, POC: 50 mg/dL
Microalbumin Ur, POC: 30 mg/L

## 2023-05-19 MED ORDER — EMPAGLIFLOZIN 25 MG PO TABS: 25.0000 mg | ORAL_TABLET | Freq: Every day | ORAL | 0 refills | Status: DC

## 2023-05-19 MED ORDER — OZEMPIC (2 MG/DOSE) 8 MG/3ML ~~LOC~~ SOPN: 2.0000 mg | PEN_INJECTOR | SUBCUTANEOUS | 2 refills | Status: DC

## 2023-05-19 MED ORDER — LEVOTHYROXINE SODIUM 50 MCG PO TABS: 50.0000 ug | ORAL_TABLET | Freq: Every day | ORAL | 0 refills | Status: DC

## 2023-05-19 NOTE — Patient Instructions (Signed)
Description   Continue taking warfarin 1 tablet daily except 1.5 tablets each Monday, Wednesday and Friday. Repeat INR in 7 weeks. Coumadin Clinic [phone]

## 2023-05-19 NOTE — Progress Notes (Signed)
Established Patient Office Visit  Subjective   Patient ID: Katrina Obrien, female    DOB: 09/30/1955  Age: 67 y.o. MRN: [id number]  Chief Complaint  Patient presents with   Diabetes   Hyperlipidemia   Hypertension    HPI Katrina Obrien is a 67 y.o. female presenting today for follow up of hypertension, hyperlipidemia, diabetes.  She also notes that she was on a walk with her dog and fell about 6 weeks ago.  Immediately, she developed right shoulder pain, right wrist pain, and ankle pain.  The ankle pain has since resolved, but she continues to have right shoulder pain that radiates down her right arm in addition to right wrist pain.  She was not evaluated initially and has not had any imaging.  She used ice, heat, and Tylenol at home with some improvement but not resolution. Hypertension: Patient here for follow-up of elevated blood pressure. She is exercising and is adherent to low salt diet.   Pt denies chest pain, SOB, dizziness, edema, syncope, fatigue or heart palpitations. Taking furosemide, metoprolol, and sacubitril-valsartan as prescribed by cardiology, reports excellent compliance with treatment. Denies side effects. Hyperlipidemia: tolerating atorvastatin 40 mg daily well with no myalgias or significant side effects.  The 10-year ASCVD risk score (Arnett DK, et al., 2019) is: 12.3% Diabetes: denies hypoglycemic events, wounds or sores that are not healing well, increased thirst or urination. Denies vision problems, eye exam up-to-date. Taking metformin, Jardiance, Ozempic as prescribed without any side effects.   Outpatient Medications Prior to Visit  Medication Sig   aspirin EC 81 MG tablet Take 81 mg by mouth daily.   atorvastatin (LIPITOR) 40 MG tablet TAKE 1 TABLET (40 MG TOTAL) BY MOUTH DAILY. PLEASE KEEP JULY APPOINTMENT FOR FURTHER REFILLS   Evolocumab (REPATHA SURECLICK) 140 MG/ML SOAJ Inject 140 mg into the skin every 14 (fourteen) days.   fluticasone (FLONASE) 50  MCG/ACT nasal spray Place 2 sprays into both nostrils daily.   furosemide (LASIX) 40 MG tablet TAKE 1/2 TABLET BY MOUTH DAILY.   metoprolol succinate (TOPROL-XL) 100 MG 24 hr tablet TAKE 1 TABLET BY MOUTH DAILY. TAKE WITH OR IMMEDIATELY FOLLOWING A MEAL.   pregabalin (LYRICA) 75 MG capsule Take 1 capsule (75 mg total) by mouth daily. Take 1-3 hours before bedtime. Can increase to 150 mg daily.   sacubitril-valsartan (ENTRESTO) 24-26 MG TAKE 1 TABLET BY MOUTH TWICE A DAY   VITAMIN D PO Take by mouth.   warfarin (COUMADIN) 2 MG tablet TAKE 1 TO 1 AND 1/2 TABLETS BY MOUTH DAILY AS DIRECTED BY COUMADIN CLINIC.   [DISCONTINUED] empagliflozin (JARDIANCE) 25 MG TABS tablet TAKE 1 TABLET (25 MG TOTAL) BY MOUTH DAILY.   [DISCONTINUED] levothyroxine (SYNTHROID) 50 MCG tablet Take 1 tablet (50 mcg total) by mouth daily before breakfast.   [DISCONTINUED] metFORMIN (GLUCOPHAGE-XR) 500 MG 24 hr tablet Take 2 tablets (1,000 mg total) by mouth daily.   [DISCONTINUED] OZEMPIC, 1 MG/DOSE, 4 MG/3ML SOPN INJECT 1 MG AS DIRECTED ONCE A WEEK.   No facility-administered medications prior to visit.    ROS Negative unless otherwise noted in HPI   Objective:     BP 118/60 Comment: home bp  Pulse 82   Resp 18   Ht 5\' 3"  (1.6 m)   Wt 167 lb (75.8 kg)   LMP  (LMP Unknown)   SpO2 99%   BMI 29.58 kg/m   Physical Exam Constitutional:      General: She is not in acute  distress.    Appearance: Normal appearance.  HENT:     Head: Normocephalic and atraumatic.  Cardiovascular:     Rate and Rhythm: Normal rate and regular rhythm.     Heart sounds: No murmur heard.    No friction rub. No gallop.  Pulmonary:     Effort: Pulmonary effort is normal. No respiratory distress.     Breath sounds: No wheezing, rhonchi or rales.  Skin:    General: Skin is warm and dry.  Neurological:     Mental Status: She is alert and oriented to person, place, and time.    Diabetic Foot Exam - Simple   Simple Foot  Form Diabetic Foot exam was performed with the following findings: Yes 05/19/2023  4:23 PM  Visual Inspection No deformities, no ulcerations, no other skin breakdown bilaterally: Yes Sensation Testing Intact to touch and monofilament testing bilaterally: Yes Pulse Check Posterior Tibialis and Dorsalis pulse intact bilaterally: Yes Comments    Results for orders placed or performed in visit on 05/19/23  POCT UA - Microalbumin  Result Value Ref Range   Microalbumin Ur, POC 30 mg/L   Creatinine, POC 50 mg/dL   Albumin/Creatinine Ratio, Urine, POC 30-300   Results for orders placed or performed in visit on 05/19/23  POCT INR  Result Value Ref Range   INR 3.3 (A) 2.0 - 3.0   POC INR       Assessment & Plan:  Hypertension associated with diabetes (HCC) Assessment & Plan: BP goal <130/80. Stable.  Continue follow-up with cardiology, furosemide 40 mg daily, metoprolol 100 mg daily, sacubitril-valsartan 24-26 mg daily. Rechecking CMP in 2 weeks for BUN/Cr and electrolytes.    Hyperlipidemia associated with type 2 diabetes mellitus (HCC) Assessment & Plan: Last lipid panel: LDL 59, HDL 40, triglycerides 211.  Continue atorvastatin 40 mg daily and recheck lipid panel in 3 months.   Uncontrolled type 2 diabetes mellitus with hyperglycemia (HCC) Assessment & Plan: A1c 7.1.  Stop metformin, increase Ozempic to 2 mg weekly, continue Jardiance 25 mg daily.  Will continue to monitor.  Stopping metformin with hopes of improving diarrhea and therefore improving BUN/creatinine ratio.  Recheck CMP in 2 weeks.  Orders: -     POCT UA - Microalbumin -     Ozempic (2 MG/DOSE); Inject 2 mg into the skin once a week.  Dispense: 3 mL; Refill: 2 -     Empagliflozin; Take 1 tablet (25 mg total) by mouth daily.  Dispense: 90 tablet; Refill: 0  Hypothyroidism, unspecified type -     Levothyroxine Sodium; Take 1 tablet (50 mcg total) by mouth daily before breakfast.  Dispense: 90 tablet; Refill:  0  Renal function test abnormal -     Urinalysis, Complete; Future -     Comprehensive metabolic panel; Future  Right wrist pain -     DG Wrist Complete Right -     Ambulatory referral to Orthopedic Surgery  Acute pain of right shoulder -     DG Shoulder Right -     Ambulatory referral to Orthopedic Surgery  Encourage patient to increase hydration to correct potential hypovolemia induced elevation of BUN/creatinine ratio.  Also stopping metformin to reduce fluid loss through diarrhea.  Recheck CMP and obtain urinalysis in 2 weeks.  Starting workup with x-rays of right shoulder and right wrist, placing referral to orthopedics as radiating pain may involve nerves and requires further workup.  Sending elevated INR to cardiology for adjustment of warfarin dosing.  Return in about 2 weeks (around 06/02/2023) for repeat labs and urine sample; 4 months for follow up HTN, HLD, DM, fasting labs 1 week before.    Melida Quitter, PA

## 2023-05-19 NOTE — Assessment & Plan Note (Signed)
Last lipid panel: LDL 59, HDL 40, triglycerides 211.  Continue atorvastatin 40 mg daily and recheck lipid panel in 3 months.

## 2023-05-19 NOTE — Assessment & Plan Note (Signed)
BP goal <130/80. Stable.  Continue follow-up with cardiology, furosemide 40 mg daily, metoprolol 100 mg daily, sacubitril-valsartan 24-26 mg daily. Rechecking CMP in 2 weeks for BUN/Cr and electrolytes.

## 2023-05-19 NOTE — Assessment & Plan Note (Addendum)
A1c 7.1.  Stop metformin, increase Ozempic to 2 mg weekly, continue Jardiance 25 mg daily.  Will continue to monitor.  Stopping metformin with hopes of improving diarrhea and therefore improving BUN/creatinine ratio.  Recheck CMP in 2 weeks. Elevated albumin/creatinine ratio likely secondary to hyperglycemia, recheck in 3 months.  Foot exam completed today.

## 2023-05-19 NOTE — Patient Instructions (Addendum)
Get in touch with your cardiologist and let them know that your IRN is higher than it should be.  In 2 weeks, come back for a blood draw and a urine sample.

## 2023-05-20 ENCOUNTER — Ambulatory Visit
Admission: RE | Admit: 2023-05-20 | Discharge: 2023-05-20 | Disposition: A | Discharge status: Home / Self Care | Payer: Medicare PPO | Source: Ambulatory Visit | Attending: Family Medicine

## 2023-05-20 DIAGNOSIS — M25531 Pain in right wrist: Secondary | ICD-10-CM | POA: Diagnosis not present

## 2023-05-20 DIAGNOSIS — M25511 Pain in right shoulder: Secondary | ICD-10-CM | POA: Diagnosis not present

## 2023-06-01 ENCOUNTER — Encounter: Payer: Self-pay | Admitting: Physician Assistant

## 2023-06-01 ENCOUNTER — Other Ambulatory Visit: Payer: Self-pay | Admitting: Radiology

## 2023-06-01 ENCOUNTER — Ambulatory Visit: Payer: Medicare PPO | Admitting: Physician Assistant

## 2023-06-01 DIAGNOSIS — M5412 Radiculopathy, cervical region: Secondary | ICD-10-CM

## 2023-06-01 DIAGNOSIS — M7711 Lateral epicondylitis, right elbow: Secondary | ICD-10-CM | POA: Diagnosis not present

## 2023-06-01 DIAGNOSIS — M7541 Impingement syndrome of right shoulder: Secondary | ICD-10-CM | POA: Diagnosis not present

## 2023-06-01 MED ORDER — METHYLPREDNISOLONE ACETATE 40 MG/ML IJ SUSP
40.0000 mg | INTRAMUSCULAR | Status: AC | PRN
  Administered 2023-06-01: 40 mg via INTRA_ARTICULAR

## 2023-06-01 MED ORDER — LIDOCAINE HCL 1 % IJ SOLN
3.0000 mL | INTRAMUSCULAR | Status: AC | PRN
  Administered 2023-06-01: 3 mL

## 2023-06-01 NOTE — Progress Notes (Signed)
HPI: Katrina Obrien comes in today due to right wrist and right shoulder and right arm pain.  She states that approximately 2-1/2 months ago she was walking her dog and fell.  She did see her primary care provider who ordered x-rays on her right wrist and shoulder.  Reviewed radiographs of her right shoulder and right wrist.  Right shoulder 3 view shows no acute fracture no acute findings.  Shoulder is well located.  Glenohumeral joint is well-maintained.  Multiple views of the right wrist shows no acute fractures or findings.  Wrist well located.  No dislocation or subluxations of the carpal bones.   She notes that overall her right arm shoulder pain is becoming worse.  She states she has problems use of the right wrist including writing lifting heavy objects turning doorknobs.  She has shooting pain down her arm which is made worse by driving.  Points to the lateral aspect of her elbow as source of pain.  She is right-hand dominant.  She denies any numbness tingling down the arm.  She has tried Tylenol with no relief.  She is diabetic hemoglobin A1c last was 7.1.  Review of systems: See HPI otherwise negative  Physical exam: General Well-developed well-nourished female no acute distress. Psych: Alert and oriented x 3 Respirations: Unlabored Vascular: Radial pulses are 2+ and equal symmetric Bilateral shoulders: 5 out of 5 strength with external and internal rotation against resistance.  Empty can test and liftoff test is negative bilaterally.  She does have pain however with external rotation of the right shoulder against resistance.  Impingement testing is negative bilaterally.  Tenderness along the medial border of the right scapula only Cervical spine: Good range of motion without pain. Bilateral elbows: Good range of motion without pain.  Tenderness over the lateral epicondyle right elbow only.  Provocative of the wrist maneuvers cause pain at the elbow. Bilateral wrist hands: Full motor bilateral  hands.  Sensation is intact throughout bilateral hands to light touch.  Grip strength is 5 out of 5 bilaterally  Impression: Cervical radiculopathy right upper extremity Right shoulder pain Right lateral epicondylitis  Plan: Will send her to formal physical therapy for her right shoulder, neck and right elbow lateral epicondylitis.  We performed right shoulder subacromial injection for both therapeutic and diagnostic purposes today.  She will follow-up with [place] in 4 weeks see how she is doing overall.  Questions were encouraged and answered at length.  Discussed with her lifting techniques to help with her lateral epicondylitis.      Procedure Note  Patient: Katrina Obrien             Date of Birth: June 13, 1956           MRN: [id number]             Visit Date: 06/01/2023  Procedures: Visit Diagnoses:  1. Impingement syndrome of right shoulder   2. Radiculopathy, cervical region   3. Lateral epicondylitis, right elbow     Large Joint Inj: R subacromial bursa on 06/01/2023 4:38 PM Indications: pain Details: 22 G 1.5 in needle, superior approach  Arthrogram: No  Medications: 3 mL lidocaine 1 %; 40 mg methylPREDNISolone acetate 40 MG/ML Outcome: tolerated well, no immediate complications Procedure, treatment alternatives, risks and benefits explained, specific risks discussed. Consent was given by the patient. Immediately prior to procedure a time out was called to verify the correct patient, procedure, equipment, support staff and site/side marked as required. Patient was prepped and draped  in the usual sterile fashion.

## 2023-06-02 ENCOUNTER — Other Ambulatory Visit: Payer: Medicare PPO

## 2023-06-02 DIAGNOSIS — R944 Abnormal results of kidney function studies: Secondary | ICD-10-CM | POA: Diagnosis not present

## 2023-06-03 LAB — URINALYSIS, COMPLETE
Bilirubin, UA: NEGATIVE
Ketones, UA: NEGATIVE
Leukocytes,UA: NEGATIVE
Nitrite, UA: NEGATIVE
Protein,UA: NEGATIVE
RBC, UA: NEGATIVE
Specific Gravity, UA: 1.02 (ref 1.005–1.030)
Urobilinogen, Ur: 0.2 mg/dL (ref 0.2–1.0)
pH, UA: 5.5 (ref 5.0–7.5)

## 2023-06-03 LAB — COMPREHENSIVE METABOLIC PANEL
ALT: 34 [IU]/L — ABNORMAL HIGH (ref 0–32)
AST: 27 [IU]/L (ref 0–40)
Albumin: 4.6 g/dL (ref 3.9–4.9)
Alkaline Phosphatase: 65 [IU]/L (ref 44–121)
BUN/Creatinine Ratio: 24 (ref 12–28)
BUN: 19 mg/dL (ref 8–27)
Bilirubin Total: 0.7 mg/dL (ref 0.0–1.2)
CO2: 18 mmol/L — ABNORMAL LOW (ref 20–29)
Calcium: 9.9 mg/dL (ref 8.7–10.3)
Chloride: 103 mmol/L (ref 96–106)
Creatinine, Ser: 0.79 mg/dL (ref 0.57–1.00)
Globulin, Total: 2.7 g/dL (ref 1.5–4.5)
Glucose: 156 mg/dL — ABNORMAL HIGH (ref 70–99)
Potassium: 4.8 mmol/L (ref 3.5–5.2)
Sodium: 140 mmol/L (ref 134–144)
Total Protein: 7.3 g/dL (ref 6.0–8.5)
eGFR: 82 mL/min/{1.73_m2} (ref >59)

## 2023-06-03 LAB — MICROSCOPIC EXAMINATION: Casts: NONE SEEN /[LPF]

## 2023-06-04 ENCOUNTER — Other Ambulatory Visit: Payer: Self-pay | Admitting: Family Medicine

## 2023-06-04 DIAGNOSIS — G2581 Restless legs syndrome: Secondary | ICD-10-CM

## 2023-06-08 ENCOUNTER — Other Ambulatory Visit: Payer: Self-pay | Admitting: Family Medicine

## 2023-06-08 DIAGNOSIS — E039 Hypothyroidism, unspecified: Secondary | ICD-10-CM

## 2023-06-15 ENCOUNTER — Encounter: Payer: Self-pay | Admitting: Physical Therapy

## 2023-06-15 ENCOUNTER — Other Ambulatory Visit: Payer: Self-pay

## 2023-06-15 ENCOUNTER — Ambulatory Visit: Payer: Medicare PPO | Admitting: Physical Therapy

## 2023-06-15 DIAGNOSIS — M542 Cervicalgia: Secondary | ICD-10-CM | POA: Diagnosis not present

## 2023-06-15 DIAGNOSIS — G8929 Other chronic pain: Secondary | ICD-10-CM | POA: Diagnosis not present

## 2023-06-15 DIAGNOSIS — M25611 Stiffness of right shoulder, not elsewhere classified: Secondary | ICD-10-CM | POA: Diagnosis not present

## 2023-06-15 DIAGNOSIS — M25511 Pain in right shoulder: Secondary | ICD-10-CM | POA: Diagnosis not present

## 2023-06-15 DIAGNOSIS — M6281 Muscle weakness (generalized): Secondary | ICD-10-CM | POA: Diagnosis not present

## 2023-06-15 NOTE — Therapy (Signed)
OUTPATIENT PHYSICAL THERAPY SHOULDER EVALUATION   Patient Name: Katrina Obrien MRN: [id number] DOB:1955-09-29, 67 y.o., female Today's Date: 06/15/2023  END OF SESSION:   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   Clotting disorder (HCC)    anticoagulant - coumadin hx DVT, PE   Coronary artery disease    s/p 3- vessel bypass    Diabetes mellitus without complication (HCC)    DVT (deep venous thrombosis) (HCC)    Heart murmur    Hyperlipidemia    Hypertension    Ischemic cardiomyopathy    40 to 45%   Left bundle branch block    Mechanical heart valve present    Jan 2012   Popliteal artery occlusion, left (HCC)    recent  status  post angioplasty   Pulmonary embolism (HCC)    associated with DVT   Pulmonary embolus (HCC)    Thyroid disease    hyperthyroidism   Past Surgical History:  Procedure Laterality Date   AORTIC VALVE REPLACEMENT (AVR)/CORONARY ARTERY BYPASS GRAFTING (CABG)  08/16/2010  VAN TRIGT   AV replacement w/19-mm mechanical St Jude valve ([id number]),CABGX3  lima to LAD, SVG to distal circ,SVG to posterior descending   ASD REPAIR     CARDIAC CATHETERIZATION  08/08/2010   ,LAD 50% PROX,605 TANDEM SEGMENTAL STENOSIS,LEFT CIRC 60%-AV GROOVE, RGT COR 40% PROX ON THE BEND ,80% PROX PDA,   CORONARY ARTERY BYPASS GRAFT  08/16/2010   LIMA TO lad,svg to distal circ ,svg to posterior descending   DOPPLER ECHOCARDIOGRAPHY  08/09/2010   EF35% to 40% ,bbicuspid ;severely  thickened ,severely calcified leaflets, LV normal   LEA doppler  08/07/2010   left popliteal occlusion ,and anterior tibial complete occlusion   NM MYOCAR PERF WALL MOTION  08/06/2010   EF 31%  LOW RISK SCAN   nuc     PV ANGIOGRAM  08/08/2010   successful PTA LEFT POPLITEAL   Patient Active Problem List   Diagnosis Date Noted   Restless leg syndrome 02/16/2023   Plantar  fasciitis of right foot 06/16/2022   Diarrhea due to drug- metformin 06/08/2019   Caregiver stress syndrome 03/08/2019   Vitamin D insufficiency 09/13/2018   White coat syndrome with diagnosis of hypertension 09/13/2018   Hyperlipidemia associated with type 2 diabetes mellitus (HCC) 02/03/2018   Hypothyroidism 02/03/2018   LBBB (left bundle branch block) 04/06/2014   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   Aortic stenosis 01/26/2013   S/P AVR 01/26/2013   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   Hypertension associated with diabetes (HCC) 01/26/2013   Familial hyperlipidemia 01/26/2013   Cardiomyopathy, ischemic 01/26/2013   DVT (deep venous thrombosis) (HCC) 01/26/2013   Pulmonary embolus (HCC) 01/26/2013   Uncontrolled type 2 diabetes mellitus with hyperglycemia (HCC) 01/26/2013   Long term (current) use of anticoagulants 10/11/2012    PCP: Saralyn Pilar PA-C   REFERRING PROVIDER: Kirtland Bouchard, PA-C  REFERRING DIAG: Diagnosis M54.12 (ICD-10-CM) - Radiculopathy, cervical region M77.11 (ICD-10-CM) - Lateral epicondylitis, right elbow M75.41 (ICD-10-CM) - Impingement syndrome of right shoulder  THERAPY DIAG:  No diagnosis found.  Rationale for Evaluation and Treatment: Rehabilitation  ONSET DATE: about 3 months ago   SUBJECTIVE:  SUBJECTIVE STATEMENT:  I was walking the dog and had a fall, I really fell hard on my right arm. Everything seemed to heal except for my wrist, elbow, and shoulder. Stepped on the side of the road wrong and fell, twisted my ankle and hit my head as well. Having ongoing aching in wrist/elbow/shoulder and its better, got a cortisone shot a couple of weeks ago and that is painful. 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 Having night pain, doesn't seem to matter if I lay on left or right sides but I do have to support my right arm with pillows. Having some right sided neck issues since the fall too, most of my pain is in the arm and shoulder.  Hand dominance: Right  PERTINENT HISTORY: See above   PAIN:  Are you having pain? No 0/10 at rest, can get up to 4/10 mostly at night   PRECAUTIONS: None  RED FLAGS: None   WEIGHT BEARING RESTRICTIONS: No  FALLS:  Has patient fallen in last 6 months? Yes. 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  DIAGNOSTIC FINDINGS:  IMPRESSION: Mild degenerative change of the radiocarpal and thumb carpometacarpal joints.     IMPRESSION: Mild to moderate acromioclavicular and trace glenohumeral degenerative spurring.  PATIENT SURVEYS:  FOTO 50, predicted 69 in 9 visits   COGNITION: Overall cognitive status: Within functional limits for tasks assessed     SENSATION: Not tested  POSTURE: Rounded shoulders, forward head   UPPER EXTREMITY ROM:   Active ROM Right eval Left eval  Shoulder flexion 115* 128*  Shoulder extension    Shoulder abduction 165* pain 175*  Shoulder adduction    Shoulder internal rotation L1 T10  Shoulder external rotation T4  T4  Elbow flexion WNL  WNL    Elbow extension WNL  WNL   Wrist flexion WNL  WNL   Wrist extension WNL  WNL   Wrist ulnar deviation    Wrist radial deviation    Wrist pronation WNL  WNL   Wrist supination WNL  WNL   Cervical flexion  48* ---  Cervical extension  30* ---  Cervical lateral flexion  35* pain 42*  Cervical rotation  42* pain 70*  (Blank rows = not tested)  UPPER EXTREMITY MMT:  MMT Right eval Left eval  Shoulder flexion 3- 4+  Shoulder extension    Shoulder abduction 3- 4+  Shoulder adduction    Shoulder internal rotation 3- 4+  Shoulder external rotation 4 4+  Middle trapezius    Lower trapezius    Elbow flexion 4+ 4+  Elbow extension 4 4+  Wrist flexion    Wrist extension    Wrist ulnar deviation    Wrist radial deviation    Wrist pronation  Wrist supination    Grip strength (lbs)    (Blank rows = not tested)    PALPATION:  Tender R rotator cuff, upper trap and levator, R delts and pec    TODAY'S TREATMENT:                                                                                                                                         DATE:   06/15/23- Eval, POC, HEP  Scap retractions red TB Shoulder extensions red TB Shoulder flexion stretch on door Backward shoulder rolls UBE L1 x6 minutes backwards      PATIENT EDUCATION: Education details: exam findings, POC, HEP, role of PT and exercise in managing pain and improving posture/function  Person educated: Patient Education method: Explanation, Demonstration, and Handouts Education comprehension: verbalized understanding, returned demonstration, and needs further education  HOME EXERCISE PROGRAM: Access Code: [email or web address] URL: [email or web address] Date: 06/15/2023 Prepared by: Nedra Hai  Exercises - Scapular Retraction with Resistance  - 1 x daily - 7 x weekly - 2 sets - 10 reps - 2 seconds  hold - Shoulder extension with resistance - Neutral  - 1 x daily - 7 x weekly - 2 sets - 10 reps -  2 seconds  hold - Standing Single Arm Shoulder Flexion Stretch on Wall  - 1 x daily - 7 x weekly - 2 sets - 10 reps - 5 seconds  hold - Standing Backward Shoulder Rolls  - 1 x daily - 7 x weekly - 2 sets - 10 reps  ASSESSMENT:  CLINICAL IMPRESSION: Patient is a 67 y.o. F who was seen today for physical therapy evaluation and treatment for Diagnosis M54.12 (ICD-10-CM) - Radiculopathy, cervical region M77.11 (ICD-10-CM) - Lateral epicondylitis, right elbow M75.41 (ICD-10-CM) - Impingement syndrome of right shoulder. Exam with objective findings as above, right now her main concern is shoulder pain as the other involved joints have considerably improved, but will continue to monitor. Will benefit from balance screen given that this injury was the result of a fall.    OBJECTIVE IMPAIRMENTS: decreased mobility, decreased ROM, decreased strength, hypomobility, increased fascial restrictions, increased muscle spasms, impaired flexibility, impaired UE functional use, postural dysfunction, and pain.   ACTIVITY LIMITATIONS: carrying, lifting, bathing, toileting, dressing, reach over head, hygiene/grooming, and caring for others  PARTICIPATION LIMITATIONS: meal prep, cleaning, laundry, shopping, community activity, occupation, yard work, and church  PERSONAL FACTORS: Age, Behavior pattern, Education, Financial risk analyst, Past/current experiences, Profession, Sex, Social background, and Time since onset of injury/illness/exacerbation are also affecting patient's functional outcome.   REHAB POTENTIAL: Good  CLINICAL DECISION MAKING: Stable/uncomplicated  EVALUATION COMPLEXITY: Low   GOALS: Goals reviewed with patient? No  SHORT TERM GOALS: Target date: 07/06/2023    Will be compliant with appropriate progressive HEP  Baseline: Goal status: INITIAL  2.  AROM R shoulder to be equal to that of the left and pain free  Baseline:  Goal status: INITIAL  3.  Cervical AROM to be symmetrical and pain free   Baseline:  Goal status: INITIAL  4.  Will demonstrate improved awareness of posture with functional task performance and use of postural aides PRN  Baseline:  Goal status: INITIAL    LONG TERM GOALS: Target date: 07/27/2023    MMT to be equal and symmetrical in BUEs  Baseline:  Goal status: INITIAL  2.  Pain to be no more than 2/10 at worst R UE  Baseline:  Goal status: INITIAL  3.  Will score at least 20/24 on DGI to show reduced fall risk  Baseline:  Goal status: INITIAL  4.  Will be able to perform all functional work and recreational tasks without increase from resting pain levels including reaching overhead and lifting chairs at work  Baseline:  Goal status: INITIAL  5.  FOTO score to be within 5 points of predicted by time of DC  Baseline:  Goal status: INITIAL    PLAN:  PT FREQUENCY: 2x/week  PT DURATION: 6 weeks  PLANNED INTERVENTIONS: 97164- PT Re-evaluation, 97110-Therapeutic exercises, 97530- Therapeutic activity, 97112- Neuromuscular re-education, 97535- Self Care, 96045- Manual therapy, 97014- Electrical stimulation (unattended), Patient/Family education, Balance training, Dry Needling  PLAN FOR NEXT SESSION: shoulder and cervical ROM and strength, postural training, manual/DN as desired. Needs DGI next visit for balance assessment   Nedra Hai, PT, DPT 06/15/23 1:45 PM   Referring diagnosis? Diagnosis M54.12 (ICD-10-CM) - Radiculopathy, cervical region M77.11 (ICD-10-CM) - Lateral epicondylitis, right elbow M75.41 (ICD-10-CM) - Impingement syndrome of right shoulder  Treatment diagnosis? (if different than referring diagnosis) M25.511, G89.29, M54.2, M25.611, M62.81   What was this (referring dx) caused by? []  Surgery [x]  Fall []  Ongoing issue []  Arthritis []  Other: ____________  Laterality: [x]  Rt []  Lt []  Both  Check all possible CPT codes:  *CHOOSE 10 OR LESS*    See Planned Interventions listed in the Plan section of the  Evaluation.

## 2023-06-19 ENCOUNTER — Ambulatory Visit: Payer: Medicare PPO | Admitting: Physical Therapy

## 2023-06-19 ENCOUNTER — Encounter: Payer: Self-pay | Admitting: Physical Therapy

## 2023-06-19 DIAGNOSIS — M25611 Stiffness of right shoulder, not elsewhere classified: Secondary | ICD-10-CM | POA: Diagnosis not present

## 2023-06-19 DIAGNOSIS — M25511 Pain in right shoulder: Secondary | ICD-10-CM | POA: Diagnosis not present

## 2023-06-19 DIAGNOSIS — M6281 Muscle weakness (generalized): Secondary | ICD-10-CM

## 2023-06-19 DIAGNOSIS — M542 Cervicalgia: Secondary | ICD-10-CM

## 2023-06-19 DIAGNOSIS — G8929 Other chronic pain: Secondary | ICD-10-CM

## 2023-06-19 NOTE — Therapy (Signed)
OUTPATIENT PHYSICAL THERAPY SHOULDER TREATMENT   Patient Name: Katrina Obrien MRN: [id number] DOB:[birth year], 67 y.o., female Today's Date: 06/19/2023  END OF SESSION:  PT End of Session - 06/19/23 1443     Visit Number 2    Number of Visits 13    Date for PT Re-Evaluation 07/27/23    Authorization Type [employer] MCR    Authorization Time Period 06/15/23 to 07/27/23    Progress Note Due on Visit 10    PT Start Time 1435    PT Stop Time 1514    PT Time Calculation (min) 39 min    Activity Tolerance Patient tolerated treatment well    Behavior During Therapy WFL for tasks assessed/performed             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   Clotting disorder (HCC)    anticoagulant - coumadin hx DVT, PE   Coronary artery disease    s/p 3- vessel bypass    Diabetes mellitus without complication (HCC)    DVT (deep venous thrombosis) (HCC)    Heart murmur    Hyperlipidemia    Hypertension    Ischemic cardiomyopathy    40 to 45%   Left bundle branch block    Mechanical heart valve present    Jan 2012   Popliteal artery occlusion, left (HCC)    recent  status  post angioplasty   Pulmonary embolism (HCC)    associated with DVT   Pulmonary embolus (HCC)    Thyroid disease    hyperthyroidism   Past Surgical History:  Procedure Laterality Date   AORTIC VALVE REPLACEMENT (AVR)/CORONARY ARTERY BYPASS GRAFTING (CABG)  08/16/2010  VAN TRIGT   AV replacement w/19-mm mechanical St Jude valve ([id number]),CABGX3  lima to LAD, SVG to distal circ,SVG to posterior descending   ASD REPAIR     CARDIAC CATHETERIZATION  08/08/2010   ,LAD 50% PROX,605 TANDEM SEGMENTAL STENOSIS,LEFT CIRC 60%-AV GROOVE, RGT COR 40% PROX ON THE BEND ,80% PROX PDA,   CORONARY ARTERY BYPASS GRAFT  08/16/2010   LIMA TO lad,svg to distal circ ,svg to posterior descending   DOPPLER  ECHOCARDIOGRAPHY  08/09/2010   EF35% to 40% ,bbicuspid ;severely  thickened ,severely calcified leaflets, LV normal   LEA doppler  08/07/2010   left popliteal occlusion ,and anterior tibial complete occlusion   NM MYOCAR PERF WALL MOTION  08/06/2010   EF 31%  LOW RISK SCAN   nuc     PV ANGIOGRAM  08/08/2010   successful PTA LEFT POPLITEAL   Patient Active Problem List   Diagnosis Date Noted   Restless leg syndrome 02/16/2023   Plantar fasciitis of right foot 06/16/2022   Diarrhea due to drug- metformin 06/08/2019   Caregiver stress syndrome 03/08/2019   Vitamin D insufficiency 09/13/2018   White coat syndrome with diagnosis of hypertension 09/13/2018   Hyperlipidemia associated with type 2 diabetes mellitus (HCC) 02/03/2018   Hypothyroidism 02/03/2018   LBBB (left bundle branch block) 04/06/2014   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   Aortic stenosis 01/26/2013   S/P AVR 01/26/2013   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   Hypertension associated with diabetes (HCC) 01/26/2013   Familial hyperlipidemia 01/26/2013   Cardiomyopathy, ischemic 01/26/2013   DVT (deep venous thrombosis) (HCC) 01/26/2013   Pulmonary embolus (HCC) 01/26/2013   Uncontrolled type 2 diabetes mellitus with hyperglycemia (  HCC) 01/26/2013   Long term (current) use of anticoagulants 10/11/2012    PCP: Saralyn Pilar PA-C   REFERRING PROVIDER: Kirtland Bouchard, PA-C  REFERRING DIAG: Diagnosis M54.12 (ICD-10-CM) - Radiculopathy, cervical region M77.11 (ICD-10-CM) - Lateral epicondylitis, right elbow M75.41 (ICD-10-CM) - Impingement syndrome of right shoulder  THERAPY DIAG:  Chronic right shoulder pain  Cervicalgia  Stiffness of right shoulder, not elsewhere classified  Muscle weakness (generalized)  Rationale for Evaluation and Treatment: Rehabilitation  ONSET DATE: about 3 months ago   SUBJECTIVE:                                                                                                                                                                                       SUBJECTIVE STATEMENT:  I had a bad night last night, wrist and shoulder were sore and I'm not sure why. Last night was pretty tough, I've been pretty sore today. My shoulder has been pretty sore especially when I reach at work.     EVAL: I was walking the dog and had a fall, I really fell hard on my right arm. Everything seemed to heal except for my wrist, elbow, and shoulder. Stepped on the side of the road wrong and fell, twisted my ankle and hit my head as well. Having ongoing aching in wrist/elbow/shoulder and its better, got a cortisone shot a couple of weeks ago and that is painful. 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 Having night pain, doesn't seem to matter if I lay on left or right sides but I do have to support my right arm with pillows. Having some right sided neck issues since the fall too, most of my pain is in the arm and shoulder.  Hand dominance: Right  PERTINENT HISTORY: See above   PAIN:  Are you having pain? Yes: NPRS scale: 4/10 Pain location: R shoulder soreness  Pain description: soreness  Aggravating factors: reaching, putting on coat, stress/tension Relieving factors: taking enough tylenol to address pain    PRECAUTIONS: None  RED FLAGS: None   WEIGHT BEARING RESTRICTIONS: No  FALLS:  Has patient fallen in last 6 months? Yes. 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  DIAGNOSTIC FINDINGS:  IMPRESSION: Mild degenerative change of the radiocarpal and thumb carpometacarpal joints.     IMPRESSION: Mild to moderate acromioclavicular and trace glenohumeral degenerative spurring.  PATIENT SURVEYS:  FOTO 50, predicted 69 in 9 visits   COGNITION: Overall cognitive status: Within functional limits for tasks assessed     SENSATION: Not tested  POSTURE: Rounded shoulders, forward head   UPPER EXTREMITY ROM:   Active ROM Right eval Left eval  Shoulder flexion 115* 128*  Shoulder extension    Shoulder abduction 165* pain 175*  Shoulder adduction    Shoulder internal rotation L1 T10  Shoulder external rotation T4  T4  Elbow flexion WNL  WNL   Elbow extension WNL  WNL   Wrist flexion WNL  WNL   Wrist extension WNL  WNL   Wrist ulnar deviation    Wrist radial deviation    Wrist pronation WNL  WNL   Wrist supination WNL  WNL   Cervical flexion  48* ---  Cervical extension  30* ---  Cervical lateral flexion  35* pain 42*  Cervical rotation  42* pain 70*  (Blank rows = not tested)  UPPER EXTREMITY MMT:  MMT Right eval Left eval  Shoulder flexion 3- 4+  Shoulder extension    Shoulder abduction 3- 4+  Shoulder adduction    Shoulder internal rotation 3- 4+  Shoulder external rotation 4 4+  Middle trapezius    Lower trapezius    Elbow flexion 4+ 4+  Elbow extension 4 4+  Wrist flexion    Wrist extension    Wrist ulnar deviation    Wrist radial deviation    Wrist pronation    Wrist supination    Grip strength (lbs)    (Blank rows = not tested)    PALPATION:  Tender R rotator cuff, upper trap and levator, R delts and pec    TODAY'S TREATMENT:                                                                                                                                          DATE:    06/19/23  UBE L1x6 minutes backwards only, all 4 extremities  Supine AAROM flexion 1# 10x3 second holds  Supine AAROM ABD 1# 10x3 second holds Seated AAROM ER 1# 10x3 second holds  Pec stretch in corner 3x30 seconds  Isometrics on wall: shoulder flexion 10x3 second holds, ABD 10x3 second holds, ER 10x3 second holds Scap retractions red TB x12       06/15/23- Eval, POC, HEP  Scap retractions red TB Shoulder extensions red TB Shoulder flexion stretch on  door Backward shoulder rolls UBE L1 x6 minutes backwards      PATIENT EDUCATION: Education details: exam findings, POC, HEP, role of PT and exercise in managing pain and improving posture/function  Person educated: Patient Education method: Explanation, Demonstration, and Handouts Education comprehension: verbalized understanding, returned demonstration, and needs further education  HOME EXERCISE PROGRAM: Access Code: [email or web address] URL: [email or web address] Date: 06/15/2023 Prepared by: Nedra Hai  Exercises - Scapular Retraction with Resistance  - 1 x daily - 7 x weekly - 2 sets - 10 reps - 2 seconds  hold - Shoulder extension with resistance - Neutral  - 1 x daily - 7 x weekly - 2 sets - 10 reps - 2 seconds  hold - Standing Single Arm Shoulder Flexion Stretch on Wall  - 1 x daily - 7 x weekly - 2 sets - 10 reps - 5 seconds  hold - Standing Backward Shoulder Rolls  - 1 x daily - 7 x weekly - 2 sets - 10 reps  ASSESSMENT:  CLINICAL IMPRESSION:  Pt arrives today having more pain than usual- had a bad night last night, cause of increased pain unclear right now. Warmed up on UBE for increased tissue perfusion and ROM, otherwise worked on functional exercises for ROM today as pain tolerance allowed. Hopefully she will be feeling better next session, recommended weighted shoulder pendulums and MHP at home if  she has severe night pains again.     EVAL: Patient is a 67 y.o. F who was seen today for physical therapy evaluation and treatment for Diagnosis M54.12 (ICD-10-CM) - Radiculopathy, cervical region M77.11 (ICD-10-CM) - Lateral epicondylitis, right elbow M75.41 (ICD-10-CM) - Impingement syndrome of right shoulder. Exam with objective findings as above, right now her main concern is shoulder pain as the other involved joints have considerably improved, but will continue to monitor. Will benefit from balance screen given that this injury was the result of a fall.    OBJECTIVE IMPAIRMENTS: decreased mobility, decreased ROM, decreased strength, hypomobility, increased fascial restrictions, increased muscle spasms, impaired flexibility, impaired UE functional use, postural dysfunction, and pain.   ACTIVITY LIMITATIONS: carrying, lifting, bathing, toileting, dressing, reach over head, hygiene/grooming, and caring for others  PARTICIPATION LIMITATIONS: meal prep, cleaning, laundry, shopping, community activity, occupation, yard work, and church  PERSONAL FACTORS: Age, Behavior pattern, Education, Financial risk analyst, Past/current experiences, Profession, Sex, Social background, and Time since onset of injury/illness/exacerbation are also affecting patient's functional outcome.   REHAB POTENTIAL: Good  CLINICAL DECISION MAKING: Stable/uncomplicated  EVALUATION COMPLEXITY: Low   GOALS: Goals reviewed with patient? No  SHORT TERM GOALS: Target date: 07/06/2023    Will be compliant with appropriate progressive HEP  Baseline: Goal status: INITIAL  2.  AROM R shoulder to be equal to that of the left and pain free  Baseline:  Goal status: INITIAL  3.  Cervical AROM to be symmetrical and pain free  Baseline:  Goal status: INITIAL  4.  Will demonstrate improved awareness of posture with functional task performance and use of postural aides PRN  Baseline:  Goal status: INITIAL    LONG TERM GOALS:  Target date: 07/27/2023    MMT to be equal and symmetrical in BUEs  Baseline:  Goal status: INITIAL  2.  Pain to be no more than 2/10 at worst R UE  Baseline:  Goal status: INITIAL  3.  Will score at least 20/24 on DGI to show reduced fall risk  Baseline:  Goal status: INITIAL  4.  Will be able to  perform all functional work and recreational tasks without increase from resting pain levels including reaching overhead and lifting chairs at work  Baseline:  Goal status: INITIAL  5.  FOTO score to be within 5 points of predicted by time of DC  Baseline:  Goal status: INITIAL    PLAN:  PT FREQUENCY: 2x/week  PT DURATION: 6 weeks  PLANNED INTERVENTIONS: 97164- PT Re-evaluation, 97110-Therapeutic exercises, 97530- Therapeutic activity, 97112- Neuromuscular re-education, 97535- Self Care, 21308- Manual therapy, 97014- Electrical stimulation (unattended), Patient/Family education, Balance training, Dry Needling  PLAN FOR NEXT SESSION: shoulder and cervical ROM and strength, postural training, manual/DN as desired. Needs DGI done at some point for balance assessment/initial data point. Wants to try dry needling next visit   Nedra Hai, PT, DPT 06/19/23 3:14 PM   Referring diagnosis? Diagnosis M54.12 (ICD-10-CM) - Radiculopathy, cervical region M77.11 (ICD-10-CM) - Lateral epicondylitis, right elbow M75.41 (ICD-10-CM) - Impingement syndrome of right shoulder  Treatment diagnosis? (if different than referring diagnosis) M25.511, G89.29, M54.2, M25.611, M62.81   What was this (referring dx) caused by? []  Surgery [x]  Fall []  Ongoing issue []  Arthritis []  Other: ____________  Laterality: [x]  Rt []  Lt []  Both  Check all possible CPT codes:  *CHOOSE 10 OR LESS*    See Planned Interventions listed in the Plan section of the Evaluation.

## 2023-06-23 ENCOUNTER — Ambulatory Visit: Payer: Medicare PPO | Admitting: Physical Therapy

## 2023-06-23 ENCOUNTER — Encounter: Payer: Self-pay | Admitting: Physical Therapy

## 2023-06-23 DIAGNOSIS — M25511 Pain in right shoulder: Secondary | ICD-10-CM

## 2023-06-23 DIAGNOSIS — M6281 Muscle weakness (generalized): Secondary | ICD-10-CM

## 2023-06-23 DIAGNOSIS — M542 Cervicalgia: Secondary | ICD-10-CM | POA: Diagnosis not present

## 2023-06-23 DIAGNOSIS — M25611 Stiffness of right shoulder, not elsewhere classified: Secondary | ICD-10-CM | POA: Diagnosis not present

## 2023-06-23 DIAGNOSIS — G8929 Other chronic pain: Secondary | ICD-10-CM

## 2023-06-23 NOTE — Therapy (Signed)
OUTPATIENT PHYSICAL THERAPY SHOULDER TREATMENT   Patient Name: Katrina Obrien MRN: [id number] DOB:1955/10/30, 67 y.o., female Today's Date: 06/23/2023  END OF SESSION:  PT End of Session - 06/23/23 1528     Visit Number 3    Number of Visits 13    Date for PT Re-Evaluation 07/27/23    Authorization Type [employer] MCR    Authorization Time Period 06/15/23 to 07/27/23    Progress Note Due on Visit 10    PT Start Time 1430    PT Stop Time 1515    PT Time Calculation (min) 45 min    Activity Tolerance Patient tolerated treatment well    Behavior During Therapy [facility] for tasks assessed/performed              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   Clotting disorder (HCC)    anticoagulant - coumadin hx DVT, PE   Coronary artery disease    s/p 3- vessel bypass    Diabetes mellitus without complication (HCC)    DVT (deep venous thrombosis) (HCC)    Heart murmur    Hyperlipidemia    Hypertension    Ischemic cardiomyopathy    40 to 45%   Left bundle branch block    Mechanical heart valve present    Jan 2012   Popliteal artery occlusion, left (HCC)    recent  status  post angioplasty   Pulmonary embolism (HCC)    associated with DVT   Pulmonary embolus (HCC)    Thyroid disease    hyperthyroidism   Past Surgical History:  Procedure Laterality Date   AORTIC VALVE REPLACEMENT (AVR)/CORONARY ARTERY BYPASS GRAFTING (CABG)  08/16/2010  VAN TRIGT   AV replacement w/19-mm mechanical St Jude valve ([id number]),CABGX3  lima to LAD, SVG to distal circ,SVG to posterior descending   ASD REPAIR     CARDIAC CATHETERIZATION  08/08/2010   ,LAD 50% PROX,605 TANDEM SEGMENTAL STENOSIS,LEFT CIRC 60%-AV GROOVE, RGT COR 40% PROX ON THE BEND ,80% PROX PDA,   CORONARY ARTERY BYPASS GRAFT  08/16/2010   LIMA TO lad,svg to distal circ ,svg to posterior descending   DOPPLER  ECHOCARDIOGRAPHY  08/09/2010   EF35% to 40% ,bbicuspid ;severely  thickened ,severely calcified leaflets, LV normal   LEA doppler  08/07/2010   left popliteal occlusion ,and anterior tibial complete occlusion   NM MYOCAR PERF WALL MOTION  08/06/2010   EF 31%  LOW RISK SCAN   nuc     PV ANGIOGRAM  08/08/2010   successful PTA LEFT POPLITEAL   Patient Active Problem List   Diagnosis Date Noted   Restless leg syndrome 02/16/2023   Plantar fasciitis of right foot 06/16/2022   Diarrhea due to drug- metformin 06/08/2019   Caregiver stress syndrome 03/08/2019   Vitamin D insufficiency 09/13/2018   White coat syndrome with diagnosis of hypertension 09/13/2018   Hyperlipidemia associated with type 2 diabetes mellitus (HCC) 02/03/2018   Hypothyroidism 02/03/2018   LBBB (left bundle branch block) 04/06/2014   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   Aortic stenosis 01/26/2013   S/P AVR 01/26/2013   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   Hypertension associated with diabetes (HCC) 01/26/2013   Familial hyperlipidemia 01/26/2013   Cardiomyopathy, ischemic 01/26/2013   DVT (deep venous thrombosis) (HCC) 01/26/2013   Pulmonary embolus (HCC) 01/26/2013   Uncontrolled type 2 diabetes mellitus with  hyperglycemia (HCC) 01/26/2013   Long term (current) use of anticoagulants 10/11/2012    PCP: Saralyn Pilar PA-C   REFERRING PROVIDER: Kirtland Bouchard, PA-C  REFERRING DIAG: Diagnosis M54.12 (ICD-10-CM) - Radiculopathy, cervical region M77.11 (ICD-10-CM) - Lateral epicondylitis, right elbow M75.41 (ICD-10-CM) - Impingement syndrome of right shoulder  THERAPY DIAG:  Chronic right shoulder pain  Cervicalgia  Stiffness of right shoulder, not elsewhere classified  Muscle weakness (generalized)  Rationale for Evaluation and Treatment: Rehabilitation  ONSET DATE: about 3 months ago   SUBJECTIVE:                                                                                                                                                                                       SUBJECTIVE STATEMENT: Pt arriving to therapy reporting Saturday night her Rt shoulder got stuck in abd/flexed position when she got out of the shoulder. Pt reporting her pain reached 9-10/10 at that moment.     EVAL: I was walking the dog and had a fall, I really fell hard on my right arm. Everything seemed to heal except for my wrist, elbow, and shoulder. Stepped on the side of the road wrong and fell, twisted my ankle and hit my head as well. Having ongoing aching in wrist/elbow/shoulder and its better, got a cortisone shot a couple of weeks ago and that is painful. 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 Having night pain, doesn't seem to matter if I lay on left or right sides but I do have to support my right arm with pillows. Having some right sided neck issues since the fall too, most of my pain is in the arm and shoulder.  Hand dominance: Right  PERTINENT HISTORY: See above   PAIN:  Are you having pain? Yes: NPRS scale: 2-3/10 Pain location: R shoulder soreness  Pain description: soreness  Aggravating factors: reaching, putting on coat, stress/tension Relieving factors: taking enough tylenol to address pain    PRECAUTIONS: None  RED FLAGS: None   WEIGHT BEARING RESTRICTIONS: No  FALLS:  Has patient fallen in last 6 months? Yes. 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  DIAGNOSTIC FINDINGS:  IMPRESSION: Mild degenerative change of the radiocarpal and thumb carpometacarpal joints.     IMPRESSION: Mild to moderate acromioclavicular and trace glenohumeral degenerative spurring.  PATIENT SURVEYS:  FOTO 50, predicted 69 in 9 visits   COGNITION: Overall cognitive status: Within functional limits for tasks assessed     SENSATION: Not tested  POSTURE: Rounded shoulders, forward head   UPPER EXTREMITY ROM:   Active ROM Right eval Left eval  Shoulder flexion 115* 128*  Shoulder extension    Shoulder abduction 165* pain 175*  Shoulder adduction    Shoulder internal rotation L1 T10  Shoulder external rotation T4  T4  Elbow flexion WNL  WNL   Elbow extension WNL  WNL   Wrist flexion WNL  WNL   Wrist extension WNL  WNL   Wrist ulnar deviation    Wrist radial deviation    Wrist pronation WNL  WNL   Wrist supination WNL  WNL   Cervical flexion  48* ---  Cervical extension  30* ---  Cervical lateral flexion  35* pain 42*  Cervical rotation  42* pain 70*  (Blank rows = not tested)  UPPER EXTREMITY MMT:  MMT Right eval Left eval  Shoulder flexion 3- 4+  Shoulder extension    Shoulder abduction 3- 4+  Shoulder adduction    Shoulder internal rotation 3- 4+  Shoulder external rotation 4 4+  Middle trapezius    Lower trapezius    Elbow flexion 4+ 4+  Elbow extension 4 4+  Wrist flexion    Wrist extension    Wrist ulnar deviation    Wrist radial deviation    Wrist pronation    Wrist supination    Grip strength (lbs)    (Blank rows = not tested)    PALPATION:  Tender R rotator cuff, upper trap and levator, R delts and pec    TODAY'S TREATMENT:                                                                                                                                          DATE:  06/23/23:  TherEx:  UBE: level 1 x 2 minutes each direction Rows: c green TB 2 x 10 holding 3 sec Rt shoulder ER c red TB 2 x 10  Shoulder extension red TB  2 x 10  Manual:  Skilled palpation to active trigger points Trigger Point Dry-Needling  Treatment instructions: Expect mild to moderate muscle soreness. S/S of pneumothorax if dry needled over a lung field, and to seek immediate medical attention should they occur. Patient verbalized understanding of these instructions and education.  Patient Consent Given: Yes Education handout  provided: Yes Muscles treated: left deltoid Electrical stimulation performed: No Parameters: N/A Treatment response/outcome: twitch response noted, pt with good response to treatment  Moist heat x 5 minutes following DN   06/19/23  UBE L1x6 minutes backwards only, all 4 extremities  Supine AAROM flexion 1# 10x3 second holds  Supine AAROM ABD 1# 10x3 second holds Seated AAROM ER 1# 10x3 second holds  Pec stretch in corner 3x30 seconds  Isometrics on wall: shoulder flexion 10x3 second holds, ABD 10x3 second holds, ER 10x3 second holds Scap retractions red TB x12       06/15/23- Eval, POC, HEP  Scap retractions red TB Shoulder extensions red TB Shoulder flexion stretch on door Backward shoulder rolls UBE L1 x6 minutes backwards      PATIENT EDUCATION: Education details: exam findings, POC, HEP, role of PT and exercise in managing pain and improving posture/function  Person educated: Patient Education method: Explanation, Demonstration, and Handouts Education comprehension: verbalized understanding, returned demonstration, and needs further education  HOME EXERCISE PROGRAM: Access Code: [email or web address] URL: [email or web address] Date: 06/23/2023 Prepared by: Narda Amber  Exercises - Scapular Retraction with Resistance  - 1 x daily - 7 x weekly - 2 sets - 10  reps - 2 seconds  hold - Shoulder extension with resistance - Neutral  - 1 x daily - 7 x weekly - 2 sets - 10 reps - 2 seconds  hold - Standing Single Arm Shoulder Flexion Stretch on Wall  - 1 x daily - 7 x weekly - 2 sets - 10 reps - 5 seconds  hold - Standing Backward Shoulder Rolls  - 1 x daily - 7 x weekly - 2 sets - 10 reps - Shoulder External Rotation with Anchored Resistance with Towel Under Elbow  - 1-2 x daily - 7 x weekly - 2 sets - 10 reps  ASSESSMENT:  CLINICAL IMPRESSION:  06/23/2023 Pt reporting episode over thew weekend while taking a shower that her Rt arm was "stuck" in abd. Pt reporting 9-10/10 pain. Pt arriving today  reporting 2-3/10 pain in her Rt shoulder. Pt wishing to try DN with good response noted. Pt reporting less pain at end of session. Pt was issued DN handout with education. Recommend continued skilled PT interventions to maximize pt's function.      EVAL: Patient is a 67 y.o. F who was seen today for physical therapy evaluation and treatment for Diagnosis M54.12 (ICD-10-CM) - Radiculopathy, cervical region M77.11 (ICD-10-CM) - Lateral epicondylitis, right elbow M75.41 (ICD-10-CM) - Impingement syndrome of right shoulder. Exam with objective findings as above, right now her main concern is shoulder pain as the other involved joints have considerably improved, but will continue to monitor. Will benefit from balance screen given that this injury was the result of a fall.    OBJECTIVE IMPAIRMENTS: decreased mobility, decreased ROM, decreased strength, hypomobility, increased fascial restrictions, increased muscle spasms, impaired flexibility, impaired UE functional use, postural dysfunction, and pain.   ACTIVITY LIMITATIONS: carrying, lifting, bathing, toileting, dressing, reach over head, hygiene/grooming, and caring for others  PARTICIPATION LIMITATIONS: meal prep, cleaning, laundry, shopping, community activity, occupation, yard work, and church  PERSONAL  FACTORS: Age, Behavior pattern, Education, Financial risk analyst, Past/current experiences, Profession, Sex, Social background, and Time since onset of injury/illness/exacerbation are also affecting patient's functional outcome.   REHAB POTENTIAL: Good  CLINICAL DECISION MAKING: Stable/uncomplicated  EVALUATION COMPLEXITY: Low   GOALS: Goals reviewed with patient? No  SHORT TERM GOALS: Target date: 07/06/2023    Will be compliant with appropriate  progressive HEP  Baseline: Goal status: On-going 06/23/23  2.  AROM R shoulder to be equal to that of the left and pain free  Baseline:  Goal status:on-going 06/23/23  3.  Cervical AROM to be symmetrical and pain free  Baseline:  Goal status:on-going 06/23/23  4.  Will demonstrate improved awareness of posture with functional task performance and use of postural aides PRN  Baseline:  Goal status: 06/23/23   LONG TERM GOALS: Target date: 07/27/2023    MMT to be equal and symmetrical in BUEs  Baseline:  Goal status: INITIAL  2.  Pain to be no more than 2/10 at worst R UE  Baseline:  Goal status: INITIAL  3.  Will score at least 20/24 on DGI to show reduced fall risk  Baseline:  Goal status: INITIAL  4.  Will be able to perform all functional work and recreational tasks without increase from resting pain levels including reaching overhead and lifting chairs at work  Baseline:  Goal status: INITIAL  5.  FOTO score to be within 5 points of predicted by time of DC  Baseline:  Goal status: INITIAL    PLAN:  PT FREQUENCY: 2x/week  PT DURATION: 6 weeks  PLANNED INTERVENTIONS: 97164- PT Re-evaluation, 97110-Therapeutic exercises, 97530- Therapeutic activity, 97112- Neuromuscular re-education, 97535- Self Care, 84132- Manual therapy, 97014- Electrical stimulation (unattended), Patient/Family education, Balance training, Dry Needling  PLAN FOR NEXT SESSION: shoulder and cervical ROM and strength, postural training, manual/DN as desired.  Needs DGI done at some point for balance assessment/initial data point.    Narda Amber, PT, MPT 06/23/23 3:29 PM  06/23/23 3:29 PM   Referring diagnosis? Diagnosis M54.12 (ICD-10-CM) - Radiculopathy, cervical region M77.11 (ICD-10-CM) - Lateral epicondylitis, right elbow M75.41 (ICD-10-CM) - Impingement syndrome of right shoulder  Treatment diagnosis? (if different than referring diagnosis) M25.511, G89.29, M54.2, M25.611, M62.81   What was this (referring dx) caused by? []  Surgery [x]  Fall []  Ongoing issue []  Arthritis []  Other: ____________  Laterality: [x]  Rt []  Lt []  Both  Check all possible CPT codes:  *CHOOSE 10 OR LESS*    See Planned Interventions listed in the Plan section of the Evaluation.

## 2023-06-29 ENCOUNTER — Ambulatory Visit: Payer: Medicare PPO | Admitting: Physician Assistant

## 2023-06-29 ENCOUNTER — Encounter: Payer: Self-pay | Admitting: Physician Assistant

## 2023-06-29 DIAGNOSIS — M5412 Radiculopathy, cervical region: Secondary | ICD-10-CM

## 2023-06-29 DIAGNOSIS — M7711 Lateral epicondylitis, right elbow: Secondary | ICD-10-CM

## 2023-06-29 DIAGNOSIS — M7541 Impingement syndrome of right shoulder: Secondary | ICD-10-CM | POA: Diagnosis not present

## 2023-06-29 NOTE — Progress Notes (Signed)
HPI: Mrs. Toellner returns today follow-up of her right shoulder neck and elbow pain.  She states that the cortisone injection on 06/01/2023 gave her at least 75% relief for 1 week.  She has also been going to therapy.  States that she is no longer having neck pain or elbow pain.  She feels that she is 50% overall better.  She denies any radicular symptoms down the right arm.  Week ago she had 1 episode of severe pain in her right shoulder she points to the posterior aspect of the right shoulder glenohumeral joint.  She states she was unable to move that shoulder for some time.  Pain in the shoulder does make it difficult for her to sleep.  Feels that Tylenol really has not helped much.  Review of systems: See HPI otherwise negative  Physical exam: General Well-developed well-nourished female no acute distress. Psych: Alert and oriented x 3 Right elbow good range of motion without pain.  Nontender over the medial lateral epicondyle region. Bilateral shoulders: She has 5 out of 5 strength with external/internal rotation against resistance.  Slight weakness with liftoff on the right and empty can test on the right.  Full overhead motion right shoulder.  Negative impingement sign on the right.  Tenderness right shoulder posterior glenohumeral joint line.  Cervical spine good range of motion without pain.  Nontender over the medial border of the scapula.   Impression: Right elbow lateral epicondylitis Cervicalgia Right shoulder pain  Plan due to the fact that she has no longer having any elbow pain or neck pain recommend she continue to work on range of motion strengthening right shoulder.  She has been to 3 visits with therapy as of yet and has seen improvement.  Optimistic that she will improve with further therapy.  If she gets to 6 weeks from today's date and still having significant pain in the shoulder she will follow-up with [place] most likely at that point time would recommend MRI of the right shoulder  to rule out internal derangement.  Questions were encouraged and answered at length.

## 2023-06-30 ENCOUNTER — Other Ambulatory Visit: Payer: Self-pay | Admitting: Internal Medicine

## 2023-07-01 ENCOUNTER — Encounter: Payer: Self-pay | Admitting: Physical Therapy

## 2023-07-01 ENCOUNTER — Ambulatory Visit: Payer: Medicare PPO | Admitting: Physical Therapy

## 2023-07-01 DIAGNOSIS — M542 Cervicalgia: Secondary | ICD-10-CM

## 2023-07-01 DIAGNOSIS — M25611 Stiffness of right shoulder, not elsewhere classified: Secondary | ICD-10-CM

## 2023-07-01 DIAGNOSIS — G8929 Other chronic pain: Secondary | ICD-10-CM | POA: Diagnosis not present

## 2023-07-01 DIAGNOSIS — M25511 Pain in right shoulder: Secondary | ICD-10-CM

## 2023-07-01 DIAGNOSIS — M6281 Muscle weakness (generalized): Secondary | ICD-10-CM | POA: Diagnosis not present

## 2023-07-01 NOTE — Therapy (Signed)
OUTPATIENT PHYSICAL THERAPY SHOULDER TREATMENT   Patient Name: Katrina Obrien MRN: [id number] DOB:1955-10-15, 67 y.o., female Today's Date: 07/01/2023  END OF SESSION:  PT End of Session - 07/01/23 1524     Visit Number 4    Number of Visits 13    Date for PT Re-Evaluation 07/27/23    Authorization Type [employer] MCR    Authorization Time Period 06/15/23 to 07/27/23    Progress Note Due on Visit 10    PT Start Time 1430    PT Stop Time 1513    PT Time Calculation (min) 43 min    Activity Tolerance Patient tolerated treatment well    Behavior During Therapy WFL for tasks assessed/performed               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   Clotting disorder (HCC)    anticoagulant - coumadin hx DVT, PE   Coronary artery disease    s/p 3- vessel bypass    Diabetes mellitus without complication (HCC)    DVT (deep venous thrombosis) (HCC)    Heart murmur    Hyperlipidemia    Hypertension    Ischemic cardiomyopathy    40 to 45%   Left bundle branch block    Mechanical heart valve present    Jan 2012   Popliteal artery occlusion, left (HCC)    recent  status  post angioplasty   Pulmonary embolism (HCC)    associated with DVT   Pulmonary embolus (HCC)    Thyroid disease    hyperthyroidism   Past Surgical History:  Procedure Laterality Date   AORTIC VALVE REPLACEMENT (AVR)/CORONARY ARTERY BYPASS GRAFTING (CABG)  08/16/2010  VAN TRIGT   AV replacement w/19-mm mechanical St Jude valve ([id number]),CABGX3  lima to LAD, SVG to distal circ,SVG to posterior descending   ASD REPAIR     CARDIAC CATHETERIZATION  08/08/2010   ,LAD 50% PROX,605 TANDEM SEGMENTAL STENOSIS,LEFT CIRC 60%-AV GROOVE, RGT COR 40% PROX ON THE BEND ,80% PROX PDA,   CORONARY ARTERY BYPASS GRAFT  08/16/2010   LIMA TO lad,svg to distal circ ,svg to posterior descending   DOPPLER  ECHOCARDIOGRAPHY  08/09/2010   EF35% to 40% ,bbicuspid ;severely  thickened ,severely calcified leaflets, LV normal   LEA doppler  08/07/2010   left popliteal occlusion ,and anterior tibial complete occlusion   NM MYOCAR PERF WALL MOTION  08/06/2010   EF 31%  LOW RISK SCAN   nuc     PV ANGIOGRAM  08/08/2010   successful PTA LEFT POPLITEAL   Patient Active Problem List   Diagnosis Date Noted   Restless leg syndrome 02/16/2023   Plantar fasciitis of right foot 06/16/2022   Diarrhea due to drug- metformin 06/08/2019   Caregiver stress syndrome 03/08/2019   Vitamin D insufficiency 09/13/2018   White coat syndrome with diagnosis of hypertension 09/13/2018   Hyperlipidemia associated with type 2 diabetes mellitus (HCC) 02/03/2018   Hypothyroidism 02/03/2018   LBBB (left bundle branch block) 04/06/2014   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   Aortic stenosis 01/26/2013   S/P AVR 01/26/2013   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   Hypertension associated with diabetes (HCC) 01/26/2013   Familial hyperlipidemia 01/26/2013   Cardiomyopathy, ischemic 01/26/2013   DVT (deep venous thrombosis) (HCC) 01/26/2013   Pulmonary embolus (HCC) 01/26/2013   Uncontrolled type 2 diabetes mellitus  with hyperglycemia (HCC) 01/26/2013   Long term (current) use of anticoagulants 10/11/2012    PCP: Saralyn Pilar PA-C   REFERRING PROVIDER: Kirtland Bouchard, PA-C  REFERRING DIAG: Diagnosis M54.12 (ICD-10-CM) - Radiculopathy, cervical region M77.11 (ICD-10-CM) - Lateral epicondylitis, right elbow M75.41 (ICD-10-CM) - Impingement syndrome of right shoulder  THERAPY DIAG:  Chronic right shoulder pain  Cervicalgia  Stiffness of right shoulder, not elsewhere classified  Muscle weakness (generalized)  Rationale for Evaluation and Treatment: Rehabilitation  ONSET DATE: about 3 months ago   SUBJECTIVE:                                                                                                                                                                                       SUBJECTIVE STATEMENT:  Dry needling seemed to help some, but its been a rough day at work. The other week when I was washing my hair my shoulder got stuck in that position, had severe pain on the top of my shoulder. I eventually got it down but it took some time. Saw Bronson Curb Monday, I do feel like we're headed in the right direction right now, want to avoid surgery and we agreed to continue PT for now.     EVAL: I was walking the dog and had a fall, I really fell hard on my right arm. Everything seemed to heal except for my wrist, elbow, and shoulder. Stepped on the side of the road wrong and fell, twisted my ankle and hit my head as well. Having ongoing aching in wrist/elbow/shoulder and its better, got a cortisone shot a couple of weeks ago and that is painful. 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 Having night pain, doesn't seem to matter if I lay on left or right sides but I do have to support my right arm with pillows. Having some right sided neck issues since the fall too, most of my pain is in the arm and shoulder.  Hand dominance: Right  PERTINENT HISTORY: See above   PAIN:  Are you having pain? Yes: NPRS scale: 4-5/10 Pain location: R shoulder soreness  Pain description: soreness/irritated  Aggravating factors: reaching, putting on coat, stress/tension Relieving factors: taking enough tylenol to address pain    PRECAUTIONS: None  RED FLAGS: None   WEIGHT BEARING RESTRICTIONS: No  FALLS:  Has patient fallen in last 6 months? Yes. 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  DIAGNOSTIC FINDINGS:  IMPRESSION: Mild degenerative change of the radiocarpal and thumb carpometacarpal joints.     IMPRESSION: Mild to moderate acromioclavicular and trace glenohumeral degenerative spurring.  PATIENT SURVEYS:  FOTO 50, predicted 69 in 9 visits   COGNITION: Overall cognitive status: Within functional limits for tasks assessed     SENSATION: Not tested  POSTURE: Rounded shoulders, forward head   UPPER EXTREMITY ROM:   Active ROM Right eval Left eval Right 07/01/23  Shoulder flexion 115* 128* 124*  Shoulder extension     Shoulder abduction 165* pain 175* 124* more painful that usual   Shoulder adduction     Shoulder internal rotation L1 T10 T8  Shoulder external rotation T4  T4 T4  Elbow flexion WNL  WNL    Elbow extension WNL  WNL    Wrist flexion WNL  WNL    Wrist extension WNL  WNL    Wrist ulnar deviation     Wrist radial deviation     Wrist pronation WNL  WNL    Wrist supination WNL  WNL    Cervical flexion  48* ---   Cervical extension  30* ---   Cervical lateral flexion  35* pain 42*   Cervical rotation  42* pain 70*   (Blank rows = not tested)  UPPER EXTREMITY MMT:  MMT Right eval Left eval  Shoulder flexion 3- 4+  Shoulder extension    Shoulder abduction 3- 4+  Shoulder adduction    Shoulder internal rotation 3- 4+  Shoulder external rotation 4 4+  Middle trapezius    Lower trapezius    Elbow flexion 4+ 4+  Elbow extension 4 4+  Wrist flexion    Wrist extension    Wrist ulnar deviation    Wrist radial deviation     Wrist pronation    Wrist supination    Grip strength (lbs)    (Blank rows = not tested)    PALPATION:  Tender R rotator cuff, upper trap and levator, R delts and pec    TODAY'S TREATMENT:                                                                                                                                         DATE:   07/01/23  Inferior and anterior shoulder apprehension tests both (-) ROM testing updated  TherEx  UBE L1.5 x6 minutes backwards Pec  minor stretch in door way 2x30 seconds Supine serratus punches 2# x12  Supine serratus CW/CCW circles 2# x10 each way Supine serratus alphabet x1 round  Rows red TB 15x3 second holds   Shoulder extensions red TB 15x3 second holds  Manual  STM R rotator cuff and rhomboids    06/23/23:  TherEx:  UBE: level 1 x 2 minutes each direction Rows: c green TB 2 x 10 holding 3 sec Rt shoulder ER c red TB 2 x 10  Shoulder extension red TB  2 x 10  Manual:  Skilled palpation to active trigger points Trigger Point Dry-Needling  Treatment instructions: Expect mild to moderate muscle soreness. S/S of pneumothorax if dry needled over a lung field, and to seek immediate medical attention should they occur. Patient verbalized understanding of these instructions and education.  Patient Consent Given: Yes Education handout provided: Yes Muscles treated: left deltoid Electrical stimulation performed: No Parameters: N/A Treatment response/outcome: twitch response noted, pt with good response to treatment  Moist heat x 5 minutes following DN   06/19/23  UBE L1x6 minutes backwards only, all 4 extremities  Supine AAROM flexion 1# 10x3 second holds  Supine AAROM ABD 1# 10x3 second holds Seated AAROM ER 1# 10x3 second holds  Pec stretch in corner 3x30 seconds  Isometrics on wall: shoulder flexion 10x3 second holds, ABD 10x3 second holds, ER 10x3 second holds Scap retractions red TB x12       06/15/23- Eval, POC,  HEP  Scap retractions red TB Shoulder extensions red TB Shoulder flexion stretch on door Backward shoulder rolls UBE L1 x6 minutes backwards      PATIENT EDUCATION: Education details: exam findings, POC, HEP, role of PT and exercise in managing pain and improving posture/function  Person educated: Patient Education method: Explanation, Demonstration, and Handouts Education comprehension: verbalized understanding, returned demonstration, and needs further education  HOME EXERCISE PROGRAM: Access Code: [email or web address] URL: [email or web address] Date: 06/23/2023 Prepared by: Narda Amber  Exercises - Scapular Retraction with Resistance  - 1 x daily - 7 x weekly - 2 sets - 10 reps - 2 seconds  hold - Shoulder extension with resistance - Neutral  - 1 x daily - 7 x weekly - 2 sets - 10 reps - 2 seconds  hold - Standing Single Arm Shoulder Flexion Stretch on Wall  - 1 x daily - 7 x weekly - 2 sets - 10 reps - 5 seconds  hold - Standing Backward Shoulder Rolls  - 1 x daily - 7 x weekly - 2 sets - 10 reps - Shoulder External Rotation with Anchored Resistance with Towel Under Elbow  - 1-2 x daily - 7 x weekly - 2 sets - 10 reps  ASSESSMENT:  CLINICAL IMPRESSION:  07/01/2023  Pt arrives today doing OK, still having quite a bit of shoulder pain but feeling better. Checked inferior and anterior apprehension tests out of caution given shoulder "getting stuck", both tests negative- suspect that maybe that incident was pain from mm spasm or tendon impingement? Continued with functional ROM and strengthening PRN, also keep working on soft tissue spasms today as tolerated.      EVAL: Patient is a 67 y.o. F who was seen today for physical therapy evaluation and treatment for Diagnosis M54.12 (ICD-10-CM) - Radiculopathy, cervical region M77.11 (ICD-10-CM) - Lateral epicondylitis, right elbow M75.41 (ICD-10-CM) - Impingement syndrome of right shoulder. Exam with objective findings as  above, right now her main concern is shoulder pain as the other involved joints  have considerably improved, but will continue to monitor. Will benefit from balance screen given that this injury was the result of a fall.    OBJECTIVE IMPAIRMENTS: decreased mobility, decreased ROM, decreased strength, hypomobility, increased fascial restrictions, increased muscle spasms, impaired flexibility, impaired UE functional use, postural dysfunction, and pain.   ACTIVITY LIMITATIONS: carrying, lifting, bathing, toileting, dressing, reach over head, hygiene/grooming, and caring for others  PARTICIPATION LIMITATIONS: meal prep, cleaning, laundry, shopping, community activity, occupation, yard work, and church  PERSONAL FACTORS: Age, Behavior pattern, Education, Financial risk analyst, Past/current experiences, Profession, Sex, Social background, and Time since onset of injury/illness/exacerbation are also affecting patient's functional outcome.   REHAB POTENTIAL: Good  CLINICAL DECISION MAKING: Stable/uncomplicated  EVALUATION COMPLEXITY: Low   GOALS: Goals reviewed with patient? No  SHORT TERM GOALS: Target date: 07/06/2023    Will be compliant with appropriate progressive HEP  Baseline: Goal status: On-going 06/23/23  2.  AROM R shoulder to be equal to that of the left and pain free  Baseline:  Goal status:on-going 06/23/23  3.  Cervical AROM to be symmetrical and pain free  Baseline:  Goal status:on-going 06/23/23  4.  Will demonstrate improved awareness of posture with functional task performance and use of postural aides PRN  Baseline:  Goal status: 06/23/23   LONG TERM GOALS: Target date: 07/27/2023    MMT to be equal and symmetrical in BUEs  Baseline:  Goal status: INITIAL  2.  Pain to be no more than 2/10 at worst R UE  Baseline:  Goal status: INITIAL  3.  Will score at least 20/24 on DGI to show reduced fall risk  Baseline:  Goal status: INITIAL  4.  Will be able to perform all  functional work and recreational tasks without increase from resting pain levels including reaching overhead and lifting chairs at work  Baseline:  Goal status: INITIAL  5.  FOTO score to be within 5 points of predicted by time of DC  Baseline:  Goal status: INITIAL    PLAN:  PT FREQUENCY: 2x/week  PT DURATION: 6 weeks  PLANNED INTERVENTIONS: 97164- PT Re-evaluation, 97110-Therapeutic exercises, 97530- Therapeutic activity, 97112- Neuromuscular re-education, 97535- Self Care, 24401- Manual therapy, 97014- Electrical stimulation (unattended), Patient/Family education, Balance training, Dry Needling  PLAN FOR NEXT SESSION: shoulder and cervical ROM and strength, postural training. Needs DGI done at some point for balance assessment/initial data point. Repeat DN as able, this was helpful    Nedra Hai, PT, DPT 07/01/23 3:25 PM   07/01/23 3:25 PM   Referring diagnosis? Diagnosis M54.12 (ICD-10-CM) - Radiculopathy, cervical region M77.11 (ICD-10-CM) - Lateral epicondylitis, right elbow M75.41 (ICD-10-CM) - Impingement syndrome of right shoulder  Treatment diagnosis? (if different than referring diagnosis) M25.511, G89.29, M54.2, M25.611, M62.81   What was this (referring dx) caused by? []  Surgery [x]  Fall []  Ongoing issue []  Arthritis []  Other: ____________  Laterality: [x]  Rt []  Lt []  Both  Check all possible CPT codes:  *CHOOSE 10 OR LESS*    See Planned Interventions listed in the Plan section of the Evaluation.

## 2023-07-03 ENCOUNTER — Encounter: Payer: Medicare PPO | Admitting: Physical Therapy

## 2023-07-06 ENCOUNTER — Ambulatory Visit: Payer: Medicare PPO | Admitting: Physical Therapy

## 2023-07-06 ENCOUNTER — Encounter: Payer: Self-pay | Admitting: Physical Therapy

## 2023-07-06 DIAGNOSIS — M542 Cervicalgia: Secondary | ICD-10-CM | POA: Diagnosis not present

## 2023-07-06 DIAGNOSIS — G8929 Other chronic pain: Secondary | ICD-10-CM | POA: Diagnosis not present

## 2023-07-06 DIAGNOSIS — M25511 Pain in right shoulder: Secondary | ICD-10-CM | POA: Diagnosis not present

## 2023-07-06 DIAGNOSIS — M25611 Stiffness of right shoulder, not elsewhere classified: Secondary | ICD-10-CM | POA: Diagnosis not present

## 2023-07-06 DIAGNOSIS — M6281 Muscle weakness (generalized): Secondary | ICD-10-CM | POA: Diagnosis not present

## 2023-07-06 NOTE — Therapy (Signed)
OUTPATIENT PHYSICAL THERAPY SHOULDER TREATMENT   Patient Name: Katrina Obrien MRN: [id number] DOB:[birth year], 67 y.o., female Today's Date: 07/06/2023  END OF SESSION:  PT End of Session - 07/06/23 1442     Visit Number 5    Number of Visits 13    Date for PT Re-Evaluation 07/27/23    Authorization Type [employer] MCR    Authorization Time Period 06/15/23 to 07/27/23    Progress Note Due on Visit 10    PT Start Time 1433    PT Stop Time 1513    PT Time Calculation (min) 40 min    Activity Tolerance Patient tolerated treatment well    Behavior During Therapy [facility] for tasks assessed/performed                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   Clotting disorder (HCC)    anticoagulant - coumadin hx DVT, PE   Coronary artery disease    s/p 3- vessel bypass    Diabetes mellitus without complication (HCC)    DVT (deep venous thrombosis) (HCC)    Heart murmur    Hyperlipidemia    Hypertension    Ischemic cardiomyopathy    40 to 45%   Left bundle branch block    Mechanical heart valve present    Jan 2012   Popliteal artery occlusion, left (HCC)    recent  status  post angioplasty   Pulmonary embolism (HCC)    associated with DVT   Pulmonary embolus (HCC)    Thyroid disease    hyperthyroidism   Past Surgical History:  Procedure Laterality Date   AORTIC VALVE REPLACEMENT (AVR)/CORONARY ARTERY BYPASS GRAFTING (CABG)  08/16/2010  VAN TRIGT   AV replacement w/19-mm mechanical St Jude valve ([id number]),CABGX3  lima to LAD, SVG to distal circ,SVG to posterior descending   ASD REPAIR     CARDIAC CATHETERIZATION  08/08/2010   ,LAD 50% PROX,605 TANDEM SEGMENTAL STENOSIS,LEFT CIRC 60%-AV GROOVE, RGT COR 40% PROX ON THE BEND ,80% PROX PDA,   CORONARY ARTERY BYPASS GRAFT  08/16/2010   LIMA TO lad,svg to distal circ ,svg to posterior descending    DOPPLER ECHOCARDIOGRAPHY  08/09/2010   EF35% to 40% ,bbicuspid ;severely  thickened ,severely calcified leaflets, LV normal   LEA doppler  08/07/2010   left popliteal occlusion ,and anterior tibial complete occlusion   NM MYOCAR PERF WALL MOTION  08/06/2010   EF 31%  LOW RISK SCAN   nuc     PV ANGIOGRAM  08/08/2010   successful PTA LEFT POPLITEAL   Patient Active Problem List   Diagnosis Date Noted   Restless leg syndrome 02/16/2023   Plantar fasciitis of right foot 06/16/2022   Diarrhea due to drug- metformin 06/08/2019   Caregiver stress syndrome 03/08/2019   Vitamin D insufficiency 09/13/2018   White coat syndrome with diagnosis of hypertension 09/13/2018   Hyperlipidemia associated with type 2 diabetes mellitus (HCC) 02/03/2018   Hypothyroidism 02/03/2018   LBBB (left bundle branch block) 04/06/2014   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   Aortic stenosis 01/26/2013   S/P AVR 01/26/2013   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   Hypertension associated with diabetes (HCC) 01/26/2013   Familial hyperlipidemia 01/26/2013   Cardiomyopathy, ischemic 01/26/2013   DVT (deep venous thrombosis) (HCC) 01/26/2013   Pulmonary embolus (HCC) 01/26/2013   Uncontrolled type 2 diabetes  mellitus with hyperglycemia (HCC) 01/26/2013   Long term (current) use of anticoagulants 10/11/2012    PCP: Saralyn Pilar PA-C   REFERRING PROVIDER: Kirtland Bouchard, PA-C  REFERRING DIAG: Diagnosis M54.12 (ICD-10-CM) - Radiculopathy, cervical region M77.11 (ICD-10-CM) - Lateral epicondylitis, right elbow M75.41 (ICD-10-CM) - Impingement syndrome of right shoulder  THERAPY DIAG:  Chronic right shoulder pain  Cervicalgia  Stiffness of right shoulder, not elsewhere classified  Muscle weakness (generalized)  Rationale for Evaluation and Treatment: Rehabilitation  ONSET DATE: about 3 months ago   SUBJECTIVE:                                                                                                                                                                                       SUBJECTIVE STATEMENT:  I was sore after last time, but it was like a workout sore so I think it was good. It was a good workout. No other big flare ups like the other week, work has been crazy at school.     EVAL: I was walking the dog and had a fall, I really fell hard on my right arm. Everything seemed to heal except for my wrist, elbow, and shoulder. Stepped on the side of the road wrong and fell, twisted my ankle and hit my head as well. Having ongoing aching in wrist/elbow/shoulder and its better, got a cortisone shot a couple of weeks ago and that is painful. 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 Having night pain, doesn't seem to matter if I lay on left or right sides but I do have to support my right arm with pillows. Having some right sided neck issues since the fall too, most of my pain is in the arm and shoulder.  Hand dominance: Right  PERTINENT HISTORY: See above   PAIN:  Are you having pain? Yes: NPRS scale: 4-5/10 Pain location: R shoulder soreness  Pain description: soreness/irritated  Aggravating factors: reaching, putting on coat, stress/tension Relieving factors: taking enough tylenol to address pain    PRECAUTIONS: None  RED FLAGS: None   WEIGHT BEARING RESTRICTIONS: No  FALLS:  Has patient fallen in last 6 months? Yes. 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  DIAGNOSTIC FINDINGS:  IMPRESSION: Mild degenerative change of the radiocarpal and thumb carpometacarpal joints.     IMPRESSION: Mild to moderate acromioclavicular and trace glenohumeral degenerative spurring.  PATIENT SURVEYS:  FOTO 50, predicted 69 in 9 visits   COGNITION: Overall cognitive status: Within functional limits for tasks assessed     SENSATION: Not tested  POSTURE: Rounded shoulders, forward head   UPPER EXTREMITY ROM:   Active ROM Right eval Left eval Right 07/01/23  Shoulder flexion 115* 128* 124*  Shoulder extension     Shoulder abduction 165* pain 175* 124* more painful that usual   Shoulder adduction     Shoulder internal rotation L1 T10 T8  Shoulder external rotation T4  T4 T4  Elbow flexion WNL  WNL    Elbow extension WNL  WNL    Wrist flexion WNL  WNL    Wrist extension WNL  WNL    Wrist ulnar deviation     Wrist radial deviation     Wrist pronation WNL  WNL    Wrist supination WNL  WNL    Cervical flexion  48* ---   Cervical extension  30* ---   Cervical lateral flexion  35* pain 42*   Cervical rotation  42* pain 70*   (Blank rows = not tested)  UPPER EXTREMITY MMT:  MMT Right eval Left eval  Shoulder flexion 3- 4+  Shoulder extension    Shoulder abduction 3- 4+  Shoulder adduction    Shoulder internal rotation 3- 4+  Shoulder external rotation 4 4+  Middle trapezius    Lower trapezius    Elbow flexion 4+ 4+  Elbow extension 4 4+  Wrist flexion    Wrist extension    Wrist ulnar deviation    Wrist radial deviation    Wrist pronation    Wrist supination    Grip strength (lbs)    (Blank rows = not tested)    PALPATION:  Tender R rotator cuff, upper trap and levator, R delts and pec    TODAY'S  TREATMENT:                                                                                                                                         DATE:   07/06/23  TherEx  UBE all 4 extremities L1.5 backwards x6 minutes Serratus punches 2# x15 (to HEP) Serratus CW/CCW circles x15 each 2# (to HEP) Serratus alphabet x2 2# Shoulder isometrics: flexion x10, ABD x10, extensions x10 Rows green TB x10 3 second holds  Shoulder extensions yellow TB x10 3 second holds  Shoulder flexion using  BUEs with 1# bar x5-  scap retraction  and back of head to wall for good alignment Supine shoulder ABD AAROM 1# rod x10 to just below shoulder height x10 Thoracic extensions + light shoulder ABD/ER for pec mobility x10      07/01/23  Inferior and anterior shoulder apprehension tests both (-) ROM testing updated  TherEx  UBE L1.5 x6 minutes backwards Pec minor stretch in door way 2x30 seconds Supine serratus punches 2# x12  Supine serratus CW/CCW circles 2# x10 each way Supine serratus alphabet x1 round  Rows red TB 15x3 second holds   Shoulder extensions red TB 15x3 second holds  Manual  STM R rotator cuff and rhomboids    06/23/23:  TherEx:  UBE: level 1 x 2 minutes each direction Rows: c green TB 2 x 10 holding 3 sec Rt shoulder ER c red TB 2 x 10  Shoulder extension red TB  2 x 10  Manual:  Skilled palpation to active trigger points Trigger Point Dry-Needling  Treatment instructions: Expect mild to moderate muscle soreness. S/S of pneumothorax if dry needled over a lung field, and to seek immediate medical attention should they occur. Patient verbalized understanding of these instructions and education.  Patient Consent Given: Yes Education handout provided: Yes Muscles treated: left deltoid Electrical stimulation performed: No Parameters: N/A Treatment response/outcome: twitch response noted, pt with good response to treatment  Moist heat x 5 minutes following  DN   06/19/23  UBE L1x6 minutes backwards only, all 4 extremities  Supine AAROM flexion 1# 10x3 second holds  Supine AAROM ABD 1# 10x3 second holds Seated AAROM ER 1# 10x3 second holds  Pec stretch in corner 3x30 seconds  Isometrics on wall: shoulder flexion 10x3 second holds, ABD 10x3 second holds, ER 10x3 second holds Scap retractions red TB x12       06/15/23- Eval, POC, HEP  Scap retractions red TB Shoulder extensions red TB Shoulder flexion stretch on door Backward shoulder rolls UBE L1 x6 minutes backwards      PATIENT EDUCATION: Education details: exam findings, POC, HEP, role of PT and exercise in managing pain and improving posture/function  Person educated: Patient Education method: Explanation, Demonstration, and Handouts Education comprehension: verbalized understanding, returned demonstration, and needs further education  HOME EXERCISE PROGRAM: Access Code: [email or web address] URL: [email or web address] Date: 06/23/2023 Prepared by: Narda Amber  Exercises - Scapular Retraction with Resistance  - 1 x daily - 7 x weekly - 2 sets - 10 reps - 2 seconds  hold - Shoulder extension with resistance - Neutral  - 1 x daily - 7 x weekly - 2 sets - 10 reps - 2 seconds  hold - Standing Single Arm Shoulder Flexion Stretch on Wall  - 1 x daily - 7 x weekly - 2 sets - 10 reps - 5 seconds  hold - Standing Backward Shoulder Rolls  - 1 x daily - 7 x weekly - 2 sets - 10 reps - Shoulder External Rotation with Anchored Resistance with Towel Under Elbow  - 1-2 x daily - 7 x weekly - 2 sets - 10 reps  ASSESSMENT:  CLINICAL IMPRESSION:  07/06/2023  Pt arrives doing alright today, shoulder was really sore after last visit but "it was a good sore like from exercise". Continued focus on periscapular musculature and postural strengthening/stretching today as there seems to be quite a bit of benefit to be had here. Will continue to progress as able/tolerated. Pain down to  1-2/10 at EOS.  EVAL: Patient is a 67 y.o. F who was seen today for physical therapy evaluation and treatment for Diagnosis M54.12 (ICD-10-CM) - Radiculopathy, cervical region M77.11 (ICD-10-CM) - Lateral epicondylitis, right elbow M75.41 (ICD-10-CM) - Impingement syndrome of right shoulder. Exam with objective findings as above, right now her main concern is shoulder pain as the other involved joints have considerably improved, but will continue to monitor. Will benefit from balance screen given that this injury was the result of a fall.    OBJECTIVE IMPAIRMENTS: decreased mobility, decreased ROM, decreased strength, hypomobility, increased fascial restrictions, increased muscle spasms, impaired flexibility, impaired UE functional use, postural dysfunction, and pain.   ACTIVITY LIMITATIONS: carrying, lifting, bathing, toileting, dressing, reach over head, hygiene/grooming, and caring for others  PARTICIPATION LIMITATIONS: meal prep, cleaning, laundry, shopping, community activity, occupation, yard work, and church  PERSONAL FACTORS: Age, Behavior pattern, Education, Financial risk analyst, Past/current experiences, Profession, Sex, Social background, and Time since onset of injury/illness/exacerbation are also affecting patient's functional outcome.   REHAB POTENTIAL: Good  CLINICAL DECISION MAKING: Stable/uncomplicated  EVALUATION COMPLEXITY: Low   GOALS: Goals reviewed with patient? No  SHORT TERM GOALS: Target date: 07/06/2023    Will be compliant with appropriate progressive HEP  Baseline: Goal status: On-going 06/23/23  2.  AROM R shoulder to be equal to that of the left and pain free  Baseline:  Goal status:on-going 06/23/23  3.  Cervical AROM to be symmetrical and pain free  Baseline:  Goal status:on-going 06/23/23  4.  Will demonstrate improved awareness of posture with functional task performance and use of postural aides PRN  Baseline:  Goal status: 06/23/23   LONG TERM  GOALS: Target date: 07/27/2023    MMT to be equal and symmetrical in BUEs  Baseline:  Goal status: INITIAL  2.  Pain to be no more than 2/10 at worst R UE  Baseline:  Goal status: INITIAL  3.  Will score at least 20/24 on DGI to show reduced fall risk  Baseline:  Goal status: INITIAL  4.  Will be able to perform all functional work and recreational tasks without increase from resting pain levels including reaching overhead and lifting chairs at work  Baseline:  Goal status: INITIAL  5.  FOTO score to be within 5 points of predicted by time of DC  Baseline:  Goal status: INITIAL    PLAN:  PT FREQUENCY: 2x/week  PT DURATION: 6 weeks  PLANNED INTERVENTIONS: 97164- PT Re-evaluation, 97110-Therapeutic exercises, 97530- Therapeutic activity, 97112- Neuromuscular re-education, 97535- Self Care, 53664- Manual therapy, 97014- Electrical stimulation (unattended), Patient/Family education, Balance training, Dry Needling  PLAN FOR NEXT SESSION: shoulder and cervical ROM and strength, postural training. Repeat DN as able, this was helpful. Hold off on balance, she is feeling good about this    Nedra Hai, PT, DPT 07/06/23 3:14 PM   07/06/23 3:14 PM   Referring diagnosis? Diagnosis M54.12 (ICD-10-CM) - Radiculopathy, cervical region M77.11 (ICD-10-CM) - Lateral epicondylitis, right elbow M75.41 (ICD-10-CM) - Impingement syndrome of right shoulder  Treatment diagnosis? (if different than referring diagnosis) M25.511, G89.29, M54.2, M25.611, M62.81   What was this (referring dx) caused by? []  Surgery [x]  Fall []  Ongoing issue []  Arthritis []  Other: ____________  Laterality: [x]  Rt []  Lt []  Both  Check all possible CPT codes:  *CHOOSE 10 OR LESS*    See Planned Interventions listed in the Plan section of the Evaluation.

## 2023-07-07 ENCOUNTER — Ambulatory Visit: Payer: Medicare PPO | Attending: Cardiovascular Disease

## 2023-07-07 DIAGNOSIS — Z7901 Long term (current) use of anticoagulants: Secondary | ICD-10-CM

## 2023-07-07 LAB — POCT INR: INR: 4.2 — AB (ref 2.0–3.0)

## 2023-07-07 NOTE — Patient Instructions (Signed)
HOLD TONIGHT ONLY THEN Continue taking warfarin 1 tablet daily except 1.5 tablets each Monday, Wednesday and Friday. Repeat INR in 4 weeks. [facility] [phone]

## 2023-07-08 ENCOUNTER — Encounter: Payer: Self-pay | Admitting: Physical Therapy

## 2023-07-08 ENCOUNTER — Ambulatory Visit: Payer: Medicare PPO | Admitting: Physical Therapy

## 2023-07-08 DIAGNOSIS — M25611 Stiffness of right shoulder, not elsewhere classified: Secondary | ICD-10-CM | POA: Diagnosis not present

## 2023-07-08 DIAGNOSIS — M25511 Pain in right shoulder: Secondary | ICD-10-CM | POA: Diagnosis not present

## 2023-07-08 DIAGNOSIS — M542 Cervicalgia: Secondary | ICD-10-CM

## 2023-07-08 DIAGNOSIS — G8929 Other chronic pain: Secondary | ICD-10-CM

## 2023-07-08 DIAGNOSIS — M6281 Muscle weakness (generalized): Secondary | ICD-10-CM
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
## 2023-07-13 ENCOUNTER — Ambulatory Visit: Payer: Medicare PPO | Admitting: Physical Therapy

## 2023-07-13 ENCOUNTER — Encounter: Payer: Self-pay | Admitting: Physical Therapy

## 2023-07-13 DIAGNOSIS — M542 Cervicalgia: Secondary | ICD-10-CM | POA: Diagnosis not present

## 2023-07-13 DIAGNOSIS — M25511 Pain in right shoulder: Secondary | ICD-10-CM | POA: Diagnosis not present

## 2023-07-13 DIAGNOSIS — G8929 Other chronic pain: Secondary | ICD-10-CM

## 2023-07-13 DIAGNOSIS — M25611 Stiffness of right shoulder, not elsewhere classified: Secondary | ICD-10-CM | POA: Diagnosis not present

## 2023-07-13 DIAGNOSIS — M6281 Muscle weakness (generalized): Secondary | ICD-10-CM | POA: Diagnosis not present

## 2023-07-13 NOTE — Therapy (Signed)
OUTPATIENT PHYSICAL THERAPY SHOULDER TREATMENT   Patient Name: Katrina Obrien MRN: [id number] DOB:1956/04/17, 67 y.o., female Today's Date: 07/13/2023  END OF SESSION:  PT End of Session - 07/13/23 1502     Visit Number 7    Number of Visits 13    Date for PT Re-Evaluation 07/27/23    Authorization Type [employer] MCR    Authorization Time Period 06/15/23 to 07/27/23    Progress Note Due on Visit 10    PT Start Time 1431    PT Stop Time 1509    PT Time Calculation (min) 38 min    Activity Tolerance Patient tolerated treatment well    Behavior During Therapy [facility] for tasks assessed/performed                  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   Clotting disorder (HCC)    anticoagulant - coumadin hx DVT, PE   Coronary artery disease    s/p 3- vessel bypass    Diabetes mellitus without complication (HCC)    DVT (deep venous thrombosis) (HCC)    Heart murmur    Hyperlipidemia    Hypertension    Ischemic cardiomyopathy    40 to 45%   Left bundle branch block    Mechanical heart valve present    Jan 2012   Popliteal artery occlusion, left (HCC)    recent  status  post angioplasty   Pulmonary embolism (HCC)    associated with DVT   Pulmonary embolus (HCC)    Thyroid disease    hyperthyroidism   Past Surgical History:  Procedure Laterality Date   AORTIC VALVE REPLACEMENT (AVR)/CORONARY ARTERY BYPASS GRAFTING (CABG)  08/16/2010  VAN TRIGT   AV replacement w/19-mm mechanical St Jude valve ([id number]),CABGX3  lima to LAD, SVG to distal circ,SVG to posterior descending   ASD REPAIR     CARDIAC CATHETERIZATION  08/08/2010   ,LAD 50% PROX,605 TANDEM SEGMENTAL STENOSIS,LEFT CIRC 60%-AV GROOVE, RGT COR 40% PROX ON THE BEND ,80% PROX PDA,   CORONARY ARTERY BYPASS GRAFT  08/16/2010   LIMA TO lad,svg to distal circ ,svg to posterior descending    DOPPLER ECHOCARDIOGRAPHY  08/09/2010   EF35% to 40% ,bbicuspid ;severely  thickened ,severely calcified leaflets, LV normal   LEA doppler  08/07/2010   left popliteal occlusion ,and anterior tibial complete occlusion   NM MYOCAR PERF WALL MOTION  08/06/2010   EF 31%  LOW RISK SCAN   nuc     PV ANGIOGRAM  08/08/2010   successful PTA LEFT POPLITEAL   Patient Active Problem List   Diagnosis Date Noted   Restless leg syndrome 02/16/2023   Plantar fasciitis of right foot 06/16/2022   Diarrhea due to drug- metformin 06/08/2019   Caregiver stress syndrome 03/08/2019   Vitamin D insufficiency 09/13/2018   White coat syndrome with diagnosis of hypertension 09/13/2018   Hyperlipidemia associated with type 2 diabetes mellitus (HCC) 02/03/2018   Hypothyroidism 02/03/2018   LBBB (left bundle branch block) 04/06/2014   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   Aortic stenosis 01/26/2013   S/P AVR 01/26/2013   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   Hypertension associated with diabetes (HCC) 01/26/2013   Familial hyperlipidemia 01/26/2013   Cardiomyopathy, ischemic 01/26/2013   DVT (deep venous thrombosis) (HCC) 01/26/2013   Pulmonary embolus (HCC) 01/26/2013   Uncontrolled type  2 diabetes mellitus with hyperglycemia (HCC) 01/26/2013   Long term (current) use of anticoagulants 10/11/2012    PCP: Saralyn Pilar PA-C   REFERRING PROVIDER: Kirtland Bouchard, PA-C  REFERRING DIAG: Diagnosis M54.12 (ICD-10-CM) - Radiculopathy, cervical region M77.11 (ICD-10-CM) - Lateral epicondylitis, right elbow M75.41 (ICD-10-CM) - Impingement syndrome of right shoulder  THERAPY DIAG:  Chronic right shoulder pain  Cervicalgia  Stiffness of right shoulder, not elsewhere classified  Muscle weakness (generalized)  Rationale for Evaluation and Treatment: Rehabilitation  ONSET DATE: about 3 months ago   SUBJECTIVE:                                                                                                                                                                                       SUBJECTIVE STATEMENT:  Usually the night after PT I'm sore but the next day is OK. Felt like going and working out. I did something that was really good-  able to put plates away in [facility] cabinet without pain, didn't really realize what I did until I actually finished doing it. Not afraid to reach up and take something out microwave at this point. Would give myself a 70/100 right now with biggest concern still being pain when lifting out to the side      EVAL: I was walking the dog and had a fall, I really fell hard on my right arm. Everything seemed to heal except for my wrist, elbow, and shoulder. Stepped on the side of the road wrong and fell, twisted my ankle and hit my head as well. Having ongoing aching in wrist/elbow/shoulder and its better, got a cortisone shot a couple of weeks ago and that is painful. 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 Having night pain, doesn't seem to matter if I lay on left or right sides but I do have to support my right arm with pillows. Having some right sided neck issues since the fall too, most of my pain is in the arm and shoulder.  Hand dominance: Right  PERTINENT HISTORY: See above   PAIN:  Are you having pain? No 0/10 not even soreness    PRECAUTIONS: None  RED FLAGS: None   WEIGHT BEARING RESTRICTIONS: No  FALLS:  Has patient fallen in last 6 months? Yes. 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  DIAGNOSTIC FINDINGS:  IMPRESSION: Mild degenerative change of the radiocarpal and thumb carpometacarpal joints.     IMPRESSION: Mild to moderate acromioclavicular and trace glenohumeral degenerative spurring.  PATIENT SURVEYS:  FOTO 50, predicted 69 in 9 visits   COGNITION: Overall cognitive status: Within functional limits for tasks assessed     SENSATION: Not tested  POSTURE: Rounded shoulders, forward head   UPPER EXTREMITY ROM:   Active ROM Right eval Left eval Right 07/01/23 AROM 07/08/23 AROM  Shoulder flexion 115* 128* 124*   Shoulder extension      Shoulder abduction 165* pain 175* 124* more painful that usual    Shoulder adduction      Shoulder internal rotation L1 T10 T8   Shoulder external rotation T4  T4 T4   Elbow flexion WNL  WNL     Elbow extension WNL  WNL     Wrist flexion WNL  WNL     Wrist extension WNL  WNL     Wrist ulnar deviation      Wrist radial deviation      Wrist pronation WNL  WNL     Wrist supination WNL  WNL     Cervical flexion  48* ---  60*  Cervical extension  30* ---  44*  Cervical lateral flexion  35* pain 42*  R 30* L 33*  Cervical rotation  42* pain 70*  R 60* L 68*  (Blank rows = not tested)  UPPER EXTREMITY MMT:  MMT Right eval Left eval Right 07/08/23 Left 07/08/23  Shoulder flexion 3- 4+ 3+ 4+  Shoulder extension      Shoulder abduction 3- 4+ 3+ 4+  Shoulder adduction      Shoulder internal rotation 3- 4+ 4+ 5  Shoulder external rotation 4 4+ 4+ 5  Middle trapezius      Lower trapezius      Elbow flexion 4+ 4+ 5 5  Elbow extension 4 4+ 4+ 5  Wrist flexion      Wrist extension      Wrist ulnar deviation      Wrist radial deviation       Wrist pronation      Wrist supination      Grip strength (lbs)      (Blank rows = not tested)    PALPATION:  Tender R rotator cuff, upper trap and levator, R delts and pec    TODAY'S TREATMENT:  DATE:    07/13/23  TherEx  UBE L2 x8 minutes backwards only all 4 extremities  Supine shoulder flexion 2# 2x12 Sidelying shoulder ABD to 90* 2# 2x12 Sidelying shoulder ER 2# 2x12 Rows green TB 20x3 second holds  Shoulder extensions 12x3 seconds green TB 3 way reaches on wall yellow TB 2x5 B   07/08/23  TherEx  UBE L1.5 x8 minutes, backwards only all four extremities  MMT and cervical ROM recheck- see charts above Supine shoulder flexion 1# 2x10 Sidelying shoulder ABD to 90* 1# 2x10 Rows green TB 15x2 second holds  Shoulder extensions red TB 10x2 second holds  Sidelying R shoulder ER 2# 2x10 towel tucked under elbow Shoulder flexion stretch up wall ladder 12x5 second holds (to slot 18) Upper trap stretch 2x30 seconds B       07/06/23  TherEx  UBE all 4 extremities L1.5 backwards x6 minutes Serratus punches 2# x15 (to HEP) Serratus CW/CCW circles x15 each 2# (to HEP) Serratus alphabet x2 2# Shoulder isometrics: flexion x10, ABD x10, extensions x10 Rows green TB x10 3 second holds  Shoulder extensions yellow TB x10 3 second holds  Shoulder flexion using BUEs with 1# bar x5-  scap retraction  and back of head to wall for good alignment Supine shoulder ABD AAROM 1# rod x10 to just below shoulder height x10 Thoracic extensions + light shoulder ABD/ER for pec mobility x10      07/01/23  Inferior and anterior shoulder apprehension tests both (-) ROM testing updated  TherEx  UBE L1.5 x6 minutes backwards Pec minor stretch in door way 2x30 seconds Supine serratus punches 2# x12  Supine serratus CW/CCW circles 2# x10 each  way Supine serratus alphabet x1 round  Rows red TB 15x3 second holds   Shoulder extensions red TB 15x3 second holds  Manual  STM R rotator cuff and rhomboids    06/23/23:  TherEx:  UBE: level 1 x 2 minutes each direction Rows: c green TB 2 x 10 holding 3 sec Rt shoulder ER c red TB 2 x 10  Shoulder extension red TB  2 x 10  Manual:  Skilled palpation to active trigger points Trigger Point Dry-Needling  Treatment instructions: Expect mild to moderate muscle soreness. S/S of pneumothorax if dry needled over a lung field, and to seek immediate medical attention should they occur. Patient verbalized understanding of these instructions and education.  Patient Consent Given: Yes Education handout provided: Yes Muscles treated: left deltoid Electrical stimulation performed: No Parameters: N/A Treatment response/outcome: twitch response noted, pt with good response to treatment  Moist heat x 5 minutes following DN   06/19/23  UBE L1x6 minutes backwards only, all 4 extremities  Supine AAROM flexion 1# 10x3 second holds  Supine AAROM ABD 1# 10x3 second holds Seated AAROM ER 1# 10x3 second holds  Pec stretch in corner 3x30 seconds  Isometrics on wall: shoulder flexion 10x3 second holds, ABD 10x3 second holds, ER 10x3 second holds Scap retractions red TB x12       06/15/23- Eval, POC, HEP  Scap retractions red TB Shoulder extensions red TB Shoulder flexion stretch on door Backward shoulder rolls UBE L1 x6 minutes backwards      PATIENT EDUCATION: Education details: exam findings, POC, HEP, role of PT and exercise in managing pain and improving posture/function  Person educated: Patient Education method: Explanation, Demonstration, and Handouts Education comprehension: verbalized understanding, returned demonstration, and needs further education  HOME EXERCISE PROGRAM: Access Code: [email or web address] URL: [email or web address] Date: 07/13/2023  Prepared by:  Nedra Hai  Exercises - Scapular Retraction with Resistance  - 1 x daily - 7 x weekly - 2 sets - 10 reps - 2 seconds  hold - Shoulder extension with resistance - Neutral  - 1 x daily - 7 x weekly - 2 sets - 10 reps - 2 seconds  hold - Standing Single Arm Shoulder Flexion Stretch on Wall  - 1 x daily - 7 x weekly - 2 sets - 10 reps - 5 seconds  hold - Standing Backward Shoulder Rolls  - 1 x daily - 7 x weekly - 2 sets - 10 reps - Shoulder External Rotation with Anchored Resistance with Towel Under Elbow  - 1-2 x daily - 7 x weekly - 2 sets - 10 reps - Single Arm Serratus Punches in Supine with Dumbbell  - 1 x daily - 7 x weekly - 1-2 sets - 10 reps - Supine Shoulder Circles with Weight  - 1 x daily - 7 x weekly - 1-2 sets - 10 reps - Supine Shoulder Flexion with Free Weight  - 1 x daily - 7 x weekly - 2 sets - 10 reps - Sidelying Shoulder Abduction Palm Forward  - 1 x daily - 7 x weekly - 2 sets - 10 reps - Sidelying Shoulder ER with Towel and Dumbbell  - 1 x daily - 7 x weekly - 2 sets - 10 reps  ASSESSMENT:  CLINICAL IMPRESSION:  07/13/2023  Pt arrives today doing very well, sounds like she is really making excellent progress functionally at home. Strengthening has been really beneficial for her, we progressed this in session today and also updated her HEP as appropriate. Will continue to advance tissue challenges and promote functional gains as able.      EVAL: Patient is a 67 y.o. F who was seen today for physical therapy evaluation and treatment for Diagnosis M54.12 (ICD-10-CM) - Radiculopathy, cervical region M77.11 (ICD-10-CM) - Lateral epicondylitis, right elbow M75.41 (ICD-10-CM) - Impingement syndrome of right shoulder. Exam with objective findings as above, right now her main concern is shoulder pain as the other involved joints have considerably improved, but will continue to monitor. Will benefit from balance screen given that this injury was the result of a fall.     OBJECTIVE IMPAIRMENTS: decreased mobility, decreased ROM, decreased strength, hypomobility, increased fascial restrictions, increased muscle spasms, impaired flexibility, impaired UE functional use, postural dysfunction, and pain.   ACTIVITY LIMITATIONS: carrying, lifting, bathing, toileting, dressing, reach over head, hygiene/grooming, and caring for others  PARTICIPATION LIMITATIONS: meal prep, cleaning, laundry, shopping, community activity, occupation, yard work, and church  PERSONAL FACTORS: Age, Behavior pattern, Education, Financial risk analyst, Past/current experiences, Profession, Sex, Social background, and Time since onset of injury/illness/exacerbation are also affecting patient's functional outcome.   REHAB POTENTIAL: Good  CLINICAL DECISION MAKING: Stable/uncomplicated  EVALUATION COMPLEXITY: Low   GOALS: Goals reviewed with patient? No  SHORT TERM GOALS: Target date: 07/06/2023    Will be compliant with appropriate progressive HEP  Baseline: Goal status: MET 07/08/23  2.  AROM R shoulder to be equal to that of the left and pain free  Baseline:  Goal status: ONGOING 07/08/23  3.  Cervical AROM to be symmetrical and pain free  Baseline:  Goal status: PARTIALLY MET 07/08/23  4.  Will demonstrate improved awareness of posture with functional task performance and use of postural aides PRN  Baseline:  Goal status: ONGOING 07/08/23   LONG TERM GOALS: Target date: 07/27/2023  MMT to be equal and symmetrical in BUEs  Baseline:  Goal status: ONGOING 07/08/23  2.  Pain to be no more than 2/10 at worst R UE  Baseline:  Goal status: ONGOING 07/08/23  3.  Will score at least 20/24 on DGI to show reduced fall risk  Baseline:  Goal status: DEFERRED 07/08/23 pt with no further balance concerns   4.  Will be able to perform all functional work and recreational tasks without increase from resting pain levels including reaching overhead and lifting chairs at work  Baseline:   Goal status: ONGOING 07/08/23  5.  FOTO score to be within 5 points of predicted by time of DC  Baseline:  Goal status: ONGOING 07/08/23    PLAN:  PT FREQUENCY: 2x/week  PT DURATION: 6 weeks  PLANNED INTERVENTIONS: 97164- PT Re-evaluation, 97110-Therapeutic exercises, 97530- Therapeutic activity, 97112- Neuromuscular re-education, 97535- Self Care, 25366- Manual therapy, 97014- Electrical stimulation (unattended), Patient/Family education, Balance training, Dry Needling  PLAN FOR NEXT SESSION: shoulder and cervical ROM and strength, postural training. Repeat DN as able, this was helpful. Hold off on balance/focus on shoulder. Update FOTO next visit    Nedra Hai, PT, DPT 07/13/23 3:08 PM   07/13/23 3:08 PM   Referring diagnosis? Diagnosis M54.12 (ICD-10-CM) - Radiculopathy, cervical region M77.11 (ICD-10-CM) - Lateral epicondylitis, right elbow M75.41 (ICD-10-CM) - Impingement syndrome of right shoulder  Treatment diagnosis? (if different than referring diagnosis) M25.511, G89.29, M54.2, M25.611, M62.81   What was this (referring dx) caused by? []  Surgery [x]  Fall []  Ongoing issue []  Arthritis []  Other: ____________  Laterality: [x]  Rt []  Lt []  Both  Check all possible CPT codes:  *CHOOSE 10 OR LESS*    See Planned Interventions listed in the Plan section of the Evaluation.

## 2023-07-15 ENCOUNTER — Encounter: Payer: Self-pay | Admitting: Physical Therapy

## 2023-07-15 ENCOUNTER — Ambulatory Visit: Payer: Medicare PPO | Admitting: Physical Therapy

## 2023-07-15 DIAGNOSIS — M25511 Pain in right shoulder: Secondary | ICD-10-CM | POA: Diagnosis not present

## 2023-07-15 DIAGNOSIS — G8929 Other chronic pain: Secondary | ICD-10-CM | POA: Diagnosis not present

## 2023-07-15 DIAGNOSIS — M25611 Stiffness of right shoulder, not elsewhere classified: Secondary | ICD-10-CM

## 2023-07-15 DIAGNOSIS — M6281 Muscle weakness (generalized): Secondary | ICD-10-CM | POA: Diagnosis not present

## 2023-07-15 DIAGNOSIS — M542 Cervicalgia: Secondary | ICD-10-CM

## 2023-07-15 NOTE — Therapy (Signed)
OUTPATIENT PHYSICAL THERAPY SHOULDER TREATMENT   Patient Name: Katrina Obrien MRN: [id number] DOB:[birth year], 67 y.o., female Today's Date: 07/15/2023  END OF SESSION:  PT End of Session - 07/15/23 1441     Visit Number 8    Number of Visits 13    Date for PT Re-Evaluation 07/27/23    Authorization Type [employer] MCR    Authorization Time Period 06/15/23 to 07/27/23    Progress Note Due on Visit 10    PT Start Time 1433    PT Stop Time 1511    PT Time Calculation (min) 38 min    Activity Tolerance Patient tolerated treatment well    Behavior During Therapy [facility] for tasks assessed/performed                   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   Clotting disorder (HCC)    anticoagulant - coumadin hx DVT, PE   Coronary artery disease    s/p 3- vessel bypass    Diabetes mellitus without complication (HCC)    DVT (deep venous thrombosis) (HCC)    Heart murmur    Hyperlipidemia    Hypertension    Ischemic cardiomyopathy    40 to 45%   Left bundle branch block    Mechanical heart valve present    Jan 2012   Popliteal artery occlusion, left (HCC)    recent  status  post angioplasty   Pulmonary embolism (HCC)    associated with DVT   Pulmonary embolus (HCC)    Thyroid disease    hyperthyroidism   Past Surgical History:  Procedure Laterality Date   AORTIC VALVE REPLACEMENT (AVR)/CORONARY ARTERY BYPASS GRAFTING (CABG)  08/16/2010  VAN TRIGT   AV replacement w/19-mm mechanical St Jude valve ([id number]),CABGX3  lima to LAD, SVG to distal circ,SVG to posterior descending   ASD REPAIR     CARDIAC CATHETERIZATION  08/08/2010   ,LAD 50% PROX,605 TANDEM SEGMENTAL STENOSIS,LEFT CIRC 60%-AV GROOVE, RGT COR 40% PROX ON THE BEND ,80% PROX PDA,   CORONARY ARTERY BYPASS GRAFT  08/16/2010   LIMA TO lad,svg to distal circ ,svg to posterior descending    DOPPLER ECHOCARDIOGRAPHY  08/09/2010   EF35% to 40% ,bbicuspid ;severely  thickened ,severely calcified leaflets, LV normal   LEA doppler  08/07/2010   left popliteal occlusion ,and anterior tibial complete occlusion   NM MYOCAR PERF WALL MOTION  08/06/2010   EF 31%  LOW RISK SCAN   nuc     PV ANGIOGRAM  08/08/2010   successful PTA LEFT POPLITEAL   Patient Active Problem List   Diagnosis Date Noted   Restless leg syndrome 02/16/2023   Plantar fasciitis of right foot 06/16/2022   Diarrhea due to drug- metformin 06/08/2019   Caregiver stress syndrome 03/08/2019   Vitamin D insufficiency 09/13/2018   White coat syndrome with diagnosis of hypertension 09/13/2018   Hyperlipidemia associated with type 2 diabetes mellitus (HCC) 02/03/2018   Hypothyroidism 02/03/2018   LBBB (left bundle branch block) 04/06/2014   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   Aortic stenosis 01/26/2013   S/P AVR 01/26/2013   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   Hypertension associated with diabetes (HCC) 01/26/2013   Familial hyperlipidemia 01/26/2013   Cardiomyopathy, ischemic 01/26/2013   DVT (deep venous thrombosis) (HCC) 01/26/2013   Pulmonary embolus (HCC) 01/26/2013   Uncontrolled  type 2 diabetes mellitus with hyperglycemia (HCC) 01/26/2013   Long term (current) use of anticoagulants 10/11/2012    PCP: Saralyn Pilar PA-C   REFERRING PROVIDER: Kirtland Bouchard, PA-C  REFERRING DIAG: Diagnosis M54.12 (ICD-10-CM) - Radiculopathy, cervical region M77.11 (ICD-10-CM) - Lateral epicondylitis, right elbow M75.41 (ICD-10-CM) - Impingement syndrome of right shoulder  THERAPY DIAG:  Chronic right shoulder pain  Cervicalgia  Stiffness of right shoulder, not elsewhere classified  Muscle weakness (generalized)  Rationale for Evaluation and Treatment: Rehabilitation  ONSET DATE: about 3 months ago   SUBJECTIVE:                                                                                                                                                                                       SUBJECTIVE STATEMENT:    Feeling good, 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 Shoulder is feeling good, nothing new     EVAL: I was walking the dog and had a fall, I really fell hard on my right arm. Everything seemed to heal except for my wrist, elbow, and shoulder. Stepped on the side of the road wrong and fell, twisted my ankle and hit my head as well. Having ongoing aching in wrist/elbow/shoulder and its better, got a cortisone shot a couple of weeks ago and that is painful. 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 Having night pain, doesn't seem to matter if I lay on left or right sides but I do have to support my right arm with pillows. Having some right sided neck issues since the fall too, most of my pain is in the arm and shoulder.  Hand dominance: Right  PERTINENT HISTORY: See above   PAIN:  Are you having pain? No 0/10 now, had some post-exercise pain Monday night/Tuesday morning   PRECAUTIONS: None  RED FLAGS: None   WEIGHT BEARING RESTRICTIONS: No  FALLS:  Has patient fallen in last 6 months? Yes. 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  DIAGNOSTIC FINDINGS:  IMPRESSION: Mild degenerative change of the radiocarpal and thumb carpometacarpal joints.     IMPRESSION: Mild to moderate acromioclavicular and trace glenohumeral degenerative spurring.  PATIENT SURVEYS:  FOTO 50, predicted 40 in 9 visits; 07/15/23- 64.5    COGNITION: Overall cognitive status: Within functional limits for tasks assessed     SENSATION: Not tested  POSTURE: Rounded shoulders, forward head   UPPER EXTREMITY ROM:   Active ROM Right eval Left eval Right 07/01/23 AROM 07/08/23 AROM  Shoulder flexion 115* 128* 124*   Shoulder extension      Shoulder abduction 165* pain 175* 124* more painful that usual    Shoulder adduction      Shoulder internal rotation L1 T10 T8   Shoulder external rotation T4  T4 T4   Elbow flexion WNL  WNL     Elbow extension WNL  WNL     Wrist flexion WNL  WNL     Wrist extension WNL  WNL     Wrist ulnar deviation      Wrist radial deviation      Wrist pronation WNL  WNL     Wrist supination WNL  WNL     Cervical flexion  48* ---  60*  Cervical extension  30* ---  44*  Cervical lateral flexion  35* pain 42*  R 30* L 33*  Cervical rotation  42* pain 70*  R 60* L 68*  (Blank rows = not tested)  UPPER EXTREMITY MMT:  MMT Right eval Left eval Right 07/08/23 Left 07/08/23  Shoulder flexion 3- 4+ 3+ 4+  Shoulder extension      Shoulder abduction 3- 4+ 3+ 4+  Shoulder adduction      Shoulder internal rotation 3- 4+ 4+ 5  Shoulder external rotation 4 4+ 4+ 5  Middle trapezius      Lower trapezius      Elbow flexion 4+ 4+ 5 5  Elbow extension 4 4+ 4+ 5  Wrist flexion      Wrist extension      Wrist ulnar deviation      Wrist radial deviation      Wrist pronation      Wrist supination      Grip strength (lbs)      (Blank rows = not tested)    PALPATION:  Tender R rotator cuff, upper trap and levator, R delts and pec    TODAY'S TREATMENT:                                                                                                                                          DATE:   07/15/23  TherEx  UBE L2 x8 minutes backwards only all 4 extremities  FOTO update  Supine shoulder flexion 2# 2x15 RUE  Sidelying shoulder ABD through tolerated  ROM 2# 2x12 RUE  Sidelying shoulder ER 3# 2x10  Thoracic 3D excursions all directions x20 Attempted horizontal ADD stretch R shoulder- pain limited  Repeated horizontal ADD x12 with short holds, much more tolerable      BATCA UE resistance training:  - rows 10# x10  - lat pulls 10# x10  - tricep curls 20# x10  - bicep curls 10# x10      07/13/23  TherEx  UBE L2 x8 minutes backwards only all 4 extremities  Supine shoulder flexion 2# 2x12 Sidelying shoulder ABD to 90* 2# 2x12 Sidelying shoulder ER 2# 2x12 Rows green TB 20x3 second holds  Shoulder extensions 12x3 seconds green TB 3 way reaches on wall yellow TB 2x5 B   07/08/23  TherEx  UBE L1.5 x8 minutes, backwards only all four extremities  MMT and cervical ROM recheck- see charts above Supine shoulder flexion 1# 2x10 Sidelying shoulder ABD to 90* 1# 2x10 Rows green TB 15x2 second holds  Shoulder extensions red TB 10x2 second holds  Sidelying R shoulder ER 2# 2x10 towel tucked under elbow Shoulder flexion stretch up wall ladder 12x5 second holds (to slot 18) Upper trap stretch 2x30 seconds B       07/06/23  TherEx  UBE all 4 extremities L1.5 backwards x6 minutes Serratus punches 2# x15 (to HEP) Serratus CW/CCW circles x15 each 2# (to HEP) Serratus alphabet x2 2# Shoulder isometrics: flexion x10, ABD x10, extensions x10 Rows green TB x10 3 second holds  Shoulder extensions yellow TB x10 3 second holds  Shoulder flexion using BUEs with 1# bar x5-  scap retraction  and back of head to wall for good alignment Supine shoulder ABD AAROM 1# rod x10 to just below shoulder height x10 Thoracic extensions + light shoulder ABD/ER for pec mobility x10       07/01/23  Inferior and anterior shoulder apprehension tests both (-) ROM testing updated  TherEx  UBE L1.5 x6 minutes backwards Pec minor stretch in door way 2x30 seconds Supine serratus punches 2# x12  Supine serratus CW/CCW circles 2# x10 each way Supine serratus alphabet x1 round  Rows red TB 15x3 second holds   Shoulder extensions red TB 15x3 second holds  Manual  STM R rotator cuff and rhomboids    06/23/23:  TherEx:  UBE: level 1 x 2 minutes each direction Rows: c green TB 2 x 10 holding 3 sec Rt shoulder ER c red TB 2 x 10  Shoulder extension red TB  2 x 10  Manual:  Skilled palpation to active trigger points Trigger Point Dry-Needling  Treatment instructions: Expect mild to moderate muscle soreness. S/S of pneumothorax if dry needled over a lung field, and to seek immediate medical attention should they occur. Patient verbalized understanding of these instructions and education.  Patient Consent Given: Yes Education handout provided: Yes Muscles treated: left deltoid Electrical stimulation performed: No Parameters: N/A Treatment response/outcome: twitch response noted, pt with good response to treatment  Moist heat x 5 minutes following DN   06/19/23  UBE L1x6 minutes backwards only, all 4 extremities  Supine AAROM flexion 1# 10x3 second holds  Supine AAROM ABD 1# 10x3 second holds Seated AAROM ER 1# 10x3 second holds  Pec stretch in corner 3x30 seconds  Isometrics on wall: shoulder flexion 10x3 second holds, ABD 10x3 second holds, ER 10x3 second holds Scap retractions red TB x12       06/15/23- Eval, POC, HEP  Scap retractions red TB Shoulder extensions red  TB Shoulder flexion stretch on door Backward shoulder rolls UBE L1 x6 minutes backwards      PATIENT EDUCATION: Education details: exam findings, POC, HEP, role of PT and exercise in managing pain and improving posture/function  Person educated: Patient Education method:  Explanation, Demonstration, and Handouts Education comprehension: verbalized understanding, returned demonstration, and needs further education  HOME EXERCISE PROGRAM: Access Code: [email or web address] URL: [email or web address] Date: 07/13/2023 Prepared by: Nedra Hai  Exercises - Scapular Retraction with Resistance  - 1 x daily - 7 x weekly - 2 sets - 10 reps - 2 seconds  hold - Shoulder extension with resistance - Neutral  - 1 x daily - 7 x weekly - 2 sets - 10 reps - 2 seconds  hold - Standing Single Arm Shoulder Flexion Stretch on Wall  - 1 x daily - 7 x weekly - 2 sets - 10 reps - 5 seconds  hold - Standing Backward Shoulder Rolls  - 1 x daily - 7 x weekly - 2 sets - 10 reps - Shoulder External Rotation with Anchored Resistance with Towel Under Elbow  - 1-2 x daily - 7 x weekly - 2 sets - 10 reps - Single Arm Serratus Punches in Supine with Dumbbell  - 1 x daily - 7 x weekly - 1-2 sets - 10 reps - Supine Shoulder Circles with Weight  - 1 x daily - 7 x weekly - 1-2 sets - 10 reps - Supine Shoulder Flexion with Free Weight  - 1 x daily - 7 x weekly - 2 sets - 10 reps - Sidelying Shoulder Abduction Palm Forward  - 1 x daily - 7 x weekly - 2 sets - 10 reps - Sidelying Shoulder ER with Towel and Dumbbell  - 1 x daily - 7 x weekly - 2 sets - 10 reps  ASSESSMENT:  CLINICAL IMPRESSION:  07/15/2023  Pt arrives today doing well, sounds like she tolerated last session well with some post-exercise soreness as expected. Replicated that workout fairly closely today, don't want to progress her too quickly and cause re-aggravation of pain.     EVAL: Patient is a 67 y.o. F who was seen today for physical therapy evaluation and treatment for Diagnosis M54.12 (ICD-10-CM) - Radiculopathy, cervical region M77.11 (ICD-10-CM) - Lateral epicondylitis, right elbow M75.41 (ICD-10-CM) - Impingement syndrome of right shoulder. Exam with objective findings as above, right now her main concern is shoulder  pain as the other involved joints have considerably improved, but will continue to monitor. Will benefit from balance screen given that this injury was the result of a fall.    OBJECTIVE IMPAIRMENTS: decreased mobility, decreased ROM, decreased strength, hypomobility, increased fascial restrictions, increased muscle spasms, impaired flexibility, impaired UE functional use, postural dysfunction, and pain.   ACTIVITY LIMITATIONS: carrying, lifting, bathing, toileting, dressing, reach over head, hygiene/grooming, and caring for others  PARTICIPATION LIMITATIONS: meal prep, cleaning, laundry, shopping, community activity, occupation, yard work, and church  PERSONAL FACTORS: Age, Behavior pattern, Education, Financial risk analyst, Past/current experiences, Profession, Sex, Social background, and Time since onset of injury/illness/exacerbation are also affecting patient's functional outcome.   REHAB POTENTIAL: Good  CLINICAL DECISION MAKING: Stable/uncomplicated  EVALUATION COMPLEXITY: Low   GOALS: Goals reviewed with patient? No  SHORT TERM GOALS: Target date: 07/06/2023    Will be compliant with appropriate progressive HEP  Baseline: Goal status: MET 07/08/23  2.  AROM R shoulder to be equal to that of the left and pain free  Baseline:  Goal status: ONGOING 07/08/23  3.  Cervical AROM to be symmetrical and pain free  Baseline:  Goal status: PARTIALLY MET 07/08/23  4.  Will demonstrate improved awareness of posture with functional task performance and use of postural aides PRN  Baseline:  Goal status: ONGOING 07/08/23   LONG TERM GOALS: Target date: 07/27/2023    MMT to be equal and symmetrical in BUEs  Baseline:  Goal status: ONGOING 07/08/23  2.  Pain to be no more than 2/10 at worst R UE  Baseline:  Goal status: ONGOING 07/08/23  3.  Will score at least 20/24 on DGI to show reduced fall risk  Baseline:  Goal status: DEFERRED 07/08/23 pt with no further balance concerns   4.   Will be able to perform all functional work and recreational tasks without increase from resting pain levels including reaching overhead and lifting chairs at work  Baseline:  Goal status: ONGOING 07/08/23  5.  FOTO score to be within 5 points of predicted by time of DC  Baseline:  Goal status: ONGOING 07/08/23    PLAN:  PT FREQUENCY: 2x/week  PT DURATION: 6 weeks  PLANNED INTERVENTIONS: 97164- PT Re-evaluation, 97110-Therapeutic exercises, 97530- Therapeutic activity, 97112- Neuromuscular re-education, 97535- Self Care, 30865- Manual therapy, 97014- Electrical stimulation (unattended), Patient/Family education, Balance training, Dry Needling  PLAN FOR NEXT SESSION: shoulder and cervical ROM and strength, postural training. Repeat DN as able, this was helpful. Hold off on balance/focus on shoulder. Work on posterior capsule flexibility/stretching   Nedra Hai, PT, DPT 07/15/23 3:12 PM   07/15/23 3:12 PM   Referring diagnosis? Diagnosis M54.12 (ICD-10-CM) - Radiculopathy, cervical region M77.11 (ICD-10-CM) - Lateral epicondylitis, right elbow M75.41 (ICD-10-CM) - Impingement syndrome of right shoulder  Treatment diagnosis? (if different than referring diagnosis) M25.511, G89.29, M54.2, M25.611, M62.81   What was this (referring dx) caused by? []  Surgery [x]  Fall []  Ongoing issue []  Arthritis []  Other: ____________  Laterality: [x]  Rt []  Lt []  Both  Check all possible CPT codes:  *CHOOSE 10 OR LESS*    See Planned Interventions listed in the Plan section of the Evaluation.

## 2023-07-20 ENCOUNTER — Encounter: Payer: Self-pay | Admitting: Physical Therapy

## 2023-07-20 ENCOUNTER — Ambulatory Visit: Payer: Medicare PPO | Admitting: Physical Therapy

## 2023-07-20 ENCOUNTER — Other Ambulatory Visit: Payer: Self-pay | Admitting: Internal Medicine

## 2023-07-20 DIAGNOSIS — M542 Cervicalgia: Secondary | ICD-10-CM

## 2023-07-20 DIAGNOSIS — G8929 Other chronic pain: Secondary | ICD-10-CM | POA: Diagnosis not present

## 2023-07-20 DIAGNOSIS — M25511 Pain in right shoulder: Secondary | ICD-10-CM

## 2023-07-20 DIAGNOSIS — M25611 Stiffness of right shoulder, not elsewhere classified: Secondary | ICD-10-CM | POA: Diagnosis not present

## 2023-07-20 DIAGNOSIS — M6281 Muscle weakness (generalized): Secondary | ICD-10-CM

## 2023-07-20 NOTE — Therapy (Signed)
OUTPATIENT PHYSICAL THERAPY SHOULDER TREATMENT   Patient Name: Katrina Obrien MRN: [id number] DOB:1956-07-22, 67 y.o., female Today's Date: 07/20/2023  END OF SESSION:  PT End of Session - 07/20/23 1435     Visit Number 9    Number of Visits 13    Date for PT Re-Evaluation 07/27/23    Authorization Type [employer] MCR    Authorization Time Period 06/15/23 to 07/27/23    Progress Note Due on Visit 10    PT Start Time 1433    PT Stop Time 1512    PT Time Calculation (min) 39 min    Activity Tolerance Patient tolerated treatment well    Behavior During Therapy [facility] for tasks assessed/performed                    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   Clotting disorder (HCC)    anticoagulant - coumadin hx DVT, PE   Coronary artery disease    s/p 3- vessel bypass    Diabetes mellitus without complication (HCC)    DVT (deep venous thrombosis) (HCC)    Heart murmur    Hyperlipidemia    Hypertension    Ischemic cardiomyopathy    40 to 45%   Left bundle branch block    Mechanical heart valve present    Jan 2012   Popliteal artery occlusion, left (HCC)    recent  status  post angioplasty   Pulmonary embolism (HCC)    associated with DVT   Pulmonary embolus (HCC)    Thyroid disease    hyperthyroidism   Past Surgical History:  Procedure Laterality Date   AORTIC VALVE REPLACEMENT (AVR)/CORONARY ARTERY BYPASS GRAFTING (CABG)  08/16/2010  VAN TRIGT   AV replacement w/19-mm mechanical St Jude valve ([id number]),CABGX3  lima to LAD, SVG to distal circ,SVG to posterior descending   ASD REPAIR     CARDIAC CATHETERIZATION  08/08/2010   ,LAD 50% PROX,605 TANDEM SEGMENTAL STENOSIS,LEFT CIRC 60%-AV GROOVE, RGT COR 40% PROX ON THE BEND ,80% PROX PDA,   CORONARY ARTERY BYPASS GRAFT  08/16/2010   LIMA TO lad,svg to distal circ ,svg to posterior descending    DOPPLER ECHOCARDIOGRAPHY  08/09/2010   EF35% to 40% ,bbicuspid ;severely  thickened ,severely calcified leaflets, LV normal   LEA doppler  08/07/2010   left popliteal occlusion ,and anterior tibial complete occlusion   NM MYOCAR PERF WALL MOTION  08/06/2010   EF 31%  LOW RISK SCAN   nuc     PV ANGIOGRAM  08/08/2010   successful PTA LEFT POPLITEAL   Patient Active Problem List   Diagnosis Date Noted   Restless leg syndrome 02/16/2023   Plantar fasciitis of right foot 06/16/2022   Diarrhea due to drug- metformin 06/08/2019   Caregiver stress syndrome 03/08/2019   Vitamin D insufficiency 09/13/2018   White coat syndrome with diagnosis of hypertension 09/13/2018   Hyperlipidemia associated with type 2 diabetes mellitus (HCC) 02/03/2018   Hypothyroidism 02/03/2018   LBBB (left bundle branch block) 04/06/2014   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   Aortic stenosis 01/26/2013   S/P AVR 01/26/2013   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   Hypertension associated with diabetes (HCC) 01/26/2013   Familial hyperlipidemia 01/26/2013   Cardiomyopathy, ischemic 01/26/2013   DVT (deep venous thrombosis) (HCC) 01/26/2013   Pulmonary embolus (HCC) 01/26/2013  Uncontrolled type 2 diabetes mellitus with hyperglycemia (HCC) 01/26/2013   Long term (current) use of anticoagulants 10/11/2012    PCP: Saralyn Pilar PA-C   REFERRING PROVIDER: Kirtland Bouchard, PA-C  REFERRING DIAG: Diagnosis M54.12 (ICD-10-CM) - Radiculopathy, cervical region M77.11 (ICD-10-CM) - Lateral epicondylitis, right elbow M75.41 (ICD-10-CM) - Impingement syndrome of right shoulder  THERAPY DIAG:  Chronic right shoulder pain  Cervicalgia  Stiffness of right shoulder, not elsewhere classified  Muscle weakness (generalized)  Rationale for Evaluation and Treatment: Rehabilitation  ONSET DATE: about 3 months ago   SUBJECTIVE:                                                                                                                                                                                       SUBJECTIVE STATEMENT:   Feeling OK, that last stretch we did last week aggravated the shoulder a bit and I took it easy the past few days. Feeling upset about what happened on [place] this morning. Have been really busy and probably doing more than I should be, had to haul a lot of groceries up and down steps this morning.     EVAL: I was walking the dog and had a fall, I really fell hard on my right arm. Everything seemed to heal except for my wrist, elbow, and shoulder. Stepped on the side of the road wrong and fell, twisted my ankle and hit my head as well. Having ongoing aching in wrist/elbow/shoulder and its better, got a cortisone shot a couple of weeks ago and that is painful. 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 Having night pain, doesn't seem to matter if I lay on left or right sides but I do have to support my right arm with pillows. Having some right sided neck issues since the fall too, most of my pain is in the arm and shoulder.  Hand dominance: Right  PERTINENT HISTORY: See above   PAIN:  Are you having pain? Yes: NPRS scale: 3/10, to 5/10 at worst this week  Pain location: top of R shoulder  Pain description: sore  Aggravating factors: posterior capsule stretch from last visit, doing too much  Relieving factors: heat and relaxing the area   PRECAUTIONS: None  RED FLAGS: None   WEIGHT BEARING RESTRICTIONS: No  FALLS:  Has patient fallen in last 6 months? Yes. 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  DIAGNOSTIC FINDINGS:  IMPRESSION: Mild degenerative change of the radiocarpal and thumb carpometacarpal joints.     IMPRESSION: Mild to moderate acromioclavicular and trace glenohumeral degenerative spurring.  PATIENT SURVEYS:  FOTO 50, predicted 58 in 9 visits; 07/15/23- 64.5    COGNITION: Overall cognitive status: Within functional limits for tasks assessed     SENSATION: Not tested  POSTURE: Rounded shoulders, forward head   UPPER EXTREMITY ROM:   Active ROM Right eval Left eval Right 07/01/23 AROM 07/08/23 AROM  Shoulder flexion 115* 128* 124*   Shoulder extension      Shoulder abduction 165* pain 175* 124* more painful that usual    Shoulder adduction      Shoulder internal rotation L1 T10 T8   Shoulder external rotation T4  T4 T4   Elbow flexion WNL  WNL     Elbow extension WNL  WNL     Wrist flexion WNL  WNL     Wrist extension WNL  WNL     Wrist ulnar deviation      Wrist radial deviation      Wrist pronation WNL  WNL     Wrist supination WNL  WNL     Cervical flexion  48* ---  60*  Cervical extension  30* ---  44*  Cervical lateral flexion  35* pain 42*  R 30* L 33*  Cervical rotation  42* pain 70*  R 60* L 68*  (Blank rows = not tested)  UPPER EXTREMITY MMT:  MMT Right eval Left eval Right 07/08/23 Left 07/08/23  Shoulder flexion 3- 4+ 3+ 4+  Shoulder extension      Shoulder abduction 3- 4+ 3+ 4+  Shoulder adduction      Shoulder internal rotation 3- 4+ 4+ 5  Shoulder external rotation 4 4+ 4+ 5  Middle trapezius      Lower trapezius      Elbow flexion 4+ 4+ 5 5  Elbow  extension 4 4+ 4+ 5  Wrist flexion      Wrist extension      Wrist ulnar deviation      Wrist radial deviation      Wrist pronation      Wrist supination      Grip strength (lbs)      (Blank rows = not tested)    PALPATION:  Tender R rotator cuff, upper trap and levator, R delts and pec    TODAY'S TREATMENT:  DATE:   07/20/23  TherEx  UBE 2.5 x8 minutes backwards only [place] 6 x10 each 0# prone  Thoracic excursions x20  Thoracic extensions over double towel roll x10 with BUE flexion    On BACTA weight machine:  - rows 10# x15, 15# x7 - lat pulls 10# x15, 15# x7 - tricep curls 20# x15, 25# x7 - bicep curls 10# x15, 15# x7       07/15/23  TherEx  UBE L2 x8 minutes backwards only all 4 extremities  FOTO update  Supine shoulder flexion 2# 2x15 RUE  Sidelying shoulder ABD through tolerated ROM 2# 2x12 RUE  Sidelying shoulder ER 3# 2x10  Thoracic 3D excursions all directions x20 Attempted horizontal ADD stretch R shoulder- pain limited  Repeated horizontal ADD x12 with short holds, much more tolerable      BATCA UE resistance training:  - rows 10# x10  - lat pulls 10# x10  - tricep curls 20# x10  - bicep curls 10# x10      07/13/23  TherEx  UBE L2 x8 minutes backwards only all 4 extremities  Supine shoulder flexion 2# 2x12 Sidelying shoulder ABD to 90* 2# 2x12 Sidelying shoulder ER 2# 2x12 Rows green TB 20x3 second holds  Shoulder extensions 12x3 seconds green TB 3 way reaches on wall yellow TB 2x5 B   07/08/23  TherEx  UBE L1.5 x8 minutes, backwards only all four extremities  MMT and cervical ROM recheck- see charts above Supine shoulder flexion 1# 2x10 Sidelying shoulder ABD to 90* 1# 2x10 Rows green TB 15x2 second holds  Shoulder extensions red TB 10x2 second holds  Sidelying R shoulder ER 2# 2x10  towel tucked under elbow Shoulder flexion stretch up wall ladder 12x5 second holds (to slot 18) Upper trap stretch 2x30 seconds B       07/06/23  TherEx  UBE all 4 extremities L1.5 backwards x6 minutes Serratus punches 2# x15 (to HEP) Serratus CW/CCW circles x15 each 2# (to HEP) Serratus alphabet x2 2# Shoulder isometrics: flexion x10, ABD x10, extensions x10 Rows green TB x10 3 second holds  Shoulder extensions yellow TB x10 3 second holds  Shoulder flexion using BUEs with 1# bar x5-  scap retraction  and back of head to wall for good alignment Supine shoulder ABD AAROM 1# rod x10 to just below shoulder height x10 Thoracic extensions + light shoulder ABD/ER for pec mobility x10      07/01/23  Inferior and anterior shoulder apprehension tests both (-) ROM testing updated  TherEx  UBE L1.5 x6 minutes backwards Pec minor stretch in door way 2x30 seconds Supine serratus punches 2# x12  Supine serratus CW/CCW circles 2# x10 each way Supine serratus alphabet x1 round  Rows red TB 15x3 second holds   Shoulder extensions red TB 15x3 second holds  Manual  STM R rotator cuff and rhomboids    06/23/23:  TherEx:  UBE: level 1 x 2 minutes each direction Rows: c green TB 2 x 10 holding 3 sec Rt shoulder ER c red TB 2 x 10  Shoulder extension red TB  2 x 10  Manual:  Skilled palpation to active trigger points Trigger Point Dry-Needling  Treatment instructions: Expect mild to moderate muscle soreness. S/S of pneumothorax if dry needled over a lung field, and to seek immediate medical attention should they occur. Patient verbalized understanding of these instructions and education.  Patient Consent Given: Yes Education handout provided: Yes Muscles treated: left deltoid Electrical stimulation performed:  No Parameters: N/A Treatment response/outcome: twitch response noted, pt with good response to treatment  Moist heat x 5 minutes following DN   06/19/23  UBE L1x6  minutes backwards only, all 4 extremities  Supine AAROM flexion 1# 10x3 second holds  Supine AAROM ABD 1# 10x3 second holds Seated AAROM ER 1# 10x3 second holds  Pec stretch in corner 3x30 seconds  Isometrics on wall: shoulder flexion 10x3 second holds, ABD 10x3 second holds, ER 10x3 second holds Scap retractions red TB x12       06/15/23- Eval, POC, HEP  Scap retractions red TB Shoulder extensions red TB Shoulder flexion stretch on door Backward shoulder rolls UBE L1 x6 minutes backwards      PATIENT EDUCATION: Education details: exam findings, POC, HEP, role of PT and exercise in managing pain and improving posture/function  Person educated: Patient Education method: Explanation, Demonstration, and Handouts Education comprehension: verbalized understanding, returned demonstration, and needs further education  HOME EXERCISE PROGRAM: Access Code: [email or web address] URL: [email or web address] Date: 07/13/2023 Prepared by: Nedra Hai  Exercises - Scapular Retraction with Resistance  - 1 x daily - 7 x weekly - 2 sets - 10 reps - 2 seconds  hold - Shoulder extension with resistance - Neutral  - 1 x daily - 7 x weekly - 2 sets - 10 reps - 2 seconds  hold - Standing Single Arm Shoulder Flexion Stretch on Wall  - 1 x daily - 7 x weekly - 2 sets - 10 reps - 5 seconds  hold - Standing Backward Shoulder Rolls  - 1 x daily - 7 x weekly - 2 sets - 10 reps - Shoulder External Rotation with Anchored Resistance with Towel Under Elbow  - 1-2 x daily - 7 x weekly - 2 sets - 10 reps - Single Arm Serratus Punches in Supine with Dumbbell  - 1 x daily - 7 x weekly - 1-2 sets - 10 reps - Supine Shoulder Circles with Weight  - 1 x daily - 7 x weekly - 1-2 sets - 10 reps - Supine Shoulder Flexion with Free Weight  - 1 x daily - 7 x weekly - 2 sets - 10 reps - Sidelying Shoulder Abduction Palm Forward  - 1 x daily - 7 x weekly - 2 sets - 10 reps - Sidelying Shoulder ER with Towel and  Dumbbell  - 1 x daily - 7 x weekly - 2 sets - 10 reps  ASSESSMENT:  CLINICAL IMPRESSION:  07/20/2023  Pt arrives doing OK today, sounds like she has had some extra soreness and pain since last visit and in addition she has been doing a lot of extra work/activities to prepare for the holiday. Pain is still in that same spot on the top of her shoulder, we avoided posterior capsule stretches today and returned to pure strength focus with good tolerance noted. Will plan on re-assessment and potential recert vs DC to advanced HEP next visit pending objective measures/progress towards goals.     EVAL: Patient is a 67 y.o. F who was seen today for physical therapy evaluation and treatment for Diagnosis M54.12 (ICD-10-CM) - Radiculopathy, cervical region M77.11 (ICD-10-CM) - Lateral epicondylitis, right elbow M75.41 (ICD-10-CM) - Impingement syndrome of right shoulder. Exam with objective findings as above, right now her main concern is shoulder pain as the other involved joints have considerably improved, but will continue to monitor. Will benefit from balance screen given that this injury was the result of a fall.    OBJECTIVE  IMPAIRMENTS: decreased mobility, decreased ROM, decreased strength, hypomobility, increased fascial restrictions, increased muscle spasms, impaired flexibility, impaired UE functional use, postural dysfunction, and pain.   ACTIVITY LIMITATIONS: carrying, lifting, bathing, toileting, dressing, reach over head, hygiene/grooming, and caring for others  PARTICIPATION LIMITATIONS: meal prep, cleaning, laundry, shopping, community activity, occupation, yard work, and church  PERSONAL FACTORS: Age, Behavior pattern, Education, Financial risk analyst, Past/current experiences, Profession, Sex, Social background, and Time since onset of injury/illness/exacerbation are also affecting patient's functional outcome.   REHAB POTENTIAL: Good  CLINICAL DECISION MAKING: Stable/uncomplicated  EVALUATION  COMPLEXITY: Low   GOALS: Goals reviewed with patient? No  SHORT TERM GOALS: Target date: 07/06/2023    Will be compliant with appropriate progressive HEP  Baseline: Goal status: MET 07/08/23  2.  AROM R shoulder to be equal to that of the left and pain free  Baseline:  Goal status: ONGOING 07/08/23  3.  Cervical AROM to be symmetrical and pain free  Baseline:  Goal status: PARTIALLY MET 07/08/23  4.  Will demonstrate improved awareness of posture with functional task performance and use of postural aides PRN  Baseline:  Goal status: ONGOING 07/08/23   LONG TERM GOALS: Target date: 07/27/2023    MMT to be equal and symmetrical in BUEs  Baseline:  Goal status: ONGOING 07/08/23  2.  Pain to be no more than 2/10 at worst R UE  Baseline:  Goal status: ONGOING 07/08/23  3.  Will score at least 20/24 on DGI to show reduced fall risk  Baseline:  Goal status: DEFERRED 07/08/23 pt with no further balance concerns   4.  Will be able to perform all functional work and recreational tasks without increase from resting pain levels including reaching overhead and lifting chairs at work  Baseline:  Goal status: ONGOING 07/08/23  5.  FOTO score to be within 5 points of predicted by time of DC  Baseline:  Goal status: ONGOING 07/08/23    PLAN:  PT FREQUENCY: 2x/week  PT DURATION: 6 weeks  PLANNED INTERVENTIONS: 97164- PT Re-evaluation, 97110-Therapeutic exercises, 97530- Therapeutic activity, 97112- Neuromuscular re-education, 97535- Self Care, 08657- Manual therapy, 97014- Electrical stimulation (unattended), Patient/Family education, Balance training, Dry Needling  PLAN FOR NEXT SESSION: shoulder and cervical ROM and strength, postural training. Repeat DN as able, this was helpful. Hold off on balance/focus on shoulder. Needs re-assess/recert next visit   Nedra Hai, PT, DPT 07/20/23 3:12 PM   07/20/23 3:12 PM   Referring diagnosis? Diagnosis M54.12 (ICD-10-CM) -  Radiculopathy, cervical region M77.11 (ICD-10-CM) - Lateral epicondylitis, right elbow M75.41 (ICD-10-CM) - Impingement syndrome of right shoulder  Treatment diagnosis? (if different than referring diagnosis) M25.511, G89.29, M54.2, M25.611, M62.81   What was this (referring dx) caused by? []  Surgery [x]  Fall []  Ongoing issue []  Arthritis []  Other: ____________  Laterality: [x]  Rt []  Lt []  Both  Check all possible CPT codes:  *CHOOSE 10 OR LESS*    See Planned Interventions listed in the Plan section of the Evaluation.

## 2023-07-24 ENCOUNTER — Ambulatory Visit: Payer: Medicare PPO | Admitting: Physical Therapy

## 2023-07-24 ENCOUNTER — Encounter: Payer: Self-pay | Admitting: Physical Therapy

## 2023-07-24 DIAGNOSIS — M25611 Stiffness of right shoulder, not elsewhere classified: Secondary | ICD-10-CM | POA: Diagnosis not present

## 2023-07-24 DIAGNOSIS — M25511 Pain in right shoulder: Secondary | ICD-10-CM | POA: Diagnosis not present

## 2023-07-24 DIAGNOSIS — M542 Cervicalgia: Secondary | ICD-10-CM | POA: Diagnosis not present

## 2023-07-24 DIAGNOSIS — M6281 Muscle weakness (generalized): Secondary | ICD-10-CM

## 2023-07-24 DIAGNOSIS — G8929 Other chronic pain: Secondary | ICD-10-CM | POA: Diagnosis not present

## 2023-07-24 NOTE — Therapy (Addendum)
OUTPATIENT PHYSICAL THERAPY SHOULDER TREATMENT/PROGRESS NOTE   Patient Name: Katrina Obrien MRN: [id number] DOB:11-Nov-1955, 67 y.o., female Today's Date: 07/24/2023   Progress Note Reporting Period 06/15/23 to 07/24/23  See note below for Objective Data and Assessment of Progress/Goals.      END OF SESSION:  PT End of Session - 07/24/23 1421     Visit Number 10    Number of Visits 14    Date for PT Re-Evaluation 08/14/23    Authorization Type [employer] MCR    Authorization Time Period 06/15/23 to 07/27/23; extended to 08/14/23    Authorization - Number of Visits 12    Progress Note Due on Visit 20    PT Start Time 1347    PT Stop Time 1427    PT Time Calculation (min) 40 min    Activity Tolerance Patient tolerated treatment well    Behavior During Therapy WFL for tasks assessed/performed                     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   Clotting disorder (HCC)    anticoagulant - coumadin hx DVT, PE   Coronary artery disease    s/p 3- vessel bypass    Diabetes mellitus without complication (HCC)    DVT (deep venous thrombosis) (HCC)    Heart murmur    Hyperlipidemia    Hypertension    Ischemic cardiomyopathy    40 to 45%   Left bundle branch block    Mechanical heart valve present    Jan 2012   Popliteal artery occlusion, left (HCC)    recent  status  post angioplasty   Pulmonary embolism (HCC)    associated with DVT   Pulmonary embolus (HCC)    Thyroid disease    hyperthyroidism   Past Surgical History:  Procedure Laterality Date   AORTIC VALVE REPLACEMENT (AVR)/CORONARY ARTERY BYPASS GRAFTING (CABG)  08/16/2010  VAN TRIGT   AV replacement w/19-mm mechanical St Jude valve ([id number]),CABGX3  lima to LAD, SVG to distal circ,SVG to posterior descending   ASD REPAIR     CARDIAC CATHETERIZATION  08/08/2010   ,LAD  50% PROX,605 TANDEM SEGMENTAL STENOSIS,LEFT CIRC 60%-AV GROOVE, RGT COR 40% PROX ON THE BEND ,80% PROX PDA,   CORONARY ARTERY BYPASS GRAFT  08/16/2010   LIMA TO lad,svg to distal circ ,svg to posterior descending   DOPPLER ECHOCARDIOGRAPHY  08/09/2010   EF35% to 40% ,bbicuspid ;severely  thickened ,severely calcified leaflets, LV normal   LEA doppler  08/07/2010   left popliteal occlusion ,and anterior tibial complete occlusion   NM MYOCAR PERF WALL MOTION  08/06/2010   EF 31%  LOW RISK SCAN   nuc     PV ANGIOGRAM  08/08/2010   successful PTA LEFT POPLITEAL   Patient Active Problem List   Diagnosis Date Noted   Restless leg syndrome 02/16/2023   Plantar fasciitis of right foot 06/16/2022   Diarrhea due to drug- metformin 06/08/2019   Caregiver stress syndrome 03/08/2019   Vitamin D insufficiency 09/13/2018   White coat syndrome with diagnosis of hypertension 09/13/2018   Hyperlipidemia associated with type 2 diabetes mellitus (HCC) 02/03/2018   Hypothyroidism 02/03/2018   LBBB (left bundle branch block) 04/06/2014   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   Aortic stenosis 01/26/2013   S/P AVR 01/26/2013   S/P  CABG x 3 01/26/2013   Hypertension associated with diabetes (HCC) 01/26/2013   Familial hyperlipidemia 01/26/2013   Cardiomyopathy, ischemic 01/26/2013   DVT (deep venous thrombosis) (HCC) 01/26/2013   Pulmonary embolus (HCC) 01/26/2013   Uncontrolled type 2 diabetes mellitus with hyperglycemia (HCC) 01/26/2013   Long term (current) use of anticoagulants 10/11/2012    PCP: Saralyn Pilar PA-C   REFERRING PROVIDER: Kirtland Bouchard, PA-C  REFERRING DIAG: Diagnosis M54.12 (ICD-10-CM) - Radiculopathy, cervical region M77.11 (ICD-10-CM) - Lateral epicondylitis, right elbow M75.41 (ICD-10-CM) - Impingement syndrome of right shoulder  THERAPY DIAG:  Chronic right shoulder pain - Plan: PT plan of care cert/re-cert  Cervicalgia - Plan: PT plan of care  cert/re-cert  Stiffness of right shoulder, not elsewhere classified - Plan: PT plan of care cert/re-cert  Muscle weakness (generalized) - Plan: PT plan of care cert/re-cert  Rationale for Evaluation and Treatment: Rehabilitation  ONSET DATE: about 3 months ago   SUBJECTIVE:                                                                                                                                                                                      SUBJECTIVE STATEMENT:  Feeling really good today, but had a couple big things happen. Got home yesterday and found my husband laying on the middle of the kitchen floor, he's ok no injuries. Getting him up off the floor was not easy, I'd like to be able to help him more than I can now. Out of school now and not doing quite as much lifting on a daily basis, really not having any pain at all now. Was able to put christmas decorations away without pain.      EVAL: I was walking the dog and had a fall, I really fell hard on my right arm. Everything seemed to heal except for my wrist, elbow, and shoulder. Stepped on the side of the road wrong and fell, twisted my ankle and hit my head as well. Having ongoing aching in wrist/elbow/shoulder and its better, got a cortisone shot a couple of weeks ago and that is painful. 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 Having night pain, doesn't seem to matter if I lay on left or right sides but I do have to support my right arm with pillows. Having some right sided neck issues since the fall too, most of my pain is in the arm and shoulder.  Hand dominance: Right  PERTINENT HISTORY: See above   PAIN:  Are you having pain? No 0/10 now, over past week 3/10 at  worst   PRECAUTIONS: None  RED FLAGS: None   WEIGHT BEARING RESTRICTIONS: No  FALLS:  Has patient fallen in last 6 months? Yes. 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  DIAGNOSTIC FINDINGS:  IMPRESSION: Mild degenerative change of the radiocarpal and thumb carpometacarpal joints.     IMPRESSION: Mild to moderate acromioclavicular and trace glenohumeral degenerative spurring.  PATIENT SURVEYS:  FOTO 50, predicted 69 in 9 visits; 07/15/23- 64.5    COGNITION: Overall cognitive status: Within functional limits for tasks assessed     SENSATION: Not tested  POSTURE: Rounded shoulders, forward head   UPPER EXTREMITY ROM:   Active ROM Right eval Left eval Right 07/01/23 AROM 07/08/23 AROM 07/24/23 AROM Right  07/24/23 AROM Left   Shoulder flexion 115* 128* 124*  160* 160*  Shoulder extension        Shoulder abduction 165* pain 175* 124* more painful that usual   165* 168*  Shoulder adduction        Shoulder internal rotation L1 T10 T8  T7 T7  Shoulder external rotation T4  T4 T4  T4 T4  Elbow flexion WNL  WNL       Elbow extension WNL  WNL       Wrist flexion WNL  WNL       Wrist extension WNL  WNL       Wrist ulnar deviation        Wrist radial deviation        Wrist pronation WNL  WNL       Wrist supination WNL  WNL       Cervical flexion  48* ---  60*  60*  Cervical extension  30* ---  44*  45*  Cervical lateral flexion  35* pain 42*  R 30* L 33* 30* 50*  Cervical rotation  42*  pain 70*  R 60* L 68* 48* 60*  (Blank rows = not tested)  UPPER EXTREMITY MMT:  MMT Right eval Left eval Right 07/08/23 Left 07/08/23 Right 07/24/23 Left 07/24/23  Shoulder flexion 3- 4+ 3+ 4+ 5 5  Shoulder extension        Shoulder abduction 3- 4+ 3+ 4+ 4 pain 5  Shoulder adduction        Shoulder internal rotation 3- 4+ 4+ 5 5 5   Shoulder external rotation 4 4+ 4+ 5 4+ 5  Middle trapezius        Lower trapezius        Elbow flexion 4+ 4+ 5 5 5 5   Elbow extension 4 4+ 4+ 5 5 5   Wrist flexion        Wrist extension        Wrist ulnar deviation        Wrist radial deviation        Wrist pronation  Wrist supination        Grip strength (lbs)        (Blank rows = not tested)    PALPATION:  Tender R rotator cuff, upper trap and levator, R delts and pec    TODAY'S TREATMENT:                                                                                                                                         DATE:   07/24/23  Objective MMT/ROM for progress note, education on progress towards goals and POC moving forward   TherEx  UBE L3 x8 minutes backwards only all 4 extremities  Blackburn 6 x12 each 0# Upper trap stretch 2x30 seconds B Levator scap stretch 1x30 seconds B       07/20/23  TherEx  UBE 2.5 x8 minutes backwards only Blackburn 6 x10 each 0# prone  Thoracic excursions x20  Thoracic extensions over double towel roll x10 with BUE flexion    On BACTA weight machine:  - rows 10# x15, 15# x7 - lat pulls 10# x15, 15# x7 - tricep curls 20# x15, 25# x7 - bicep curls 10# x15, 15# x7       07/15/23  TherEx  UBE L2 x8 minutes backwards only all 4 extremities  FOTO update  Supine shoulder flexion 2# 2x15 RUE  Sidelying shoulder ABD through tolerated ROM 2# 2x12 RUE  Sidelying shoulder ER 3# 2x10  Thoracic 3D excursions all directions x20 Attempted horizontal ADD stretch R shoulder- pain limited  Repeated horizontal ADD x12 with short  holds, much more tolerable      BATCA UE resistance training:  - rows 10# x10  - lat pulls 10# x10  - tricep curls 20# x10  - bicep curls 10# x10      07/13/23  TherEx  UBE L2 x8 minutes backwards only all 4 extremities  Supine shoulder flexion 2# 2x12 Sidelying shoulder ABD to 90* 2# 2x12 Sidelying shoulder ER 2# 2x12 Rows green TB 20x3 second holds  Shoulder extensions 12x3 seconds green TB 3 way reaches on wall yellow TB 2x5 B   07/08/23  TherEx  UBE L1.5 x8 minutes, backwards only all four extremities  MMT and cervical ROM recheck- see charts above Supine shoulder flexion 1# 2x10 Sidelying shoulder ABD to 90* 1# 2x10 Rows green TB 15x2 second holds  Shoulder extensions red TB 10x2 second holds  Sidelying R shoulder ER 2# 2x10 towel tucked under elbow Shoulder flexion stretch up wall ladder 12x5 second holds (to slot 18) Upper trap stretch 2x30 seconds B       07/06/23  TherEx  UBE all 4 extremities L1.5 backwards x6 minutes Serratus punches 2# x15 (to HEP) Serratus CW/CCW circles x15 each 2# (to HEP) Serratus alphabet x2 2# Shoulder isometrics: flexion x10, ABD x10, extensions x10 Rows green TB x10 3 second holds  Shoulder extensions yellow  TB x10 3 second holds  Shoulder flexion using BUEs with 1# bar x5-  scap retraction  and back of head to wall for good alignment Supine shoulder ABD AAROM 1# rod x10 to just below shoulder height x10 Thoracic extensions + light shoulder ABD/ER for pec mobility x10      07/01/23  Inferior and anterior shoulder apprehension tests both (-) ROM testing updated  TherEx  UBE L1.5 x6 minutes backwards Pec minor stretch in door way 2x30 seconds Supine serratus punches 2# x12  Supine serratus CW/CCW circles 2# x10 each way Supine serratus alphabet x1 round  Rows red TB 15x3 second holds   Shoulder extensions red TB 15x3 second holds  Manual  STM R rotator cuff and rhomboids    06/23/23:  TherEx:   UBE: level 1 x 2 minutes each direction Rows: c green TB 2 x 10 holding 3 sec Rt shoulder ER c red TB 2 x 10  Shoulder extension red TB  2 x 10  Manual:  Skilled palpation to active trigger points Trigger Point Dry-Needling  Treatment instructions: Expect mild to moderate muscle soreness. S/S of pneumothorax if dry needled over a lung field, and to seek immediate medical attention should they occur. Patient verbalized understanding of these instructions and education.  Patient Consent Given: Yes Education handout provided: Yes Muscles treated: left deltoid Electrical stimulation performed: No Parameters: N/A Treatment response/outcome: twitch response noted, pt with good response to treatment  Moist heat x 5 minutes following DN   06/19/23  UBE L1x6 minutes backwards only, all 4 extremities  Supine AAROM flexion 1# 10x3 second holds  Supine AAROM ABD 1# 10x3 second holds Seated AAROM ER 1# 10x3 second holds  Pec stretch in corner 3x30 seconds  Isometrics on wall: shoulder flexion 10x3 second holds, ABD 10x3 second holds, ER 10x3 second holds Scap retractions red TB x12       06/15/23- Eval, POC, HEP  Scap retractions red TB Shoulder extensions red TB Shoulder flexion stretch on door Backward shoulder rolls UBE L1 x6 minutes backwards      PATIENT EDUCATION: Education details: exam findings, POC, HEP, role of PT and exercise in managing pain and improving posture/function  Person educated: Patient Education method: Explanation, Demonstration, and Handouts Education comprehension: verbalized understanding, returned demonstration, and needs further education  HOME EXERCISE PROGRAM:  Access Code: [email or web address]  URL: [email or web address] Date: 07/24/2023 Prepared by: Nedra Hai  Exercises - Scapular Retraction with Resistance  - 1 x daily - 7 x weekly - 2 sets - 10 reps - 2 seconds  hold - Shoulder extension with resistance - Neutral   - 1 x daily - 7 x weekly - 2 sets - 10 reps - 2 seconds  hold - Standing Single Arm Shoulder Flexion Stretch on Wall  - 1 x daily - 7 x weekly - 2 sets - 10 reps - 5 seconds  hold - Standing Backward Shoulder Rolls  - 1 x daily - 7 x weekly - 2 sets - 10 reps - Shoulder External Rotation with Anchored Resistance with Towel Under Elbow  - 1-2 x daily - 7 x weekly - 2 sets - 10 reps - Single Arm Serratus Punches in Supine with Dumbbell  - 1 x daily - 7 x weekly - 1-2 sets - 10 reps - Supine Shoulder Circles with Weight  - 1 x daily - 7 x weekly - 1-2 sets - 10 reps - Supine Shoulder Flexion with Free Weight  -  1 x daily - 7 x weekly - 2 sets - 10 reps - Sidelying Shoulder Abduction Palm Forward  - 1 x daily - 7 x weekly - 2 sets - 10 reps - Sidelying Shoulder ER with Towel and Dumbbell  - 1 x daily - 7 x weekly - 2 sets - 10 reps - Seated Upper Trapezius Stretch  - 2 x daily - 7 x weekly - 1 sets - 2 reps - 30 seconds  hold - Gentle Levator Scapulae Stretch  - 2 x daily - 7 x weekly - 1 sets - 2 reps - 30 seconds  hold  ASSESSMENT:  CLINICAL IMPRESSION:  07/24/2023  Pt arrives doing really well today, focused on taking objective measures for progress note and recert- she has made significant, excellent progress with PT and is very close to the point that she could maintain with finalized HEP. Would like to keep her last remaining appointments to make sure that pain does not return when school starts again, but realistically may be able to potentially DC sooner if she continues to feel this well.     EVAL: Patient is a 67 y.o. F who was seen today for physical therapy evaluation and treatment for Diagnosis M54.12 (ICD-10-CM) - Radiculopathy, cervical region M77.11 (ICD-10-CM) - Lateral epicondylitis, right elbow M75.41 (ICD-10-CM) - Impingement syndrome of right shoulder. Exam with objective findings as above, right now her main concern is shoulder pain as the other involved joints have  considerably improved, but will continue to monitor. Will benefit from balance screen given that this injury was the result of a fall.    OBJECTIVE IMPAIRMENTS: decreased mobility, decreased ROM, decreased strength, hypomobility, increased fascial restrictions, increased muscle spasms, impaired flexibility, impaired UE functional use, postural dysfunction, and pain.   ACTIVITY LIMITATIONS: carrying, lifting, bathing, toileting, dressing, reach over head, hygiene/grooming, and caring for others  PARTICIPATION LIMITATIONS: meal prep, cleaning, laundry, shopping, community activity, occupation, yard work, and church  PERSONAL FACTORS: Age, Behavior pattern, Education, Financial risk analyst, Past/current experiences, Profession, Sex, Social background, and Time since onset of injury/illness/exacerbation are also affecting patient's functional outcome.   REHAB POTENTIAL: Good  CLINICAL DECISION MAKING: Stable/uncomplicated  EVALUATION COMPLEXITY: Low   GOALS: Goals reviewed with patient? No  SHORT TERM GOALS: Target date:  recert 07/24/23 at this point STGs = LTGs     Will be compliant with appropriate progressive HEP  Baseline: Goal status: MET 07/08/23  2.  AROM R shoulder to be equal to that of the left and pain free  Baseline:  Goal status: MET 07/24/23  3.  Cervical AROM to be symmetrical and pain free  Baseline:  Goal status: ONGOING 07/24/23  4.  Will demonstrate improved awareness of posture with functional task performance and use of postural aides PRN  Baseline:  Goal status: MET 07/24/23   LONG TERM GOALS: Target date: 08/14/2023      MMT to be equal and symmetrical in BUEs  Baseline:  Goal status: PARTIALLY MET 07/24/23  2.  Pain to be no more than 2/10 at worst R UE  Baseline:  Goal status: ONGOING 07/24/23  3.  Will score at least 20/24 on DGI to show reduced fall risk  Baseline:  Goal status: DEFERRED 07/08/23 pt with no further balance concerns   4.  Will be able  to perform all functional work and recreational tasks without increase from resting pain levels including reaching overhead and lifting chairs at work  Baseline:  Goal status: MET 07/24/23  5.  FOTO score to be within 5 points of predicted by time of DC  Baseline:  Goal status: MET 07/15/23    PLAN:  PT FREQUENCY:  4 more visits   PT DURATION: 3 weeks  PLANNED INTERVENTIONS: 97164- PT Re-evaluation, 97110-Therapeutic exercises, 97530- Therapeutic activity, 97112- Neuromuscular re-education, 97535- Self Care, 13086- Manual therapy, 97014- Electrical stimulation (unattended), Patient/Family education, Balance training, Dry Needling  PLAN FOR NEXT SESSION: focus on advancing functional strength/postural training, progress HEP. Likely DC on 08/12/23 if not sooner. Will need to ask [employer] for 2 additional visits if we continue past appt on 08/05/23  Nedra Hai, PT, DPT 07/24/23 2:28 PM   07/24/23 2:28 PM   Referring diagnosis? Diagnosis M54.12 (ICD-10-CM) - Radiculopathy, cervical region M77.11 (ICD-10-CM) - Lateral epicondylitis, right elbow M75.41 (ICD-10-CM) - Impingement syndrome of right shoulder  Treatment diagnosis? (if different than referring diagnosis) M25.511, G89.29, M54.2, M25.611, M62.81   What was this (referring dx) caused by? []  Surgery [x]  Fall []  Ongoing issue []  Arthritis []  Other: ____________  Laterality: [x]  Rt []  Lt []  Both  Check all possible CPT codes:  *CHOOSE 10 OR LESS*    See Planned Interventions listed in the Plan section of the Evaluation.

## 2023-08-03 ENCOUNTER — Ambulatory Visit: Payer: Medicare PPO | Admitting: Physical Therapy

## 2023-08-03 ENCOUNTER — Encounter: Payer: Self-pay | Admitting: Physical Therapy

## 2023-08-03 DIAGNOSIS — M25611 Stiffness of right shoulder, not elsewhere classified: Secondary | ICD-10-CM | POA: Diagnosis not present

## 2023-08-03 DIAGNOSIS — M542 Cervicalgia: Secondary | ICD-10-CM

## 2023-08-03 DIAGNOSIS — G8929 Other chronic pain: Secondary | ICD-10-CM | POA: Diagnosis not present

## 2023-08-03 DIAGNOSIS — M25511 Pain in right shoulder: Secondary | ICD-10-CM | POA: Diagnosis not present

## 2023-08-03 DIAGNOSIS — M6281 Muscle weakness (generalized): Secondary | ICD-10-CM | POA: Diagnosis not present

## 2023-08-03 NOTE — Therapy (Signed)
 OUTPATIENT PHYSICAL THERAPY SHOULDER TREATMENT   Patient Name: Katrina Obrien MRN: [id number] DOB:1956/03/03, 68 y.o., female Today's Date: 08/03/2023        END OF SESSION:  PT End of Session - 08/03/23 1518     Visit Number 11    Number of Visits 14    Date for PT Re-Evaluation 08/14/23    Authorization Type [employer] MCR    Authorization Time Period 06/15/23 to 07/27/23; extended to 08/14/23    Progress Note Due on Visit 20    PT Start Time 1430    PT Stop Time 1510    PT Time Calculation (min) 40 min    Activity Tolerance Patient tolerated treatment well    Behavior During Therapy [facility] for tasks assessed/performed                      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   Clotting disorder (HCC)    anticoagulant - coumadin  hx DVT, PE   Coronary artery disease    s/p 3- vessel bypass    Diabetes mellitus without complication (HCC)    DVT (deep venous thrombosis) (HCC)    Heart murmur    Hyperlipidemia    Hypertension    Ischemic cardiomyopathy    40 to 45%   Left bundle branch block    Mechanical heart valve present    Jan 2012   Popliteal artery occlusion, left (HCC)    recent  status  post angioplasty   Pulmonary embolism (HCC)    associated with DVT   Pulmonary embolus (HCC)    Thyroid  disease    hyperthyroidism   Past Surgical History:  Procedure Laterality Date   AORTIC VALVE REPLACEMENT (AVR)/CORONARY ARTERY BYPASS GRAFTING (CABG)  08/16/2010  VAN TRIGT   AV replacement w/19-mm mechanical St Jude valve ([id number]),CABGX3  lima to LAD, SVG to distal circ,SVG to posterior descending   ASD REPAIR     CARDIAC CATHETERIZATION  08/08/2010   ,LAD 50% PROX,605 TANDEM SEGMENTAL STENOSIS,LEFT CIRC 60%-AV GROOVE, RGT COR 40% PROX ON THE BEND ,80% PROX PDA,   CORONARY ARTERY BYPASS GRAFT  08/16/2010   LIMA TO lad,svg to  distal circ ,svg to posterior descending   DOPPLER ECHOCARDIOGRAPHY  08/09/2010   EF35% to 40% ,bbicuspid ;severely  thickened ,severely calcified leaflets, LV normal   LEA doppler  08/07/2010   left popliteal occlusion ,and anterior tibial complete occlusion   NM MYOCAR PERF WALL MOTION  08/06/2010   EF 31%  LOW RISK SCAN   nuc     PV ANGIOGRAM  08/08/2010   successful PTA LEFT POPLITEAL   Patient Active Problem List   Diagnosis Date Noted   Restless leg syndrome 02/16/2023   Plantar fasciitis of right foot 06/16/2022   Diarrhea due to drug- metformin  06/08/2019   Caregiver stress syndrome 03/08/2019   Vitamin D  insufficiency 09/13/2018   White coat syndrome with diagnosis of hypertension 09/13/2018   Hyperlipidemia associated with type 2 diabetes mellitus (HCC) 02/03/2018   Hypothyroidism 02/03/2018   LBBB (left bundle branch block) 04/06/2014   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   Aortic stenosis 01/26/2013   S/P AVR 01/26/2013   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   Hypertension associated with diabetes (HCC) 01/26/2013   Familial hyperlipidemia 01/26/2013   Cardiomyopathy, ischemic 01/26/2013   DVT (deep venous  thrombosis) (HCC) 01/26/2013   Pulmonary embolus (HCC) 01/26/2013   Uncontrolled type 2 diabetes mellitus with hyperglycemia (HCC) 01/26/2013   Long term (current) use of anticoagulants 10/11/2012    PCP: Wallace Search PA-C   REFERRING PROVIDER: Gretta Bertrum ORN, PA-C  REFERRING DIAG: Diagnosis M54.12 (ICD-10-CM) - Radiculopathy, cervical region M77.11 (ICD-10-CM) - Lateral epicondylitis, right elbow M75.41 (ICD-10-CM) - Impingement syndrome of right shoulder  THERAPY DIAG:  Chronic right shoulder pain  Cervicalgia  Stiffness of right shoulder, not elsewhere classified  Muscle weakness (generalized)  Rationale for Evaluation and Treatment: Rehabilitation  ONSET DATE: about 3 months ago   SUBJECTIVE:                                                                                                                                                                                       SUBJECTIVE STATEMENT:  Pt reporting feeling good after her trip to the beach over the holiday. Pt reporting worse pain over the last week was following lifting which she states reached 5/10 at most.      EVAL: I was walking the dog and had a fall, I really fell hard on my right arm. Everything seemed to heal except for my wrist, elbow, and shoulder. Stepped on the side of the road wrong and fell, twisted my ankle and hit my head as well. Having ongoing aching in wrist/elbow/shoulder and its better, got a cortisone shot a couple of weeks ago and that is painful. 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 Having night pain, doesn't seem to matter if I lay on left or right sides but I do have to support my right arm with pillows. Having some right sided neck issues since the fall too, most of my pain is in the arm and shoulder.  Hand dominance: Right  PERTINENT HISTORY: See above   PAIN:  Are you having pain? No 0/10 now, over past week 5/10 at worst   PRECAUTIONS: None  RED FLAGS: None   WEIGHT BEARING RESTRICTIONS: No  FALLS:  Has patient fallen in last 6 months? Yes. 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  DIAGNOSTIC FINDINGS:  IMPRESSION: Mild degenerative change of the radiocarpal and thumb carpometacarpal joints.     IMPRESSION: Mild to moderate acromioclavicular and trace glenohumeral degenerative spurring.  PATIENT SURVEYS:  FOTO 50, predicted 69 in 9 visits; 07/15/23- 64.5    COGNITION: Overall cognitive status: Within functional limits for tasks assessed     SENSATION: Not tested  POSTURE: Rounded shoulders, forward head   UPPER EXTREMITY ROM:   Active ROM Right eval Left eval Right 07/01/23 AROM 07/08/23 AROM 07/24/23 AROM Right  07/24/23 AROM Left  08/03/23 AROM left  Shoulder flexion 115* 128* 124*  160* 160* 160  Shoulder extension         Shoulder abduction 165* pain 175* 124* more painful that usual   165* 168* 168  Shoulder adduction         Shoulder internal rotation L1 T10 T8  T7 T7 T8  Shoulder external rotation T4  T4 T4  T4 T4 T4  Elbow flexion WNL  WNL        Elbow extension WNL  WNL        Wrist flexion WNL  WNL        Wrist extension WNL  WNL        Wrist ulnar deviation         Wrist radial deviation         Wrist pronation WNL  WNL        Wrist supination WNL  WNL        Cervical flexion  48* ---  60*  60*   Cervical extension  30* ---  44*  45*   Cervical lateral flexion  35* pain 42*  R 30* L 33* 30* 50*   Cervical rotation  42* pain 70*  R 60* L 68* 48* 60*   (Blank rows = not tested)  UPPER EXTREMITY MMT:  MMT Right eval Left eval Right 07/08/23 Left 07/08/23 Right 07/24/23 Left 07/24/23  Shoulder flexion 3- 4+ 3+ 4+ 5 5  Shoulder extension        Shoulder abduction 3- 4+ 3+ 4+ 4 pain 5  Shoulder adduction        Shoulder internal rotation 3- 4+ 4+ 5 5 5   Shoulder external rotation 4 4+ 4+ 5 4+ 5  Middle trapezius        Lower trapezius        Elbow flexion 4+ 4+ 5 5 5 5   Elbow extension 4 4+ 4+ 5 5 5   Wrist flexion        Wrist  extension        Wrist ulnar deviation        Wrist radial deviation        Wrist pronation        Wrist supination        Grip strength (lbs)        (Blank rows = not tested)    PALPATION:  Tender R rotator cuff, upper trap and levator, R delts and pec    TODAY'S TREATMENT:  DATE:  08/03/23:  TherEx:  UBE: Level 3 x 4 minutes each direction Standing flexion c 2 # x 10  Standing abd c 2 # x 10  Rows; blue TB 2 x 15  holding 3 sec Standing abd stretch using ball on the wall x 10 holding 3 sec end range Lifting 2# weight to 1st and 2nd clinic shelf x 10 each DGI  score 24   07/24/23  Objective MMT/ROM for progress note, education on progress towards goals and POC moving forward   TherEx  UBE L3 x8 minutes backwards only all 4 extremities  Blackburn 6 x12 each 0# Upper trap stretch 2x30 seconds B Levator scap stretch 1x30 seconds B       07/20/23  TherEx  UBE 2.5 x8 minutes backwards only [place]burn 6 x10 each 0# prone  Thoracic excursions x20  Thoracic extensions over double towel roll x10 with BUE flexion    On BACTA weight machine:  - rows 10# x15, 15# x7 - lat pulls 10# x15, 15# x7 - tricep curls 20# x15, 25# x7 - bicep curls 10# x15, 15# x7         PATIENT EDUCATION: Education details: exam findings, POC, HEP, role of PT and exercise in managing pain and improving posture/function  Person educated: Patient Education method: Explanation, Demonstration, and Handouts Education comprehension: verbalized understanding, returned demonstration, and needs further education  HOME EXERCISE PROGRAM:  Access Code: [email or web address]  URL: [email or web address] Date: 07/24/2023 Prepared by: Josette Rough  Exercises - Scapular Retraction with Resistance  - 1 x daily - 7 x weekly - 2 sets - 10 reps - 2  seconds  hold - Shoulder extension with resistance - Neutral  - 1 x daily - 7 x weekly - 2 sets - 10 reps - 2 seconds  hold - Standing Single Arm Shoulder Flexion Stretch on Wall  - 1 x daily - 7 x weekly - 2 sets - 10 reps - 5 seconds  hold - Standing Backward Shoulder Rolls  - 1 x daily - 7 x weekly - 2 sets - 10 reps - Shoulder External Rotation with Anchored Resistance with Towel Under Elbow  - 1-2 x daily - 7 x weekly - 2 sets - 10 reps - Single Arm Serratus Punches in Supine with Dumbbell  - 1 x daily - 7 x weekly - 1-2 sets - 10 reps - Supine Shoulder Circles with Weight  - 1 x daily - 7 x weekly - 1-2 sets - 10 reps - Supine Shoulder Flexion with Free Weight  - 1 x daily - 7 x weekly - 2 sets - 10 reps - Sidelying Shoulder Abduction Palm Forward  - 1 x daily - 7 x weekly - 2 sets - 10 reps - Sidelying Shoulder ER with Towel and Dumbbell  - 1 x daily - 7 x weekly - 2 sets - 10 reps - Seated Upper Trapezius Stretch  - 2 x daily - 7 x weekly - 1 sets - 2 reps - 30 seconds  hold - Gentle Levator Scapulae Stretch  - 2 x daily - 7 x weekly - 1 sets - 2 reps - 30 seconds  hold  ASSESSMENT:  CLINICAL IMPRESSION:  08/03/2023 Pt is doing well with no pain reported upon arrival today. Pt still reporting pain increasing to 5/10 only after lifting which pt states is probably farily normal for performing tasks that are out of her routine. Pt stating she is ready for discharge  at her next visit.       EVAL: Patient is a 68 y.o. F who was seen today for physical therapy evaluation and treatment for Diagnosis M54.12 (ICD-10-CM) - Radiculopathy, cervical region M77.11 (ICD-10-CM) - Lateral epicondylitis, right elbow M75.41 (ICD-10-CM) - Impingement syndrome of right shoulder. Exam with objective findings as above, right now her main concern is shoulder pain as the other involved joints have considerably improved, but will continue to monitor. Will benefit from balance screen given that this injury was the  result of a fall.    OBJECTIVE IMPAIRMENTS: decreased mobility, decreased ROM, decreased strength, hypomobility, increased fascial restrictions, increased muscle spasms, impaired flexibility, impaired UE functional use, postural dysfunction, and pain.   ACTIVITY LIMITATIONS: carrying, lifting, bathing, toileting, dressing, reach over head, hygiene/grooming, and caring for others  PARTICIPATION LIMITATIONS: meal prep, cleaning, laundry, shopping, community activity, occupation, yard work, and church  PERSONAL FACTORS: Age, Behavior pattern, Education, Financial Risk Analyst, Past/current experiences, Profession, Sex, Social background, and Time since onset of injury/illness/exacerbation are also affecting patient's functional outcome.   REHAB POTENTIAL: Good  CLINICAL DECISION MAKING: Stable/uncomplicated  EVALUATION COMPLEXITY: Low   GOALS: Goals reviewed with patient? No  SHORT TERM GOALS: Target date:  recert 07/24/23 at this point STGs = LTGs     Will be compliant with appropriate progressive HEP  Baseline: Goal status: MET 07/08/23  2.  AROM R shoulder to be equal to that of the left and pain free  Baseline:  Goal status: MET 07/24/23  3.  Cervical AROM to be symmetrical and pain free  Baseline:  Goal status: MET 08/03/23  4.  Will demonstrate improved awareness of posture with functional task performance and use of postural aides PRN  Baseline:  Goal status: MET 07/24/23   LONG TERM GOALS: Target date: 08/14/2023      MMT to be equal and symmetrical in BUEs  Baseline:  Goal status: MET 08/03/23  2.  Pain to be no more than 2/10 at worst R UE  Baseline:  Goal status: ONGOING 08/02/22  3.  Will score at least 20/24 on DGI to show reduced fall risk  Baseline:  Goal status: MET 08/03/23  4.  Will be able to perform all functional work and recreational tasks without increase from resting pain levels including reaching overhead and lifting chairs at work  Baseline:  Goal status: MET  07/24/23  5.  FOTO score to be within 5 points of predicted by time of DC  Baseline:  Goal status: MET 07/15/23    PLAN:  PT FREQUENCY: Discharge next visit  PT DURATION: 3 weeks  PLANNED INTERVENTIONS: 97164- PT Re-evaluation, 97110-Therapeutic exercises, 97530- Therapeutic activity, 97112- Neuromuscular re-education, 97535- Self Care, [id number]- Manual therapy, 97014- Electrical stimulation (unattended), Patient/Family education, Balance training, Dry Needling  PLAN FOR NEXT SESSION: focus on advancing functional strength/postural training, progress HEP.   Discharge next visit after updating and reviewing pt's extensive HEP  Delon Lunger, PT, MPT 08/03/23 3:19 PM         Referring diagnosis? Diagnosis M54.12 (ICD-10-CM) - Radiculopathy, cervical region M77.11 (ICD-10-CM) - Lateral epicondylitis, right elbow M75.41 (ICD-10-CM) - Impingement syndrome of right shoulder  Treatment diagnosis? (if different than referring diagnosis) M25.511, G89.29, M54.2, M25.611, M62.81   What was this (referring dx) caused by? []  Surgery [x]  Fall []  Ongoing issue []  Arthritis []  Other: ____________  Laterality: [x]  Rt []  Lt []  Both  Check all possible CPT codes:  *CHOOSE 10 OR LESS*    See Planned Interventions  listed in the Plan section of the Evaluation.

## 2023-08-04 ENCOUNTER — Ambulatory Visit: Payer: Medicare PPO | Attending: Internal Medicine | Admitting: *Deleted

## 2023-08-04 DIAGNOSIS — Z952 Presence of prosthetic heart valve: Secondary | ICD-10-CM | POA: Diagnosis not present

## 2023-08-04 DIAGNOSIS — Z7901 Long term (current) use of anticoagulants: Secondary | ICD-10-CM | POA: Diagnosis not present

## 2023-08-04 LAB — POCT INR: INR: 4.3 — AB (ref 2.0–3.0)

## 2023-08-04 NOTE — Patient Instructions (Addendum)
 Description   Do not take any warfarin today then START taking warfarin 1 tablet daily except 1.5 tablets each Monday and Friday. Repeat INR in 4 weeks. [facility] [phone]

## 2023-08-05 ENCOUNTER — Encounter: Payer: Self-pay | Admitting: Physical Therapy

## 2023-08-05 ENCOUNTER — Ambulatory Visit: Payer: Medicare PPO | Admitting: Physical Therapy

## 2023-08-05 DIAGNOSIS — M6281 Muscle weakness (generalized): Secondary | ICD-10-CM | POA: Diagnosis not present

## 2023-08-05 DIAGNOSIS — G8929 Other chronic pain: Secondary | ICD-10-CM | POA: Diagnosis not present

## 2023-08-05 DIAGNOSIS — M542 Cervicalgia: Secondary | ICD-10-CM | POA: Diagnosis not present

## 2023-08-05 DIAGNOSIS — M25511 Pain in right shoulder: Secondary | ICD-10-CM

## 2023-08-05 DIAGNOSIS — M25611 Stiffness of right shoulder, not elsewhere classified: Secondary | ICD-10-CM | POA: Diagnosis not present

## 2023-08-05 NOTE — Therapy (Signed)
 OUTPATIENT PHYSICAL THERAPY SHOULDER TREATMENT DISCHARGE SUMMARY   Patient Name: Katrina Obrien MRN: [id number] DOB:January 29, 1956, 68 y.o., female Today's Date: 08/05/2023    END OF SESSION:  PT End of Session - 08/05/23 1431     Visit Number 12    Number of Visits --    Date for PT Re-Evaluation --    Authorization Type [employer] MCR    Authorization Time Period 06/15/23 to 07/27/23; extended to 08/14/23    Progress Note Due on Visit 20    PT Start Time 1430    PT Stop Time 1450    PT Time Calculation (min) 20 min    Activity Tolerance Patient tolerated treatment well    Behavior During Therapy [facility] for tasks assessed/performed                 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   Clotting disorder (HCC)    anticoagulant - coumadin  hx DVT, PE   Coronary artery disease    s/p 3- vessel bypass    Diabetes mellitus without complication (HCC)    DVT (deep venous thrombosis) (HCC)    Heart murmur    Hyperlipidemia    Hypertension    Ischemic cardiomyopathy    40 to 45%   Left bundle branch block    Mechanical heart valve present    Jan 2012   Popliteal artery occlusion, left (HCC)    recent  status  post angioplasty   Pulmonary embolism (HCC)    associated with DVT   Pulmonary embolus (HCC)    Thyroid  disease    hyperthyroidism   Past Surgical History:  Procedure Laterality Date   AORTIC VALVE REPLACEMENT (AVR)/CORONARY ARTERY BYPASS GRAFTING (CABG)  08/16/2010  VAN TRIGT   AV replacement w/19-mm mechanical St Jude valve ([id number]),CABGX3  lima to LAD, SVG to distal circ,SVG to posterior descending   ASD REPAIR     CARDIAC CATHETERIZATION  08/08/2010   ,LAD 50% PROX,605 TANDEM SEGMENTAL STENOSIS,LEFT CIRC 60%-AV GROOVE, RGT COR 40% PROX ON THE BEND ,80% PROX PDA,   CORONARY ARTERY BYPASS GRAFT  08/16/2010   LIMA TO lad,svg to distal  circ ,svg to posterior descending   DOPPLER ECHOCARDIOGRAPHY  08/09/2010   EF35% to 40% ,bbicuspid ;severely  thickened ,severely calcified leaflets, LV normal   LEA doppler  08/07/2010   left popliteal occlusion ,and anterior tibial complete occlusion   NM MYOCAR PERF WALL MOTION  08/06/2010   EF 31%  LOW RISK SCAN   nuc     PV ANGIOGRAM  08/08/2010   successful PTA LEFT POPLITEAL   Patient Active Problem List   Diagnosis Date Noted   Restless leg syndrome 02/16/2023   Plantar fasciitis of right foot 06/16/2022   Diarrhea due to drug- metformin  06/08/2019   Caregiver stress syndrome 03/08/2019   Vitamin D  insufficiency 09/13/2018   White coat syndrome with diagnosis of hypertension 09/13/2018   Hyperlipidemia associated with type 2 diabetes mellitus (HCC) 02/03/2018   Hypothyroidism 02/03/2018   LBBB (left bundle branch block) 04/06/2014   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   Aortic stenosis 01/26/2013   S/P AVR 01/26/2013   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   Hypertension associated with diabetes (HCC) 01/26/2013   Familial hyperlipidemia 01/26/2013   Cardiomyopathy, ischemic 01/26/2013   DVT (deep venous thrombosis) (HCC) 01/26/2013   Pulmonary embolus (  HCC) 01/26/2013   Uncontrolled type 2 diabetes mellitus with hyperglycemia (HCC) 01/26/2013   Long term (current) use of anticoagulants 10/11/2012    PCP: Wallace Search PA-C   REFERRING PROVIDER: Gretta Bertrum ORN, PA-C  REFERRING DIAG: Diagnosis M54.12 (ICD-10-CM) - Radiculopathy, cervical region M77.11 (ICD-10-CM) - Lateral epicondylitis, right elbow M75.41 (ICD-10-CM) - Impingement syndrome of right shoulder  THERAPY DIAG:  Chronic right shoulder pain  Cervicalgia  Stiffness of right shoulder, not elsewhere classified  Muscle weakness (generalized)  Rationale for Evaluation and Treatment: Rehabilitation  ONSET DATE: about 3 months ago   SUBJECTIVE:                                                                                                                                                                                       SUBJECTIVE STATEMENT: Doing really well; no complaints   EVAL: I was walking the dog and had a fall, I really fell hard on my right arm. Everything seemed to heal except for my wrist, elbow, and shoulder. Stepped on the side of the road wrong and fell, twisted my ankle and hit my head as well. Having ongoing aching in wrist/elbow/shoulder and its better, got a cortisone shot a couple of weeks ago and that is painful. 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 Having night pain, doesn't seem to matter if I lay on left or right sides but I do have to support my right arm with pillows. Having some right sided neck issues since the fall too, most of my pain is in the arm and shoulder.  Hand dominance: Right  PERTINENT HISTORY: See above   PAIN:  Are you having pain? No 0/10 now, over past week 5/10 at worst   PRECAUTIONS: None  RED FLAGS: None   WEIGHT BEARING RESTRICTIONS: No  FALLS:  Has patient fallen in last 6 months? Yes. 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  DIAGNOSTIC FINDINGS:  IMPRESSION: Mild degenerative change of the  radiocarpal and thumb carpometacarpal joints.     IMPRESSION: Mild to moderate acromioclavicular and trace glenohumeral degenerative spurring.  PATIENT SURVEYS:  FOTO 50, predicted 69 in 9 visits; 07/15/23- 64.5    COGNITION: Overall cognitive status: Within functional limits for tasks assessed     SENSATION: Not tested  POSTURE: Rounded shoulders, forward head   UPPER EXTREMITY ROM:   Active ROM Right eval Left eval Right 07/01/23 AROM 07/08/23 AROM 07/24/23 AROM Right  07/24/23 AROM Left  08/03/23 AROM left  Shoulder flexion 115* 128* 124*  160* 160* 160  Shoulder extension         Shoulder abduction 165* pain 175* 124* more painful that usual   165* 168* 168  Shoulder adduction         Shoulder internal rotation L1 T10 T8  T7 T7 T8  Shoulder external rotation T4  T4 T4  T4 T4 T4  Elbow flexion WNL  WNL        Elbow extension WNL  WNL        Wrist flexion WNL  WNL        Wrist extension WNL  WNL        Wrist ulnar deviation         Wrist radial deviation         Wrist pronation WNL  WNL        Wrist supination WNL  WNL        Cervical flexion  48* ---  60*  60*   Cervical extension  30* ---  44*  45*   Cervical lateral flexion  35* pain 42*  R 30* L 33* 30* 50*   Cervical rotation  42* pain 70*  R 60* L 68* 48* 60*   (Blank rows = not tested)  UPPER EXTREMITY MMT:  MMT Right eval Left eval Right 07/08/23 Left 07/08/23 Right 07/24/23 Left 07/24/23  Shoulder flexion 3- 4+ 3+ 4+ 5 5  Shoulder extension        Shoulder abduction 3- 4+ 3+ 4+ 4 pain 5  Shoulder adduction        Shoulder internal rotation 3- 4+ 4+ 5 5 5   Shoulder external rotation 4 4+ 4+ 5 4+ 5  Middle trapezius        Lower trapezius        Elbow flexion 4+ 4+ 5 5 5 5   Elbow extension 4 4+ 4+ 5 5 5   Wrist flexion        Wrist extension        Wrist ulnar deviation        Wrist radial deviation        Wrist pronation        Wrist supination        Grip strength (lbs)        (Blank  rows = not tested)    PALPATION:  Tender R rotator cuff, upper trap and levator, R delts and pec    TODAY'S TREATMENT:  DATE:  08/05/23 TherEx UBE L2 x 5 min (2.5 min each direction) Standing shoulder flexion to 90 deg bil 2#; 3x10 Standing shoulder abduction to 90 deg bil 2#; 3x10 Standing overhead press 2# bil; 3x10 Standing IR/ER in 90 deg abdct 2# x10 reps bil Standing horizontal abduction/adduction in 90 deg flexion x 10 reps bil; 2#  08/03/23:  TherEx:  UBE: Level 3 x 4 minutes each direction Standing flexion c 2 # x 10  Standing abd c 2 # x 10  Rows; blue TB 2 x 15  holding 3 sec Standing abd stretch using ball on the wall x 10 holding 3 sec end range Lifting 2# weight to 1st and 2nd clinic shelf x 10 each DGI  score 24   07/24/23  Objective MMT/ROM for progress note, education on progress towards goals and POC moving forward   TherEx  UBE L3 x8 minutes backwards only all 4 extremities  Blackburn 6 x12 each 0# Upper trap stretch 2x30 seconds B Levator scap stretch 1x30 seconds B       07/20/23  TherEx  UBE 2.5 x8 minutes backwards only Blackburn 6 x10 each 0# prone  Thoracic excursions x20  Thoracic extensions over double towel roll x10 with BUE flexion    On BACTA weight machine:  - rows 10# x15, 15# x7 - lat pulls 10# x15, 15# x7 - tricep curls 20# x15, 25# x7 - bicep curls 10# x15, 15# x7         PATIENT EDUCATION: Education details: exam findings, POC, HEP, role of PT and exercise in managing pain and improving posture/function  Person educated: Patient Education method: Explanation, Demonstration, and Handouts Education comprehension: verbalized understanding, returned demonstration, and needs further education  HOME EXERCISE PROGRAM: Access Code: [email or web address] URL: [email or web address] Date:  08/05/2023 Prepared by: Corean Ku  Exercises - Scapular Retraction with Resistance  - 1 x daily - 7 x weekly - 2 sets - 10 reps - 2 seconds  hold - Shoulder extension with resistance - Neutral  - 1 x daily - 7 x weekly - 2 sets - 10 reps - 2 seconds  hold - Standing Single Arm Shoulder Flexion Stretch on Wall  - 1 x daily - 7 x weekly - 2 sets - 10 reps - 5 seconds  hold - Standing Backward Shoulder Rolls  - 1 x daily - 7 x weekly - 2 sets - 10 reps - Shoulder External Rotation with Anchored Resistance with Towel Under Elbow  - 1-2 x daily - 7 x weekly - 2 sets - 10 reps - Single Arm Serratus Punches in Supine with Dumbbell  - 1 x daily - 7 x weekly - 1-2 sets - 10 reps - Supine Shoulder Circles with Weight  - 1 x daily - 7 x weekly - 1-2 sets - 10 reps - Supine Shoulder Flexion with Free Weight  - 1 x daily - 7 x weekly - 2 sets - 10 reps - Sidelying Shoulder Abduction Palm Forward  - 1 x daily - 7 x weekly - 2 sets - 10 reps - Sidelying Shoulder ER with Towel and Dumbbell  - 1 x daily - 7 x weekly - 2 sets - 10 reps - Seated Upper Trapezius Stretch  - 2 x daily - 7 x weekly - 1 sets - 2 reps - 30 seconds  hold - Gentle Levator Scapulae Stretch  - 2 x daily - 7 x weekly - 1 sets - 2 reps - 30  seconds  hold - Standing Shoulder Flexion to 90 Degrees with Dumbbells  - 1 x daily - 7 x weekly - 3 sets - 10 reps - Shoulder Overhead Press in Flexion with Dumbbells  - 1 x daily - 7 x weekly - 3 sets - 10 reps - Shoulder Abduction with Dumbbells - Thumbs Up  - 1 x daily - 7 x weekly - 3 sets - 10 reps - Standing Single Shoulder External Rotation in Abduction AROM  - 1 x daily - 7 x weekly - 3 sets - 10 reps - Horizontal Shoulder Abduction and Adduction with Compression Garment  - 1 x daily - 7 x weekly - 3 sets - 10 reps  ASSESSMENT:  CLINICAL IMPRESSION:  08/05/2023 Pt has met/nearly met all goals at this time and is ready for d/c from PT.  She has an extensive HEP that she can continue at  home (recommended breaking up strength exercises into groups and switching up exercises each day).  Will d/c PT today.   EVAL: Patient is a 68 y.o. F who was seen today for physical therapy evaluation and treatment for Diagnosis M54.12 (ICD-10-CM) - Radiculopathy, cervical region M77.11 (ICD-10-CM) - Lateral epicondylitis, right elbow M75.41 (ICD-10-CM) - Impingement syndrome of right shoulder. Exam with objective findings as above, right now her main concern is shoulder pain as the other involved joints have considerably improved, but will continue to monitor. Will benefit from balance screen given that this injury was the result of a fall.    OBJECTIVE IMPAIRMENTS: decreased mobility, decreased ROM, decreased strength, hypomobility, increased fascial restrictions, increased muscle spasms, impaired flexibility, impaired UE functional use, postural dysfunction, and pain.   ACTIVITY LIMITATIONS: carrying, lifting, bathing, toileting, dressing, reach over head, hygiene/grooming, and caring for others  PARTICIPATION LIMITATIONS: meal prep, cleaning, laundry, shopping, community activity, occupation, yard work, and church  PERSONAL FACTORS: Age, Behavior pattern, Education, Financial Risk Analyst, Past/current experiences, Profession, Sex, Social background, and Time since onset of injury/illness/exacerbation are also affecting patient's functional outcome.   REHAB POTENTIAL: Good  CLINICAL DECISION MAKING: Stable/uncomplicated  EVALUATION COMPLEXITY: Low   GOALS: Goals reviewed with patient? No  SHORT TERM GOALS: Target date:  recert 07/24/23 at this point STGs = LTGs     Will be compliant with appropriate progressive HEP  Baseline: Goal status: MET 07/08/23  2.  AROM R shoulder to be equal to that of the left and pain free  Baseline:  Goal status: MET 07/24/23  3.  Cervical AROM to be symmetrical and pain free  Baseline:  Goal status: MET 08/03/23  4.  Will demonstrate improved awareness of  posture with functional task performance and use of postural aides PRN  Baseline:  Goal status: MET 07/24/23   LONG TERM GOALS: Target date: 08/14/2023      MMT to be equal and symmetrical in BUEs  Baseline:  Goal status: MET 08/03/23  2.  Pain to be no more than 2/10 at worst R UE  Baseline:  Goal status: PARTIALLY MET (2-3/10) 08/05/23  3.  Will score at least 20/24 on DGI to show reduced fall risk  Baseline:  Goal status: MET 08/03/23  4.  Will be able to perform all functional work and recreational tasks without increase from resting pain levels including reaching overhead and lifting chairs at work  Baseline:  Goal status: MET 07/24/23  5.  FOTO score to be within 5 points of predicted by time of DC  Baseline:  Goal status: MET 07/15/23  PLAN:  PT FREQUENCY: Discharge next visit  PT DURATION: 3 weeks  PLANNED INTERVENTIONS: 97164- PT Re-evaluation, 97110-Therapeutic exercises, 97530- Therapeutic activity, 97112- Neuromuscular re-education, 97535- Self Care, 02859- Manual therapy, 97014- Electrical stimulation (unattended), Patient/Family education, Balance training, Dry Needling  PLAN FOR NEXT SESSION: d/c PT today  Corean JULIANNA Ku, PT, DPT 08/05/23 2:54 PM      PHYSICAL THERAPY DISCHARGE SUMMARY  Visits from Start of Care: 12  Current functional level related to goals / functional outcomes: See above   Remaining deficits: See above   Education / Equipment: HEP   Patient agrees to discharge. Patient goals were met. Patient is being discharged due to meeting the stated rehab goals.  Corean JULIANNA Ku, PT, DPT 08/05/23 2:54 PM  [facility] [address] [place], [place], [address] Phone: [phone]   Fax:  [phone]

## 2023-08-07 ENCOUNTER — Other Ambulatory Visit: Payer: Self-pay | Admitting: Family Medicine

## 2023-08-07 DIAGNOSIS — E1165 Type 2 diabetes mellitus with hyperglycemia: Secondary | ICD-10-CM

## 2023-08-10 ENCOUNTER — Encounter: Payer: Medicare PPO | Admitting: Physical Therapy

## 2023-08-12 ENCOUNTER — Encounter: Payer: Medicare PPO | Admitting: Physical Therapy

## 2023-08-28 ENCOUNTER — Encounter: Payer: Self-pay | Admitting: Family Medicine

## 2023-08-28 ENCOUNTER — Ambulatory Visit
Admission: RE | Admit: 2023-08-28 | Discharge: 2023-08-28 | Disposition: A | Discharge status: Home / Self Care | Payer: Medicare PPO | Source: Ambulatory Visit | Attending: Family Medicine

## 2023-08-28 DIAGNOSIS — M8588 Other specified disorders of bone density and structure, other site: Secondary | ICD-10-CM | POA: Diagnosis not present

## 2023-08-28 DIAGNOSIS — N958 Other specified menopausal and perimenopausal disorders: Secondary | ICD-10-CM | POA: Diagnosis not present

## 2023-08-28 DIAGNOSIS — E2839 Other primary ovarian failure: Secondary | ICD-10-CM | POA: Diagnosis not present

## 2023-08-28 DIAGNOSIS — Z13820 Encounter for screening for osteoporosis: Secondary | ICD-10-CM

## 2023-09-01 ENCOUNTER — Ambulatory Visit: Payer: Medicare PPO | Attending: Cardiology | Admitting: *Deleted

## 2023-09-01 DIAGNOSIS — I82409 Acute embolism and thrombosis of unspecified deep veins of unspecified lower extremity: Secondary | ICD-10-CM | POA: Diagnosis not present

## 2023-09-01 DIAGNOSIS — Z7901 Long term (current) use of anticoagulants: Secondary | ICD-10-CM | POA: Diagnosis not present

## 2023-09-01 LAB — POCT INR: INR: 4.2 — AB (ref 2.0–3.0)

## 2023-09-01 NOTE — Patient Instructions (Signed)
Description   Do not take any warfarin today then START taking warfarin 1 tablet daily except 1.5 tablets each Monday. Repeat INR in 4 weeks. [facility]in [facility] [phone]

## 2023-09-03 ENCOUNTER — Encounter: Payer: Self-pay | Admitting: Family Medicine

## 2023-09-03 ENCOUNTER — Other Ambulatory Visit: Payer: Self-pay | Admitting: Family Medicine

## 2023-09-03 ENCOUNTER — Other Ambulatory Visit: Payer: Self-pay

## 2023-09-03 DIAGNOSIS — E1169 Type 2 diabetes mellitus with other specified complication: Secondary | ICD-10-CM

## 2023-09-03 DIAGNOSIS — E039 Hypothyroidism, unspecified: Secondary | ICD-10-CM

## 2023-09-03 DIAGNOSIS — I152 Hypertension secondary to endocrine disorders: Secondary | ICD-10-CM

## 2023-09-03 DIAGNOSIS — G2581 Restless legs syndrome: Secondary | ICD-10-CM

## 2023-09-14 ENCOUNTER — Other Ambulatory Visit: Payer: Medicare PPO

## 2023-09-14 ENCOUNTER — Ambulatory Visit (INDEPENDENT_AMBULATORY_CARE_PROVIDER_SITE_OTHER): Payer: Medicare PPO | Admitting: Family Medicine

## 2023-09-14 ENCOUNTER — Ambulatory Visit: Payer: Self-pay | Admitting: Family Medicine

## 2023-09-14 ENCOUNTER — Ambulatory Visit (HOSPITAL_BASED_OUTPATIENT_CLINIC_OR_DEPARTMENT_OTHER)
Admission: RE | Admit: 2023-09-14 | Discharge: 2023-09-14 | Disposition: A | Discharge status: Home / Self Care | Payer: Medicare PPO | Source: Ambulatory Visit | Attending: Family Medicine | Admitting: Family Medicine

## 2023-09-14 VITALS — BP 134/74 | HR 80 | Ht 63.0 in | Wt 170.0 lb

## 2023-09-14 DIAGNOSIS — I700 Atherosclerosis of aorta: Secondary | ICD-10-CM | POA: Diagnosis not present

## 2023-09-14 DIAGNOSIS — S0990XA Unspecified injury of head, initial encounter: Secondary | ICD-10-CM | POA: Insufficient documentation

## 2023-09-14 DIAGNOSIS — R0781 Pleurodynia: Secondary | ICD-10-CM | POA: Diagnosis not present

## 2023-09-14 DIAGNOSIS — R519 Headache, unspecified: Secondary | ICD-10-CM | POA: Diagnosis not present

## 2023-09-14 NOTE — Patient Instructions (Signed)
It was nice to see you today,  We addressed the following topics today: -I have ordered a stat CT of your head and chest.  If there is anything abnormal on the findings we will let you know and likely advise you to go to the emergency department - If you notice any of the findings we discussed such as blurry vision, vision changes, worsening headache, dizziness or confusion, slurring, one-sided weakness then have someone take you to the emergency department  Have a great day,  Frederic Jericho, MD

## 2023-09-14 NOTE — Telephone Encounter (Signed)
Chief Complaint: Fall  Symptoms: hit head on the right side, knot above the right eyebrow, headache Frequency: fell today Pertinent Negatives: Patient denies fever, nausea, vomiting, memory loss Disposition: [] ED /[] Urgent Care (no appt availability in office) / [] Appointment(In office/virtual)/ []  [facility]/ [] Home Care/ [] Refused Recommended Disposition /[] [facility]/ []  Follow-up with PCP Additional Notes: Patient states she was walking her dog this morning and tripped on the sidewalk causing her to fall. She hit her head on the right side of the front of the head, landing on her right hand and knee as well. Patient reports a large knot above the right eyebrow and a headache. Patient is home with her daughter right now. Care advice was given and patient has been scheduled for an appointment today.   Copied from CRM [id number]. Topic: Clinical - Red Word Triage >> Sep 14, 2023 10:54 AM Dennison Nancy wrote: Red Word that prompted transfer to Nurse Triage: today was walking her dog and fell and hit her head and in a lot of pain  have a large knot the size of palm of her hand Reason for Disposition  [1] MODERATE weakness (i.e., interferes with work, school, normal activities) AND [2] new-onset or worsening  Answer Assessment - Initial Assessment Questions 1. MECHANISM: "How did the fall happen?"     I was walking my dog and I think I tripped  2. DOMESTIC VIOLENCE AND ELDER ABUSE SCREENING: "Did you fall because someone pushed you or tried to hurt you?" If Yes, ask: "Are you safe now?"     No  3. ONSET: "When did the fall happen?" (e.g., minutes, hours, or days ago)     Today  4. LOCATION: "What part of the body hit the ground?" (e.g., back, buttocks, head, hips, knees, hands, head, stomach)     My head and knee  5. INJURY: "Did you hurt (injure) yourself when you fell?" If Yes, ask: "What did you injure? Tell me more about this?" (e.g., body area; type of injury; pain  severity)"     Yes, I have a large knot on the right side of my head  6. PAIN: "Is there any pain?" If Yes, ask: "How bad is the pain?" (e.g., Scale 1-10; or mild,  moderate, severe)   - NONE (0): No pain   - MILD (1-3): Doesn't interfere with normal activities    - MODERATE (4-7): Interferes with normal activities or awakens from sleep    - SEVERE (8-10): Excruciating pain, unable to do any normal activities      6/10 7. SIZE: For cuts, bruises, or swelling, ask: "How large is it?" (e.g., inches or centimeters)      Swelling above the right eyebrow  9. OTHER SYMPTOMS: "Do you have any other symptoms?" (e.g., dizziness, fever, weakness; new onset or worsening).      Headache  10. CAUSE: "What do you think caused the fall (or falling)?" (e.g., tripped, dizzy spell)       Tripped  Protocols used: Falls and [facility]

## 2023-09-14 NOTE — Assessment & Plan Note (Signed)
Had a fall earlier this morning and is on anticoagulation.  She called and was set up with an appointment with me.  Explained to her that the ED is the appropriate setting for patients who have fallen and hit her head and who are on anticoagulation.  Given the recent long wait times associated with flu season and risk of contracting influenza or COVID while waiting in the ED I agreed to see her this afternoon after calling her at lunch to see if she was having any neurologic symptoms.  She is accompanied by her children.  She did not have any focal deficits on exam and her only significant complaint was headache that appeared to be improving.  Ordered a stat CT head which patient will get done after she leaves the office.  Advised her on red flag symptoms and reasons to seek immediate medical attention in the emergency department.  Recommend she continue holding off her warfarin until I advised her it is okay after we have examined the CT scan.

## 2023-09-14 NOTE — Assessment & Plan Note (Signed)
Will get CT scan of the chest in addition to the CT head given her tenderness and pain with inspiration and coughing.  Lungs sounded clear.

## 2023-09-14 NOTE — Progress Notes (Signed)
Acute Office Visit  Subjective:     Patient ID: Katrina Obrien, female    DOB: [birth year], 68 y.o.   MRN: [id number]  Chief Complaint  Patient presents with   Fall    HPI Patient fell earlier today on cement.  She had just come home from [address] and was letting the dog out to go to the bathroom.  She is uncertain how she fell but she initially landed on her outstretched right arm and then hit her forehead on the concrete.  She is currently having a headache but this is improving.  The swelling on the forehead improved as well.  Patient is accompanied by her daughters.  She is denying vision changes, aphasia, slurring, focal deficits.  she takes 1 tablet of the 2 mg warfarin daily with 1.5 tablets on Mondays.  She did not take any warfarin today.  Patient also complaining upon some rib pain on the right side that is more painful with breathing or laughing.  No shortness of breath.  Patient denies wrist pain.  Does have some shoulder pain but also has some chronic shoulder pain from previous injury.  ROS      Objective:    BP 134/74   Pulse 80   Ht 5\' 3"  (1.6 m)   Wt 170 lb (77.1 kg)   LMP  (LMP Unknown)   BMI 30.11 kg/m    Physical Exam General: Alert, oriented HEENT: PERRLA, EOMI, moist mucosa.  Peripheral vision intact.  Mild swelling and bruising over the right forehead CV: Regular rate rhythm no murmurs Pulmonary lungs clear bilaterally Neuro: Cranial nerves II [place] grossly intact.  Sensation intact bilaterally.  No weakness.  5/5 strength equal bilaterally in the upper and lower extremities. MSK: Mild tenderness to palpation over the right wrist.  Some tenderness palpation over the right ribs and right deltoid.  Normal shoulder range of motion  No results found for any visits on 09/14/23.      Assessment & Plan:   Injury of head, initial encounter Assessment & Plan: Had a fall earlier this morning and is on anticoagulation.  She called and was  set up with an appointment with me.  Explained to her that the ED is the appropriate setting for patients who have fallen and hit her head and who are on anticoagulation.  Given the recent long wait times associated with flu season and risk of contracting influenza or COVID while waiting in the ED I agreed to see her this afternoon after calling her at lunch to see if she was having any neurologic symptoms.  She is accompanied by her children.  She did not have any focal deficits on exam and her only significant complaint was headache that appeared to be improving.  Ordered a stat CT head which patient will get done after she leaves the office.  Advised her on red flag symptoms and reasons to seek immediate medical attention in the emergency department.  Recommend she continue holding off her warfarin until I advised her it is okay after we have examined the CT scan.  Orders: -     CT HEAD WO CONTRAST ( ); Future  Rib pain Assessment & Plan: Will get CT scan of the chest in addition to the CT head given her tenderness and pain with inspiration and coughing.  Lungs sounded clear.  Orders: -     CT CHEST WO CONTRAST; Future     Return if symptoms worsen or fail to  improve.  Sandre Kitty, MD

## 2023-09-15 ENCOUNTER — Encounter: Payer: Self-pay | Admitting: Family Medicine

## 2023-09-15 ENCOUNTER — Other Ambulatory Visit: Payer: Medicare PPO

## 2023-09-15 DIAGNOSIS — E1159 Type 2 diabetes mellitus with other circulatory complications: Secondary | ICD-10-CM | POA: Diagnosis not present

## 2023-09-15 DIAGNOSIS — E1169 Type 2 diabetes mellitus with other specified complication: Secondary | ICD-10-CM

## 2023-09-15 DIAGNOSIS — E785 Hyperlipidemia, unspecified: Secondary | ICD-10-CM | POA: Diagnosis not present

## 2023-09-15 DIAGNOSIS — I152 Hypertension secondary to endocrine disorders: Secondary | ICD-10-CM | POA: Diagnosis not present

## 2023-09-16 ENCOUNTER — Encounter: Payer: Self-pay | Admitting: Family Medicine

## 2023-09-16 LAB — COMPREHENSIVE METABOLIC PANEL
ALT: 32 [IU]/L (ref 0–32)
AST: 18 [IU]/L (ref 0–40)
Albumin: 4.3 g/dL (ref 3.9–4.9)
Alkaline Phosphatase: 72 [IU]/L (ref 44–121)
BUN/Creatinine Ratio: 24 (ref 12–28)
BUN: 17 mg/dL (ref 8–27)
Bilirubin Total: 0.6 mg/dL (ref 0.0–1.2)
CO2: 19 mmol/L — ABNORMAL LOW (ref 20–29)
Calcium: 9.7 mg/dL (ref 8.7–10.3)
Chloride: 103 mmol/L (ref 96–106)
Creatinine, Ser: 0.7 mg/dL (ref 0.57–1.00)
Globulin, Total: 2.5 g/dL (ref 1.5–4.5)
Glucose: 165 mg/dL — ABNORMAL HIGH (ref 70–99)
Potassium: 4.2 mmol/L (ref 3.5–5.2)
Sodium: 141 mmol/L (ref 134–144)
Total Protein: 6.8 g/dL (ref 6.0–8.5)
eGFR: 95 mL/min/{1.73_m2} (ref >59)

## 2023-09-16 LAB — LIPID PANEL
Chol/HDL Ratio: 2.5 {ratio} (ref 0.0–4.4)
Cholesterol, Total: 109 mg/dL (ref 100–199)
HDL: 43 mg/dL (ref >39)
LDL Chol Calc (NIH): 44 mg/dL (ref 0–99)
Triglycerides: 127 mg/dL (ref 0–149)
VLDL Cholesterol Cal: 22 mg/dL (ref 5–40)

## 2023-09-16 LAB — HEMOGLOBIN A1C
Est. average glucose Bld gHb Est-mCnc: 166 mg/dL
Hgb A1c MFr Bld: 7.4 % — ABNORMAL HIGH (ref 4.8–5.6)

## 2023-09-17 ENCOUNTER — Encounter: Payer: Self-pay | Admitting: Family Medicine

## 2023-09-17 ENCOUNTER — Other Ambulatory Visit: Payer: Medicare PPO

## 2023-09-17 ENCOUNTER — Other Ambulatory Visit: Payer: Self-pay | Admitting: Family Medicine

## 2023-09-17 DIAGNOSIS — S0990XD Unspecified injury of head, subsequent encounter: Secondary | ICD-10-CM

## 2023-09-17 NOTE — Telephone Encounter (Signed)
Pt is scheduled to come here tomorrow for the lab.

## 2023-09-17 NOTE — Telephone Encounter (Signed)
I told her she can get the INR done here if she cannot get it done at the coumadin clinic

## 2023-09-17 NOTE — Telephone Encounter (Signed)
Copied from CRM [id number]. Topic: Appointments - Scheduling Inquiry for Clinic >> Sep 17, 2023  1:25 PM Dondra Prader E wrote: Reason for CRM: Pt called to schedule an INR check, please advise

## 2023-09-17 NOTE — Progress Notes (Signed)
Ordering repeat head CT.  Initial 1 was normal.  Patient not having any new neurologic symptoms but did have new bruising and swelling on the right eyelid.  Also advised patient to call her warfarin clinic to schedule a INR check if she can today or tomorrow but if she cannot reach them she can get her INR checked with [place] tomorrow.

## 2023-09-18 ENCOUNTER — Other Ambulatory Visit: Payer: Self-pay | Admitting: Family Medicine

## 2023-09-18 ENCOUNTER — Other Ambulatory Visit: Payer: Medicare PPO

## 2023-09-18 DIAGNOSIS — Z7901 Long term (current) use of anticoagulants: Secondary | ICD-10-CM | POA: Diagnosis not present

## 2023-09-19 ENCOUNTER — Encounter: Payer: Self-pay | Admitting: Family Medicine

## 2023-09-19 ENCOUNTER — Ambulatory Visit
Admission: RE | Admit: 2023-09-19 | Discharge: 2023-09-19 | Disposition: A | Discharge status: Home / Self Care | Payer: Medicare PPO | Source: Ambulatory Visit | Attending: Family Medicine | Admitting: Family Medicine

## 2023-09-19 DIAGNOSIS — Z7901 Long term (current) use of anticoagulants: Secondary | ICD-10-CM | POA: Diagnosis not present

## 2023-09-19 DIAGNOSIS — S098XXA Other specified injuries of head, initial encounter: Secondary | ICD-10-CM | POA: Diagnosis not present

## 2023-09-19 DIAGNOSIS — S0990XD Unspecified injury of head, subsequent encounter: Secondary | ICD-10-CM

## 2023-09-19 LAB — PROTIME-INR
INR: 2.7 — ABNORMAL HIGH (ref 0.9–1.2)
Prothrombin Time: 28.4 s — ABNORMAL HIGH (ref 9.1–12.0)

## 2023-09-21 ENCOUNTER — Ambulatory Visit (INDEPENDENT_AMBULATORY_CARE_PROVIDER_SITE_OTHER): Payer: Medicare PPO | Admitting: Family Medicine

## 2023-09-21 ENCOUNTER — Encounter: Payer: Self-pay | Admitting: Family Medicine

## 2023-09-21 ENCOUNTER — Ambulatory Visit (INDEPENDENT_AMBULATORY_CARE_PROVIDER_SITE_OTHER): Payer: Self-pay | Admitting: Cardiovascular Disease

## 2023-09-21 VITALS — BP 113/70 | HR 69 | Temp 97.8°F | Ht 63.0 in | Wt 169.0 lb

## 2023-09-21 DIAGNOSIS — E785 Hyperlipidemia, unspecified: Secondary | ICD-10-CM

## 2023-09-21 DIAGNOSIS — Z7984 Long term (current) use of oral hypoglycemic drugs: Secondary | ICD-10-CM | POA: Diagnosis not present

## 2023-09-21 DIAGNOSIS — E1159 Type 2 diabetes mellitus with other circulatory complications: Secondary | ICD-10-CM | POA: Diagnosis not present

## 2023-09-21 DIAGNOSIS — E1165 Type 2 diabetes mellitus with hyperglycemia: Secondary | ICD-10-CM

## 2023-09-21 DIAGNOSIS — Z7901 Long term (current) use of anticoagulants: Secondary | ICD-10-CM

## 2023-09-21 DIAGNOSIS — L02412 Cutaneous abscess of left axilla: Secondary | ICD-10-CM | POA: Diagnosis not present

## 2023-09-21 DIAGNOSIS — E1169 Type 2 diabetes mellitus with other specified complication: Secondary | ICD-10-CM | POA: Diagnosis not present

## 2023-09-21 DIAGNOSIS — M810 Age-related osteoporosis without current pathological fracture: Secondary | ICD-10-CM | POA: Insufficient documentation

## 2023-09-21 DIAGNOSIS — I152 Hypertension secondary to endocrine disorders: Secondary | ICD-10-CM

## 2023-09-21 MED ORDER — FUROSEMIDE 40 MG PO TABS: 20.0000 mg | ORAL_TABLET | Freq: Every day | ORAL | 2 refills | Status: DC

## 2023-09-21 MED ORDER — SULFAMETHOXAZOLE-TRIMETHOPRIM 800-160 MG PO TABS: 1.0000 | ORAL_TABLET | Freq: Two times a day (BID) | ORAL | 0 refills | Status: AC

## 2023-09-21 NOTE — Progress Notes (Signed)
 Established Patient Office Visit  Subjective   Patient ID: Katrina Obrien, female    DOB: January 12, 1956  Age: 68 y.o. MRN: [id number]  Chief Complaint  Patient presents with   Follow-up    HPI Katrina Obrien is a 68 y.o. female presenting today for follow up of hypertension, hyperlipidemia, diabetes.  She also notes that she has had a chronic cyst under her arm that burst within the past week. Hypertension:  Pt denies chest pain, SOB, dizziness, edema, syncope, fatigue or heart palpitations. Taking furosemide, metoprolol, sacubitril-valsartan, reports excellent compliance with treatment. Denies side effects. Hyperlipidemia: tolerating atorvastatin 40 mg daily well with no myalgias or significant side effects.  The ASCVD Risk score (Arnett DK, et al., 2019) failed to calculate for the following reasons:   The valid total cholesterol range is 130 to 320 mg/dL Diabetes: denies hypoglycemic events, wounds or sores that are not healing well, increased thirst or urination. Denies vision problems, eye exam up-to-date.  Taking maximum dose of Ozempic 2 mg weekly and Jardiance 25 mg daily as prescribed without any side effects.  She saw her A1c results and knows that it is likely because she has been eating too many carbs like bread.  Outpatient Medications Prior to Visit  Medication Sig   aspirin EC 81 MG tablet Take 81 mg by mouth daily.   atorvastatin (LIPITOR) 40 MG tablet TAKE 1 TABLET (40 MG TOTAL) BY MOUTH DAILY.   empagliflozin (JARDIANCE) 25 MG TABS tablet Take 1 tablet (25 mg total) by mouth daily.   Evolocumab (REPATHA SURECLICK) 140 MG/ML SOAJ Inject 140 mg into the skin every 14 (fourteen) days.   fluticasone (FLONASE) 50 MCG/ACT nasal spray Place 2 sprays into both nostrils daily.   levothyroxine (SYNTHROID) 50 MCG tablet TAKE 1 TABLET (50 MCG TOTAL) BY MOUTH DAILY BEFORE BREAKFAST.   metoprolol succinate (TOPROL-XL) 100 MG 24 hr tablet TAKE 1 TABLET BY MOUTH DAILY. TAKE WITH OR  IMMEDIATELY FOLLOWING A MEAL.   pregabalin (LYRICA) 75 MG capsule TAKE 1 CAPSULE (75 MG TOTAL) BY MOUTH DAILY. TAKE 1 TO 3 HOURS BEFORE BEDTIME. CAN INCREASE TO 2 CAPSULES DAILY.   sacubitril-valsartan (ENTRESTO) 24-26 MG TAKE 1 TABLET BY MOUTH TWICE A DAY   Semaglutide, 2 MG/DOSE, (OZEMPIC, 2 MG/DOSE,) 8 MG/3ML SOPN INJECT 2 MG INTO THE SKIN ONCE A WEEK.   VITAMIN D PO Take by mouth.   warfarin (COUMADIN) 2 MG tablet TAKE 1 TO 1 AND 1/2 TABLETS BY MOUTH DAILY AS DIRECTED BY COUMADIN CLINIC.   [DISCONTINUED] furosemide (LASIX) 40 MG tablet TAKE 1/2 TABLET BY MOUTH DAILY.   No facility-administered medications prior to visit.    ROS Negative unless otherwise noted in HPI   Objective:     BP 113/70   Pulse 69   Temp 97.8 F (36.6 C) (Temporal)   Ht 5\' 3"  (1.6 m)   Wt 169 lb (76.7 kg)   LMP  (LMP Unknown)   SpO2 100%   BMI 29.94 kg/m   Physical Exam Constitutional:      General: She is not in acute distress.    Appearance: Normal appearance.  HENT:     Head: Normocephalic and atraumatic.  Cardiovascular:     Rate and Rhythm: Normal rate and regular rhythm.     Heart sounds: No murmur heard.    No friction rub. No gallop.  Pulmonary:     Effort: Pulmonary effort is normal. No respiratory distress.     Breath sounds: No  wheezing, rhonchi or rales.  Skin:    General: Skin is warm and dry.     Findings: Abscess (0.5 cm diameter, minimal purulence drained, no surrounding erythema or warmth) present.  Neurological:     Mental Status: She is alert and oriented to person, place, and time.      Assessment & Plan:  Hypertension associated with diabetes (HCC) Assessment & Plan: BP goal <130/80. Stable.  Continue follow-up with cardiology, furosemide 40 mg daily, metoprolol 100 mg daily, sacubitril-valsartan 24-26 mg daily. CMP within normal limits. Will continue to monitor.  Orders: -     Furosemide; Take 0.5 tablets (20 mg total) by mouth daily.  Dispense: 45 tablet; Refill:  2  Hyperlipidemia associated with type 2 diabetes mellitus (HCC) Assessment & Plan: Last lipid panel: LDL 44, HDL 43, triglycerides 127.  Continue atorvastatin 40 mg daily, will continue to monitor.   Uncontrolled type 2 diabetes mellitus with hyperglycemia (HCC) Assessment & Plan: A1c further increased to 7.4.  Continue Ozempic to 2 mg weekly, continue Jardiance 25 mg daily.  Patient will work on the dietary interventions that she knows that she should be making but has not been as consistent with the past few months.  Will continue to monitor.  BUN/creatinine ratio did resolved after stopping metformin in addition to her diarrhea.     Age-related osteoporosis without current pathological fracture Assessment & Plan: T-score -1.8 on 08/28/2023.  Recommend to start calcium supplement and continue vitamin D supplement.  Given that her warfarin dosage is currently in need of adjustment after stopping metformin resulted in the resolution of her chronic diarrhea, wait until her next appointment/warfarin dosage to be stable prior to discussing the possibility of starting bisphosphonate.   Abscess of axilla, left -     Sulfamethoxazole-Trimethoprim; Take 1 tablet by mouth 2 (two) times daily for 3 days.  Dispense: 6 tablet; Refill: 0  Continue to keep left axilla clean and dry, avoid deodorant and other topical products.  Minimal purulence drained through opening in the office even with pressure, recommend short course of Bactrim to cover any lingering infection.  Return in about 4 months (around 01/19/2024) for follow-up for DM, osteoporosis, fasting labs 1 week before.    Melida Quitter, PA

## 2023-09-21 NOTE — Assessment & Plan Note (Signed)
 T-score -1.8 on 08/28/2023.  Recommend to start calcium supplement and continue vitamin D supplement.  Given that her warfarin dosage is currently in need of adjustment after stopping metformin resulted in the resolution of her chronic diarrhea, wait until her next appointment/warfarin dosage to be stable prior to discussing the possibility of starting bisphosphonate.

## 2023-09-21 NOTE — Patient Instructions (Addendum)
 After looking into it, you will need to start a dedicated calcium supplement. You can start a calcium supplement, or find one of the combination calcium+vitamin D supplements.  Calcium: total 1200 mg daily (food + supplement total) Vitamin D: 1000-2000 units daily. There are several products over-the-counter such as Caltrate D and Viactiv chews which are great options that contain calcium and vitamin D.  Start with this, and once your warfarin dosage is stable we may also consider a medicine to further strengthen your bones.

## 2023-09-21 NOTE — Assessment & Plan Note (Signed)
 Last lipid panel: LDL 44, HDL 43, triglycerides 127.  Continue atorvastatin 40 mg daily, will continue to monitor.

## 2023-09-21 NOTE — Assessment & Plan Note (Signed)
 BP goal <130/80. Stable.  Continue follow-up with cardiology, furosemide 40 mg daily, metoprolol 100 mg daily, sacubitril-valsartan 24-26 mg daily. CMP within normal limits. Will continue to monitor.

## 2023-09-21 NOTE — Assessment & Plan Note (Addendum)
 A1c further increased to 7.4.  Continue Ozempic to 2 mg weekly, continue Jardiance 25 mg daily.  Patient will work on the dietary interventions that she knows that she should be making but has not been as consistent with the past few months.  Will continue to monitor.  BUN/creatinine ratio did resolved after stopping metformin in addition to her diarrhea.

## 2023-09-28 ENCOUNTER — Other Ambulatory Visit: Payer: Medicare PPO

## 2023-10-12 ENCOUNTER — Other Ambulatory Visit: Payer: Self-pay | Admitting: Family Medicine

## 2023-10-12 DIAGNOSIS — E1165 Type 2 diabetes mellitus with hyperglycemia: Secondary | ICD-10-CM

## 2023-10-19 ENCOUNTER — Ambulatory Visit: Payer: Medicare PPO | Attending: Internal Medicine | Admitting: *Deleted

## 2023-10-19 DIAGNOSIS — Z7901 Long term (current) use of anticoagulants: Secondary | ICD-10-CM | POA: Diagnosis not present

## 2023-10-19 DIAGNOSIS — Z5181 Encounter for therapeutic drug level monitoring: Secondary | ICD-10-CM | POA: Diagnosis not present

## 2023-10-19 LAB — POCT INR: POC INR: 3.6

## 2023-10-19 NOTE — Patient Instructions (Signed)
 Description   Take 1 tablet today,  Then continue taking warfarin 1 tablet daily except 1.5 tablets each Monday. Repeat INR in 4 weeks. [facility] [phone]

## 2023-11-03 ENCOUNTER — Other Ambulatory Visit: Payer: Self-pay | Admitting: Internal Medicine

## 2023-11-03 DIAGNOSIS — Z951 Presence of aortocoronary bypass graft: Secondary | ICD-10-CM

## 2023-11-03 DIAGNOSIS — E7849 Other hyperlipidemia: Secondary | ICD-10-CM

## 2023-11-16 ENCOUNTER — Ambulatory Visit: Attending: Internal Medicine

## 2023-11-16 DIAGNOSIS — Z7901 Long term (current) use of anticoagulants: Secondary | ICD-10-CM | POA: Diagnosis not present

## 2023-11-16 LAB — POCT INR: INR: 3.4 — AB (ref 2.0–3.0)

## 2023-11-16 NOTE — Patient Instructions (Signed)
 continue taking warfarin 1 tablet daily except 1.5 tablets each Monday. Repeat INR in 6 weeks. [facility]madin  Clinic [phone]

## 2023-11-20 ENCOUNTER — Emergency Department (HOSPITAL_COMMUNITY)
Admission: EM | Admit: 2023-11-20 | Discharge: 2023-11-20 | Disposition: A | Discharge status: Home / Self Care | Attending: Emergency Medicine | Admitting: Emergency Medicine

## 2023-11-20 ENCOUNTER — Other Ambulatory Visit: Payer: Self-pay

## 2023-11-20 ENCOUNTER — Emergency Department (HOSPITAL_COMMUNITY)

## 2023-11-20 DIAGNOSIS — R55 Syncope and collapse: Secondary | ICD-10-CM | POA: Diagnosis not present

## 2023-11-20 DIAGNOSIS — R748 Abnormal levels of other serum enzymes: Secondary | ICD-10-CM | POA: Diagnosis not present

## 2023-11-20 DIAGNOSIS — R112 Nausea with vomiting, unspecified: Secondary | ICD-10-CM | POA: Diagnosis not present

## 2023-11-20 DIAGNOSIS — I2510 Atherosclerotic heart disease of native coronary artery without angina pectoris: Secondary | ICD-10-CM | POA: Insufficient documentation

## 2023-11-20 DIAGNOSIS — R42 Dizziness and giddiness: Secondary | ICD-10-CM | POA: Insufficient documentation

## 2023-11-20 DIAGNOSIS — E119 Type 2 diabetes mellitus without complications: Secondary | ICD-10-CM | POA: Insufficient documentation

## 2023-11-20 DIAGNOSIS — R110 Nausea: Secondary | ICD-10-CM | POA: Insufficient documentation

## 2023-11-20 DIAGNOSIS — Z7901 Long term (current) use of anticoagulants: Secondary | ICD-10-CM | POA: Diagnosis not present

## 2023-11-20 DIAGNOSIS — R1114 Bilious vomiting: Secondary | ICD-10-CM

## 2023-11-20 DIAGNOSIS — I10 Essential (primary) hypertension: Secondary | ICD-10-CM | POA: Diagnosis not present

## 2023-11-20 DIAGNOSIS — K859 Acute pancreatitis without necrosis or infection, unspecified: Secondary | ICD-10-CM | POA: Diagnosis not present

## 2023-11-20 DIAGNOSIS — I447 Left bundle-branch block, unspecified: Secondary | ICD-10-CM | POA: Diagnosis not present

## 2023-11-20 DIAGNOSIS — K5730 Diverticulosis of large intestine without perforation or abscess without bleeding: Secondary | ICD-10-CM | POA: Diagnosis not present

## 2023-11-20 DIAGNOSIS — K5792 Diverticulitis of intestine, part unspecified, without perforation or abscess without bleeding: Secondary | ICD-10-CM | POA: Diagnosis not present

## 2023-11-20 DIAGNOSIS — R197 Diarrhea, unspecified: Secondary | ICD-10-CM | POA: Diagnosis not present

## 2023-11-20 LAB — COMPREHENSIVE METABOLIC PANEL WITH GFR
ALT: 26 U/L (ref 0–44)
AST: 20 U/L (ref 15–41)
Albumin: 4 g/dL (ref 3.5–5.0)
Alkaline Phosphatase: 36 U/L — ABNORMAL LOW (ref 38–126)
Anion gap: 15 (ref 5–15)
BUN: 27 mg/dL — ABNORMAL HIGH (ref 8–23)
CO2: 15 mmol/L — ABNORMAL LOW (ref 22–32)
Calcium: 9.3 mg/dL (ref 8.9–10.3)
Chloride: 106 mmol/L (ref 98–111)
Creatinine, Ser: 0.76 mg/dL (ref 0.44–1.00)
GFR, Estimated: >60 mL/min (ref >60)
Glucose, Bld: 223 mg/dL — ABNORMAL HIGH (ref 70–99)
Potassium: 3.4 mmol/L — ABNORMAL LOW (ref 3.5–5.1)
Sodium: 136 mmol/L (ref 135–145)
Total Bilirubin: 1 mg/dL (ref 0.0–1.2)
Total Protein: 6.4 g/dL — ABNORMAL LOW (ref 6.5–8.1)

## 2023-11-20 LAB — CBC WITH DIFFERENTIAL/PLATELET
Abs Immature Granulocytes: 0.03 10*3/uL (ref 0.00–0.07)
Basophils Absolute: 0 10*3/uL (ref 0.0–0.1)
Basophils Relative: 1 %
Eosinophils Absolute: 0.1 10*3/uL (ref 0.0–0.5)
Eosinophils Relative: 1 %
HCT: 39 % (ref 36.0–46.0)
Hemoglobin: 12.8 g/dL (ref 12.0–15.0)
Immature Granulocytes: 1 %
Lymphocytes Relative: 32 %
Lymphs Abs: 1.9 10*3/uL (ref 0.7–4.0)
MCH: 31.5 pg (ref 26.0–34.0)
MCHC: 32.8 g/dL (ref 30.0–36.0)
MCV: 96.1 fL (ref 80.0–100.0)
Monocytes Absolute: 0.4 10*3/uL (ref 0.1–1.0)
Monocytes Relative: 7 %
Neutro Abs: 3.6 10*3/uL (ref 1.7–7.7)
Neutrophils Relative %: 58 %
Platelets: 258 10*3/uL (ref 150–400)
RBC: 4.06 MIL/uL (ref 3.87–5.11)
RDW: 13.2 % (ref 11.5–15.5)
WBC: 6 10*3/uL (ref 4.0–10.5)
nRBC: 0 % (ref 0.0–0.2)

## 2023-11-20 LAB — LIPASE, BLOOD: Lipase: 57 U/L — ABNORMAL HIGH (ref 11–51)

## 2023-11-20 LAB — PROTIME-INR
INR: 3.3 — ABNORMAL HIGH (ref 0.8–1.2)
Prothrombin Time: 33.6 s — ABNORMAL HIGH (ref 11.4–15.2)

## 2023-11-20 LAB — TROPONIN I (HIGH SENSITIVITY)
Troponin I (High Sensitivity): 5 ng/L (ref <18)
Troponin I (High Sensitivity): 7 ng/L (ref <18)

## 2023-11-20 MED ORDER — LACTATED RINGERS IV BOLUS
1000.0000 mL | Freq: Once | INTRAVENOUS | Status: AC
  Administered 2023-11-20: 1000 mL via INTRAVENOUS

## 2023-11-20 MED ORDER — ONDANSETRON HCL 4 MG/2ML IJ SOLN
4.0000 mg | Freq: Once | INTRAMUSCULAR | Status: AC
  Administered 2023-11-20: 4 mg via INTRAVENOUS
  Filled 2023-11-20: qty 2

## 2023-11-20 MED ORDER — IOHEXOL 350 MG/ML SOLN
75.0000 mL | Freq: Once | INTRAVENOUS | Status: AC | PRN
  Administered 2023-11-20: 75 mL via INTRAVENOUS

## 2023-11-20 NOTE — ED Notes (Signed)
 Patient ambulated to restroom at this time. Reports mild dizziness.

## 2023-11-20 NOTE — ED Provider Notes (Signed)
 [place] EMERGENCY DEPARTMENT AT [facility] Provider Note   CSN: [id number] Arrival date & time: 11/20/23  0343     History Chief Complaint  Patient presents with   Near Syncope     Near Syncope Pertinent negatives include no headaches.   Katrina Obrien is a 68 y.o. female presenting for multiple near syncopal episodes that began this evening at around 4 PM while Katrina Obrien was at her table working on her computer.  Katrina Obrien states that at that time Katrina Obrien felt as if Katrina Obrien was lightheaded and that as her visual fields were narrowing.  Katrina Obrien denies having any pain at that time and still at presentation but has no pain.  Katrina Obrien called 911 when Katrina Obrien felt that Katrina Obrien was passing out while laying in bed.  At no point has Katrina Obrien lost consciousness, Katrina Obrien has not had any falls related to this, but Katrina Obrien feels that this has been persistent since onset this evening.  Katrina Obrien also describes extreme light sensitivity in both eyes.  Katrina Obrien does have a history of migraines though usually make her migraines present with scintillating scotoma.    Katrina Obrien is a 68 year old female with a previous medical history of aortic valve stenosis which was repaired with the mechanical valve.  Katrina Obrien also has a previous medical history of coronary artery disease, type 2 diabetes mellitus, hyperlipidemia, hypertension, and pulmonary embolus.  Currently taking aspirin, atorvastatin , empagliflozin , Repatha , Lasix , levothyroxine , metoprolol  succinate, pregabalin , Entresto , and Ozempic .  Katrina Obrien is on long-term Coumadin  therapy related to her mechanical aortic valve.   Patient's recorded medical, surgical, social, medication list and allergies were reviewed in the Snapshot window as part of the initial history.   Review of Systems   Review of Systems  Eyes:  Positive for photophobia. Negative for pain and visual disturbance.  Cardiovascular:  Positive for near-syncope.  Neurological:  Positive for dizziness and weakness. Negative for seizures, facial  asymmetry, speech difficulty, numbness and headaches.    Physical Exam Updated Vital Signs BP (!) 181/75   Pulse 67   Resp (!) 26   Ht 5\' 3"  (1.6 m)   Wt 76 kg   LMP  (LMP Unknown)   SpO2 100%   BMI 29.68 kg/m  Physical Exam Vitals and nursing note reviewed.  Constitutional:      General: Katrina Obrien is not in acute distress. HENT:     Head: Normocephalic and atraumatic.     Mouth/Throat:     Mouth: Mucous membranes are moist.     Pharynx: Oropharynx is clear.  Eyes:     Extraocular Movements: Extraocular movements intact.     Conjunctiva/sclera: Conjunctivae normal.     Pupils: Pupils are equal, round, and reactive to light.  Cardiovascular:     Rate and Rhythm: Normal rate and regular rhythm.     Pulses: Normal pulses.          Radial pulses are 2+ on the right side and 2+ on the left side.       Dorsalis pedis pulses are 2+ on the right side and 2+ on the left side.     Heart sounds: No murmur heard.    No friction rub. No gallop.     Comments: Noted mechanical aortic valve Pulmonary:     Effort: Pulmonary effort is normal. No respiratory distress.     Breath sounds: No wheezing, rhonchi or rales.  Abdominal:     General: Abdomen is flat. Bowel sounds are normal. There is no distension.  Palpations: Abdomen is soft.     Tenderness: There is no abdominal tenderness.  Musculoskeletal:        General: Normal range of motion.     Cervical back: Normal range of motion and neck supple. No rigidity.     Right lower leg: No edema.     Left lower leg: No edema.  Lymphadenopathy:     Cervical: No cervical adenopathy.  Skin:    General: Skin is warm and dry.     Capillary Refill: Capillary refill takes less than 2 seconds.  Neurological:     General: No focal deficit present.     Mental Status: Katrina Obrien is alert and oriented to person, place, and time. Mental status is at baseline.     GCS: GCS eye subscore is 4. GCS verbal subscore is 5. GCS motor subscore is 6.     Cranial  Nerves: Cranial nerves 2-12 are intact. No cranial nerve deficit or dysarthria.     Sensory: Sensation is intact.     Motor: Motor function is intact.  Psychiatric:        Mood and Affect: Mood normal.      ED Course/ Medical Decision Making/ A&P Clinical Course as of 11/20/23 0709  Fri Nov 20, 2023  0621 Patient appears in subjectively states that Katrina Obrien is greatly improved from initial presentation.  Her photophobia is resolved, Katrina Obrien still has continued mild nausea, and states that when Katrina Obrien is sitting upright Katrina Obrien still feels slightly unsteady but this is continuing to improve with time. [JG]    Clinical Course User Index [JG] Juanetta Nordmann, PA    Procedures Procedures   Medications Ordered in ED Medications - No data to display  Medical Decision Making:   Katrina Obrien is a 68 y.o. female who presented to the ED today with near syncope detailed above.    Handoff received from EMS.  Patient placed on continuous vitals and telemetry monitoring while in ED which was reviewed periodically.  Complete initial physical exam performed, notably the patient  was exhibiting photophobia, and was nauseated.    Reviewed and confirmed nursing documentation for past medical history, family history, social history.    Initial Assessment:   With the patient's presentation of near syncope, most likely diagnosis is vasovagal syncope. Other diagnoses were considered including (but not limited to) migraines, CVA. These are considered less likely due to history of present illness and physical exam findings.   This is most consistent with an acute complicated illness  Initial Plan:  Provide IV fluid bolus Screening labs including CBC and Metabolic panel to evaluate for infectious or metabolic etiology of disease.  As well as INR to monitor anticoagulation. EKG/Troponin testing/BNP testing to evaluate for cardiac pathology. Objective evaluation as below reviewed   Initial Study Results:    Laboratory  All laboratory results reviewed without evidence of clinically relevant pathology.   Exceptions include: Glucose is 223  EKG EKG was reviewed independently. Rate, rhythm, axis, intervals all examined and without medically relevant abnormality. ST segments without concerns for elevations.     Reassessment and Plan:   Continue to monitor for improvement.  If Katrina Obrien remains stable and improves plan to discharge with outpatient follow-up.   Singed out to Dr. Liam Redhead  Clinical Impression: No diagnosis found.   Data Unavailable   Final Clinical Impression(s) / ED Diagnoses Final diagnoses:  None    Rx / DC Orders ED Discharge Orders     None  Juanetta Nordmann, PA 11/20/23 0710    Lindle Rhea, MD 11/20/23 [phone]

## 2023-11-20 NOTE — ED Triage Notes (Signed)
 Pt BIB [facility] from home. Last night at 2100 pt reported dizzy and nauseous. Went to bed, woke up at 0300 sudden onset of dizzy, light headed, light sensitivity, diarrhea, and vomiting.  Pt was initially hypertensive 200/110. In route BP 150/90

## 2023-11-20 NOTE — ED Notes (Signed)
 Patient given Sprite and graham crackers at this time per primary RN's request

## 2023-11-20 NOTE — Discharge Instructions (Signed)
 As discussed, your evaluation today has been largely reassuring.  But, it is important that you monitor your condition carefully, and do not hesitate to return to the ED if you develop new, or concerning changes in your condition. ? ?Otherwise, please follow-up with your physician for appropriate ongoing care. ? ?

## 2023-11-20 NOTE — ED Provider Notes (Signed)
 Care of the patient assumed at signout.  On my initial exam the patient is awake and alert.  No additional emesis, initial labs notable for elevated lipase level, CT scan ordered.  12:13 PM Patient accompanied by her daughter.  We discussed all findings including CT results, CT without acute abnormalities.  Labs reviewed, patient is therapeutic with her Coumadin  dosing, seemingly.  We reviewed the presentation, concerning for near-syncope/weakness nausea vomiting.  Remaining considerations include TIA, nonsustained arrhythmia, viral process, atypical migraine.  Patient, daughter, I discussed admission versus close outpatient follow-up.  Patient has had no decompensation since arrival, is now awake, alert, sitting upright, states that she is comfortable with close outpatient follow-up, and this was facilitated.   Dorenda Gandy, MD 11/20/23 1214

## 2023-11-22 ENCOUNTER — Telehealth: Admitting: Physician Assistant

## 2023-11-22 DIAGNOSIS — R42 Dizziness and giddiness: Secondary | ICD-10-CM

## 2023-11-22 DIAGNOSIS — R110 Nausea: Secondary | ICD-10-CM

## 2023-11-22 MED ORDER — MECLIZINE HCL 25 MG PO TABS: 25.0000 mg | ORAL_TABLET | Freq: Three times a day (TID) | ORAL | 0 refills | Status: DC | PRN

## 2023-11-22 MED ORDER — ONDANSETRON 4 MG PO TBDP: 4.0000 mg | ORAL_TABLET | Freq: Three times a day (TID) | ORAL | 0 refills | Status: DC | PRN

## 2023-11-22 NOTE — Patient Instructions (Addendum)
 Katrina Obrien, thank you for joining Malcom Scriver, PA-C for today's virtual visit.  While this provider is not your primary care provider (PCP), if your PCP is located in our provider database this encounter information will be shared with them immediately following your visit.   A [facility] MyChart account gives you access to today's visit and all your visits, tests, and labs performed at [facility] " click here if you don't have a [facility] MyChart account or go to [email or web address]  Consent: (Patient) Katrina Obrien provided verbal consent for this virtual visit at the beginning of the encounter.  Current Medications:  Current Outpatient Medications:    meclizine (ANTIVERT) 25 MG tablet, Take 1 tablet (25 mg total) by mouth 3 (three) times daily as needed for dizziness., Disp: 30 tablet, Rfl: 0   ondansetron  (ZOFRAN -ODT) 4 MG disintegrating tablet, Take 1 tablet (4 mg total) by mouth every 8 (eight) hours as needed for nausea or vomiting., Disp: 20 tablet, Rfl: 0   aspirin EC 81 MG tablet, Take 81 mg by mouth daily., Disp: , Rfl:    atorvastatin  (LIPITOR) 40 MG tablet, TAKE 1 TABLET (40 MG TOTAL) BY MOUTH DAILY., Disp: 90 tablet, Rfl: 3   empagliflozin  (JARDIANCE ) 25 MG TABS tablet, Take 1 tablet (25 mg total) by mouth daily., Disp: 90 tablet, Rfl: 0   Evolocumab  (REPATHA  SURECLICK) 140 MG/ML SOAJ, INJECT 140 MG INTO THE SKIN EVERY 14 DAYS., Disp: 6 mL, Rfl: 3   fluticasone  (FLONASE ) 50 MCG/ACT nasal spray, Place 2 sprays into both nostrils daily. (Patient taking differently: Place 2 sprays into both nostrils daily as needed for allergies.), Disp: 16 g, Rfl: 0   furosemide  (LASIX ) 40 MG tablet, Take 0.5 tablets (20 mg total) by mouth daily., Disp: 45 tablet, Rfl: 2   levothyroxine  (SYNTHROID ) 50 MCG tablet, TAKE 1 TABLET (50 MCG TOTAL) BY MOUTH DAILY BEFORE BREAKFAST., Disp: 90 tablet, Rfl: 1   metoprolol  succinate (TOPROL -XL) 100 MG 24 hr tablet, TAKE 1 TABLET BY  MOUTH DAILY. TAKE WITH OR IMMEDIATELY FOLLOWING A MEAL., Disp: 90 tablet, Rfl: 3   pregabalin  (LYRICA ) 75 MG capsule, TAKE 1 CAPSULE (75 MG TOTAL) BY MOUTH DAILY. TAKE 1 TO 3 HOURS BEFORE BEDTIME. CAN INCREASE TO 2 CAPSULES DAILY., Disp: 90 capsule, Rfl: 2   sacubitril -valsartan  (ENTRESTO ) 24-26 MG, TAKE 1 TABLET BY MOUTH TWICE A DAY, Disp: 180 tablet, Rfl: 3   Semaglutide , 2 MG/DOSE, (OZEMPIC , 2 MG/DOSE,) 8 MG/3ML SOPN, Inject 2 mg into the skin once a week., Disp: 9 mL, Rfl: 1   VITAMIN D  PO, Take 1,000 Units by mouth daily., Disp: , Rfl:    warfarin (COUMADIN ) 2 MG tablet, TAKE 1 TO 1 AND 1/2 TABLETS BY MOUTH DAILY AS DIRECTED BY COUMADIN  CLINIC. (Patient taking differently: Take 2-3 mg by mouth See admin instructions. Take 1.5 tablets by mouth every Monday and 1 tablet all other days), Disp: 120 tablet, Rfl: 1   Medications ordered in this encounter:  Meds ordered this encounter  Medications   ondansetron  (ZOFRAN -ODT) 4 MG disintegrating tablet    Sig: Take 1 tablet (4 mg total) by mouth every 8 (eight) hours as needed for nausea or vomiting.    Dispense:  20 tablet    Refill:  0    Supervising Provider:   LAMPTEY, PHILIP O [[id number]]   meclizine (ANTIVERT) 25 MG tablet    Sig: Take 1 tablet (25 mg total) by mouth 3 (three) times daily as needed for  dizziness.    Dispense:  30 tablet    Refill:  0    Supervising Provider:   LAMPTEY, PHILIP O [[id number]]     *If you need refills on other medications prior to your next appointment, please contact your pharmacy*  Follow-Up: Call back or seek an in-person evaluation if the symptoms worsen or if the condition fails to improve as anticipated.  [facility] [phone]  Other Instructions [facility] [email or web address]    Dizziness Dizziness is a common problem. It makes you feel unsteady or light-headed. You may feel like you're about to faint. Dizziness can  lead to getting hurt if you stumble or fall. It's more common to feel dizzy if you're an older adult. Many things can cause you to feel dizzy. These include: Medicines. Dehydration. This is when there's not enough water in your body. Illness. Follow these instructions at home: Eating and drinking  Drink enough fluid to keep your pee (urine) pale yellow. This helps keep you from getting dehydrated. Try to drink more clear fluids, such as water. Do not drink alcohol. Try to limit how much caffeine you take in. Try to limit how much salt, also called sodium, you take in. Activity Try not to make quick movements. Stand up slowly from sitting in a chair. Steady yourself until you feel okay. In the morning, first sit up on the side of the bed. When you feel okay, hold onto something and slowly stand up. Do this until you know that your balance is okay. If you need to stand in one place for a long time, move your legs often. Tighten and relax the muscles in your legs while you're standing. Do not drive or use machines if you feel dizzy. Avoid bending down if you feel dizzy. Place items in your home so you can reach them without leaning over. Lifestyle Do not smoke, vape, or use products with nicotine or tobacco in them. If you need help quitting, talk with your health care provider. Try to lower your stress level. You can do this by using methods like yoga or meditation. Talk with your provider if you need help. General instructions Watch your dizziness for any changes. Take your medicines only as told by your provider. Talk with your provider if you think you're dizzy because of a medicine you're taking. Tell a friend or a family member that you're feeling dizzy. If they spot any changes in your behavior, have them call your provider. Contact a health care provider if: Your dizziness doesn't go away, or you have new symptoms. Your dizziness gets worse. You feel like you may vomit. You have  trouble hearing. You have a fever. You have neck pain or a stiff neck. You fall or get hurt. Get help right away if: You vomit each time you eat or drink. You have watery poop and can't eat or drink. You have trouble talking, walking, swallowing, or using your arms, hands, or legs. You feel very weak. You're bleeding. You're not thinking clearly, or you have trouble forming sentences. A friend or family member may spot this. Your vision changes, or you get a very bad headache. These symptoms may be an emergency. Call 911 right away. Do not wait to see if the symptoms will go away. Do not drive yourself to the hospital. This information is not intended to replace advice given to you by your health care provider. Make sure you discuss any questions you have with  your health care provider. Document Revised: 04/16/2023 Document Reviewed: 08/28/2022 Elsevier Patient Education  2024 [employer]vier Inc.   If you have been instructed to have an in-person evaluation today at a local Urgent Care facility, please use the link below. It will take you to a list of all of our available [facility], including address, phone number and hours of operation. Please do not delay care.  [facility]  If you or a family member do not have a primary care provider, use the link below to schedule a visit and establish care. When you choose a [facility] primary care physician or advanced practice provider, you gain a long-term partner in health. Find a Primary Care Provider  Learn more about [facility] in-office and virtual care options: [facility] - Get Care Now

## 2023-11-22 NOTE — Progress Notes (Signed)
 Virtual Visit Consent   Katrina Obrien, you are scheduled for a virtual visit with a [facility] provider today. Just as with appointments in the office, your consent must be obtained to participate. Your consent will be active for this visit and any virtual visit you may have with one of our providers in the next 365 days. If you have a MyChart account, a copy of this consent can be sent to you electronically.  As this is a virtual visit, video technology does not allow for your provider to perform a traditional examination. This may limit your provider's ability to fully assess your condition. If your provider identifies any concerns that need to be evaluated in person or the need to arrange testing (such as labs, EKG, etc.), we will make arrangements to do so. Although advances in technology are sophisticated, we cannot ensure that it will always work on either your end or our end. If the connection with a video visit is poor, the visit may have to be switched to a telephone visit. With either a video or telephone visit, we are not always able to ensure that we have a secure connection.  By engaging in this virtual visit, you consent to the provision of healthcare and authorize for your insurance to be billed (if applicable) for the services provided during this visit. Depending on your insurance coverage, you may receive a charge related to this service.  I need to obtain your verbal consent now. Are you willing to proceed with your visit today? Katrina Obrien has provided verbal consent on 11/22/2023 for a virtual visit (video or telephone). Katrina Scriver, PA-C  Date: 11/22/2023 2:11 PM   Virtual Visit via Video Note   I, Katrina Obrien, connected with  Katrina Obrien  ([id number], [birth year]) on 11/22/23 at  1:15 PM EDT by a video-enabled telemedicine application and verified that I am speaking with the correct person using two identifiers.  Location: Patient: Virtual Visit Location Patient:  Home Provider: Virtual Visit Location Provider: Home Office   I discussed the limitations of evaluation and management by telemedicine and the availability of in person appointments. The patient expressed understanding and agreed to proceed.    History of Present Illness: Katrina Obrien is a 68 y.o. who identifies as a female who was assigned female at birth,  with a history of open heart surgery and thyroid  disorder, presents with dizziness, vomiting, nausea, and headache. The symptoms began on Thursday night when she woke up feeling like she was passing out. She experienced darkness and was unable to sit up without feeling like she was going to pass out. She also felt nauseous and eventually vomited. She was unable to get up and had to crawl to the bathroom. She called 911 and was taken to the hospital where she continued to vomit and dry heave. She was given an IV and tested for stroke and heart issues. She was also given medication for nausea. She was eventually able to get up and walk, albeit wobbly, and the nausea began to improve. She was given two more shots of nausea medication. She felt better upon leaving the hospital but has since experienced more dizziness and attempted to vomit again. She is unable to eat anything and feels things spinning when she sits up. She does not feel like she is passing out but feels sick to her stomach when things start spinning. She has been taking her regular medications and had a blood glucose  check right before this visit and it was 169.  Had eaten crackers 15 minutes prior.  She has also been experiencing headaches, and has been taking Tylenol  to manage.  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   Aortic stenosis 01/26/2013   S/P AVR 01/26/2013   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   Hypertension associated with diabetes (HCC) 01/26/2013   Familial hyperlipidemia 01/26/2013   Cardiomyopathy, ischemic 01/26/2013   DVT (deep venous thrombosis) (HCC) 01/26/2013   Pulmonary embolus (HCC) 01/26/2013   Uncontrolled type 2 diabetes mellitus with hyperglycemia (HCC) 01/26/2013   Long term (current) use of anticoagulants 10/11/2012    Allergies:  Allergies  Allergen Reactions   Niaspan [Niacin Er (Antihyperlipidemic)] Other (See Comments)    Flushing, even with ASA   Rosuvastatin  Other (See Comments)    Fatigue, weakness   Medications:  Current Outpatient Medications:    meclizine (ANTIVERT) 25 MG tablet, Take 1 tablet (25 mg total) by mouth 3 (three) times daily as needed for dizziness., Disp: 30 tablet, Rfl: 0   ondansetron  (ZOFRAN -ODT) 4 MG disintegrating tablet, Take 1 tablet (4 mg total) by mouth every 8 (eight) hours as needed for nausea or vomiting., Disp: 20 tablet, Rfl: 0   aspirin EC 81 MG tablet, Take 81 mg by mouth daily., Disp: , Rfl:    atorvastatin  (LIPITOR) 40 MG tablet, TAKE 1 TABLET (40 MG TOTAL) BY MOUTH DAILY., Disp: 90 tablet, Rfl: 3   empagliflozin  (JARDIANCE ) 25 MG TABS tablet, Take 1 tablet (25 mg total) by mouth daily., Disp: 90 tablet, Rfl: 0   Evolocumab  (REPATHA  SURECLICK) 140 MG/ML SOAJ, INJECT 140 MG INTO THE SKIN EVERY 14 DAYS., Disp: 6 mL, Rfl: 3   fluticasone  (FLONASE ) 50 MCG/ACT nasal spray, Place 2 sprays into both nostrils daily. (Patient taking differently: Place 2 sprays into both nostrils daily as needed for  allergies.), Disp: 16 g, Rfl: 0   furosemide  (LASIX ) 40 MG tablet, Take 0.5 tablets (20 mg total) by mouth daily., Disp: 45 tablet, Rfl: 2   levothyroxine  (SYNTHROID ) 50 MCG tablet, TAKE 1 TABLET (50 MCG TOTAL) BY MOUTH DAILY BEFORE BREAKFAST., Disp: 90 tablet, Rfl: 1   metoprolol  succinate (TOPROL -XL) 100 MG 24 hr tablet, TAKE 1 TABLET BY MOUTH DAILY. TAKE WITH OR IMMEDIATELY FOLLOWING A MEAL., Disp: 90 tablet, Rfl: 3   pregabalin  (LYRICA ) 75 MG capsule, TAKE 1 CAPSULE (75 MG TOTAL) BY MOUTH DAILY. TAKE 1 TO 3 HOURS BEFORE BEDTIME. CAN INCREASE TO 2 CAPSULES DAILY., Disp: 90 capsule, Rfl: 2   sacubitril -valsartan  (ENTRESTO ) 24-26 MG, TAKE 1 TABLET BY MOUTH TWICE A DAY, Disp: 180 tablet, Rfl: 3   Semaglutide , 2 MG/DOSE, (OZEMPIC , 2 MG/DOSE,) 8 MG/3ML SOPN, Inject 2 mg into the skin once a week., Disp: 9 mL, Rfl: 1   VITAMIN D  PO, Take 1,000 Units by mouth daily., Disp: , Rfl:    warfarin (COUMADIN ) 2 MG tablet,  TAKE 1 TO 1 AND 1/2 TABLETS BY MOUTH DAILY AS DIRECTED BY COUMADIN  CLINIC. (Patient taking differently: Take 2-3 mg by mouth See admin instructions. Take 1.5 tablets by mouth every Monday and 1 tablet all other days), Disp: 120 tablet, Rfl: 1  Observations/Objective: Patient is well-developed, well-nourished in no acute distress.  Resting comfortably  at home. Daughter is present.  Head is normocephalic, atraumatic.  No labored breathing.  Speech is clear and coherent with logical content.  Patient is alert and oriented at baseline.    Assessment and Plan: 1. Dizziness (Primary) - meclizine (ANTIVERT) 25 MG tablet; Take 1 tablet (25 mg total) by mouth 3 (three) times daily as needed for dizziness.  Dispense: 30 tablet; Refill: 0  2. Nausea - ondansetron  (ZOFRAN -ODT) 4 MG disintegrating tablet; Take 1 tablet (4 mg total) by mouth every 8 (eight) hours as needed for nausea or vomiting.  Dispense: 20 tablet; Refill: 0   Acute dizziness and nausea with vomiting and near-syncope.  Differential includes viral infection, vertigo, thyroid  dysfunction, or ear pathology. Symptoms improved with antiemetics but recurred. - Prescribe antiemetics and antivertigo medication. - Encourage hydration. - Recommend in-person evaluation, including ear examination and thyroid  function tests. - Suggest visiting , PCP, [facility] or urgent care if symptoms persist.  Hypertension Recent elevated blood pressure readings, possibly related to dizziness and nausea. - Monitor blood pressure at home.     Follow Up Instructions: I discussed the assessment and treatment plan with the patient. The patient was provided an opportunity to ask questions and all were answered. The patient agreed with the plan and demonstrated an understanding of the instructions.  A copy of instructions were sent to the patient via MyChart unless otherwise noted below.     The patient was advised to call back or seek an in-person evaluation if the symptoms worsen or if the condition fails to improve as anticipated.    Etter Hermann Mayers, PA-C

## 2023-11-23 ENCOUNTER — Ambulatory Visit: Payer: Self-pay

## 2023-11-23 NOTE — Telephone Encounter (Signed)
 Copied From CRM [id number]. Reason for Triage: Headache, dizzy, nausea.. number on file is good

## 2023-11-23 NOTE — Telephone Encounter (Signed)
  Chief Complaint: dizziness Symptoms: dizziness, nausea/vomiting Frequency: 4 days Pertinent Negatives: Patient denies fever, CP, difficulty breathing,  Disposition: [] ED /[] Urgent Care (no appt availability in office) / [x] Appointment(In office/virtual)/ []  [facility]/ [] Home Care/ [x] Refused Recommended Disposition /[] [facility]/ []  Follow-up with PCP Additional Notes: Pt states that she felt like she was going to pass out last week, took EMS to the ED. Pt states they tested her for cardiac, stroke. Pt states that she was sent home. Pt states that the dizziness and nausea are not better. Pt states that she cannot sit up d/t dizziness. Pt states that her dizziness is not any better nor worse than when she was  in the ED. Pt is already sched for 5/1 appt, but states that she was hoping to be seen sooner. No appts today in clinic. Pt states that that she cannot deal with the dizziness and being bed bound. Pt will be utilizing UC.   Reason for Disposition  [1] MODERATE dizziness (e.g., interferes with normal activities) AND [2] has been evaluated by doctor (or NP/PA) for this  Answer Assessment - Initial Assessment Questions 1. DESCRIPTION: "Describe your dizziness."     Pt states she feels as though she is spinning, pt states she feels dizzy 2. LIGHTHEADED: "Do you feel lightheaded?" (e.g., somewhat faint, woozy, weak upon standing)     Nausea, near syncope 3. VERTIGO: "Do you feel like either you or the room is spinning or tilting?" (i.e. vertigo)     I'm dizzy, not necessarily the room spinning 4. SEVERITY: "How bad is it?"  "Do you feel like you are going to faint?" "Can you stand and walk?"   - MILD: Feels slightly dizzy, but walking normally.   - MODERATE: Feels unsteady when walking, but not falling; interferes with normal activities (e.g., school, work).   - SEVERE: Unable to walk without falling, or requires assistance to walk without falling; feels like passing  out now.      Moderate to severe at times  5. ONSET:  "When did the dizziness begin?"     4 days 6. AGGRAVATING FACTORS: "Does anything make it worse?" (e.g., standing, change in head position)     Supine positioning 7. HEART RATE: "Can you tell me your heart rate?" "How many beats in 15 seconds?"  (Note: not all patients can do this)       72 8. CAUSE: "What do you think is causing the dizziness?"     Unsure, pt states that the ED r/o stroke and cardiac emergency 9. RECURRENT SYMPTOM: "Have you had dizziness before?" If Yes, ask: "When was the last time?" "What happened that time?"     Never this way 10. OTHER SYMPTOMS: "Do you have any other symptoms?" (e.g., fever, chest pain, vomiting, diarrhea, bleeding)       N/V, denies CP and difficulty breathing  Protocols used: Dizziness - Lightheadedness-A-AH

## 2023-11-24 ENCOUNTER — Emergency Department (HOSPITAL_COMMUNITY)
Admission: EM | Admit: 2023-11-24 | Discharge: 2023-11-24 | Disposition: A | Discharge status: Home / Self Care | Attending: Emergency Medicine | Admitting: Emergency Medicine

## 2023-11-24 ENCOUNTER — Emergency Department (HOSPITAL_COMMUNITY)

## 2023-11-24 ENCOUNTER — Encounter (HOSPITAL_COMMUNITY): Payer: Self-pay

## 2023-11-24 ENCOUNTER — Other Ambulatory Visit: Payer: Self-pay

## 2023-11-24 ENCOUNTER — Ambulatory Visit
Admission: RE | Admit: 2023-11-24 | Discharge: 2023-11-24 | Disposition: A | Discharge status: Other Acute Inpatient Hospital | Source: Ambulatory Visit | Attending: Family Medicine | Admitting: Family Medicine

## 2023-11-24 VITALS — BP 152/78 | HR 78 | Temp 98.1°F | Resp 18

## 2023-11-24 DIAGNOSIS — Z7982 Long term (current) use of aspirin: Secondary | ICD-10-CM | POA: Insufficient documentation

## 2023-11-24 DIAGNOSIS — I10 Essential (primary) hypertension: Secondary | ICD-10-CM | POA: Insufficient documentation

## 2023-11-24 DIAGNOSIS — Z7901 Long term (current) use of anticoagulants: Secondary | ICD-10-CM | POA: Insufficient documentation

## 2023-11-24 DIAGNOSIS — E119 Type 2 diabetes mellitus without complications: Secondary | ICD-10-CM | POA: Insufficient documentation

## 2023-11-24 DIAGNOSIS — H81392 Other peripheral vertigo, left ear: Secondary | ICD-10-CM | POA: Insufficient documentation

## 2023-11-24 DIAGNOSIS — I2510 Atherosclerotic heart disease of native coronary artery without angina pectoris: Secondary | ICD-10-CM | POA: Insufficient documentation

## 2023-11-24 DIAGNOSIS — R42 Dizziness and giddiness: Secondary | ICD-10-CM

## 2023-11-24 DIAGNOSIS — Z79899 Other long term (current) drug therapy: Secondary | ICD-10-CM | POA: Insufficient documentation

## 2023-11-24 DIAGNOSIS — E039 Hypothyroidism, unspecified: Secondary | ICD-10-CM | POA: Insufficient documentation

## 2023-11-24 LAB — POCT URINALYSIS DIP (MANUAL ENTRY)
Bilirubin, UA: NEGATIVE
Blood, UA: NEGATIVE
Glucose, UA: NEGATIVE mg/dL
Ketones, POC UA: NEGATIVE mg/dL
Leukocytes, UA: NEGATIVE
Nitrite, UA: NEGATIVE
Protein Ur, POC: NEGATIVE mg/dL
Spec Grav, UA: >=1.03 — AB
Urobilinogen, UA: 0.2 U/dL
pH, UA: 6

## 2023-11-24 LAB — DIFFERENTIAL
Abs Immature Granulocytes: 0.06 10*3/uL (ref 0.00–0.07)
Basophils Absolute: 0 10*3/uL (ref 0.0–0.1)
Basophils Relative: 0 %
Eosinophils Absolute: 0 10*3/uL (ref 0.0–0.5)
Eosinophils Relative: 0 %
Immature Granulocytes: 1 %
Lymphocytes Relative: 23 %
Lymphs Abs: 2.3 10*3/uL (ref 0.7–4.0)
Monocytes Absolute: 0.5 10*3/uL (ref 0.1–1.0)
Monocytes Relative: 5 %
Neutro Abs: 7.1 10*3/uL (ref 1.7–7.7)
Neutrophils Relative %: 71 %

## 2023-11-24 LAB — I-STAT CHEM 8, ED
BUN: 25 mg/dL — ABNORMAL HIGH (ref 8–23)
Calcium, Ion: 1.12 mmol/L — ABNORMAL LOW (ref 1.15–1.40)
Chloride: 107 mmol/L (ref 98–111)
Creatinine, Ser: 0.7 mg/dL (ref 0.44–1.00)
Glucose, Bld: 160 mg/dL — ABNORMAL HIGH (ref 70–99)
HCT: 46 % (ref 36.0–46.0)
Hemoglobin: 15.6 g/dL — ABNORMAL HIGH (ref 12.0–15.0)
Potassium: 4.1 mmol/L (ref 3.5–5.1)
Sodium: 139 mmol/L (ref 135–145)
TCO2: 18 mmol/L — ABNORMAL LOW (ref 22–32)

## 2023-11-24 LAB — CBG MONITORING, ED: Glucose-Capillary: 153 mg/dL — ABNORMAL HIGH (ref 70–99)

## 2023-11-24 LAB — CBC
HCT: 45.6 % (ref 36.0–46.0)
Hemoglobin: 14.8 g/dL (ref 12.0–15.0)
MCH: 31.4 pg (ref 26.0–34.0)
MCHC: 32.5 g/dL (ref 30.0–36.0)
MCV: 96.6 fL (ref 80.0–100.0)
Platelets: 303 10*3/uL (ref 150–400)
RBC: 4.72 MIL/uL (ref 3.87–5.11)
RDW: 13.1 % (ref 11.5–15.5)
WBC: 10 10*3/uL (ref 4.0–10.5)
nRBC: 0 % (ref 0.0–0.2)

## 2023-11-24 LAB — COMPREHENSIVE METABOLIC PANEL WITH GFR
ALT: 30 U/L (ref 0–44)
AST: 21 U/L (ref 15–41)
Albumin: 4.3 g/dL (ref 3.5–5.0)
Alkaline Phosphatase: 43 U/L (ref 38–126)
Anion gap: 15 (ref 5–15)
BUN: 22 mg/dL (ref 8–23)
CO2: 18 mmol/L — ABNORMAL LOW (ref 22–32)
Calcium: 9.4 mg/dL (ref 8.9–10.3)
Chloride: 105 mmol/L (ref 98–111)
Creatinine, Ser: 0.9 mg/dL (ref 0.44–1.00)
GFR, Estimated: >60 mL/min (ref >60)
Glucose, Bld: 159 mg/dL — ABNORMAL HIGH (ref 70–99)
Potassium: 3.6 mmol/L (ref 3.5–5.1)
Sodium: 138 mmol/L (ref 135–145)
Total Bilirubin: 1.4 mg/dL — ABNORMAL HIGH (ref 0.0–1.2)
Total Protein: 7.3 g/dL (ref 6.5–8.1)

## 2023-11-24 LAB — APTT: aPTT: 42 s — ABNORMAL HIGH (ref 24–36)

## 2023-11-24 LAB — PROTIME-INR
INR: 3.1 — ABNORMAL HIGH (ref 0.8–1.2)
Prothrombin Time: 32.3 s — ABNORMAL HIGH (ref 11.4–15.2)

## 2023-11-24 LAB — ETHANOL: Alcohol, Ethyl (B): <15 mg/dL (ref <15)

## 2023-11-24 MED ORDER — MECLIZINE HCL 25 MG PO TABS: 25.0000 mg | ORAL_TABLET | Freq: Three times a day (TID) | ORAL | 0 refills | Status: DC | PRN

## 2023-11-24 MED ORDER — DIAZEPAM 2 MG PO TABS: 2.0000 mg | ORAL_TABLET | Freq: Four times a day (QID) | ORAL | 0 refills | Status: DC | PRN

## 2023-11-24 MED ORDER — SCOPOLAMINE 1 MG/3DAYS TD PT72: 1.0000 | MEDICATED_PATCH | TRANSDERMAL | 0 refills | Status: DC

## 2023-11-24 MED ORDER — DIAZEPAM 5 MG/ML IJ SOLN
5.0000 mg | Freq: Once | INTRAMUSCULAR | Status: AC
  Administered 2023-11-24: 5 mg via INTRAVENOUS
  Filled 2023-11-24: qty 2

## 2023-11-24 MED ORDER — MECLIZINE HCL 25 MG PO TABS
25.0000 mg | ORAL_TABLET | Freq: Once | ORAL | Status: AC
  Administered 2023-11-24: 25 mg via ORAL
  Filled 2023-11-24: qty 1

## 2023-11-24 MED ORDER — SODIUM CHLORIDE 0.9% FLUSH
3.0000 mL | Freq: Once | INTRAVENOUS | Status: AC
  Administered 2023-11-24: 3 mL via INTRAVENOUS

## 2023-11-24 NOTE — ED Triage Notes (Signed)
 Patient was here on Thursday for dizziness and nausea.  REports they did a full cardiac work up which was negative.  Reports they gave her some medication which helped and she was discharged home.  Reports symptoms got worse on Saturday and even worse last night with headache, dizziness and nausea.  Reports she feels like she is spinning.

## 2023-11-24 NOTE — ED Provider Notes (Addendum)
 [facility]    CSN: [id number] Arrival date & time: 11/24/23  0931      History   Chief Complaint Chief Complaint  Patient presents with   Nausea    Dizziness, light sensitivity, vomiting, headaches, every time I pick my head up. - Entered by patient   Dizziness    HPI Katrina Obrien is a 68 y.o. female.    Dizziness Patient with a history of a clotting disorder, type 2 diabetes, DVT, PE, ischemic cardiomyopathy presents today with a 4-day history of worsening nausea and dizziness with any positional changes.  Patient was seen in the emergency department 4 days ago for the same problem had a CT of the abdomen which was negative subsequently discharged and advised to follow-up with PCP.  Patient has practically been in the bed the entire last 4 days and with any movement continues to have nausea and vomiting.  She had a video visit and was prescribed meclizine and Zofran  and endorses that neither medication has made her symptoms improved.  Reports  nausea, vomiting, weakness and dizziness has persisted despite recent ED evaluation.  Patient did not have a head CT during her recent ED evaluation.  In review of providers note there was some consideration on having patient remain hospital and admit for observation however it was observed that patient seemed to be doing much better that she be discharged and follow-up with PCP.  Patient has a PCP appointment on Friday.  She endorses that she has had poor appetite.  She has been checking her blood sugar occasionally and it has been high greater than 150 less than 200.  Blood pressure has been pretty consistently in the 150s systolically and 80s diastolically.  Patient endorses light sensitivity and with any movement she is severely dizzy and nauseous. 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   Clotting disorder  (HCC)    anticoagulant - coumadin  hx DVT, PE   Coronary artery disease    s/p 3- vessel bypass    Diabetes mellitus without complication (HCC)    DVT (deep venous thrombosis) (HCC)    Heart murmur    Hyperlipidemia    Hypertension    Ischemic cardiomyopathy    40 to 45%   Left bundle branch block    Mechanical heart valve present    Jan 2012   Popliteal artery occlusion, left (HCC)    recent  status  post angioplasty   Pulmonary embolism (HCC)    associated with DVT   Pulmonary embolus (HCC)    Thyroid  disease    hyperthyroidism    Patient Active Problem List   Diagnosis Date Noted   Osteoporosis 09/21/2023   Head injury 09/14/2023   Rib pain 09/14/2023   Restless leg syndrome 02/16/2023   Plantar fasciitis of right foot 06/16/2022   Caregiver stress syndrome 03/08/2019   Vitamin D  insufficiency 09/13/2018   White coat syndrome with diagnosis of hypertension 09/13/2018   Hyperlipidemia associated with type 2 diabetes mellitus (HCC) 02/03/2018   Hypothyroidism 02/03/2018   LBBB (left bundle branch block) 04/06/2014   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   Aortic stenosis 01/26/2013   S/P AVR 01/26/2013   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   Hypertension associated with diabetes (HCC) 01/26/2013   Familial hyperlipidemia 01/26/2013   Cardiomyopathy, ischemic 01/26/2013   DVT (deep venous thrombosis) (HCC)  01/26/2013   Pulmonary embolus (HCC) 01/26/2013   Uncontrolled type 2 diabetes mellitus with hyperglycemia (HCC) 01/26/2013   Long term (current) use of anticoagulants 10/11/2012    Past Surgical History:  Procedure Laterality Date   AORTIC VALVE REPLACEMENT (AVR)/CORONARY ARTERY BYPASS GRAFTING (CABG)  08/16/2010  VAN TRIGT   AV replacement w/19-mm mechanical St Jude valve ([id number]),CABGX3  lima to LAD, SVG to distal circ,SVG to posterior descending   ASD REPAIR     CARDIAC CATHETERIZATION  08/08/2010   ,LAD 50% PROX,605 TANDEM SEGMENTAL STENOSIS,LEFT CIRC  60%-AV GROOVE, RGT COR 40% PROX ON THE BEND ,80% PROX PDA,   CORONARY ARTERY BYPASS GRAFT  08/16/2010   LIMA TO lad,svg to distal circ ,svg to posterior descending   DOPPLER ECHOCARDIOGRAPHY  08/09/2010   EF35% to 40% ,bbicuspid ;severely  thickened ,severely calcified leaflets, LV normal   LEA doppler  08/07/2010   left popliteal occlusion ,and anterior tibial complete occlusion   NM MYOCAR PERF WALL MOTION  08/06/2010   EF 31%  LOW RISK SCAN   nuc     PV ANGIOGRAM  08/08/2010   successful PTA LEFT POPLITEAL    OB History   No obstetric history on file.      Home Medications    Prior to Admission medications   Medication Sig Start Date End Date Taking? Authorizing Provider  aspirin EC 81 MG tablet Take 81 mg by mouth daily.    [provider]  atorvastatin  (LIPITOR) 40 MG tablet TAKE 1 TABLET (40 MG TOTAL) BY MOUTH DAILY. 07/20/23   Hilty, Aviva Lemmings, MD  empagliflozin  (JARDIANCE ) 25 MG TABS tablet Take 1 tablet (25 mg total) by mouth daily. 05/19/23   Noreene Bearded, PA  Evolocumab  (REPATHA  SURECLICK) 140 MG/ML SOAJ INJECT 140 MG INTO THE SKIN EVERY 14 DAYS. 11/03/23   Hilty, Aviva Lemmings, MD  fluticasone  (FLONASE ) 50 MCG/ACT nasal spray Place 2 sprays into both nostrils daily. Patient taking differently: Place 2 sprays into both nostrils daily as needed for allergies. 07/22/22   Lanetta Pion, NP  furosemide  (LASIX ) 40 MG tablet Take 0.5 tablets (20 mg total) by mouth daily. 09/21/23   Noreene Bearded, PA  levothyroxine  (SYNTHROID ) 50 MCG tablet TAKE 1 TABLET (50 MCG TOTAL) BY MOUTH DAILY BEFORE BREAKFAST. 09/04/23   Noreene Bearded, PA  meclizine (ANTIVERT) 25 MG tablet Take 1 tablet (25 mg total) by mouth 3 (three) times daily as needed for dizziness. 11/22/23   Mayers, Cari S, PA-C  metoprolol  succinate (TOPROL -XL) 100 MG 24 hr tablet TAKE 1 TABLET BY MOUTH DAILY. TAKE WITH OR IMMEDIATELY FOLLOWING A MEAL. 03/23/23   Hilty, Aviva Lemmings, MD  ondansetron  (ZOFRAN -ODT) 4 MG  disintegrating tablet Take 1 tablet (4 mg total) by mouth every 8 (eight) hours as needed for nausea or vomiting. 11/22/23   Mayers, Cari S, PA-C  pregabalin  (LYRICA ) 75 MG capsule TAKE 1 CAPSULE (75 MG TOTAL) BY MOUTH DAILY. TAKE 1 TO 3 HOURS BEFORE BEDTIME. CAN INCREASE TO 2 CAPSULES DAILY. 09/04/23   Noreene Bearded, PA  sacubitril -valsartan  (ENTRESTO ) 24-26 MG TAKE 1 TABLET BY MOUTH TWICE A DAY 04/13/23   Hilty, Aviva Lemmings, MD  Semaglutide , 2 MG/DOSE, (OZEMPIC , 2 MG/DOSE,) 8 MG/3ML SOPN Inject 2 mg into the skin once a week. 10/14/23   Laneta Pintos, MD  VITAMIN D  PO Take 1,000 Units by mouth daily.    [provider]  warfarin (COUMADIN ) 2 MG tablet TAKE 1 TO 1 AND  1/2 TABLETS BY MOUTH DAILY AS DIRECTED BY COUMADIN  CLINIC. Patient taking differently: Take 2-3 mg by mouth See admin instructions. Take 1.5 tablets by mouth every Monday and 1 tablet all other days 06/30/23   Hazle Lites, MD    Family History Family History  Problem Relation Age of Onset   Cancer Mother    Cancer Father    CAD Father    Heart attack Father 39       first MI, 2 major after that, multiple silent MIs   Stroke Brother    CAD Brother 7       first stent   Heart attack Brother    CAD Brother 1       CABG    Hypertension Brother    Heart attack Brother 45       died from MI   Colon cancer Neg Hx    Esophageal cancer Neg Hx    Rectal cancer Neg Hx    Stomach cancer Neg Hx     Social History Social History   Tobacco Use   Smoking status: Never    Passive exposure: Never   Smokeless tobacco: Never  Vaping Use   Vaping status: Never Used  Substance Use Topics   Alcohol use: Yes    Alcohol/week: 7.0 standard drinks of alcohol    Types: 7 Standard drinks or equivalent per week   Drug use: No     Allergies   Niaspan [niacin er (antihyperlipidemic)] and Rosuvastatin    Review of Systems Review of Systems  Neurological:  Positive for dizziness.     Physical Exam Triage Vital  Signs ED Triage Vitals  Encounter Vitals Group     BP      Systolic BP Percentile      Diastolic BP Percentile      Pulse      Resp      Temp      Temp src      SpO2      Weight      Height      Head Circumference      Peak Flow      Pain Score      Pain Loc      Pain Education      Exclude from Growth Chart    No data found.  Updated Vital Signs BP (!) 152/78 (BP Location: Left Arm)   Pulse 78   Temp 98.1 F (36.7 C) (Oral)   Resp 18   LMP  (LMP Unknown)   SpO2 98%   Visual Acuity Right Eye Distance:   Left Eye Distance:   Bilateral Distance:    Right Eye Near:   Left Eye Near:    Bilateral Near:     Physical Exam Constitutional:      Appearance: She is ill-appearing.  HENT:     Head: Normocephalic and atraumatic.  Eyes:     Extraocular Movements:     Right eye: Normal extraocular motion.     Left eye: Normal extraocular motion.  Cardiovascular:     Rate and Rhythm: Normal rate and regular rhythm.  Pulmonary:     Effort: Pulmonary effort is normal.  Skin:    General: Skin is warm.  Neurological:     Mental Status: She is alert.     Motor: Weakness present.      UC Treatments / Results  Labs (all labs ordered are listed, but only abnormal results are displayed) Labs Reviewed  POCT URINALYSIS DIP (MANUAL ENTRY) - Abnormal; Notable for the following components:      Result Value   Spec Grav, UA >=1.030 (*)    All other components within normal limits    EKG   Radiology No results found.  Procedures Procedures (including critical care time)  Medications Ordered in UC Medications - No data to display  Initial Impression / Assessment and Plan / UC Course  I have reviewed the triage vital signs and the nursing notes.  Pertinent labs & imaging results that were available during my care of the patient were reviewed by me and considered in my medical decision making (see chart for details).    Given severity of patient's symptoms and  responsive to oral Zofran  and meclizine to decrease dizziness and nausea patient is being deferred to the ED for further workup and evaluation of persistent new onset dizziness, light sensitivity, nausea.  Warrants further workup and evaluation with a head CT to rule out any neurovascular sources of current symptoms. Final Clinical Impressions(s) / UC Diagnoses   Final diagnoses:  Severe dizziness   Discharge Instructions   None    ED Prescriptions   None    PDMP not reviewed this encounter.   Buena Carmine, NP 11/24/23 1051    Buena Carmine, NP 11/24/23 1052

## 2023-11-24 NOTE — ED Notes (Signed)
 Patient is being discharged from the Urgent Care and sent to the Emergency Department via POV . Per [facility], patient is in need of higher level of care due to dizziness/nausea/HA. Patient is aware and verbalizes understanding of plan of care.  Vitals:   11/24/23 1010  BP: (!) 152/78  Pulse: 78  Resp: 18  Temp: 98.1 F (36.7 C)  SpO2: 98%

## 2023-11-24 NOTE — ED Provider Notes (Signed)
 [facility] Provider Note   CSN: [id number] Arrival date & time: 11/24/23  1107     History  Chief Complaint  Patient presents with   Dizziness    Katrina Obrien is a 68 y.o. female with history of diabetes, CAD s/p three-vessel bypass, aortic valve stenosis with mechanical valve replacement, hyperlipidemia, hypertension, PE/DVT on Coumadin , left bundle branch block, hypothyroidism, who presents the emergency department with dizziness.  Per patient report, symptoms started waking her up from sleep late Thursday night, 5 days ago.  She was doing some work when she suddenly felt very lightheaded like she might lose consciousness.  She laid on the couch and her symptoms resolved and she went to bed as normal.  She woke up around 2 AM and felt like she was going to fall out of the bed and vomit.  She tried to walk to the bathroom and had to lay down in the hallway.  She then called 911.  She was brought to the ER and had evaluation, including cardiac workup.  She had a CT scan of her abdomen that was normal as well.  She was sent home after her symptoms were improving with medications given.  The following day she felt fairly normal.  The day after she started experiencing worsening symptoms.  She had a televisit with urgent care and they prescribed her meclizine and Zofran .  She has been trying meclizine for the past 2 to 3 days, and having no relief.  She has had little food intake for the past few days due to severe nausea.  Seems as though her symptoms might be worse when she looks or turns her head to the left, but it is especially made worse with changing positions including sitting or standing.  Having somewhat of a headache and some light sensitivity as well. Thinks she had some left ear pain and has recently had a buzzing in her ears.    Dizziness Associated symptoms: nausea and vomiting        Home Medications Prior to Admission  medications   Medication Sig Start Date End Date Taking? Authorizing Provider  diazepam  (VALIUM ) 2 MG tablet Take 1 tablet (2 mg total) by mouth every 6 (six) hours as needed (dizziness). 11/24/23  Yes Kiptyn Rafuse T, PA-C  scopolamine (TRANSDERM-SCOP) 1 MG/3DAYS Place 1 patch (1.5 mg total) onto the skin every 3 (three) days. 11/24/23  Yes Kima Malenfant T, PA-C  aspirin EC 81 MG tablet Take 81 mg by mouth daily.    [provider]  atorvastatin  (LIPITOR) 40 MG tablet TAKE 1 TABLET (40 MG TOTAL) BY MOUTH DAILY. 07/20/23   Hilty, Aviva Lemmings, MD  empagliflozin  (JARDIANCE ) 25 MG TABS tablet Take 1 tablet (25 mg total) by mouth daily. 05/19/23   Noreene Bearded, PA  Evolocumab  (REPATHA  SURECLICK) 140 MG/ML SOAJ INJECT 140 MG INTO THE SKIN EVERY 14 DAYS. 11/03/23   Hilty, Aviva Lemmings, MD  fluticasone  (FLONASE ) 50 MCG/ACT nasal spray Place 2 sprays into both nostrils daily. Patient taking differently: Place 2 sprays into both nostrils daily as needed for allergies. 07/22/22   Lanetta Pion, NP  furosemide  (LASIX ) 40 MG tablet Take 0.5 tablets (20 mg total) by mouth daily. 09/21/23   Noreene Bearded, PA  levothyroxine  (SYNTHROID ) 50 MCG tablet TAKE 1 TABLET (50 MCG TOTAL) BY MOUTH DAILY BEFORE BREAKFAST. 09/04/23   Noreene Bearded, PA  meclizine (ANTIVERT) 25 MG tablet Take 1 tablet (  25 mg total) by mouth 3 (three) times daily as needed for dizziness. 11/24/23   Zailyn Thoennes T, PA-C  metoprolol  succinate (TOPROL -XL) 100 MG 24 hr tablet TAKE 1 TABLET BY MOUTH DAILY. TAKE WITH OR IMMEDIATELY FOLLOWING A MEAL. 03/23/23   Hilty, Aviva Lemmings, MD  ondansetron  (ZOFRAN -ODT) 4 MG disintegrating tablet Take 1 tablet (4 mg total) by mouth every 8 (eight) hours as needed for nausea or vomiting. 11/22/23   Mayers, Cari S, PA-C  pregabalin  (LYRICA ) 75 MG capsule TAKE 1 CAPSULE (75 MG TOTAL) BY MOUTH DAILY. TAKE 1 TO 3 HOURS BEFORE BEDTIME. CAN INCREASE TO 2 CAPSULES DAILY. 09/04/23   Noreene Bearded, PA   sacubitril -valsartan  (ENTRESTO ) 24-26 MG TAKE 1 TABLET BY MOUTH TWICE A DAY 04/13/23   Hilty, Aviva Lemmings, MD  Semaglutide , 2 MG/DOSE, (OZEMPIC , 2 MG/DOSE,) 8 MG/3ML SOPN Inject 2 mg into the skin once a week. 10/14/23   Laneta Pintos, MD  VITAMIN D  PO Take 1,000 Units by mouth daily.    [provider]  warfarin (COUMADIN ) 2 MG tablet TAKE 1 TO 1 AND 1/2 TABLETS BY MOUTH DAILY AS DIRECTED BY COUMADIN  CLINIC. Patient taking differently: Take 2-3 mg by mouth See admin instructions. Take 1.5 tablets by mouth every Monday and 1 tablet all other days 06/30/23   Hazle Lites, MD      Allergies    Niaspan [niacin er (antihyperlipidemic)] and Rosuvastatin     Review of Systems   Review of Systems  Eyes:  Positive for photophobia.  Gastrointestinal:  Positive for nausea and vomiting.  Neurological:  Positive for dizziness.  All other systems reviewed and are negative.   Physical Exam Updated Vital Signs BP (!) 159/64   Pulse 67   Temp 98.1 F (36.7 C)   Resp 16   Ht 5\' 3"  (1.6 m)   Wt 75.8 kg   LMP  (LMP Unknown)   SpO2 100%   BMI 29.58 kg/m  Physical Exam Vitals and nursing note reviewed.  Constitutional:      Appearance: Normal appearance.  HENT:     Head: Normocephalic and atraumatic.     Right Ear: Tympanic membrane, ear canal and external ear normal.     Left Ear: Tympanic membrane, ear canal and external ear normal.  Eyes:     General: Lids are normal.     Extraocular Movements:     Left eye: Nystagmus present.     Conjunctiva/sclera: Conjunctivae normal.  Cardiovascular:     Rate and Rhythm: Normal rate and regular rhythm.  Pulmonary:     Effort: Pulmonary effort is normal. No respiratory distress.     Breath sounds: Normal breath sounds.  Abdominal:     General: There is no distension.     Palpations: Abdomen is soft.     Tenderness: There is no abdominal tenderness.  Skin:    General: Skin is warm and dry.  Neurological:     General: No focal  deficit present.     Mental Status: She is alert.     Coordination: Finger-Nose-Finger Test normal.     Comments: Neuro: Speech is clear, able to follow commands. CN III-XII intact grossly intact. PERRLA. Sensation intact throughout. Str 5/5 all extremities. No pronator drift. Unsteady on feet, needing assistance to ambulate.      ED Results / Procedures / Treatments   Labs (all labs ordered are listed, but only abnormal results are displayed) Labs Reviewed  PROTIME-INR - Abnormal; Notable for the  following components:      Result Value   Prothrombin Time 32.3 (*)    INR 3.1 (*)    All other components within normal limits  APTT - Abnormal; Notable for the following components:   aPTT 42 (*)    All other components within normal limits  COMPREHENSIVE METABOLIC PANEL WITH GFR - Abnormal; Notable for the following components:   CO2 18 (*)    Glucose, Bld 159 (*)    Total Bilirubin 1.4 (*)    All other components within normal limits  I-STAT CHEM 8, ED - Abnormal; Notable for the following components:   BUN 25 (*)    Glucose, Bld 160 (*)    Calcium , Ion 1.12 (*)    TCO2 18 (*)    Hemoglobin 15.6 (*)    All other components within normal limits  CBG MONITORING, ED - Abnormal; Notable for the following components:   Glucose-Capillary 153 (*)    All other components within normal limits  CBC  DIFFERENTIAL  ETHANOL    EKG None  Radiology CT HEAD WO CONTRAST Result Date: 11/24/2023 CLINICAL DATA:  68 year old female with dizziness, nausea, vertigo. EXAM: CT HEAD WITHOUT CONTRAST TECHNIQUE: Contiguous axial images were obtained from the base of the skull through the vertex without intravenous contrast. RADIATION DOSE REDUCTION: This exam was performed according to the departmental dose-optimization program which includes automated exposure control, adjustment of the mA and/or kV according to patient size and/or use of iterative reconstruction technique. COMPARISON:  Head CT  09/19/2023 and earlier. FINDINGS: Brain: Cerebral volume remains normal for age. No midline shift, ventriculomegaly, mass effect, evidence of mass lesion, intracranial hemorrhage or evidence of cortically based acute infarction. Gray-white matter differentiation is within normal limits throughout the brain. Vascular: Calcified atherosclerosis at the skull base. No suspicious intracranial vascular hyperdensity. Skull: Intact.  No acute osseous abnormality identified. Sinuses/Orbits: Tympanic cavities, visualized paranasal sinuses and mastoids are clear. Other: Negative orbit and scalp soft tissues. IMPRESSION: Stable since February and normal for age noncontrast CT appearance of the Brain. Electronically Signed   By: Marlise Simpers M.D.   On: 11/24/2023 11:48    Procedures Procedures    Medications Ordered in ED Medications  sodium chloride  flush (NS) 0.9 % injection 3 mL (3 mLs Intravenous Given 11/24/23 1220)  diazepam  (VALIUM ) injection 5 mg (5 mg Intravenous Given 11/24/23 1256)  meclizine (ANTIVERT) tablet 25 mg (25 mg Oral Given 11/24/23 1508)    ED Course/ Medical Decision Making/ A&P                                 Medical Decision Making Amount and/or Complexity of Data Reviewed Labs: ordered. Radiology: ordered.  Risk Prescription drug management.  This patient is a 68 y.o. female  who presents to the ED for concern of dizziness.   Differential diagnoses prior to evaluation: The emergent differential diagnosis includes, but is not limited to,  BPPV, vestibular migraine, head trauma, AVM, intracranial tumor, multiple sclerosis, drug-related, CVA, orthostatic hypotension, sepsis, hypoglycemia, electrolyte disturbance, anemia, anxiety. This is not an exhaustive differential.   Past Medical History / Co-morbidities / Social History: diabetes, CAD s/p three-vessel bypass, aortic valve stenosis with mechanical valve replacement, hyperlipidemia, hypertension, PE/DVT on Coumadin , left bundle  branch block, hypothyroidism  Additional history: Chart reviewed. Pertinent results include: Reviewed ER visit note from 4/25, televisit notes from 4/27, and urgent care visit from today  Physical  Exam: Physical exam performed. The pertinent findings include: Hypertensive, otherwise normal vital signs.  No acute distress.  5/5 strength in all extremities, no pronator drift, no facial droop, no slurred speech.  Eyes with left-sided nystagmus, appears to be somewhat rotational.  Lab Tests/Imaging studies: I personally interpreted labs/imaging and the pertinent results include:  CBC normal, CMP unremarkable. INR 3.1, PT 32.3. Negative ethanol.    CT head without acute abnormalities. I agree with the radiologist interpretation.  Medications: I ordered medication including valium  and meclizine.  I have reviewed the patients home medicines and have made adjustments as needed. On reevaluation, patient feels fairly improved. Able to ambulate to the bathroom with little to no assistance.   Consultations obtained: I consulted with neurologist Dr Renaee Caro who recommended: follow up with ENT. Overall, based on patient's physical exam the clinical suspicion for central cause of patient's vertigo today is very low. Her nystagmus is only reproducible on the left, and she has no other concerning neurologic symptoms. While patient cannot obtain an MRI due to mechanical heart valve, the utility of doing so today would likely be low yield. Her symptoms and physical exam are most consistent with peripheral vertigo, considering possible BPPV or meniere's disease.   Strupp M, Kremmyda O, Adamczyk C, [place], [place], [place], [place] T. Central ocular motor disorders, including gaze palsy and nystagmus. J Neurol. 2014 Sep;261 Suppl 2(Suppl 2):W098-11. doi: 10.1007/s00415-([id number])472-1714-9. PMID: 91478295; PMCID: [id number].    Disposition: After consideration of the diagnostic results and the patients response to  treatment, I feel that emergency department workup does not suggest an emergent condition requiring admission or immediate intervention beyond what has been performed at this time. The plan is: discharge to home. Patient feeling somewhat improved after both valium  and meclizine. She was able to ambulate to the restroom and while she did not feel entirely better, she feels significantly improved compared to her prior. Will send prescriptions for meclizine, valium , and scopolamine patches to the pharmacy. Given ENT follow up and very strict return precautions. The patient and her daughter are pleased with this plan and all questions were answered to their satisfaction. The patient is safe for discharge and has been instructed to return immediately for worsening symptoms, change in symptoms or any other concerns.  I discussed this case with my attending physician Dr. Moses Arenas who cosigned this note including patient's presenting symptoms, physical exam, and planned diagnostics and interventions. Attending physician stated agreement with plan or made changes to plan which were implemented.   Final Clinical Impression(s) / ED Diagnoses Final diagnoses:  Peripheral vertigo involving left ear  Dizziness    Rx / DC Orders ED Discharge Orders          Ordered    meclizine (ANTIVERT) 25 MG tablet  3 times daily PRN        11/24/23 1534    diazepam  (VALIUM ) 2 MG tablet  Every 6 hours PRN        11/24/23 1534    scopolamine (TRANSDERM-SCOP) 1 MG/3DAYS  every 72 hours        11/24/23 1534           Portions of this report may have been transcribed using voice recognition software. Every effort was made to ensure accuracy; however, inadvertent computerized transcription errors may be present.    Glendi Mohiuddin T, PA-C 11/24/23 1644    Guadalupe Lee, MD 11/25/23 1530

## 2023-11-24 NOTE — ED Triage Notes (Signed)
 Pt here for dizziness and nausea x 5 days; seen at ED for same given some meds but now worse again; pt sts HA and light sensitivity; pt sts worse with sudden movements

## 2023-11-24 NOTE — Discharge Instructions (Signed)
 You were seen in the emergency department for dizziness.  Your blood work and CT scan looked reassuring. I suspect you have a peripheral vertigo, likely either caused by an imbalance of stones in your ears or from a more chronic disease such as meniere's disease.  I've written prescriptions for meclizine, valium , and scopolamine patches. You can use any or all of these for your symptoms.  Please follow up with the ENT specialist. I've attached contact information for 2 offices for you to call and schedule an appointment. Return to the ER for worsening symptoms.

## 2023-11-26 ENCOUNTER — Ambulatory Visit (INDEPENDENT_AMBULATORY_CARE_PROVIDER_SITE_OTHER): Admitting: Family Medicine

## 2023-11-26 ENCOUNTER — Encounter: Payer: Self-pay | Admitting: Family Medicine

## 2023-11-26 VITALS — BP 123/74 | HR 83 | Ht 63.0 in | Wt 164.1 lb

## 2023-11-26 DIAGNOSIS — R739 Hyperglycemia, unspecified: Secondary | ICD-10-CM | POA: Diagnosis not present

## 2023-11-26 DIAGNOSIS — K921 Melena: Secondary | ICD-10-CM | POA: Diagnosis not present

## 2023-11-26 DIAGNOSIS — R42 Dizziness and giddiness: Secondary | ICD-10-CM | POA: Diagnosis not present

## 2023-11-26 MED ORDER — PROMETHAZINE HCL 25 MG PO TABS: 25.0000 mg | ORAL_TABLET | Freq: Four times a day (QID) | ORAL | 2 refills | Status: DC | PRN

## 2023-11-26 MED ORDER — PREDNISONE 10 MG PO TABS: ORAL_TABLET | ORAL | 0 refills | Status: AC

## 2023-11-26 MED ORDER — DIAZEPAM 2 MG PO TABS: 2.0000 mg | ORAL_TABLET | Freq: Four times a day (QID) | ORAL | 0 refills | Status: DC | PRN

## 2023-11-26 NOTE — Progress Notes (Signed)
 Acute Office Visit  Subjective:     Patient ID: Katrina Obrien, female    DOB: 04-13-1956, 68 y.o.   MRN: [id number]  Chief Complaint  Patient presents with   Hospitalization Follow-up    HPI Subjective: - Woke up 1 week ago with severe vertigo, nausea, vomiting, light sensitivity. Called 911, went to ED. - Symptoms improved but still wobbly. Discharged from hospital. - Symptoms worsened again. Went to ED second time. Prescribed medications. Shelah Derry to urgent care, then back to ED. CT head and abdomen performed. - Now experiencing: constant dizziness, unsteady gait, needs to hold wall when walking, cannot drive. - Last night: abdominal cramping with bright red and bluish blood in stool. - No blackout feelings currently.  - PMHx: heart condition requiring warfarin, diverticulitis, diabetes. - Medications: warfarin, diabetes meds, plus new prescriptions from ED (meclizine , diazepam , scopolamine  patch).  Review of Systems: - Constitutional symptoms: Fatigue, malaise - Eyes: Light sensitivity initially - Ears, Nose, Mouth, Throat: Chronic tinnitus (unchanged), ear pain once during second ED visit - Cardiovascular: History of heart condition requiring warfarin - Respiratory: No cough - Gastrointestinal: Nausea, vomiting initially, abdominal cramping, rectal bleeding - Genitourinary:  - Musculoskeletal:  - Integumentary (Skin):  - Neurological: Vertigo, dizziness, unsteady gait, room spinning initially - Psychiatric:  - Endocrine: Diabetes - Hematologic/Lymphatic: Rectal bleeding - Allergic/Immunologic:    ROS      Objective:    BP 123/74   Pulse 83   Ht 5\' 3"  (1.6 m)   Wt 164 lb 1.9 oz (74.4 kg)   LMP  (LMP Unknown)   SpO2 98%   BMI 29.07 kg/m    Physical Exam General: Alert, oriented Neuro: Horizontal nystagmus, negative test of skew, positive head impulse.  Normal finger-to-nose testing.  Unsteady, wide-based gait  No results found for any visits on  11/26/23.      Assessment & Plan:   Vertigo Assessment & Plan: 1 week onset, waxes and wanes but has been persistent.  Not episodic in the sense of bppv.  No auditory symptoms to suggest meneires.  HINTS exam consistent with peripheral cause.  Stroke also ruled out in the hospital.  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  Sending in referral to ENT.  Promethazine  and diazepam  for sx relief.  Starting prednisone  60 mg x 1 week w/ a 1 week taper.  Checking inr today and will check again early next week and again next Thursday.  Advised pt to check her blood glucose frequently and alert us  to hyperglycemic values in the 300s or higher consistently.  In the case of hyperglycemia from steroid we will add lantus 10 U.  Orders: -     Comprehensive metabolic panel with GFR -     Ambulatory referral to ENT  Blood in stool Assessment & Plan: On warfarin.  Will get cbc and inr.   Orders: -     CBC with Differential/Platelet -     Protime-INR  Hyperglycemia -     Hemoglobin A1c  Other orders -     predniSONE ; Take 6 tablets (60 mg total) by mouth daily with breakfast for 7 days, THEN 5 tablets (50 mg total) daily with breakfast for 1 day, THEN 4 tablets (40 mg total) daily with breakfast for 1 day, THEN 3 tablets (30 mg total) daily with breakfast for 1 day, THEN 2 tablets (20 mg total) daily with breakfast for 1 day, THEN 1 tablet (10 mg total) daily with breakfast for 2 days.  Dispense: 58  tablet; Refill: 0 -     Promethazine  HCl; Take 1 tablet (25 mg total) by mouth every 6 (six) hours as needed for nausea or vomiting.  Dispense: 30 tablet; Refill: 2 -     diazePAM ; Take 1 tablet (2 mg total) by mouth every 6 (six) hours as needed (dizziness).  Dispense: 16 tablet; Refill: 0     Return in about 2 weeks (around 12/10/2023) for vertigo.  Laneta Pintos, MD

## 2023-11-26 NOTE — Patient Instructions (Signed)
 It was nice to see you today,  We addressed the following topics today: I believe you have something called vestibular neuritis.  This is often caused by a viral infection.  Treatment includes symptomatic relief of nausea and dizziness with medications and oral steroids to help speed up recovery - I am sending in a referral to [facility].  After I send in the referral you can call their office to see if they can get you in sooner than your other ENT referral - I am ordering some prednisone  to be tapered over the next 2 weeks.  Take 60 mg of prednisone  every day for the first 7 days, then taper by 10 mg every day after that - I prescribing a different antinausea medicine than your Zofran . - I am refilling the diazepam  for help with your dizziness. - I am ordering some blood tests to check your INR and CBC among other things. - I would like you to repeat your INR in about a week.  Have a great day,  Etha Henle, MD

## 2023-11-26 NOTE — Assessment & Plan Note (Signed)
 On warfarin.  Will get cbc and inr.

## 2023-11-26 NOTE — Assessment & Plan Note (Signed)
 1 week onset, waxes and wanes but has been persistent.  Not episodic in the sense of bppv.  No auditory symptoms to suggest meneires.  HINTS exam consistent with peripheral cause.  Stroke also ruled out in the hospital.  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  Sending in referral to ENT.  Promethazine  and diazepam  for sx relief.  Starting prednisone  60 mg x 1 week w/ a 1 week taper.  Checking inr today and will check again early next week and again next Thursday.  Advised pt to check her blood glucose frequently and alert us  to hyperglycemic values in the 300s or higher consistently.  In the case of hyperglycemia from steroid we will add lantus 10 U.

## 2023-11-27 ENCOUNTER — Other Ambulatory Visit: Payer: Self-pay | Admitting: *Deleted

## 2023-11-27 ENCOUNTER — Encounter: Payer: Self-pay | Admitting: Family Medicine

## 2023-11-27 DIAGNOSIS — Z7901 Long term (current) use of anticoagulants: Secondary | ICD-10-CM

## 2023-11-27 DIAGNOSIS — K921 Melena: Secondary | ICD-10-CM

## 2023-11-27 LAB — PROTIME-INR
INR: 3.9 — ABNORMAL HIGH (ref 0.9–1.2)
Prothrombin Time: 39.1 s — ABNORMAL HIGH (ref 9.1–12.0)

## 2023-11-30 ENCOUNTER — Other Ambulatory Visit

## 2023-11-30 DIAGNOSIS — K921 Melena: Secondary | ICD-10-CM | POA: Diagnosis not present

## 2023-11-30 DIAGNOSIS — Z7901 Long term (current) use of anticoagulants: Secondary | ICD-10-CM | POA: Diagnosis not present

## 2023-12-01 LAB — PROTIME-INR
INR: 4.2 — ABNORMAL HIGH (ref 0.9–1.2)
Prothrombin Time: 43 s — ABNORMAL HIGH (ref 9.1–12.0)

## 2023-12-02 ENCOUNTER — Other Ambulatory Visit: Payer: Self-pay | Admitting: *Deleted

## 2023-12-02 ENCOUNTER — Telehealth: Payer: Self-pay

## 2023-12-02 DIAGNOSIS — Z7901 Long term (current) use of anticoagulants: Secondary | ICD-10-CM

## 2023-12-02 NOTE — Telephone Encounter (Signed)
 Called pt again she stated that she already has her Rx PROMETHANZINE she picked up her Rx the same day it was prescribe pt also stated that she only going to take it when needed she states that she will discontinue taking MECLIZINE  and that she only has one left. I did not run it through Cover My Meds as of yet. It will be saved in the que.

## 2023-12-03 ENCOUNTER — Other Ambulatory Visit

## 2023-12-03 ENCOUNTER — Other Ambulatory Visit: Payer: Self-pay | Admitting: Family Medicine

## 2023-12-03 DIAGNOSIS — Z7901 Long term (current) use of anticoagulants: Secondary | ICD-10-CM

## 2023-12-03 DIAGNOSIS — E039 Hypothyroidism, unspecified: Secondary | ICD-10-CM

## 2023-12-04 LAB — PROTIME-INR
INR: 2.8 — ABNORMAL HIGH (ref 0.9–1.2)
Prothrombin Time: 28.8 s — ABNORMAL HIGH (ref 9.1–12.0)

## 2023-12-06 ENCOUNTER — Encounter: Payer: Self-pay | Admitting: Family Medicine

## 2023-12-07 NOTE — Telephone Encounter (Signed)
 Pt scheduled the appt for INR.  Steroid- Pt states that she is still dizzy anytime up moving around and occasional nausea, Ear pain left side yesterday. So she state she is not so sure how the steroid is working. Pt has appt scheduled for tomorrow.

## 2023-12-08 ENCOUNTER — Encounter: Payer: Self-pay | Admitting: Family Medicine

## 2023-12-08 ENCOUNTER — Ambulatory Visit (INDEPENDENT_AMBULATORY_CARE_PROVIDER_SITE_OTHER): Admitting: Family Medicine

## 2023-12-08 VITALS — BP 147/83 | HR 82 | Ht 63.0 in | Wt 166.0 lb

## 2023-12-08 DIAGNOSIS — Z7901 Long term (current) use of anticoagulants: Secondary | ICD-10-CM

## 2023-12-08 DIAGNOSIS — R42 Dizziness and giddiness: Secondary | ICD-10-CM

## 2023-12-08 MED ORDER — MECLIZINE HCL 25 MG PO TABS: 25.0000 mg | ORAL_TABLET | Freq: Three times a day (TID) | ORAL | 0 refills | Status: DC | PRN

## 2023-12-08 NOTE — Patient Instructions (Signed)
 It was nice to see you today,  We addressed the following topics today: -I am going to order some blood test.  I will let you know results when I get them - I am sending in referral to vestibular physical therapy - I will send in a new prescription for meclizine .  When you are taking the meclizine  I do not want you to take the scopolamine  or the promethazine  for nausea.  Have a great day,  Etha Henle, MD

## 2023-12-08 NOTE — Assessment & Plan Note (Signed)
 Had some improvement with prednisone .  Still having symptoms of dizziness and imbalance issues, as well as worsening tinnitus.  Is able to walk with a cane and can do things like walk her dog, but still feels unsteady/unsafe on her feet.  On exam she did have mild nystagmus with left and right with accompanying vertigo complaint.  Ambulation with and without cane was grossly normal, although slowed.  No wide-based gait.  Patient had significant difficulty with balance when walking heel-to-toe though.  Has appointment with ENT next week.  Patient agreeable to vestibular physical therapy.  Her fatigue is likely secondary to the medications for symptomatic relief such as scopolamine  and meclizine .  Advised her to remove the scopolamine  patch she is currently using and I have sent in meclizine  to use as needed.  Endorsed better symptom relief with meclizine .

## 2023-12-08 NOTE — Progress Notes (Signed)
 Established Patient Office Visit  Subjective   Patient ID: Katrina Obrien, female    DOB: 05-19-1956  Age: 68 y.o. MRN: [id number]  No chief complaint on file.   HPI Subjective - Continued dizziness and wobbling when walking or standing still - Symptoms improve when sitting or lying down - Completed course of steroids with some improvement - Fatigue, wanting to sleep all the time - Earache yesterday - Increased tinnitus in both ears, described as "buzzing" sound like "a thousand mosquitoes" - Has always had mild tinnitus but now more pronounced - No current nausea or vomiting - Reports significant improvement compared to 3 weeks ago - Limited driving - only local trips, avoiding highways - Moving carefully and slowly to avoid falling - Dizziness primarily with movement, not when sitting still - Using cane for stability - Uncomfortable in open spaces like grocery stores - able to walk dog with assistance  - No current blood in stool  Medications Currently using scopolamine  patch behind ear, has Phenergan  for nausea as needed but not taking regularly, not currently taking meclizine .  PMH, PSH, FH, Social Hx On warfarin therapy requiring INR monitoring.  ROS Positive: dizziness with movement, fatigue, tinnitus, recent earache. Negative: nausea, vomiting, blood in stool.     The ASCVD Risk score (Arnett DK, et al., 2019) failed to calculate for the following reasons:   The valid total cholesterol range is 130 to 320 mg/dL  Health Maintenance Due  Topic Date Due   Pneumonia Vaccine 74+ Years old (2 of 2 - PPSV23) 07/06/2018   COVID-19 Vaccine (4 - 2024-25 season) 03/29/2023   Medicare Annual Wellness (AWV)  02/03/2024      Objective:     BP (!) 147/83 (BP Location: Left Arm, Patient Position: Sitting, Cuff Size: Normal)   Pulse 82   Ht 5\' 3"  (1.6 m)   Wt 166 lb (75.3 kg)   LMP  (LMP Unknown)   SpO2 98%   BMI 29.41 kg/m    Physical Exam General: Alert,  oriented CV: Regular rhythm Pulmonary: Lungs clear bilaterally Neuro: Mild nystagmus, 3-5 beats, when looking left or right.  Negative cover/uncover test.  Gait grossly normal.  Worsening balance with attempting heel-to-toe walking.   No results found for any visits on 12/08/23.      Assessment & Plan:   Vertigo Assessment & Plan: Had some improvement with prednisone .  Still having symptoms of dizziness and imbalance issues, as well as worsening tinnitus.  Is able to walk with a cane and can do things like walk her dog, but still feels unsteady/unsafe on her feet.  On exam she did have mild nystagmus with left and right with accompanying vertigo complaint.  Ambulation with and without cane was grossly normal, although slowed.  No wide-based gait.  Patient had significant difficulty with balance when walking heel-to-toe though.  Has appointment with ENT next week.  Patient agreeable to vestibular physical therapy.  Her fatigue is likely secondary to the medications for symptomatic relief such as scopolamine  and meclizine .  Advised her to remove the scopolamine  patch she is currently using and I have sent in meclizine  to use as needed.  Endorsed better symptom relief with meclizine .  Orders: -     Comprehensive metabolic panel with GFR -     PT Vestibular Evaluation; Future  Long term (current) use of anticoagulants Assessment & Plan: Back on regular dosing schedule.  Most recent INR normal.  Stop taking prednisone  over the weekend.  Orders: -  CBC -     Protime-INR  Dizziness -     Meclizine  HCl; Take 1 tablet (25 mg total) by mouth 3 (three) times daily as needed for dizziness.  Dispense: 30 tablet; Refill: 0 -     PT Vestibular Evaluation; Future     Return in about 4 weeks (around 01/05/2024) for Vertigo.Laneta Pintos, MD

## 2023-12-08 NOTE — Assessment & Plan Note (Signed)
 Back on regular dosing schedule.  Most recent INR normal.  Stop taking prednisone  over the weekend.

## 2023-12-09 ENCOUNTER — Ambulatory Visit: Payer: Self-pay | Admitting: Family Medicine

## 2023-12-09 LAB — COMPREHENSIVE METABOLIC PANEL WITH GFR
ALT: 30 IU/L (ref 0–32)
AST: 14 IU/L (ref 0–40)
Albumin: 4.4 g/dL (ref 3.9–4.9)
Alkaline Phosphatase: 54 IU/L (ref 44–121)
BUN/Creatinine Ratio: 30 — ABNORMAL HIGH (ref 12–28)
BUN: 28 mg/dL — ABNORMAL HIGH (ref 8–27)
Bilirubin Total: 0.7 mg/dL (ref 0.0–1.2)
CO2: 21 mmol/L (ref 20–29)
Calcium: 9.8 mg/dL (ref 8.7–10.3)
Chloride: 102 mmol/L (ref 96–106)
Creatinine, Ser: 0.93 mg/dL (ref 0.57–1.00)
Globulin, Total: 2 g/dL (ref 1.5–4.5)
Glucose: 268 mg/dL — ABNORMAL HIGH (ref 70–99)
Potassium: 4.2 mmol/L (ref 3.5–5.2)
Sodium: 141 mmol/L (ref 134–144)
Total Protein: 6.4 g/dL (ref 6.0–8.5)
eGFR: 67 mL/min/{1.73_m2} (ref >59)

## 2023-12-09 LAB — PROTIME-INR
INR: 2.5 — ABNORMAL HIGH (ref 0.9–1.2)
Prothrombin Time: 25.7 s — ABNORMAL HIGH (ref 9.1–12.0)

## 2023-12-09 LAB — CBC
Hematocrit: 41.4 % (ref 34.0–46.6)
Hemoglobin: 13.4 g/dL (ref 11.1–15.9)
MCH: 31.4 pg (ref 26.6–33.0)
MCHC: 32.4 g/dL (ref 31.5–35.7)
MCV: 97 fL (ref 79–97)
Platelets: 267 10*3/uL (ref 150–450)
RBC: 4.27 x10E6/uL (ref 3.77–5.28)
RDW: 12.9 % (ref 11.7–15.4)
WBC: 14 10*3/uL — ABNORMAL HIGH (ref 3.4–10.8)

## 2023-12-10 ENCOUNTER — Other Ambulatory Visit

## 2023-12-14 ENCOUNTER — Ambulatory Visit (INDEPENDENT_AMBULATORY_CARE_PROVIDER_SITE_OTHER): Admitting: Audiology

## 2023-12-14 ENCOUNTER — Ambulatory Visit (INDEPENDENT_AMBULATORY_CARE_PROVIDER_SITE_OTHER): Admitting: Physician Assistant

## 2023-12-14 ENCOUNTER — Encounter (INDEPENDENT_AMBULATORY_CARE_PROVIDER_SITE_OTHER): Payer: Self-pay | Admitting: Physician Assistant

## 2023-12-14 VITALS — BP 122/76 | HR 85

## 2023-12-14 DIAGNOSIS — H903 Sensorineural hearing loss, bilateral: Secondary | ICD-10-CM | POA: Diagnosis not present

## 2023-12-14 DIAGNOSIS — R110 Nausea: Secondary | ICD-10-CM

## 2023-12-14 DIAGNOSIS — R42 Dizziness and giddiness: Secondary | ICD-10-CM

## 2023-12-14 MED ORDER — DIAZEPAM 2 MG PO TABS
2.0000 mg | ORAL_TABLET | Freq: Four times a day (QID) | ORAL | 0 refills | Status: DC | PRN
Start: 2023-12-14 — End: 2024-04-25

## 2023-12-14 MED ORDER — ONDANSETRON 4 MG PO TBDP: 4.0000 mg | ORAL_TABLET | Freq: Three times a day (TID) | ORAL | 0 refills | Status: DC | PRN

## 2023-12-14 NOTE — Progress Notes (Unsigned)
 Dear Dr. Lawanda Prayer, Here is my assessment for our mutual patient, Katrina Obrien. Thank you for allowing me the opportunity to care for your patient. Please do not hesitate to contact me should you have any other questions. Sincerely, Belma Boxer PA-C  Otolaryngology Clinic Note Referring provider: Dr. Lawanda Prayer HPI:  Katrina Obrien is a 68 y.o. female kindly referred by Dr. Lawanda Prayer   The patient is a 68 year old female seen in our office for evaluation of vertigo.  The patient notes that approximately 3.5 weeks ago she was in her usual state of health.  She woke up in the morning she was sick, nauseous and felt like passing out.  She notes that any movements including trying to get out of bed she felt like she was going to pass out.  She was able to get to the floor but was unable to crawl, any attempt at lifting her head caused the room to spin.  She notes that she called 911.  She was taken to the emergency room.  She was treated in the emergency room for vertigo with fluids, laboratory analysis, EKG/troponin/BNP.  She had some subjective improvement and was subsequently discharged.  She notes she had to go back to the emergency room several days later after continued symptoms.  She had a CT head without contrast that was normal. She notes that at the time of her symptoms she did have ringing in her ears which she had only had intermittently prior to that.  She was uncertain if she had any significant changes in hearing.  She notes the initial episode was very severe and over the last 3-1/2 weeks symptoms have improved but she is not back to her baseline.  She was prescribed meclizine  which has not helped her symptoms she was also prescribed Valium  and antinausea medication.  She notes that Zofran  has helped her.  Throughout the entire episode including up to now she denies any other neurologic deficits.  She notes a history of migraines, but notes this feels different, she denies any previous history of  vertigo.  She denies any upper respiratory symptoms or infectious symptoms around the same time.  She does have a history of mechanical heart valve and is unable to have an MRI.  She currently takes warfarin for this.  She denies any increase in salt intake and eats a relatively low salt diet.    Independent Review of Additional Tests or Records:  Audiological evaluation on 12/10/2023-  Otoscopy: Right ear: Clear external ear canals and notable landmarks visualized on the tympanic membrane. Left ear:  Clear external ear canals and notable landmarks visualized on the tympanic membrane.   Tympanometry: Right ear: Type As- Normal external ear canal volume with normal middle ear pressure and low tympanic membrane compliance. Left ear: Type As- Normal external ear canal volume with normal middle ear pressure and low tympanic membrane compliance.   Pure tone Audiometry: Right ear- Normal to moderate sensorineural hearing loss from 125 Hz - 8000 Hz. Left ear-  Normal to moderate sensorineural hearing loss from 125 Hz - 8000 Hz.   Speech Audiometry: Right ear- Speech Reception Threshold (SRT) was obtained at 35 dBHL. Left ear-Speech Awareness Threshold (SAT) was observed at 35 dBHL.   Word Recognition Score Tested using NU-6 (MLV) Right ear: 88% was obtained at a presentation level of 80 dBHL with contralateral masking which is deemed as  good . Left ear: 88% was obtained at a presentation level of 80 dBHL with contralateral masking which  is deemed as  good .   The hearing test results were completed under headphones and results are deemed to be of good reliability. 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   Clotting disorder (HCC)    anticoagulant - coumadin  hx DVT, PE   Coronary artery disease     s/p 3- vessel bypass    Diabetes mellitus without complication (HCC)    DVT (deep venous thrombosis) (HCC)    Heart murmur    Hyperlipidemia    Hypertension    Ischemic cardiomyopathy    40 to 45%   Left bundle branch block    Mechanical heart valve present    Jan 2012   Popliteal artery occlusion, left (HCC)    recent  status  post angioplasty   Pulmonary embolism (HCC)    associated with DVT   Pulmonary embolus (HCC)    Thyroid  disease    hyperthyroidism     Past Surgical History:  Procedure Laterality Date   AORTIC VALVE REPLACEMENT (AVR)/CORONARY ARTERY BYPASS GRAFTING (CABG)  08/16/2010  VAN TRIGT   AV replacement w/19-mm mechanical St Jude valve ([id number]),CABGX3  lima to LAD, SVG to distal circ,SVG to posterior descending   ASD REPAIR     CARDIAC CATHETERIZATION  08/08/2010   ,LAD 50% PROX,605 TANDEM SEGMENTAL STENOSIS,LEFT CIRC 60%-AV GROOVE, RGT COR 40% PROX ON THE BEND ,80% PROX PDA,   CORONARY ARTERY BYPASS GRAFT  08/16/2010   LIMA TO lad,svg to distal circ ,svg to posterior descending   DOPPLER ECHOCARDIOGRAPHY  08/09/2010   EF35% to 40% ,bbicuspid ;severely  thickened ,severely calcified leaflets, LV normal   LEA doppler  08/07/2010   left popliteal occlusion ,and anterior tibial complete occlusion   NM MYOCAR PERF WALL MOTION  08/06/2010   EF 31%  LOW RISK SCAN   nuc     PV ANGIOGRAM  08/08/2010   successful PTA LEFT POPLITEAL    Family History  Problem Relation Age of Onset   Cancer Mother    Cancer Father    CAD Father    Heart attack Father 42       first MI, 2 major after that, multiple silent MIs   Stroke Brother    CAD Brother 48       first stent   Heart attack Brother    CAD Brother 48       CABG    Hypertension Brother    Heart attack Brother 45       died from MI   Colon cancer Neg Hx    Esophageal cancer Neg Hx    Rectal cancer Neg Hx    Stomach cancer Neg Hx      Social Connections: Socially Integrated (09/14/2023)   Social  Connection and Isolation Panel [NHANES]    Frequency of Communication with Friends and Family: More than three times a week    Frequency of Social Gatherings with Friends and Family: More than three times a week    Attends Religious Services: 1 to 4 times per year    Active Member of [employer] or Organizations: Yes    Attends Banker Meetings: 1 to 4 times per year    Marital Status: Married      Current Outpatient Medications:    aspirin EC 81  MG tablet, Take 81 mg by mouth daily., Disp: , Rfl:    atorvastatin  (LIPITOR) 40 MG tablet, TAKE 1 TABLET (40 MG TOTAL) BY MOUTH DAILY., Disp: 90 tablet, Rfl: 3   diazepam  (VALIUM ) 2 MG tablet, Take 1 tablet (2 mg total) by mouth every 6 (six) hours as needed (dizziness)., Disp: 16 tablet, Rfl: 0   empagliflozin  (JARDIANCE ) 25 MG TABS tablet, Take 1 tablet (25 mg total) by mouth daily., Disp: 90 tablet, Rfl: 0   Evolocumab  (REPATHA  SURECLICK) 140 MG/ML SOAJ, INJECT 140 MG INTO THE SKIN EVERY 14 DAYS., Disp: 6 mL, Rfl: 3   fluticasone  (FLONASE ) 50 MCG/ACT nasal spray, Place 2 sprays into both nostrils daily. (Patient taking differently: Place 2 sprays into both nostrils daily as needed for allergies.), Disp: 16 g, Rfl: 0   furosemide  (LASIX ) 40 MG tablet, Take 0.5 tablets (20 mg total) by mouth daily., Disp: 45 tablet, Rfl: 2   levothyroxine  (SYNTHROID ) 50 MCG tablet, TAKE 1 TABLET BY MOUTH DAILY BEFORE BREAKFAST, Disp: 90 tablet, Rfl: 1   meclizine  (ANTIVERT ) 25 MG tablet, Take 1 tablet (25 mg total) by mouth 3 (three) times daily as needed for dizziness., Disp: 30 tablet, Rfl: 0   metoprolol  succinate (TOPROL -XL) 100 MG 24 hr tablet, TAKE 1 TABLET BY MOUTH DAILY. TAKE WITH OR IMMEDIATELY FOLLOWING A MEAL., Disp: 90 tablet, Rfl: 3   ondansetron  (ZOFRAN -ODT) 4 MG disintegrating tablet, Take 1 tablet (4 mg total) by mouth every 8 (eight) hours as needed for nausea or vomiting., Disp: 20 tablet, Rfl: 0   pregabalin  (LYRICA ) 75 MG capsule, TAKE 1  CAPSULE (75 MG TOTAL) BY MOUTH DAILY. TAKE 1 TO 3 HOURS BEFORE BEDTIME. CAN INCREASE TO 2 CAPSULES DAILY., Disp: 90 capsule, Rfl: 2   sacubitril -valsartan  (ENTRESTO ) 24-26 MG, TAKE 1 TABLET BY MOUTH TWICE A DAY, Disp: 180 tablet, Rfl: 3   Semaglutide , 2 MG/DOSE, (OZEMPIC , 2 MG/DOSE,) 8 MG/3ML SOPN, Inject 2 mg into the skin once a week., Disp: 9 mL, Rfl: 1   VITAMIN D  PO, Take 1,000 Units by mouth daily., Disp: , Rfl:    warfarin (COUMADIN ) 2 MG tablet, TAKE 1 TO 1 AND 1/2 TABLETS BY MOUTH DAILY AS DIRECTED BY COUMADIN  CLINIC. (Patient taking differently: Take 2-3 mg by mouth See admin instructions. Take 1.5 tablets by mouth every Monday and 1 tablet all other days), Disp: 120 tablet, Rfl: 1   Physical Exam:   BP 122/76   Pulse 85   LMP  (LMP Unknown)   SpO2 97%   Pertinent Findings  CN II-XII intact-no nystagmus, upper extremity strength 5 out of 5 throughout major muscle groups sensation intact, no pronator drift Walking with a cane for stability Bilateral EAC clear and TM intact with well pneumatized middle ear spaces Anterior rhinoscopy: Septum ; bilateral inferior turbinates with no hypertrophy No lesions of oral cavity/oropharynx; dentition within normal limits No obviously palpable neck masses/lymphadenopathy/thyromegaly No respiratory distress or stridor  Seprately Identifiable Procedures:  None  Impression & Plans:  Katrina Obrien is a 68 y.o. female with the following   Vertigo-  This is a 68 year old female seen in our office for evaluation of vertigo.  Unclear etiology at this time.  She did have a rather severe episode and has been getting better over the last 3 to 4 weeks.  Symptoms are not consistent with BPPV, question Mnire's disease given the ringing, although no clear hearing loss at the time of the episode.  She has no acute neurologic deficits.  She does have a history of migraines, given the duration of symptoms low suspicion for vestibular migraine, no preceding  infections or indication that this would be secondary to vestibular neuritis.  She is slowly improving although she is not back at her baseline I do feel that referral to neurorehab for vestibular testing/rehab would be beneficial.  She is unable to receive an MRI due to her mechanical heart valve.  She did have a CT noncontrast of the head that was normal although the likelihood of this showing any area of infarct or significant abnormality is low.  - f/u after vestibular rehab  Can't have CT,   Need vestibular evaluation. And rehab   Talk to patel about imaging   Thank you for allowing me the opportunity to care for your patient. Please do not hesitate to contact me should you have any other questions.  Sincerely, Belma Boxer PA-C  [facility] Phone: [phone] Fax: [phone]  12/14/2023, 2:48 PM

## 2023-12-15 NOTE — Progress Notes (Signed)
  [address], [address] [place], [place] [address] [phone]  Audiological Evaluation    Name: Katrina Obrien     DOB:   [birth year]      MRN:   [id number]                                                                                     Service Date: 12/15/2023     Accompanied by: unaccompanied   Patient comes today after Lorane Rocker, PA-C sent a referral for a hearing evaluation due to concerns with dizziness.   Symptoms Yes Details  Hearing loss  []    Tinnitus  []  Crickets like in both ears, sometimes is louder perceived since 3 weeks ago  Ear pain/ infections/pressure  [x]  Reports some pain that comes and goes - left ear  Balance problems  [x]  3 weeks ago woke up sick on her stomach , feeling she was going to paas out and room was spinning. - after spinning went away she was dizzy "woozy".   Noise exposure history  []    Previous ear surgeries  []    Family history of hearing loss  []    Amplification  []    Other  [x]  Reports headaches, used to have migraines    Otoscopy: Right ear: Clear external ear canals and notable landmarks visualized on the tympanic membrane. Left ear:  Clear external ear canals and notable landmarks visualized on the tympanic membrane.  Tympanometry: Right ear: Type As- Normal external ear canal volume with normal middle ear pressure and low tympanic membrane compliance. Left ear: Type As- Normal external ear canal volume with normal middle ear pressure and low tympanic membrane compliance.   Pure tone Audiometry: Right ear- Normal to moderate sensorineural hearing loss from 125 Hz - 8000 Hz. Left ear-  Normal to moderate sensorineural hearing loss from 125 Hz - 8000 Hz.  Speech Audiometry: Right ear- Speech Reception Threshold (SRT) was obtained at 35 dBHL. Left ear-Speech Awareness Threshold (SAT) was observed at 35 dBHL.   Word Recognition Score Tested using NU-6 (MLV) Right ear: 88% was obtained at a presentation level of 80 dBHL  with contralateral masking which is deemed as  good . Left ear: 88% was obtained at a presentation level of 80 dBHL with contralateral masking which is deemed as  good .   The hearing test results were completed under headphones and results are deemed to be of good reliability. Test technique:  conventional     Recommendations: Follow up with ENT as scheduled for today. Return for a hearing evaluation if concerns with hearing changes arise or per MD recommendation.   Daneisha Surges MARIE LEROUX-MARTINEZ, AUD

## 2023-12-22 ENCOUNTER — Encounter: Payer: Self-pay | Admitting: Audiology

## 2023-12-28 ENCOUNTER — Ambulatory Visit: Attending: Internal Medicine

## 2023-12-28 DIAGNOSIS — Z7901 Long term (current) use of anticoagulants: Secondary | ICD-10-CM

## 2023-12-28 LAB — POCT INR: INR: 3.9 — AB (ref 2.0–3.0)

## 2023-12-28 NOTE — Patient Instructions (Signed)
 Description   Only take 1/2 tablet tonight and then continue taking warfarin 1 tablet daily except 1.5 tablets each Monday.  Repeat INR in 4 weeks. Coumadin  Clinic [phone]

## 2023-12-31 ENCOUNTER — Ambulatory Visit: Attending: Physician Assistant | Admitting: Physical Therapy

## 2023-12-31 VITALS — BP 128/70 | HR 69

## 2023-12-31 DIAGNOSIS — R42 Dizziness and giddiness: Secondary | ICD-10-CM | POA: Insufficient documentation

## 2023-12-31 DIAGNOSIS — R262 Difficulty in walking, not elsewhere classified: Secondary | ICD-10-CM | POA: Insufficient documentation

## 2023-12-31 DIAGNOSIS — R2681 Unsteadiness on feet: Secondary | ICD-10-CM | POA: Insufficient documentation

## 2023-12-31 NOTE — Therapy (Unsigned)
 OUTPATIENT PHYSICAL THERAPY VESTIBULAR EVALUATION     Patient Name: Katrina Obrien MRN: [id number] DOB:Dec 22, 1955, 68 y.o., female Today's Date: 01/01/2024  END OF SESSION:  PT End of Session - 01/01/24 2050     Visit Number 1    Number of Visits 17    Date for PT Re-Evaluation 02/26/24    Authorization Type [employer]    Authorization Time Period 12-31-23 - 03-04-24    PT Start Time 1450    PT Stop Time 1535    PT Time Calculation (min) 45 min    Activity Tolerance Other (comment)   limited by dizziness   Behavior During Therapy [facility] for tasks assessed/performed             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   Clotting disorder (HCC)    anticoagulant - coumadin  hx DVT, PE   Coronary artery disease    s/p 3- vessel bypass    Diabetes mellitus without complication (HCC)    DVT (deep venous thrombosis) (HCC)    Heart murmur    Hyperlipidemia    Hypertension    Ischemic cardiomyopathy    40 to 45%   Left bundle branch block    Mechanical heart valve present    Jan 2012   Popliteal artery occlusion, left (HCC)    recent  status  post angioplasty   Pulmonary embolism (HCC)    associated with DVT   Pulmonary embolus (HCC)    Thyroid  disease    hyperthyroidism   Past Surgical History:  Procedure Laterality Date   AORTIC VALVE REPLACEMENT (AVR)/CORONARY ARTERY BYPASS GRAFTING (CABG)  08/16/2010  VAN TRIGT   AV replacement w/19-mm mechanical St Jude valve ([id number]),CABGX3  lima to LAD, SVG to distal circ,SVG to posterior descending   ASD REPAIR     CARDIAC CATHETERIZATION  08/08/2010   ,LAD 50% PROX,605 TANDEM SEGMENTAL STENOSIS,LEFT CIRC 60%-AV GROOVE, RGT COR 40% PROX ON THE BEND ,80% PROX PDA,   CORONARY ARTERY BYPASS GRAFT  08/16/2010   LIMA TO lad,svg to distal circ ,svg to posterior descending   DOPPLER ECHOCARDIOGRAPHY   08/09/2010   EF35% to 40% ,bbicuspid ;severely  thickened ,severely calcified leaflets, LV normal   LEA doppler  08/07/2010   left popliteal occlusion ,and anterior tibial complete occlusion   NM MYOCAR PERF WALL MOTION  08/06/2010   EF 31%  LOW RISK SCAN   nuc     PV ANGIOGRAM  08/08/2010   successful PTA LEFT POPLITEAL   Patient Active Problem List   Diagnosis Date Noted   Vertigo 11/26/2023   Blood in stool 11/26/2023   Osteoporosis 09/21/2023   Head injury 09/14/2023   Rib pain 09/14/2023   Restless leg syndrome 02/16/2023   Plantar fasciitis of right foot 06/16/2022   Caregiver stress syndrome 03/08/2019   Vitamin D  insufficiency 09/13/2018   White coat syndrome with diagnosis of hypertension 09/13/2018   Hyperlipidemia associated with type 2 diabetes mellitus (HCC) 02/03/2018   Hypothyroidism 02/03/2018   LBBB (left bundle branch block) 04/06/2014   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   Aortic stenosis 01/26/2013   S/P AVR 01/26/2013   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   Hypertension associated with diabetes (HCC) 01/26/2013   Familial hyperlipidemia 01/26/2013   Cardiomyopathy, ischemic 01/26/2013   DVT (deep venous thrombosis) (HCC) 01/26/2013   Pulmonary embolus (  HCC) 01/26/2013   Uncontrolled type 2 diabetes mellitus with hyperglycemia (HCC) 01/26/2013   Long term (current) use of anticoagulants 10/11/2012    PCP: Laneta Pintos., MD REFERRING PROVIDER: Lorane Rocker, PA-C  REFERRING DIAG:  Diagnosis  R42 (ICD-10-CM) - Vertigo    THERAPY DIAG:  Dizziness and giddiness - Plan: PT plan of care cert/re-cert  Difficulty in walking, not elsewhere classified - Plan: PT plan of care cert/re-cert  Unsteadiness on feet - Plan: PT plan of care cert/re-cert  ONSET DATE: 11-19-23  Rationale for Evaluation and Treatment: Rehabilitation  SUBJECTIVE:   SUBJECTIVE STATEMENT: Pt reports dizziness started 6 weeks ago; woke up and was very sick and dizzy; had  severe vomiting called EMS and transported to hospital - ruled out CVA, MI; states the dizziness got worse over the weekend - went to [facility] on Monday and they sent her back to [facility] ED.  Sudden movements of head make her dizzy - stays nauseous. Is concerned about the HA's; pt fell in Feb. And they did 2 CT scans but did not reveal anything.   Pt accompanied by: self  PERTINENT HISTORY: Per chart note "Katrina Obrien is a 68 y.o. female with history of diabetes, CAD s/p three-vessel bypass, aortic valve stenosis with mechanical valve replacement, hyperlipidemia, hypertension, PE/DVT on Coumadin , left bundle branch block, hypothyroidism, who presents the emergency department with dizziness.  Per patient report, symptoms started waking her up from sleep late Thursday night, 5 days ago.  She was doing some work when she suddenly felt very lightheaded like she might lose consciousness.  She laid on the couch and her symptoms resolved and she went to bed as normal.  She woke up around 2 AM and felt like she was going to fall out of the bed and vomit.  She tried to walk to the bathroom and had to lay down in the hallway.  She then called 911.  She was brought to the ER and had evaluation, including cardiac workup.  She had a CT scan of her abdomen that was normal as well.  She was sent home after her symptoms were improving with medications given."  PAIN:  Are you having pain? Headache - started last night  PRECAUTIONS: Other: dizziness - fall risk  RED FLAGS: None   WEIGHT BEARING RESTRICTIONS: No  FALLS: Has patient fallen in last 6 months? Yes. Number of falls 1  LIVING ENVIRONMENT: Lives with: lives with their spouse Lives in: House/apartment  PLOF: Independent  PATIENT GOALS: resolve the dizziness - "find out what I can do to make it better"  OBJECTIVE:  Note: Objective measures were completed at Evaluation unless otherwise noted.  DIAGNOSTIC FINDINGS: IMPRESSION: CT scan Stable since  February and normal for age noncontrast CT appearance of the Brain.  COGNITION: Overall cognitive status: Within functional limits for tasks assessed  POSTURE:  No Significant postural limitations  Cervical ROM:  WNL's  BED MOBILITY:  Independent   TRANSFERS: Assistive device utilized: None  Sit to stand: Complete Independence  GAIT: Gait pattern: WFL Distance walked: 83' Assistive device utilized: None Level of assistance: Modified independence Comments: decreased speed due to dizziness; decreased head turns during amb. Due to dizziness  FUNCTIONAL TESTS:  Pt able to stand Romberg with EO for 30 secs; with EC for 17 secs with moderate postural sway   PATIENT SURVEYS:  DHI to be completed (gave to pt at eval)  VESTIBULAR ASSESSMENT:  GENERAL OBSERVATION: pt is a 68 yr old  lady with onset of dizziness that started 6 weeks ago; pt is unable to turn her head without experiencing moderate to severe dizziness   SYMPTOM BEHAVIOR:  Subjective history: pt reports onset of dizziness 6 weeks ago - had severe vomiting - called 911; MRI and CT scan negative for MI and CVA; pt evaluated by ENT but no etiology of dizziness determined - possibly vestibular neuritis; migraine ruled out.  Pt states she is much improved from initial onset. Is now driving but minimizes head movements  Non-Vestibular symptoms: changes in vision, headaches, tinnitus, nausea/vomiting, migraine symptoms, and tinnitus has gotten louder since dizziness onset; had a migraine on Sunday   Type of dizziness: Blurred Vision, Imbalance (Disequilibrium), Unsteady with head/body turns, Lightheadedness/Faint, "World moves", and needs to move slowly  Frequency: mostly constant  Duration: mostly constant   Aggravating factors: Induced by motion: occur when walking, bending down to the ground, turning body quickly, turning head quickly, and sitting in a moving car, Worse with fatigue, Worse in the dark, and worse in grocery  store  Relieving factors: head stationary, lying supine, rest, slow movements, and avoid busy/distracting environments  Progression of symptoms: better  OCULOMOTOR EXAM:  Ocular Alignment: normal  Ocular ROM: No Limitations  Spontaneous Nystagmus: absent  Gaze-Induced Nystagmus: absent  Smooth Pursuits: mild nystagmus noted with horizontal smooth pursuits - c/o moderate dizziness with this testing  Saccades: intact     VESTIBULAR - OCULAR REFLEX:   Slow VOR: Comment: not tested due to c/o dizziness  VOR Cancellation: Comment: c/o dizziness  Head-Impulse Test: HIT Right: positive HIT Left: positive unable to maintain gaze on PT's nose with Rt and Lt HIT  Dynamic Visual Acuity: Not able to be assessed   POSITIONAL TESTING: NT based on pt's symptomology   MOTION SENSITIVITY:  Motion Sensitivity Quotient Intensity: 0 = none, 1 = Lightheaded, 2 = Mild, 3 = Moderate, 4 = Severe, 5 = Vomiting  Intensity  1. Sitting to supine   2. Supine to L side   3. Supine to R side   4. Supine to sitting   5. L Hallpike-Dix   6. Up from L    7. R Hallpike-Dix   8. Up from R    9. Sitting, head tipped to L knee 0  10. Head up from L knee 3  11. Sitting, head tipped to R knee 0  12. Head up from R knee 3  13. Sitting head turns x5 4  14.Sitting head nods x5 4  15. In stance, 180 turn to L    16. In stance, 180 turn to R                                                                                                                               TREATMENT DATE: 12-31-23   Gaze Adaptation:  x1 Viewing Horizontal: Position: seated, Time: 15 secs, Reps: 1, and Comment: c/o dizziness provoked and x1 Viewing  Vertical:  Position: seated, Time: 13 secs, Reps: 1, and Comment: c/o dizziness with each direction - no major difference reported     PATIENT EDUCATION: Education details: discussed etiology of dizziness is most consistent with vestibular neuritis - explained this diagnosis to pt;  issued HEP of x1 viewing exercise - seated position, walking program recommended and habituation (head turns) Person educated: Patient Education method: Explanation, Demonstration, and Handouts Education comprehension: verbalized understanding and needs further education  HOME EXERCISE PROGRAM: x1 viewing exercise  GOALS: Goals reviewed with patient? Yes  SHORT TERM GOALS: Target date: 01-29-24  Pt will perform x1 viewing exercise in standing position for 60 secs both horizontal and vertical directions with c/o dizziness </= 4/10 intensity to improve gaze stabilization. Baseline: Goal status: INITIAL  2.  Assess DVA and establish LTG. Baseline:  Goal status: INITIAL  3.  Improve DHI score by at least 14 points to demo improvement in dizziness. Baseline: to be completed  Goal status: INITIAL  4.  Assess mCTSIB and establish LTG. Baseline:  Goal status: INITIAL  5.  Amb. 30' with horizontal head turns with minimal postural instability with c/o dizziness </= 5/10 intensity. Baseline:  Goal status: INITIAL  6.  Independent in HEP for balance and vestibular exercises. Baseline:  Goal status: INITIAL  LONG TERM GOALS: Target date: 02-26-23  Improve DVA to </= 3 line difference for improved gaze stabilization. Baseline:  Goal status: INITIAL  2.  Improve DHI score by at least 50% of initial score. Baseline:  Goal status: INITIAL  3.  Pt will amb. 20' with horizontal head turns without LOB with c/o dizziness </= 3/10.  Baseline:  Goal status: INITIAL  4.  Improve FGA score by at least 8 points to demo improved balance and reduced fall risk with ambulation. Baseline:  Goal status: INITIAL  5.  Demonstrate increased vestibular input by standing on compliant surface with EC for 30 secs without LOB. Baseline:  Goal status: INITIAL  6.  Independent in updated HEP for balance and vestibular exercises. Baseline:  Goal status: INITIAL  7. Report at least 50% improvement in  headaches for increased ease with ADL's and mobility and caregiving tasks.            Baseline: c/o moderate headaches daily           Goal status:  INITIAL  ASSESSMENT:  CLINICAL IMPRESSION: Patient is a 68 y.o. lady who was seen today for physical therapy evaluation and treatment for dizziness of unknown etiology with onset 6 weeks ago.  Pt reports moderate to severe dizziness with head turns. Pt is ambulating without device but with very slow speed with inability to turn head during amb. without experiencing LOB.  Pt has impaired VOR with inability to tolerate DVA due to severity of dizziness.  VOR cancellation testing provoked moderate to severe dizziness.  Pt able to stand for 30 secs in Romberg position with EO but only for 17 secs with EC.  Balance on foam not assessed due to difficulty with maintaining balance on floor with EC.  Pt will benefit from PT to address dizziness, gait and balance deficits.    OBJECTIVE IMPAIRMENTS: decreased balance, difficulty walking, dizziness, and headaches.   ACTIVITY LIMITATIONS: bending, squatting, transfers, bathing, locomotion level, and caring for others  PARTICIPATION LIMITATIONS: meal prep, cleaning, laundry, driving, shopping, community activity, and yard work  PERSONAL FACTORS: 1 comorbidity: h/o mechanical valve replacement and dizziness of unknown etiology  are also affecting patient's functional outcome.  REHAB POTENTIAL: Good  CLINICAL DECISION MAKING: Evolving/moderate complexity  EVALUATION COMPLEXITY: Moderate   PLAN:  PT FREQUENCY: 1-2x/week  PT DURATION: 8 weeks + eval  PLANNED INTERVENTIONS: 97110-Therapeutic exercises, 97530- Therapeutic activity, [id number]- Neuromuscular re-education, [phone]- Self Care, [id number]- Gait training, [phone]- Aquatic Therapy, Patient/Family education, and Vestibular training  PLAN FOR NEXT SESSION: calculate DHI:  gaze stabilization; Do FGA, mCTSIB   Tycho Cheramie, Celeste Cola, PT 01/01/2024, 10:07 PM

## 2024-01-01 ENCOUNTER — Other Ambulatory Visit: Payer: Self-pay | Admitting: Family Medicine

## 2024-01-01 ENCOUNTER — Encounter: Payer: Self-pay | Admitting: Physical Therapy

## 2024-01-01 DIAGNOSIS — E1165 Type 2 diabetes mellitus with hyperglycemia: Secondary | ICD-10-CM

## 2024-01-05 ENCOUNTER — Ambulatory Visit: Admitting: Physical Therapy

## 2024-01-05 DIAGNOSIS — R42 Dizziness and giddiness: Secondary | ICD-10-CM | POA: Diagnosis not present

## 2024-01-05 DIAGNOSIS — R2681 Unsteadiness on feet: Secondary | ICD-10-CM

## 2024-01-05 DIAGNOSIS — R262 Difficulty in walking, not elsewhere classified: Secondary | ICD-10-CM

## 2024-01-05 NOTE — Therapy (Signed)
 OUTPATIENT PHYSICAL THERAPY VESTIBULAR TREATMENT NOTE     Patient Name: Katrina Obrien MRN: [id number] DOB:10-25-1955, 68 y.o., female Today's Date: 01/08/2024  END OF SESSION:  PT End of Session - 01/08/24 1159     Visit Number 2    Number of Visits 17    Date for PT Re-Evaluation 02/26/24    Authorization Type [employer]    Authorization Time Period 12-31-23 - 03-04-24    Authorization - Visit Number 2    Authorization - Number of Visits 17    PT Start Time [phone]    PT Stop Time 0930    PT Time Calculation (min) 44 min    Equipment Utilized During Treatment Gait belt    Activity Tolerance Patient tolerated treatment well   limited by dizziness   Behavior During Therapy WFL for tasks assessed/performed           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   Clotting disorder (HCC)    anticoagulant - coumadin  hx DVT, PE   Coronary artery disease    s/p 3- vessel bypass    Diabetes mellitus without complication (HCC)    DVT (deep venous thrombosis) (HCC)    Heart murmur    Hyperlipidemia    Hypertension    Ischemic cardiomyopathy    40 to 45%   Left bundle branch block    Mechanical heart valve present    Jan 2012   Popliteal artery occlusion, left (HCC)    recent  status  post angioplasty   Pulmonary embolism (HCC)    associated with DVT   Pulmonary embolus (HCC)    Thyroid  disease    hyperthyroidism   Past Surgical History:  Procedure Laterality Date   AORTIC VALVE REPLACEMENT (AVR)/CORONARY ARTERY BYPASS GRAFTING (CABG)  08/16/2010  VAN TRIGT   AV replacement w/19-mm mechanical St Jude valve ([id number]),CABGX3  lima to LAD, SVG to distal circ,SVG to posterior descending   ASD REPAIR     CARDIAC CATHETERIZATION  08/08/2010   ,LAD 50% PROX,605 TANDEM SEGMENTAL STENOSIS,LEFT CIRC 60%-AV GROOVE, RGT COR 40% PROX ON THE BEND ,80% PROX PDA,    CORONARY ARTERY BYPASS GRAFT  08/16/2010   LIMA TO lad,svg to distal circ ,svg to posterior descending   DOPPLER ECHOCARDIOGRAPHY  08/09/2010   EF35% to 40% ,bbicuspid ;severely  thickened ,severely calcified leaflets, LV normal   LEA doppler  08/07/2010   left popliteal occlusion ,and anterior tibial complete occlusion   NM MYOCAR PERF WALL MOTION  08/06/2010   EF 31%  LOW RISK SCAN   nuc     PV ANGIOGRAM  08/08/2010   successful PTA LEFT POPLITEAL   Patient Active Problem List   Diagnosis Date Noted   Vertigo 11/26/2023   Blood in stool 11/26/2023   Osteoporosis 09/21/2023   Head injury 09/14/2023   Rib pain 09/14/2023   Restless leg syndrome 02/16/2023   Plantar fasciitis of right foot 06/16/2022   Caregiver stress syndrome 03/08/2019   Vitamin D  insufficiency 09/13/2018   White coat syndrome with diagnosis of hypertension 09/13/2018   Hyperlipidemia associated with type 2 diabetes mellitus (HCC) 02/03/2018   Hypothyroidism 02/03/2018   LBBB (left bundle branch block) 04/06/2014   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   Aortic stenosis 01/26/2013   S/P AVR 01/26/2013   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   Hypertension  associated with diabetes (HCC) 01/26/2013   Familial hyperlipidemia 01/26/2013   Cardiomyopathy, ischemic 01/26/2013   DVT (deep venous thrombosis) (HCC) 01/26/2013   Pulmonary embolus (HCC) 01/26/2013   Uncontrolled type 2 diabetes mellitus with hyperglycemia (HCC) 01/26/2013   Long term (current) use of anticoagulants 10/11/2012    PCP: Laneta Pintos., MD REFERRING PROVIDER: Lorane Rocker, PA-C  REFERRING DIAG:  Diagnosis  R42 (ICD-10-CM) - Vertigo    THERAPY DIAG:  Dizziness and giddiness  Difficulty in walking, not elsewhere classified  Unsteadiness on feet  ONSET DATE: 11-19-23  Rationale for Evaluation and Treatment: Rehabilitation  SUBJECTIVE:   SUBJECTIVE STATEMENT: Pt reports she is doing better than she was last week at  eval; says she has been doing the letter exercise (x1 viewing) and has been standing to do the exercise some of the time; pt states the exercise is helping  Pt accompanied by: self  PERTINENT HISTORY: Per chart note VICTORIYA POL is a 68 y.o. female with history of diabetes, CAD s/p three-vessel bypass, aortic valve stenosis with mechanical valve replacement, hyperlipidemia, hypertension, PE/DVT on Coumadin , left bundle branch block, hypothyroidism, who presents the emergency department with dizziness.  Per patient report, symptoms started waking her up from sleep late Thursday night, 5 days ago.  She was doing some work when she suddenly felt very lightheaded like she might lose consciousness.  She laid on the couch and her symptoms resolved and she went to bed as normal.  She woke up around 2 AM and felt like she was going to fall out of the bed and vomit.  She tried to walk to the bathroom and had to lay down in the hallway.  She then called 911.  She was brought to the ER and had evaluation, including cardiac workup.  She had a CT scan of her abdomen that was normal as well.  She was sent home after her symptoms were improving with medications given.  PAIN:  Are you having pain? Headache - started last night  PRECAUTIONS: Other: dizziness - fall risk  RED FLAGS: None   WEIGHT BEARING RESTRICTIONS: No  FALLS: Has patient fallen in last 6 months? Yes. Number of falls 1  LIVING ENVIRONMENT: Lives with: lives with their spouse Lives in: House/apartment  PLOF: Independent  PATIENT GOALS: resolve the dizziness - find out what I can do to make it better  OBJECTIVE:  Note: Objective measures were completed at Evaluation unless otherwise noted.  DIAGNOSTIC FINDINGS: IMPRESSION: CT scan Stable since February and normal for age noncontrast CT appearance of the Brain.  COGNITION: Overall cognitive status: Within functional limits for tasks assessed  POSTURE:  No Significant postural  limitations  Cervical ROM:  WNL's  BED MOBILITY:  Independent   TRANSFERS: Assistive device utilized: None  Sit to stand: Complete Independence  GAIT: Gait pattern: WFL Distance walked: 9' Assistive device utilized: None Level of assistance: Modified independence Comments: decreased speed due to dizziness; decreased head turns during amb. Due to dizziness  FUNCTIONAL TESTS:  Pt able to stand Romberg with EO for 30 secs; with EC for 17 secs with moderate postural sway   PATIENT SURVEYS:  DHI to be completed (gave to pt at eval)  VESTIBULAR ASSESSMENT:  GENERAL OBSERVATION: pt is a 68 yr old lady with onset of dizziness that started 6 weeks ago; pt is unable to turn her head without experiencing moderate to severe dizziness   SYMPTOM BEHAVIOR:  Subjective history: pt reports onset of dizziness 6  weeks ago - had severe vomiting - called 911; MRI and CT scan negative for MI and CVA; pt evaluated by ENT but no etiology of dizziness determined - possibly vestibular neuritis; migraine ruled out.  Pt states she is much improved from initial onset. Is now driving but minimizes head movements  Non-Vestibular symptoms: changes in vision, headaches, tinnitus, nausea/vomiting, migraine symptoms, and tinnitus has gotten louder since dizziness onset; had a migraine on Sunday   Type of dizziness: Blurred Vision, Imbalance (Disequilibrium), Unsteady with head/body turns, Lightheadedness/Faint, World moves, and needs to move slowly  Frequency: mostly constant  Duration: mostly constant   Aggravating factors: Induced by motion: occur when walking, bending down to the ground, turning body quickly, turning head quickly, and sitting in a moving car, Worse with fatigue, Worse in the dark, and worse in grocery store  Relieving factors: head stationary, lying supine, rest, slow movements, and avoid busy/distracting environments  Progression of symptoms: better  OCULOMOTOR EXAM:  Ocular Alignment:  normal  Ocular ROM: No Limitations  Spontaneous Nystagmus: absent  Gaze-Induced Nystagmus: absent  Smooth Pursuits: mild nystagmus noted with horizontal smooth pursuits - c/o moderate dizziness with this testing  Saccades: intact     VESTIBULAR - OCULAR REFLEX:   Slow VOR: Comment: not tested due to c/o dizziness  VOR Cancellation: Comment: c/o dizziness  Head-Impulse Test: HIT Right: positive HIT Left: positive unable to maintain gaze on PT's nose with Rt and Lt HIT  Dynamic Visual Acuity: Not able to be assessed   POSITIONAL TESTING: NT based on pt's symptomology   MOTION SENSITIVITY:  Motion Sensitivity Quotient Intensity: 0 = none, 1 = Lightheaded, 2 = Mild, 3 = Moderate, 4 = Severe, 5 = Vomiting  Intensity  1. Sitting to supine   2. Supine to L side   3. Supine to R side   4. Supine to sitting   5. L Hallpike-Dix   6. Up from L    7. R Hallpike-Dix   8. Up from R    9. Sitting, head tipped to L knee 0  10. Head up from L knee 3  11. Sitting, head tipped to R knee 0  12. Head up from R knee 3  13. Sitting head turns x5 4  14.Sitting head nods x5 4  15. In stance, 180 turn to L    16. In stance, 180 turn to R                                                                                                                               TREATMENT DATE: 01-05-24  Gait: Functional Gait Assessment    01/05/24 0001  Functional Gait  Assessment  Gait assessed  Yes  Gait Level Surface 2 (6.57)  Change in Gait Speed 2  Gait with Horizontal Head Turns 1  Gait with Vertical Head Turns 1  Gait and Pivot Turn 2  Step Over Obstacle 3  Gait  with Narrow Base of Support 1 (7 steps)  Gait with Eyes Closed 1  Ambulating Backwards 2  Steps 3  Total Score 18   NeuroRe-ed: Calculated DHI score = 48% (moderate handicap category 36-52)   mCTSIB; condition 1 - 30 secs:   condition 2 - 30 secs with mild postural sway;  condition 3 - 30 secs; condition 4 - 30 secs with mild  to moderate postural sway but no complete LOB; total score 120 secs  Standing Balance: Surface: Airex Position: Narrow Base of Support Feet Hip Width Apart Completed with: Eyes Open and Eyes Closed; Head Turns x 5 Reps and Head Nods x 5 Reps  Issued these exercises for HEP   [employer]:  Access Code: [email or web address] URL: [email or web address] Date: 01/08/2024 Prepared by: Johnnette Nakayama  Exercises - Standing on foam pad - Eyes open and then with Eyes closed   - 1 x daily - 7 x weekly - 1 sets - 2 reps - Romberg Stance on Foam Pad  - 1 x daily - 7 x weekly - 1 sets - 2 reps - Romberg Stance Eyes Closed on Foam Pad  - 1 x daily - 7 x weekly - 1 sets - 2 reps   Pt performed visual tracking of ball CW direction 5 reps, then CCW direction 5 reps - standing on floor with feet shoulder width apart; pt reported moderate provocation of dizziness with this exercise   PATIENT EDUCATION: Education details: balance on foam added to HEP - see [employer] HEP above-  educated pt in DHI score (48%) - moderate handicap category Person educated: Patient Education method: Explanation, Demonstration, and Handouts Education comprehension: verbalized understanding and needs further education  HOME EXERCISE PROGRAM: x1 viewing exercise  GOALS: Goals reviewed with patient? Yes  SHORT TERM GOALS: Target date: 01-29-24  Pt will perform x1 viewing exercise in standing position for 60 secs both horizontal and vertical directions with c/o dizziness </= 4/10 intensity to improve gaze stabilization. Baseline: Goal status: INITIAL  2.  Assess DVA and establish LTG. Baseline:  Goal status: INITIAL  3.  Improve DHI score by at least 14 points to demo improvement in dizziness. Baseline: to be completed; score 48% on 01-05-24 Goal status: INITIAL  4.  Assess mCTSIB and establish LTG. Baseline:  Goal status: INITIAL  5.  Amb. 30' with horizontal head turns with minimal postural instability with c/o  dizziness </= 5/10 intensity. Baseline:  Goal status: INITIAL  6.  Independent in HEP for balance and vestibular exercises. Baseline:  Goal status: INITIAL  LONG TERM GOALS: Target date: 02-26-23  Improve DVA to </= 3 line difference for improved gaze stabilization. Baseline:  Goal status: INITIAL  2.  Improve DHI score by at least 50% of initial score. Baseline: 48% (01-05-24) Goal status: INITIAL  3.  Pt will amb. 29' with horizontal head turns without LOB with c/o dizziness </= 3/10.  Baseline:  Goal status: INITIAL  4.  Improve FGA score by at least 8 points to demo improved balance and reduced fall risk with ambulation. Baseline:  Goal status: INITIAL  5.  Demonstrate increased vestibular input by standing on compliant surface with EC for 30 secs without LOB. Baseline:  Goal status: INITIAL  6.  Independent in updated HEP for balance and vestibular exercises. Baseline:  Goal status: INITIAL  7. Report at least 50% improvement in headaches for increased ease with ADL's and mobility and caregiving tasks.  Baseline: c/o moderate headaches daily           Goal status:  INITIAL  ASSESSMENT:  CLINICAL IMPRESSION: PT session focused on further assessment of vestibular system input in maintaining balance with pt scoring 18/30 on FGA (indicative of fall risk).  Pt able to maintain balance for 30 secs on all 4 conditions of mCTSIB but did have mild to moderate postural sway on condition 4 but no LOB.  Pt's DHI score = 48%, moderate handicap category.  Pt reported moderate dizziness provoked with exercise of tracking moving ball in CW and CCW directions.  Pt is much improved in today's session compared to that in previous session (in initial eval on 12-31-23).  Cont with POC.   OBJECTIVE IMPAIRMENTS: decreased balance, difficulty walking, dizziness, and headaches.   ACTIVITY LIMITATIONS: bending, squatting, transfers, bathing, locomotion level, and caring for  others  PARTICIPATION LIMITATIONS: meal prep, cleaning, laundry, driving, shopping, community activity, and yard work  PERSONAL FACTORS: 1 comorbidity: h/o mechanical valve replacement and dizziness of unknown etiology  are also affecting patient's functional outcome.   REHAB POTENTIAL: Good  CLINICAL DECISION MAKING: Evolving/moderate complexity  EVALUATION COMPLEXITY: Moderate   PLAN:  PT FREQUENCY: 1-2x/week  PT DURATION: 8 weeks + eval  PLANNED INTERVENTIONS: 97110-Therapeutic exercises, 97530- Therapeutic activity, [id number]- Neuromuscular re-education, [phone]- Self Care, 30865- Gait training, [phone]- Aquatic Therapy, Patient/Family education, and Vestibular training  PLAN FOR NEXT SESSION: continue dynamic balance and gait with increased vestibular input incorporated - tracking ball, x1 viewing, step and pivot turn 180 degrees   Amond Speranza, Celeste Cola, PT 01/08/2024, 12:01 PM

## 2024-01-06 ENCOUNTER — Ambulatory Visit: Admitting: Family Medicine

## 2024-01-06 ENCOUNTER — Telehealth: Payer: Self-pay | Admitting: *Deleted

## 2024-01-06 NOTE — Telephone Encounter (Signed)
 LVM informing pt we cancelled the appointment and also sent a mychart message.

## 2024-01-08 NOTE — Progress Notes (Signed)
   01/05/24 0001  Functional Gait  Assessment  Gait assessed  Yes  Gait Level Surface 2 (6.57)  Change in Gait Speed 2  Gait with Horizontal Head Turns 1  Gait with Vertical Head Turns 1  Gait and Pivot Turn 2  Step Over Obstacle 3  Gait with Narrow Base of Support 1 (7 steps)  Gait with Eyes Closed 1  Ambulating Backwards 2  Steps 3  Total Score 18

## 2024-01-14 ENCOUNTER — Ambulatory Visit (HOSPITAL_COMMUNITY)
Admission: RE | Admit: 2024-01-14 | Discharge: 2024-01-14 | Disposition: A | Discharge status: Home / Self Care | Source: Ambulatory Visit | Attending: Cardiology | Admitting: Cardiology

## 2024-01-14 DIAGNOSIS — I152 Hypertension secondary to endocrine disorders: Secondary | ICD-10-CM

## 2024-01-14 DIAGNOSIS — E1159 Type 2 diabetes mellitus with other circulatory complications: Secondary | ICD-10-CM

## 2024-01-14 LAB — ECHOCARDIOGRAM COMPLETE
AR max vel: 0.97 cm2
AV Area VTI: 1.15 cm2
AV Area mean vel: 1.04 cm2
AV Mean grad: 16 mmHg
AV Peak grad: 26.2 mmHg
Ao pk vel: 2.56 m/s
Area-P 1/2: 4.93 cm2
S' Lateral: 3 cm

## 2024-01-15 ENCOUNTER — Ambulatory Visit (HOSPITAL_BASED_OUTPATIENT_CLINIC_OR_DEPARTMENT_OTHER): Payer: Self-pay | Admitting: Internal Medicine

## 2024-01-19 ENCOUNTER — Ambulatory Visit: Payer: Self-pay | Admitting: Physical Therapy

## 2024-01-19 VITALS — BP 137/80 | HR 78

## 2024-01-19 DIAGNOSIS — R42 Dizziness and giddiness: Secondary | ICD-10-CM

## 2024-01-19 DIAGNOSIS — R2681 Unsteadiness on feet: Secondary | ICD-10-CM

## 2024-01-19 DIAGNOSIS — R262 Difficulty in walking, not elsewhere classified: Secondary | ICD-10-CM

## 2024-01-19 NOTE — Therapy (Unsigned)
 OUTPATIENT PHYSICAL THERAPY VESTIBULAR TREATMENT NOTE     Patient Name: Katrina Obrien MRN: [id number] DOB:October 19, 1955, 68 y.o., female Today's Date: 01/20/2024  END OF SESSION:  PT End of Session - 01/20/24 1952     Visit Number 3    Number of Visits 17    Date for PT Re-Evaluation 02/26/24    Authorization Type [employer]    Authorization Time Period 12-31-23 - 03-04-24    Authorization - Visit Number 3    Authorization - Number of Visits 17    PT Start Time 1147    PT Stop Time 1233    PT Time Calculation (min) 46 min    Equipment Utilized During Treatment Gait belt    Activity Tolerance Patient tolerated treatment well   limited by dizziness   Behavior During Therapy WFL for tasks assessed/performed            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   Clotting disorder (HCC)    anticoagulant - coumadin  hx DVT, PE   Coronary artery disease    s/p 3- vessel bypass    Diabetes mellitus without complication (HCC)    DVT (deep venous thrombosis) (HCC)    Heart murmur    Hyperlipidemia    Hypertension    Ischemic cardiomyopathy    40 to 45%   Left bundle branch block    Mechanical heart valve present    Jan 2012   Popliteal artery occlusion, left (HCC)    recent  status  post angioplasty   Pulmonary embolism (HCC)    associated with DVT   Pulmonary embolus (HCC)    Thyroid  disease    hyperthyroidism   Past Surgical History:  Procedure Laterality Date   AORTIC VALVE REPLACEMENT (AVR)/CORONARY ARTERY BYPASS GRAFTING (CABG)  08/16/2010  VAN TRIGT   AV replacement w/19-mm mechanical St Jude valve ([id number]),CABGX3  lima to LAD, SVG to distal circ,SVG to posterior descending   ASD REPAIR     CARDIAC CATHETERIZATION  08/08/2010   ,LAD 50% PROX,605 TANDEM SEGMENTAL STENOSIS,LEFT CIRC 60%-AV GROOVE, RGT COR 40% PROX ON THE BEND ,80% PROX  PDA,   CORONARY ARTERY BYPASS GRAFT  08/16/2010   LIMA TO lad,svg to distal circ ,svg to posterior descending   DOPPLER ECHOCARDIOGRAPHY  08/09/2010   EF35% to 40% ,bbicuspid ;severely  thickened ,severely calcified leaflets, LV normal   LEA doppler  08/07/2010   left popliteal occlusion ,and anterior tibial complete occlusion   NM MYOCAR PERF WALL MOTION  08/06/2010   EF 31%  LOW RISK SCAN   nuc     PV ANGIOGRAM  08/08/2010   successful PTA LEFT POPLITEAL   Patient Active Problem List   Diagnosis Date Noted   Vertigo 11/26/2023   Blood in stool 11/26/2023   Osteoporosis 09/21/2023   Head injury 09/14/2023   Rib pain 09/14/2023   Restless leg syndrome 02/16/2023   Plantar fasciitis of right foot 06/16/2022   Caregiver stress syndrome 03/08/2019   Vitamin D  insufficiency 09/13/2018   White coat syndrome with diagnosis of hypertension 09/13/2018   Hyperlipidemia associated with type 2 diabetes mellitus (HCC) 02/03/2018   Hypothyroidism 02/03/2018   LBBB (left bundle branch block) 04/06/2014   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   Aortic stenosis 01/26/2013   S/P AVR 01/26/2013   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  Hypertension associated with diabetes (HCC) 01/26/2013   Familial hyperlipidemia 01/26/2013   Cardiomyopathy, ischemic 01/26/2013   DVT (deep venous thrombosis) (HCC) 01/26/2013   Pulmonary embolus (HCC) 01/26/2013   Uncontrolled type 2 diabetes mellitus with hyperglycemia (HCC) 01/26/2013   Long term (current) use of anticoagulants 10/11/2012    PCP: Chandra Toribio POUR., MD REFERRING PROVIDER: Palmer Purchase, PA-C  REFERRING DIAG:  Diagnosis  R42 (ICD-10-CM) - Vertigo    THERAPY DIAG:  Dizziness and giddiness  Difficulty in walking, not elsewhere classified  Unsteadiness on feet  ONSET DATE: 11-19-23  Rationale for Evaluation and Treatment: Rehabilitation  SUBJECTIVE:   SUBJECTIVE STATEMENT: Pt reports she continues to improve; [place] and bent down to do something with dog bowl on floor and got extremely dizzy when she stood back up - states her granddaughter grabbed her arm and assisted her to sitting position, preventing her from falling down.  Realizes now she must be more cautious and move slowly Pt accompanied by: self  PERTINENT HISTORY: Per chart note Katrina Obrien is a 68 y.o. female with history of diabetes, CAD s/p three-vessel bypass, aortic valve stenosis with mechanical valve replacement, hyperlipidemia, hypertension, PE/DVT on Coumadin , left bundle branch block, hypothyroidism, who presents the emergency department with dizziness.  Per patient report, symptoms started waking her up from sleep late Thursday night, 5 days ago.  She was doing some work when she suddenly felt very lightheaded like she might lose consciousness.  She laid on the couch and her symptoms resolved and she went to bed as normal.  She woke up around 2 AM and felt like she was going to fall out of the bed and vomit.  She tried to walk to the bathroom and had to lay down in the hallway.  She then called 911.  She was brought to the ER and had evaluation, including cardiac workup.  She had a CT scan of her abdomen that was normal as well.  She was sent home after her symptoms were improving with medications given.  PAIN:  Are you having pain? Headache - started last night  PRECAUTIONS: Other: dizziness - fall risk  RED FLAGS: None   WEIGHT BEARING RESTRICTIONS: No  FALLS: Has patient fallen in last 6 months? Yes. Number of falls 1  LIVING ENVIRONMENT: Lives with: lives with their spouse Lives in: House/apartment  PLOF: Independent  PATIENT GOALS: resolve the dizziness - find out what I can do to make it better  OBJECTIVE:  Note: Objective measures were completed at Evaluation unless otherwise noted.  DIAGNOSTIC FINDINGS: IMPRESSION: CT scan Stable since February and normal for age noncontrast CT appearance of the  Brain.  COGNITION: Overall cognitive status: Within functional limits for tasks assessed  POSTURE:  No Significant postural limitations  Cervical ROM:  WNL's  BED MOBILITY:  Independent   TRANSFERS: Assistive device utilized: None  Sit to stand: Complete Independence  GAIT: Gait pattern: WFL Distance walked: 15' Assistive device utilized: None Level of assistance: Modified independence Comments: decreased speed due to dizziness; decreased head turns during amb. Due to dizziness  FUNCTIONAL TESTS:  Pt able to stand Romberg with EO for 30 secs; with EC for 17 secs with moderate postural sway   PATIENT SURVEYS:  DHI to be completed (gave to pt at eval)  VESTIBULAR ASSESSMENT:  GENERAL OBSERVATION: pt is a 68 yr old lady with onset of dizziness that started 6 weeks ago; pt is unable to turn her  head without experiencing moderate to severe dizziness   SYMPTOM BEHAVIOR:  Subjective history: pt reports onset of dizziness 6 weeks ago - had severe vomiting - called 911; MRI and CT scan negative for MI and CVA; pt evaluated by ENT but no etiology of dizziness determined - possibly vestibular neuritis; migraine ruled out.  Pt states she is much improved from initial onset. Is now driving but minimizes head movements  Non-Vestibular symptoms: changes in vision, headaches, tinnitus, nausea/vomiting, migraine symptoms, and tinnitus has gotten louder since dizziness onset; had a migraine on Sunday   Type of dizziness: Blurred Vision, Imbalance (Disequilibrium), Unsteady with head/body turns, Lightheadedness/Faint, World moves, and needs to move slowly  Frequency: mostly constant  Duration: mostly constant   Aggravating factors: Induced by motion: occur when walking, bending down to the ground, turning body quickly, turning head quickly, and sitting in a moving car, Worse with fatigue, Worse in the dark, and worse in grocery store  Relieving factors: head stationary, lying supine, rest,  slow movements, and avoid busy/distracting environments  Progression of symptoms: better  OCULOMOTOR EXAM:  Ocular Alignment: normal  Ocular ROM: No Limitations  Spontaneous Nystagmus: absent  Gaze-Induced Nystagmus: absent  Smooth Pursuits: mild nystagmus noted with horizontal smooth pursuits - c/o moderate dizziness with this testing  Saccades: intact     VESTIBULAR - OCULAR REFLEX:   Slow VOR: Comment: not tested due to c/o dizziness  VOR Cancellation: Comment: c/o dizziness  Head-Impulse Test: HIT Right: positive HIT Left: positive unable to maintain gaze on PT's nose with Rt and Lt HIT  Dynamic Visual Acuity: Not able to be assessed   POSITIONAL TESTING: NT based on pt's symptomology   MOTION SENSITIVITY:  Motion Sensitivity Quotient Intensity: 0 = none, 1 = Lightheaded, 2 = Mild, 3 = Moderate, 4 = Severe, 5 = Vomiting  Intensity  1. Sitting to supine   2. Supine to L side   3. Supine to R side   4. Supine to sitting   5. L Hallpike-Dix   6. Up from L    7. R Hallpike-Dix   8. Up from R    9. Sitting, head tipped to L knee 0  10. Head up from L knee 3  11. Sitting, head tipped to R knee 0  12. Head up from R knee 3  13. Sitting head turns x5 4  14.Sitting head nods x5 4  15. In stance, 180 turn to L    16. In stance, 180 turn to R                                                                                                                               TREATMENT DATE: 01-19-24  Gait:  pt amb. Without device - practiced amb. With horizontal head turns 25' x 2 reps; vertical head turns 25' x 2 reps Amb. Tossing catching ball straight up - approx. 40' x 2 reps  NeuroRe-ed:  SVA line 8:  DVA line 6 (WNL's) but pt reported dizziness upon completion of test  Standing Balance: Surface: Airex Position: Narrow Base of Support Feet Hip Width Apart Completed with: Eyes Open and Eyes Closed; Head Turns x 5 Reps and Head Nods x 5 Reps  Marching on foam with EO -  no head turns;  marching with EC - no head turns with CGA to min assist Marching with EO with horizontal head turns 5 reps, then with vertical head turns 5 reps  Standing on Airex - made circles with ball CW 5 reps:  then CCW 5 reps    TherAct:   Habituation activity - used 8 cones - pt seated - bent down to pick up each cone off floor and then hand to PT above, in various locations Progressed to standing position - used 5 cones - pt bent down to retrieve each cone - reported moderate dizziness with this activity  Added marching on FLOOR with EO and EC and with head turns to HEP - no handout given as pt stated not needed      Issued these exercises for HEP   Medbridge:  Access Code: [facility] URL: [email or web address] Date: 01/08/2024 Prepared by: Rock Kussmaul  Exercises - Standing on foam pad - Eyes open and then with Eyes closed   - 1 x daily - 7 x weekly - 1 sets - 2 reps - Romberg Stance on Foam Pad  - 1 x daily - 7 x weekly - 1 sets - 2 reps - Romberg Stance Eyes Closed on Foam Pad  - 1 x daily - 7 x weekly - 1 sets - 2 reps   Pt performed visual tracking of ball CW direction 5 reps, then CCW direction 5 reps - standing on floor with feet shoulder width apart; pt reported moderate provocation of dizziness with this exercise   PATIENT EDUCATION: Education details: balance on foam added to HEP - see Medbridge HEP above-  educated pt in DHI score (48%) - moderate handicap category Person educated: Patient Education method: Explanation, Demonstration, and Handouts Education comprehension: verbalized understanding and needs further education  HOME EXERCISE PROGRAM: x1 viewing exercise  GOALS: Goals reviewed with patient? Yes  SHORT TERM GOALS: Target date: 01-29-24  Pt will perform x1 viewing exercise in standing position for 60 secs both horizontal and vertical directions with c/o dizziness </= 4/10 intensity to improve gaze stabilization. Baseline: Goal  status: INITIAL  2.  Assess DVA and establish LTG. Baseline:  Goal status: INITIAL  3.  Improve DHI score by at least 14 points to demo improvement in dizziness. Baseline: to be completed; score 48% on 01-05-24 Goal status: INITIAL  4.  Assess mCTSIB and establish LTG. (Maintain balance on condition 4 for 30 secs to demo increased vestibular input in maintaining balance - 01-05-24) Baseline:  Goal status: INITIAL  5.  Amb. 30' with horizontal head turns with minimal postural instability with c/o dizziness </= 5/10 intensity. Baseline:  Goal status: INITIAL  6.  Independent in HEP for balance and vestibular exercises. Baseline:  Goal status: INITIAL  LONG TERM GOALS: Target date: 02-26-23  Improve DVA to </= 3 line difference for improved gaze stabilization.; Revised -- </= 2 line difference with no c/o dizziness upon test completion Baseline:  Goal status: INITIAL  2.  Improve DHI score by at least 50% of initial score. (24%) Baseline: 48% (01-05-24) Goal status: INITIAL  3.  Pt will amb. 81' with horizontal head turns without  LOB with c/o dizziness </= 3/10.  Baseline:  Goal status: INITIAL  4.  Improve FGA score by at least 8 points to demo improved balance and reduced fall risk with ambulation. Baseline:  Goal status: INITIAL  5.  Demonstrate increased vestibular input by standing on compliant surface with EC for 30 secs without LOB. Baseline:  Goal status: INITIAL  6.  Independent in updated HEP for balance and vestibular exercises. Baseline:  Goal status: INITIAL  7. Report at least 50% improvement in headaches for increased ease with ADL's and mobility and caregiving tasks.            Baseline: c/o moderate headaches daily           Goal status:  INITIAL  ASSESSMENT:  CLINICAL IMPRESSION: PT session focused on balance and gait activities with increased vestibular input incorporated.  Pt reported moderate dizziness with standing on Airex tracking ball in both CW  and CCW directions.  Pt reported moderate to severe dizziness with bending over to pick up 5 cones from standing position (> c/o dizziness from standing position than from seated position with this activity).  Pt's DVA is WNL's with a 2 line difference, however, pt reported dizziness upon completion of test.  Cont with POC.   OBJECTIVE IMPAIRMENTS: decreased balance, difficulty walking, dizziness, and headaches.   ACTIVITY LIMITATIONS: bending, squatting, transfers, bathing, locomotion level, and caring for others  PARTICIPATION LIMITATIONS: meal prep, cleaning, laundry, driving, shopping, community activity, and yard work  PERSONAL FACTORS: 1 comorbidity: h/o mechanical valve replacement and dizziness of unknown etiology  are also affecting patient's functional outcome.   REHAB POTENTIAL: Good  CLINICAL DECISION MAKING: Evolving/moderate complexity  EVALUATION COMPLEXITY: Moderate   PLAN:  PT FREQUENCY: 1-2x/week  PT DURATION: 8 weeks + eval  PLANNED INTERVENTIONS: 97110-Therapeutic exercises, 97530- Therapeutic activity, [id number]- Neuromuscular re-education, [phone]- Self Care, [id number]- Gait training, [phone]- Aquatic Therapy, Patient/Family education, and Vestibular training  PLAN FOR NEXT SESSION: continue dynamic balance and gait with increased vestibular input incorporated - tracking ball, x1 viewing, step and pivot turn 180 degrees   Filbert Craze, [place], PT 01/20/2024, 7:54 PM

## 2024-01-19 NOTE — Patient Instructions (Signed)
 FUNCTIONAL MOBILITY: Marching - Standing    March in place by lifting left leg up, then right. Alternate. __10_ reps per set, _1_ sets per day, _7_ days per week Hold onto a support - or DO IN CORNER  EYES CLOSED - add head turns side to side and then up/down

## 2024-01-20 ENCOUNTER — Encounter: Payer: Self-pay | Admitting: Physical Therapy

## 2024-01-21 ENCOUNTER — Ambulatory Visit: Payer: Self-pay | Admitting: Physical Therapy

## 2024-01-21 DIAGNOSIS — R2681 Unsteadiness on feet: Secondary | ICD-10-CM | POA: Diagnosis not present

## 2024-01-21 DIAGNOSIS — R262 Difficulty in walking, not elsewhere classified: Secondary | ICD-10-CM | POA: Diagnosis not present

## 2024-01-21 DIAGNOSIS — R42 Dizziness and giddiness: Secondary | ICD-10-CM

## 2024-01-21 NOTE — Therapy (Signed)
 OUTPATIENT PHYSICAL THERAPY VESTIBULAR TREATMENT NOTE     Patient Name: Katrina Obrien MRN: [id number] DOB:02-May-1956, 68 y.o., female Today's Date: 01/22/2024  END OF SESSION:  PT End of Session - 01/22/24 1731     Visit Number 4    Number of Visits 17    Date for PT Re-Evaluation 02/26/24    Authorization Type [employer]    Authorization Time Period 12-31-23 - 03-04-24    Authorization - Visit Number 4    Authorization - Number of Visits 17    PT Start Time 1102    PT Stop Time 1146    PT Time Calculation (min) 44 min    Equipment Utilized During Treatment Gait belt    Activity Tolerance Patient tolerated treatment well    Behavior During Therapy WFL for tasks assessed/performed             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   Clotting disorder (HCC)    anticoagulant - coumadin  hx DVT, PE   Coronary artery disease    s/p 3- vessel bypass    Diabetes mellitus without complication (HCC)    DVT (deep venous thrombosis) (HCC)    Heart murmur    Hyperlipidemia    Hypertension    Ischemic cardiomyopathy    40 to 45%   Left bundle branch block    Mechanical heart valve present    Jan 2012   Popliteal artery occlusion, left (HCC)    recent  status  post angioplasty   Pulmonary embolism (HCC)    associated with DVT   Pulmonary embolus (HCC)    Thyroid  disease    hyperthyroidism   Past Surgical History:  Procedure Laterality Date   AORTIC VALVE REPLACEMENT (AVR)/CORONARY ARTERY BYPASS GRAFTING (CABG)  08/16/2010  VAN TRIGT   AV replacement w/19-mm mechanical St Jude valve ([id number]),CABGX3  lima to LAD, SVG to distal circ,SVG to posterior descending   ASD REPAIR     CARDIAC CATHETERIZATION  08/08/2010   ,LAD 50% PROX,605 TANDEM SEGMENTAL STENOSIS,LEFT CIRC 60%-AV GROOVE, RGT COR 40% PROX ON THE BEND ,80% PROX PDA,   CORONARY ARTERY  BYPASS GRAFT  08/16/2010   LIMA TO lad,svg to distal circ ,svg to posterior descending   DOPPLER ECHOCARDIOGRAPHY  08/09/2010   EF35% to 40% ,bbicuspid ;severely  thickened ,severely calcified leaflets, LV normal   LEA doppler  08/07/2010   left popliteal occlusion ,and anterior tibial complete occlusion   NM MYOCAR PERF WALL MOTION  08/06/2010   EF 31%  LOW RISK SCAN   nuc     PV ANGIOGRAM  08/08/2010   successful PTA LEFT POPLITEAL   Patient Active Problem List   Diagnosis Date Noted   Headache 01/22/2024   Vertigo 11/26/2023   Blood in stool 11/26/2023   Osteoporosis 09/21/2023   Head injury 09/14/2023   Rib pain 09/14/2023   Restless leg syndrome 02/16/2023   Plantar fasciitis of right foot 06/16/2022   Caregiver stress syndrome 03/08/2019   Vitamin D  insufficiency 09/13/2018   White coat syndrome with diagnosis of hypertension 09/13/2018   Hyperlipidemia associated with type 2 diabetes mellitus (HCC) 02/03/2018   Hypothyroidism 02/03/2018   LBBB (left bundle branch block) 04/06/2014   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   Aortic stenosis 01/26/2013   S/P AVR 01/26/2013   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  Hypertension associated with diabetes (HCC) 01/26/2013   Familial hyperlipidemia 01/26/2013   Cardiomyopathy, ischemic 01/26/2013   DVT (deep venous thrombosis) (HCC) 01/26/2013   Pulmonary embolus (HCC) 01/26/2013   Uncontrolled type 2 diabetes mellitus with hyperglycemia (HCC) 01/26/2013   Long term (current) use of anticoagulants 10/11/2012    PCP: Chandra Toribio POUR., MD REFERRING PROVIDER: Palmer Purchase, PA-C  REFERRING DIAG:  Diagnosis  R42 (ICD-10-CM) - Vertigo    THERAPY DIAG:  Dizziness and giddiness  Difficulty in walking, not elsewhere classified  Unsteadiness on feet  ONSET DATE: 11-19-23  Rationale for Evaluation and Treatment: Rehabilitation  SUBJECTIVE:   SUBJECTIVE STATEMENT: Pt reports she had a rough day yesterday - walked a  little too far in the heat and didn't feel well when she got back home; has been more unsteady and has had more dizziness since this episode; balance is not as good today as it was on Tuesday this week.  Has appt tomorrow with her PCP about the headaches she continues to have. Pt accompanied by: self  PERTINENT HISTORY: Per chart note Katrina Obrien is a 68 y.o. female with history of diabetes, CAD s/p three-vessel bypass, aortic valve stenosis with mechanical valve replacement, hyperlipidemia, hypertension, PE/DVT on Coumadin , left bundle branch block, hypothyroidism, who presents the emergency department with dizziness.  Per patient report, symptoms started waking her up from sleep late Thursday night, 5 days ago.  She was doing some work when she suddenly felt very lightheaded like she might lose consciousness.  She laid on the couch and her symptoms resolved and she went to bed as normal.  She woke up around 2 AM and felt like she was going to fall out of the bed and vomit.  She tried to walk to the bathroom and had to lay down in the hallway.  She then called 911.  She was brought to the ER and had evaluation, including cardiac workup.  She had a CT scan of her abdomen that was normal as well.  She was sent home after her symptoms were improving with medications given.  PAIN:  Are you having pain? Headache - started last night  PRECAUTIONS: Other: dizziness - fall risk  RED FLAGS: None   WEIGHT BEARING RESTRICTIONS: No  FALLS: Has patient fallen in last 6 months? Yes. Number of falls 1  LIVING ENVIRONMENT: Lives with: lives with their spouse Lives in: House/apartment  PLOF: Independent  PATIENT GOALS: resolve the dizziness - find out what I can do to make it better  OBJECTIVE:  Note: Objective measures were completed at Evaluation unless otherwise noted.  DIAGNOSTIC FINDINGS: IMPRESSION: CT scan Stable since February and normal for age noncontrast CT appearance of the  Brain.  COGNITION: Overall cognitive status: Within functional limits for tasks assessed  POSTURE:  No Significant postural limitations  Cervical ROM:  WNL's  BED MOBILITY:  Independent   TRANSFERS: Assistive device utilized: None  Sit to stand: Complete Independence  GAIT: Gait pattern: WFL Distance walked: 82' Assistive device utilized: None Level of assistance: Modified independence Comments: decreased speed due to dizziness; decreased head turns during amb. Due to dizziness  FUNCTIONAL TESTS:  Pt able to stand Romberg with EO for 30 secs; with EC for 17 secs with moderate postural sway   PATIENT SURVEYS:  DHI to be completed (gave to pt at eval)  VESTIBULAR ASSESSMENT:  GENERAL OBSERVATION: pt is a 68 yr old lady with onset of dizziness that started 6 weeks ago; pt is  unable to turn her head without experiencing moderate to severe dizziness   SYMPTOM BEHAVIOR:  Subjective history: pt reports onset of dizziness 6 weeks ago - had severe vomiting - called 911; MRI and CT scan negative for MI and CVA; pt evaluated by ENT but no etiology of dizziness determined - possibly vestibular neuritis; migraine ruled out.  Pt states she is much improved from initial onset. Is now driving but minimizes head movements  Non-Vestibular symptoms: changes in vision, headaches, tinnitus, nausea/vomiting, migraine symptoms, and tinnitus has gotten louder since dizziness onset; had a migraine on Sunday   Type of dizziness: Blurred Vision, Imbalance (Disequilibrium), Unsteady with head/body turns, Lightheadedness/Faint, World moves, and needs to move slowly  Frequency: mostly constant  Duration: mostly constant   Aggravating factors: Induced by motion: occur when walking, bending down to the ground, turning body quickly, turning head quickly, and sitting in a moving car, Worse with fatigue, Worse in the dark, and worse in grocery store  Relieving factors: head stationary, lying supine, rest,  slow movements, and avoid busy/distracting environments  Progression of symptoms: better  OCULOMOTOR EXAM:  Ocular Alignment: normal  Ocular ROM: No Limitations  Spontaneous Nystagmus: absent  Gaze-Induced Nystagmus: absent  Smooth Pursuits: mild nystagmus noted with horizontal smooth pursuits - c/o moderate dizziness with this testing  Saccades: intact     VESTIBULAR - OCULAR REFLEX:   Slow VOR: Comment: not tested due to c/o dizziness  VOR Cancellation: Comment: c/o dizziness  Head-Impulse Test: HIT Right: positive HIT Left: positive unable to maintain gaze on PT's nose with Rt and Lt HIT  Dynamic Visual Acuity: Not able to be assessed   POSITIONAL TESTING: NT based on pt's symptomology   MOTION SENSITIVITY:  Motion Sensitivity Quotient Intensity: 0 = none, 1 = Lightheaded, 2 = Mild, 3 = Moderate, 4 = Severe, 5 = Vomiting  Intensity  1. Sitting to supine   2. Supine to L side   3. Supine to R side   4. Supine to sitting   5. L Hallpike-Dix   6. Up from L    7. R Hallpike-Dix   8. Up from R    9. Sitting, head tipped to L knee 0  10. Head up from L knee 3  11. Sitting, head tipped to R knee 0  12. Head up from R knee 3  13. Sitting head turns x5 4  14.Sitting head nods x5 4  15. In stance, 180 turn to L    16. In stance, 180 turn to R                                                                                                                               TREATMENT DATE: 01-21-24  Gait:  pt amb. Without device - amb. With horizontal heads 30' x 1 rep;  with vertical head turns 30' x 1 rep with CGA  NeuroRe-ed:  Amb. Tossing catching ball straight  up - approx. 25' x 2 reps:  progressed to tossing/catching ball on Rt/Lt side for increased eye/head movement during gait - 25' x 2 reps  Standing Balance: Surface: Airex Position: Narrow Base of Support Feet Hip Width Apart Completed with: Eyes Open and Eyes Closed; Head Turns x 5 Reps and Head Nods x 5  Reps  Marching on foam with EO - no head turns, 10 reps:  marching with EO with head turns 5 reps side to side Marching with EC on Airex - no head turns  Standing on Airex - pt performed visual tracking of letter A moved in various directions by PT-- pt c/o dizziness with this exercise and requested seated rest period (short)   TherAct:   Habituation activity - used 4 cones - placed cones on floor - pt picked up individual cone, turned 180 degrees and walked 10' forward to place cone back down on floor - performed this activity 2 reps;  reported moderate dizziness upon completion of this exercise  Sit to stand from mat with EC - 5 reps - no UE support used  Pt performed rockerboard anterior/posteriorly and then laterally inside // bars - 10 reps with EO with UE support prn - each direction;  10 reps each direction with EC - with minimal UE support on // bars for assist with balance   Issued these exercises for HEP   Medbridge:  Access Code: [facility] URL: [email or web address] Date: 01/08/2024 Prepared by: Rock Kussmaul  Exercises - Standing on foam pad - Eyes open and then with Eyes closed   - 1 x daily - 7 x weekly - 1 sets - 2 reps - Romberg Stance on Foam Pad  - 1 x daily - 7 x weekly - 1 sets - 2 reps - Romberg Stance Eyes Closed on Foam Pad  - 1 x daily - 7 x weekly - 1 sets - 2 reps   --Added marching on FLOOR with EO and EC and with head turns to HEP - no handout given as pt stated not needed   PATIENT EDUCATION: Education details: balance on foam added to HEP - see Med[employer] HEP above-  educated pt in DHI score (48%) - moderate handicap category Person educated: Patient Education method: Explanation, Demonstration, and Handouts Education comprehension: verbalized understanding and needs further education  HOME EXERCISE PROGRAM: x1 viewing exercise  GOALS: Goals reviewed with patient? Yes  SHORT TERM GOALS: Target date: 01-29-24  Pt will perform x1  viewing exercise in standing position for 60 secs both horizontal and vertical directions with c/o dizziness </= 4/10 intensity to improve gaze stabilization. Baseline: Goal status: INITIAL  2.  Assess DVA and establish LTG. Baseline:  Goal status: INITIAL  3.  Improve DHI score by at least 14 points to demo improvement in dizziness. Baseline: to be completed; score 48% on 01-05-24 Goal status: INITIAL  4.  Assess mCTSIB and establish LTG. (Maintain balance on condition 4 for 30 secs to demo increased vestibular input in maintaining balance - 01-05-24) Baseline:  Goal status: INITIAL  5.  Amb. 30' with horizontal head turns with minimal postural instability with c/o dizziness </= 5/10 intensity. Baseline:  Goal status: INITIAL  6.  Independent in HEP for balance and vestibular exercises. Baseline:  Goal status: INITIAL  LONG TERM GOALS: Target date: 02-26-23  Improve DVA to </= 3 line difference for improved gaze stabilization.; Revised -- </= 2 line difference with no c/o dizziness upon test completion Baseline:  Goal status: INITIAL  2.  Improve DHI score by at least 50% of initial score. (24%) Baseline: 48% (01-05-24) Goal status: INITIAL  3.  Pt will amb. 61' with horizontal head turns without LOB with c/o dizziness </= 3/10.  Baseline:  Goal status: INITIAL  4.  Improve FGA score by at least 8 points to demo improved balance and reduced fall risk with ambulation. Baseline:  Goal status: INITIAL  5.  Demonstrate increased vestibular input by standing on compliant surface with EC for 30 secs without LOB. Baseline:  Goal status: INITIAL  6.  Independent in updated HEP for balance and vestibular exercises. Baseline:  Goal status: INITIAL  7. Report at least 50% improvement in headaches for increased ease with ADL's and mobility and caregiving tasks.            Baseline: c/o moderate headaches daily           Goal status:  INITIAL  ASSESSMENT:  CLINICAL  IMPRESSION: PT session focused on vestibular/vision activities incorporated with dynamic balance and gait.  Pt appeared to have >c/o dizziness with activities requiring visual tracking with eye and head movement in today's session as compared to balance activities with increased vestibular input incorporated.  Pt required short seated rest breaks during session to allow dizziness to subside but pt able to resume activities without adverse effects.  Pt attributes increased dizziness today to heat-related activity performed yesterday, I.e. prolonged ambulation in hot weather yesterday.  Cont with POC.   OBJECTIVE IMPAIRMENTS: decreased balance, difficulty walking, dizziness, and headaches.   ACTIVITY LIMITATIONS: bending, squatting, transfers, bathing, locomotion level, and caring for others  PARTICIPATION LIMITATIONS: meal prep, cleaning, laundry, driving, shopping, community activity, and yard work  PERSONAL FACTORS: 1 comorbidity: h/o mechanical valve replacement and dizziness of unknown etiology  are also affecting patient's functional outcome.   REHAB POTENTIAL: Good  CLINICAL DECISION MAKING: Evolving/moderate complexity  EVALUATION COMPLEXITY: Moderate   PLAN:  PT FREQUENCY: 1-2x/week  PT DURATION: 8 weeks + eval  PLANNED INTERVENTIONS: 97110-Therapeutic exercises, 97530- Therapeutic activity, [id number]- Neuromuscular re-education, [phone]- Self Care, [id number]- Gait training, [id number]- Aquatic Therapy, Patient/Family education, and Vestibular training  PLAN FOR NEXT SESSION: did pt try therapy ball at home?  Cont with balance and vestibular exercises - walking on mat, etc.    Theoren Palka, [place], PT 01/22/2024, 5:33 PM

## 2024-01-22 ENCOUNTER — Encounter: Payer: Self-pay | Admitting: Physical Therapy

## 2024-01-22 ENCOUNTER — Ambulatory Visit (INDEPENDENT_AMBULATORY_CARE_PROVIDER_SITE_OTHER): Admitting: Family Medicine

## 2024-01-22 ENCOUNTER — Encounter: Payer: Self-pay | Admitting: Family Medicine

## 2024-01-22 VITALS — BP 124/72 | HR 74 | Ht 63.0 in | Wt 164.8 lb

## 2024-01-22 DIAGNOSIS — E1165 Type 2 diabetes mellitus with hyperglycemia: Secondary | ICD-10-CM | POA: Diagnosis not present

## 2024-01-22 DIAGNOSIS — Z7985 Long-term (current) use of injectable non-insulin antidiabetic drugs: Secondary | ICD-10-CM

## 2024-01-22 DIAGNOSIS — Z7984 Long term (current) use of oral hypoglycemic drugs: Secondary | ICD-10-CM

## 2024-01-22 DIAGNOSIS — E1159 Type 2 diabetes mellitus with other circulatory complications: Secondary | ICD-10-CM | POA: Diagnosis not present

## 2024-01-22 DIAGNOSIS — I152 Hypertension secondary to endocrine disorders: Secondary | ICD-10-CM | POA: Diagnosis not present

## 2024-01-22 DIAGNOSIS — G43909 Migraine, unspecified, not intractable, without status migrainosus: Secondary | ICD-10-CM | POA: Diagnosis not present

## 2024-01-22 DIAGNOSIS — R519 Headache, unspecified: Secondary | ICD-10-CM | POA: Insufficient documentation

## 2024-01-22 DIAGNOSIS — R42 Dizziness and giddiness: Secondary | ICD-10-CM

## 2024-01-22 MED ORDER — NURTEC 75 MG PO TBDP: 1.0000 | ORAL_TABLET | ORAL | 2 refills | Status: DC

## 2024-01-22 NOTE — Assessment & Plan Note (Signed)
-   Obtain A1C with other laboratory studies - Follow up in 3 months

## 2024-01-22 NOTE — Assessment & Plan Note (Signed)
 Constant right-sided headaches since head injury, different pattern from baseline headaches. Currently taking excessive Tylenol  doses risking liver damage. Possible medication overuse headaches from daily analgesic use. - Reduce Tylenol  to maximum 1000mg  every 8 hours - Avoid additional acetaminophen -containing medications - Prescribe Nurtec for migraine prevention/treatment (prior authorization needed due to contraindications to triptans) - NSAIDs and triptans contraindicated due to cardiac disease - Trial Migrelief (riboflavin 400mg , magnesium 400mg , feverfew) or separate riboflavin and magnesium supplements - Liver function tests due to excessive Tylenol  use - Follow up in 3 months

## 2024-01-22 NOTE — Progress Notes (Signed)
 Established Patient Office Visit  Subjective   Patient ID: Katrina Obrien, female    DOB: 12-25-1955  Age: 68 y.o. MRN: [id number]  Chief Complaint  Patient presents with   Medical Management of Chronic Issues    HPI  Subjective - Constant headache on right side since head injury, different from previous occasional headaches - Aching pain throughout day, worsens at night and upon waking - Morning headaches described as feeling like hangover - Pain worsens with movement - Taking Tylenol  extra strength 500mg , 3-4 tablets 3-4 times daily with variable relief - Reports vestibular rehab helping with mobility - Moving well per therapist but still requires slow movements - Uses cane for grocery store and dog walking, walks dog twice daily - Dizziness with bending over and side-to-side head movements - Recent episode at beach with room spinning when picking up object, resolved quickly - Had migraines with visual symptoms (prism -like lights) since fall - Takes nausea medication when dizzy or severe headache - Blood pressure episode last night: 115/50, bottom number stayed at 50 for 30 minutes, normalized to 125/60s with activity - Reports panic about low blood pressure reading - Blood pressure typically 135 at therapy sessions  Medications: Tylenol  extra strength 500mg  3-4 tablets 3-4 times daily for headaches, anticoagulation for DVT, nausea medication as needed, diazepam  rarely used (wants to discontinue), other vestibular medications rarely taken.  PMHx: Previous occasional headaches, migraines, DVT with pulmonary embolism before surgery, cardiac bypass surgery, hearing loss, recent head injury with vestibular neuritis. PSH: Cardiac bypass. FH: Not mentioned.   ROS: Headaches constant and aching, dizziness with head movements, nausea with severe headaches or dizziness, hearing loss confirmed, no symptoms with low blood pressure readings.   The ASCVD Risk score (Arnett DK, et al.,  2019) failed to calculate for the following reasons:   The valid total cholesterol range is 130 to 320 mg/dL  Health Maintenance Due  Topic Date Due   Pneumococcal Vaccine: 50+ Years (2 of 2 - PPSV23, PCV20, or PCV21) 07/06/2018   COVID-19 Vaccine (4 - 2024-25 season) 03/29/2023   Medicare Annual Wellness (AWV)  02/03/2024      Objective:     BP 124/72   Pulse 74   Ht 5' 3 (1.6 m)   Wt 164 lb 12.8 oz (74.8 kg)   LMP  (LMP Unknown)   SpO2 98%   BMI 29.19 kg/m    Physical Exam Gen: alert, oriented Cv: rrr Pulm: no resp distress   No results found for any visits on 01/22/24.      Assessment & Plan:   Uncontrolled type 2 diabetes mellitus with hyperglycemia (HCC) Assessment & Plan: - Obtain A1C with other laboratory studies - Follow up in 3 months  Orders: -     Comprehensive metabolic panel with GFR -     Hemoglobin A1c  Episodic migraine Assessment & Plan:  Constant right-sided headaches since head injury, different pattern from baseline headaches. Currently taking excessive Tylenol  doses risking liver damage. Possible medication overuse headaches from daily analgesic use. - Reduce Tylenol  to maximum 1000mg  every 8 hours - Avoid additional acetaminophen -containing medications - Prescribe Nurtec for migraine prevention/treatment (prior authorization needed due to contraindications to triptans) - NSAIDs and triptans contraindicated due to cardiac disease - Trial Migrelief (riboflavin 400mg , magnesium 400mg , feverfew) or separate riboflavin and magnesium supplements - Liver function tests due to excessive Tylenol  use - Follow up in 3 months   Vertigo Assessment & Plan: Likely d/t vestibular neuritis.  Improving  with therapy, good mobility progress. Episodes of positional vertigo may indicate concurrent benign positional vertigo. - Continue vestibular rehabilitation - Consider Epley maneuver evaluation if brief positional episodes persist - Rarely using  vestibular medications, wants to discontinue diazepam    Hypertension associated with diabetes [facility] Assessment & Plan:  Recent readings 115/50 concerning for diastolic hypotension, may be heat and activity related. - Monitor blood pressure, especially diastolic readings - Contact office if multiple readings with diastolic <60 or systolic <90 - No blood pressure medication adjustments at this time    Other orders -     Nurtec; Take 1 tablet (75 mg total) by mouth every other day.  Dispense: 16 tablet; Refill: 2     Return in about 3 months (around 04/23/2024) for DM.    [name], MD

## 2024-01-22 NOTE — Assessment & Plan Note (Signed)
 Likely d/t vestibular neuritis.  Improving with therapy, good mobility progress. Episodes of positional vertigo may indicate concurrent benign positional vertigo. - Continue vestibular rehabilitation - Consider Epley maneuver evaluation if brief positional episodes persist - Rarely using vestibular medications, wants to discontinue diazepam 

## 2024-01-22 NOTE — Assessment & Plan Note (Addendum)
 Recent readings 115/50 concerning for diastolic hypotension, may be heat and activity related. - Monitor blood pressure, especially diastolic readings - Contact office if multiple readings with diastolic <60 or systolic <90 - No blood pressure medication adjustments at this time

## 2024-01-22 NOTE — Patient Instructions (Addendum)
 It was nice to see you today,  We addressed the following topics today: -I would like you to take no more than 1000 mg of Tylenol  every 8 hours.  Too much Tylenol  can cause permanent liver damage. - I have sent in a prescription for Nurtec for you to take.  I have written it every other day but this medication can also be taken as needed instead of every other day. - The over-the-counter migraine relief medication is called mg relief.  It contains feverfew, magnesium, riboflavin.  You can also just take magnesium and riboflavin over-the-counter.  The doses of those is 400 mg each. - If your top number for your blood pressure is less than 90 or in the low 90s or the bottom number is in the 50s consistently then let us  know.  Otherwise we will hold off on any changes to your blood pressure medication today.  Have a great day,  Rolan Slain, MD

## 2024-01-23 LAB — COMPREHENSIVE METABOLIC PANEL WITH GFR
ALT: 29 IU/L (ref 0–32)
AST: 21 IU/L (ref 0–40)
Albumin: 4.7 g/dL (ref 3.9–4.9)
Alkaline Phosphatase: 51 IU/L (ref 44–121)
BUN/Creatinine Ratio: 27 (ref 12–28)
BUN: 21 mg/dL (ref 8–27)
Bilirubin Total: 0.5 mg/dL (ref 0.0–1.2)
CO2: 20 mmol/L (ref 20–29)
Calcium: 9.8 mg/dL (ref 8.7–10.3)
Chloride: 102 mmol/L (ref 96–106)
Creatinine, Ser: 0.78 mg/dL (ref 0.57–1.00)
Globulin, Total: 2.3 g/dL (ref 1.5–4.5)
Glucose: 168 mg/dL — ABNORMAL HIGH (ref 70–99)
Potassium: 4.4 mmol/L (ref 3.5–5.2)
Sodium: 139 mmol/L (ref 134–144)
Total Protein: 7 g/dL (ref 6.0–8.5)
eGFR: 83 mL/min/{1.73_m2} (ref >59)

## 2024-01-23 LAB — HEMOGLOBIN A1C
Est. average glucose Bld gHb Est-mCnc: 174 mg/dL
Hgb A1c MFr Bld: 7.7 % — ABNORMAL HIGH (ref 4.8–5.6)

## 2024-01-24 ENCOUNTER — Ambulatory Visit: Payer: Self-pay | Admitting: Family Medicine

## 2024-01-25 ENCOUNTER — Ambulatory Visit: Attending: Internal Medicine

## 2024-01-25 DIAGNOSIS — Z7901 Long term (current) use of anticoagulants: Secondary | ICD-10-CM | POA: Diagnosis not present

## 2024-01-25 LAB — POCT INR: INR: 4.1 — AB (ref 2.0–3.0)

## 2024-01-25 NOTE — Progress Notes (Signed)
Please see anticoagulation encounter.

## 2024-01-25 NOTE — Patient Instructions (Signed)
 Hold tonight and then continue taking warfarin 1 tablet daily except 1.5 tablets each Monday.  Repeat INR in 4 weeks. Coumadin  Clinic [phone]

## 2024-01-26 ENCOUNTER — Ambulatory Visit: Payer: Self-pay | Attending: Physician Assistant | Admitting: Physical Therapy

## 2024-01-26 ENCOUNTER — Other Ambulatory Visit: Payer: Self-pay | Admitting: Internal Medicine

## 2024-01-26 ENCOUNTER — Telehealth: Payer: Self-pay | Admitting: *Deleted

## 2024-01-26 DIAGNOSIS — R262 Difficulty in walking, not elsewhere classified: Secondary | ICD-10-CM | POA: Diagnosis present

## 2024-01-26 DIAGNOSIS — I2699 Other pulmonary embolism without acute cor pulmonale: Secondary | ICD-10-CM

## 2024-01-26 DIAGNOSIS — R42 Dizziness and giddiness: Secondary | ICD-10-CM | POA: Diagnosis present

## 2024-01-26 DIAGNOSIS — I82409 Acute embolism and thrombosis of unspecified deep veins of unspecified lower extremity: Secondary | ICD-10-CM

## 2024-01-26 DIAGNOSIS — Z952 Presence of prosthetic heart valve: Secondary | ICD-10-CM

## 2024-01-26 DIAGNOSIS — R2681 Unsteadiness on feet: Secondary | ICD-10-CM | POA: Insufficient documentation

## 2024-01-26 DIAGNOSIS — Z7901 Long term (current) use of anticoagulants: Secondary | ICD-10-CM

## 2024-01-26 NOTE — Telephone Encounter (Addendum)
 Warfarin 2mg  refill  Aortic Valve Replacement, PE,DVT Last INR 01/25/24 Last OV 02/18/23 and pending an appt on 04/04/24

## 2024-01-26 NOTE — Therapy (Unsigned)
 OUTPATIENT PHYSICAL THERAPY VESTIBULAR TREATMENT NOTE     Patient Name: Katrina Obrien MRN: [id number] DOB:Jul 07, 1956, 68 y.o., female Today's Date: 01/27/2024  END OF SESSION:  PT End of Session - 01/27/24 1844     Visit Number 5    Number of Visits 17    Date for PT Re-Evaluation 02/26/24    Authorization Type [employer]    Authorization Time Period 12-31-23 - 03-04-24    Authorization - Visit Number 5    Authorization - Number of Visits 17    PT Start Time 1103    PT Stop Time 1146    PT Time Calculation (min) 43 min    Equipment Utilized During Treatment Gait belt    Activity Tolerance Patient tolerated treatment well    Behavior During Therapy WFL for tasks assessed/performed              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   Clotting disorder (HCC)    anticoagulant - coumadin  hx DVT, PE   Coronary artery disease    s/p 3- vessel bypass    Diabetes mellitus without complication (HCC)    DVT (deep venous thrombosis) (HCC)    Heart murmur    Hyperlipidemia    Hypertension    Ischemic cardiomyopathy    40 to 45%   Left bundle branch block    Mechanical heart valve present    Jan 2012   Popliteal artery occlusion, left (HCC)    recent  status  post angioplasty   Pulmonary embolism (HCC)    associated with DVT   Pulmonary embolus (HCC)    Thyroid  disease    hyperthyroidism   Past Surgical History:  Procedure Laterality Date   AORTIC VALVE REPLACEMENT (AVR)/CORONARY ARTERY BYPASS GRAFTING (CABG)  08/16/2010  VAN TRIGT   AV replacement w/19-mm mechanical St Jude valve ([id number]),CABGX3  lima to LAD, SVG to distal circ,SVG to posterior descending   ASD REPAIR     CARDIAC CATHETERIZATION  08/08/2010   ,LAD 50% PROX,605 TANDEM SEGMENTAL STENOSIS,LEFT CIRC 60%-AV GROOVE, RGT COR 40% PROX ON THE BEND ,80% PROX PDA,   CORONARY  ARTERY BYPASS GRAFT  08/16/2010   LIMA TO lad,svg to distal circ ,svg to posterior descending   DOPPLER ECHOCARDIOGRAPHY  08/09/2010   EF35% to 40% ,bbicuspid ;severely  thickened ,severely calcified leaflets, LV normal   LEA doppler  08/07/2010   left popliteal occlusion ,and anterior tibial complete occlusion   NM MYOCAR PERF WALL MOTION  08/06/2010   EF 31%  LOW RISK SCAN   nuc     PV ANGIOGRAM  08/08/2010   successful PTA LEFT POPLITEAL   Patient Active Problem List   Diagnosis Date Noted   Headache 01/22/2024   Vertigo 11/26/2023   Blood in stool 11/26/2023   Osteoporosis 09/21/2023   Head injury 09/14/2023   Rib pain 09/14/2023   Restless leg syndrome 02/16/2023   Plantar fasciitis of right foot 06/16/2022   Caregiver stress syndrome 03/08/2019   Vitamin D  insufficiency 09/13/2018   White coat syndrome with diagnosis of hypertension 09/13/2018   Hyperlipidemia associated with type 2 diabetes mellitus (HCC) 02/03/2018   Hypothyroidism 02/03/2018   LBBB (left bundle branch block) 04/06/2014   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   Aortic stenosis 01/26/2013   S/P AVR 01/26/2013   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  Hypertension associated with diabetes (HCC) 01/26/2013   Familial hyperlipidemia 01/26/2013   Cardiomyopathy, ischemic 01/26/2013   DVT (deep venous thrombosis) (HCC) 01/26/2013   Pulmonary embolus (HCC) 01/26/2013   Uncontrolled type 2 diabetes mellitus with hyperglycemia (HCC) 01/26/2013   Long term (current) use of anticoagulants 10/11/2012    PCP: Chandra Toribio POUR., MD REFERRING PROVIDER: Palmer Purchase, PA-C  REFERRING DIAG:  Diagnosis  R42 (ICD-10-CM) - Vertigo    THERAPY DIAG:  Dizziness and giddiness  Difficulty in walking, not elsewhere classified  Unsteadiness on feet  ONSET DATE: 11-19-23  Rationale for Evaluation and Treatment: Rehabilitation  SUBJECTIVE:   SUBJECTIVE STATEMENT: Pt reports she is feeling much better today  than she was last Thursday; pt amb. Without SPC today.  Saw her PCP last Friday about the HA's - he is prescribing Nurtec but she is waiting on insurance approval.  MD told her to try the Epley to see if it helped the dizziness - told her it can't hurt; pt states she did it and feels that it helped but is not sure that she did it right - would like to review it to learn correct way to do it Pt accompanied by: self  PERTINENT HISTORY: Per chart note Katrina Obrien is a 68 y.o. female with history of diabetes, CAD s/p three-vessel bypass, aortic valve stenosis with mechanical valve replacement, hyperlipidemia, hypertension, PE/DVT on Coumadin , left bundle branch block, hypothyroidism, who presents the emergency department with dizziness.  Per patient report, symptoms started waking her up from sleep late Thursday night, 5 days ago.  She was doing some work when she suddenly felt very lightheaded like she might lose consciousness.  She laid on the couch and her symptoms resolved and she went to bed as normal.  She woke up around 2 AM and felt like she was going to fall out of the bed and vomit.  She tried to walk to the bathroom and had to lay down in the hallway.  She then called 911.  She was brought to the ER and had evaluation, including cardiac workup.  She had a CT scan of her abdomen that was normal as well.  She was sent home after her symptoms were improving with medications given.  PAIN:  Are you having pain? Headache - started last night  PRECAUTIONS: Other: dizziness - fall risk  RED FLAGS: None   WEIGHT BEARING RESTRICTIONS: No  FALLS: Has patient fallen in last 6 months? Yes. Number of falls 1  LIVING ENVIRONMENT: Lives with: lives with their spouse Lives in: House/apartment  PLOF: Independent  PATIENT GOALS: resolve the dizziness - find out what I can do to make it better  OBJECTIVE:  Note: Objective measures were completed at Evaluation unless otherwise  noted.  DIAGNOSTIC FINDINGS: IMPRESSION: CT scan Stable since February and normal for age noncontrast CT appearance of the Brain.  COGNITION: Overall cognitive status: Within functional limits for tasks assessed  POSTURE:  No Significant postural limitations  Cervical ROM:  WNL's  BED MOBILITY:  Independent   TRANSFERS: Assistive device utilized: None  Sit to stand: Complete Independence  GAIT: Gait pattern: WFL Distance walked: 71' Assistive device utilized: None Level of assistance: Modified independence Comments: decreased speed due to dizziness; decreased head turns during amb. Due to dizziness  FUNCTIONAL TESTS:  Pt able to stand Romberg with EO for 30 secs; with EC for 17 secs with moderate postural sway   PATIENT SURVEYS:  DHI to be completed (gave to  pt at eval)  VESTIBULAR ASSESSMENT:  GENERAL OBSERVATION: pt is a 68 yr old lady with onset of dizziness that started 6 weeks ago; pt is unable to turn her head without experiencing moderate to severe dizziness   SYMPTOM BEHAVIOR:  Subjective history: pt reports onset of dizziness 6 weeks ago - had severe vomiting - called 911; MRI and CT scan negative for MI and CVA; pt evaluated by ENT but no etiology of dizziness determined - possibly vestibular neuritis; migraine ruled out.  Pt states she is much improved from initial onset. Is now driving but minimizes head movements  Non-Vestibular symptoms: changes in vision, headaches, tinnitus, nausea/vomiting, migraine symptoms, and tinnitus has gotten louder since dizziness onset; had a migraine on Sunday   Type of dizziness: Blurred Vision, Imbalance (Disequilibrium), Unsteady with head/body turns, Lightheadedness/Faint, World moves, and needs to move slowly  Frequency: mostly constant  Duration: mostly constant   Aggravating factors: Induced by motion: occur when walking, bending down to the ground, turning body quickly, turning head quickly, and sitting in a moving car,  Worse with fatigue, Worse in the dark, and worse in grocery store  Relieving factors: head stationary, lying supine, rest, slow movements, and avoid busy/distracting environments  Progression of symptoms: better  OCULOMOTOR EXAM:  Ocular Alignment: normal  Ocular ROM: No Limitations  Spontaneous Nystagmus: absent  Gaze-Induced Nystagmus: absent  Smooth Pursuits: mild nystagmus noted with horizontal smooth pursuits - c/o moderate dizziness with this testing  Saccades: intact     VESTIBULAR - OCULAR REFLEX:   Slow VOR: Comment: not tested due to c/o dizziness  VOR Cancellation: Comment: c/o dizziness  Head-Impulse Test: HIT Right: positive HIT Left: positive unable to maintain gaze on PT's nose with Rt and Lt HIT  Dynamic Visual Acuity: Not able to be assessed   POSITIONAL TESTING: NT based on pt's symptomology   MOTION SENSITIVITY:  Motion Sensitivity Quotient Intensity: 0 = none, 1 = Lightheaded, 2 = Mild, 3 = Moderate, 4 = Severe, 5 = Vomiting  Intensity  1. Sitting to supine   2. Supine to L side   3. Supine to R side   4. Supine to sitting   5. L Hallpike-Dix   6. Up from L    7. R Hallpike-Dix   8. Up from R    9. Sitting, head tipped to L knee 0  10. Head up from L knee 3  11. Sitting, head tipped to R knee 0  12. Head up from R knee 3  13. Sitting head turns x5 4  14.Sitting head nods x5 4  15. In stance, 180 turn to L    16. In stance, 180 turn to R                                                                                                                               TREATMENT DATE: 01-26-24  Pt amb. Without device to today's session  TherAct: Standing on blue mat on incline - marching with EO 10 reps with no head turns; progressed to marching with EO horizontal head turns 5 reps, vertical head turns 5 reps; marching with EC - 5 reps x 2 sets  Alternate stepping forward/back 5 reps - then added stepping with head turn to contralateral side - 5 reps  each side:  stepping backward/forward with head turn - 5 reps each side  Sitting on green physioball - bouncing with EO 10 reps, with EC - bouncing - then added head turns horizontal and vertical with EO and then with EC 10 reps each - with RUE support on mat prn Sit to stand from mat with EC with feet on Airex 5 reps - no UE support  NeuroRe-ed: Rt and Lt  Dix-Hallpike tests (-) with no nystagmus and no c/o vertigo in test position  Reviewed Epley maneuver for pt education in performing maneuver correctly - performed Lt Epley maneuver (based on position/location on mat table) - instructed pt to reverse it if she was going to perform Rt Epley for Rt BPPV  Habituation - pt reported mild dizziness with rolling from supine to Rt sidelying - no nystagmus noted;  performed 5 reps of rolling supine to Rt sidelying  Pt amb. 30' making circles CW with ball x 1 rep, CCW with ball x 1 rep; letter + pattern, V pattern - 30' x 1 rep each direction -- pt reported more dizziness with CW and CCW directions     ---------------------------------- Issued these exercises for HEP   Medbridge:  Access Code: [facility] URL: [email or web address] Date: 01/08/2024 Prepared by: Rock Kussmaul  Exercises - Standing on foam pad - Eyes open and then with Eyes closed   - 1 x daily - 7 x weekly - 1 sets - 2 reps - Romberg Stance on Foam Pad  - 1 x daily - 7 x weekly - 1 sets - 2 reps - Romberg Stance Eyes Closed on Foam Pad  - 1 x daily - 7 x weekly - 1 sets - 2 reps   --Added marching on FLOOR with EO and EC and with head turns to HEP - no handout given as pt stated not needed    Self Treatment for Left Posterior / Anterior Canalithiasis    Sitting on bed: 1. Turn head 45 left. (a) Lie back slowly, shoulders on pillow, head on bed. (b) Hold _20___ seconds. 2. Keeping head on bed, turn head 90 right. Hold _20___ seconds. 3. Roll to right, head on 45 angle down toward bed. Hold _30___ seconds.  4. Sit up on right side of bed. Repeat __3__ times per session. Do __2__ sessions per day.   PATIENT EDUCATION: Education details: Teaching laboratory technician for habituation prn; also gave pt handout on Migraines and Dizziness from [employer] Person educated: Patient Education method: Explanation, Demonstration, and Handouts Education comprehension: verbalized understanding and needs further education  HOME EXERCISE PROGRAM: x1 viewing exercise  GOALS: Goals reviewed with patient? Yes  SHORT TERM GOALS: Target date: 01-29-24  Pt will perform x1 viewing exercise in standing position for 60 secs both horizontal and vertical directions with c/o dizziness </= 4/10 intensity to improve gaze stabilization. Baseline: Goal status: INITIAL  2.  Assess DVA and establish LTG. Baseline:  Goal status: INITIAL  3.  Improve DHI score by at least 14 points to demo improvement in dizziness. Baseline: to be completed; score 48% on 01-05-24 Goal status: INITIAL  4.  Assess mCTSIB and establish  LTG. (Maintain balance on condition 4 for 30 secs to demo increased vestibular input in maintaining balance - 01-05-24) Baseline:  Goal status: INITIAL  5.  Amb. 30' with horizontal head turns with minimal postural instability with c/o dizziness </= 5/10 intensity. Baseline:  Goal status: INITIAL  6.  Independent in HEP for balance and vestibular exercises. Baseline:  Goal status: INITIAL  LONG TERM GOALS: Target date: 02-26-23  Improve DVA to </= 3 line difference for improved gaze stabilization.; Revised -- </= 2 line difference with no c/o dizziness upon test completion Baseline:  Goal status: INITIAL  2.  Improve DHI score by at least 50% of initial score. (24%) Baseline: 48% (01-05-24) Goal status: INITIAL  3.  Pt will amb. 81' with horizontal head turns without LOB with c/o dizziness </= 3/10.  Baseline:  Goal status: INITIAL  4.  Improve FGA score by at least 8 points to demo improved balance and reduced fall  risk with ambulation. Baseline:  Goal status: INITIAL  5.  Demonstrate increased vestibular input by standing on compliant surface with EC for 30 secs without LOB. Baseline:  Goal status: INITIAL  6.  Independent in updated HEP for balance and vestibular exercises. Baseline:  Goal status: INITIAL  7. Report at least 50% improvement in headaches for increased ease with ADL's and mobility and caregiving tasks.            Baseline: c/o moderate headaches daily           Goal status:  INITIAL  ASSESSMENT:  CLINICAL IMPRESSION: PT session focused on balance exercises with increased vestibular input, habituation and visual tracking exercises.  Pt reported > dizziness provoked with amb. With ball making CW and CCW circles compared to + and V patterns.  Pt was instructed in correct performance of Epley maneuver as she reported this maneuver helped, after MD recommended she try it; pt continues to have (-) Rt and Lt Dix-Hallpike tests but may benefit from Epley for motion sensitivity/habituation.  Pt continues to have postural instability with standing with EC on compliant surfaces, indicative of decreased vestibular input.  Cont with POC.   OBJECTIVE IMPAIRMENTS: decreased balance, difficulty walking, dizziness, and headaches.   ACTIVITY LIMITATIONS: bending, squatting, transfers, bathing, locomotion level, and caring for others  PARTICIPATION LIMITATIONS: meal prep, cleaning, laundry, driving, shopping, community activity, and yard work  PERSONAL FACTORS: 1 comorbidity: h/o mechanical valve replacement and dizziness of unknown etiology  are also affecting patient's functional outcome.   REHAB POTENTIAL: Good  CLINICAL DECISION MAKING: Evolving/moderate complexity  EVALUATION COMPLEXITY: Moderate   PLAN:  PT FREQUENCY: 1-2x/week  PT DURATION: 8 weeks + eval  PLANNED INTERVENTIONS: 97110-Therapeutic exercises, 97530- Therapeutic activity, [id number]- Neuromuscular re-education, [id number]-  Self Care, [id number]- Gait training, [phone]- Aquatic Therapy, Patient/Family education, and Vestibular training  PLAN FOR NEXT SESSION: check STG's;  Cont with balance and vestibular exercises - walking on mat, etc.    Zenita Kister, [place], PT 01/27/2024, 6:47 PM

## 2024-01-26 NOTE — Telephone Encounter (Signed)
 Received denial for nurtec, states per [facility] pharmacy coverage policy patient should have tried emgality AND qulipta. Denial letter is in media if you need more information.

## 2024-01-27 ENCOUNTER — Encounter: Payer: Self-pay | Admitting: Physical Therapy

## 2024-01-27 ENCOUNTER — Telehealth: Payer: Self-pay

## 2024-01-27 ENCOUNTER — Other Ambulatory Visit: Payer: Self-pay | Admitting: Family Medicine

## 2024-01-27 MED ORDER — QULIPTA 30 MG PO TABS: 1.0000 | ORAL_TABLET | Freq: Every day | ORAL | 2 refills | Status: DC

## 2024-01-27 NOTE — Telephone Encounter (Signed)
 Called patient she was advised of her Rx

## 2024-01-27 NOTE — Patient Instructions (Signed)
Self Treatment for Left Posterior / Anterior Canalithiasis    Sitting on bed: 1. Turn head 45 left. (a) Lie back slowly, shoulders on pillow, head on bed. (b) Hold ____ seconds. 2. Keeping head on bed, turn head 90 right. Hold ____ seconds. 3. Roll to right, head on 45 angle down toward bed. Hold ____ seconds. 4. Sit up on right side of bed. Repeat ____ times per session. Do ____ sessions per day.  Copyright  [employer]. All rights reserved.   

## 2024-01-28 ENCOUNTER — Ambulatory Visit: Payer: Self-pay | Admitting: Physical Therapy

## 2024-01-28 DIAGNOSIS — R262 Difficulty in walking, not elsewhere classified: Secondary | ICD-10-CM

## 2024-01-28 DIAGNOSIS — R42 Dizziness and giddiness: Secondary | ICD-10-CM | POA: Diagnosis not present

## 2024-01-28 DIAGNOSIS — R2681 Unsteadiness on feet: Secondary | ICD-10-CM

## 2024-01-28 NOTE — Therapy (Signed)
 OUTPATIENT PHYSICAL THERAPY VESTIBULAR TREATMENT NOTE     Patient Name: Katrina Obrien MRN: [id number] DOB:[birth year], 68 y.o., female Today's Date: 01/29/2024  END OF SESSION:  PT End of Session - 01/29/24 1733     Visit Number 6    Number of Visits 17    Date for PT Re-Evaluation 02/26/24    Authorization Type [employer]    Authorization Time Period 12-31-23 - 03-04-24    Authorization - Visit Number 6    Authorization - Number of Visits 17    Progress Note Due on Visit 10    PT Start Time 1320    PT Stop Time 1405    PT Time Calculation (min) 45 min    Equipment Utilized During Treatment Gait belt    Activity Tolerance Patient tolerated treatment well    Behavior During Therapy WFL for tasks assessed/performed               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   Clotting disorder (HCC)    anticoagulant - coumadin  hx DVT, PE   Coronary artery disease    s/p 3- vessel bypass    Diabetes mellitus without complication (HCC)    DVT (deep venous thrombosis) (HCC)    Heart murmur    Hyperlipidemia    Hypertension    Ischemic cardiomyopathy    40 to 45%   Left bundle branch block    Mechanical heart valve present    Jan 2012   Popliteal artery occlusion, left (HCC)    recent  status  post angioplasty   Pulmonary embolism (HCC)    associated with DVT   Pulmonary embolus (HCC)    Thyroid  disease    hyperthyroidism   Past Surgical History:  Procedure Laterality Date   AORTIC VALVE REPLACEMENT (AVR)/CORONARY ARTERY BYPASS GRAFTING (CABG)  08/16/2010  VAN TRIGT   AV replacement w/19-mm mechanical St Jude valve ([id number]),CABGX3  lima to LAD, SVG to distal circ,SVG to posterior descending   ASD REPAIR     CARDIAC CATHETERIZATION  08/08/2010   ,LAD 50% PROX,605 TANDEM SEGMENTAL STENOSIS,LEFT CIRC 60%-AV GROOVE, RGT COR 40% PROX ON THE  BEND ,80% PROX PDA,   CORONARY ARTERY BYPASS GRAFT  08/16/2010   LIMA TO lad,svg to distal circ ,svg to posterior descending   DOPPLER ECHOCARDIOGRAPHY  08/09/2010   EF35% to 40% ,bbicuspid ;severely  thickened ,severely calcified leaflets, LV normal   LEA doppler  08/07/2010   left popliteal occlusion ,and anterior tibial complete occlusion   NM MYOCAR PERF WALL MOTION  08/06/2010   EF 31%  LOW RISK SCAN   nuc     PV ANGIOGRAM  08/08/2010   successful PTA LEFT POPLITEAL   Patient Active Problem List   Diagnosis Date Noted   Headache 01/22/2024   Vertigo 11/26/2023   Blood in stool 11/26/2023   Osteoporosis 09/21/2023   Head injury 09/14/2023   Rib pain 09/14/2023   Restless leg syndrome 02/16/2023   Plantar fasciitis of right foot 06/16/2022   Caregiver stress syndrome 03/08/2019   Vitamin D  insufficiency 09/13/2018   White coat syndrome with diagnosis of hypertension 09/13/2018   Hyperlipidemia associated with type 2 diabetes mellitus (HCC) 02/03/2018   Hypothyroidism 02/03/2018   LBBB (left bundle branch block) 04/06/2014   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   Aortic stenosis 01/26/2013  S/P AVR 01/26/2013   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   Hypertension associated with diabetes (HCC) 01/26/2013   Familial hyperlipidemia 01/26/2013   Cardiomyopathy, ischemic 01/26/2013   DVT (deep venous thrombosis) (HCC) 01/26/2013   Pulmonary embolus (HCC) 01/26/2013   Uncontrolled type 2 diabetes mellitus with hyperglycemia (HCC) 01/26/2013   Long term (current) use of anticoagulants 10/11/2012    PCP: Chandra Toribio POUR., MD REFERRING PROVIDER: Palmer Purchase, PA-C  REFERRING DIAG:  Diagnosis  R42 (ICD-10-CM) - Vertigo    THERAPY DIAG:  Dizziness and giddiness  Unsteadiness on feet  Difficulty in walking, not elsewhere classified  ONSET DATE: 11-19-23  Rationale for Evaluation and Treatment: Rehabilitation  SUBJECTIVE:   SUBJECTIVE STATEMENT: Pt reports she  is doing a lot better today - continues to improve; is not using cane today to walk into clinic - says she left it in the car.  Going to beach next week. Pt accompanied by: self  PERTINENT HISTORY: Per chart note Katrina Obrien is a 68 y.o. female with history of diabetes, CAD s/p three-vessel bypass, aortic valve stenosis with mechanical valve replacement, hyperlipidemia, hypertension, PE/DVT on Coumadin , left bundle branch block, hypothyroidism, who presents the emergency department with dizziness.  Per patient report, symptoms started waking her up from sleep late Thursday night, 5 days ago.  She was doing some work when she suddenly felt very lightheaded like she might lose consciousness.  She laid on the couch and her symptoms resolved and she went to bed as normal.  She woke up around 2 AM and felt like she was going to fall out of the bed and vomit.  She tried to walk to the bathroom and had to lay down in the hallway.  She then called 911.  She was brought to the ER and had evaluation, including cardiac workup.  She had a CT scan of her abdomen that was normal as well.  She was sent home after her symptoms were improving with medications given.  PAIN:  Are you having pain? Headache - started last night  PRECAUTIONS: Other: dizziness - fall risk  RED FLAGS: None   WEIGHT BEARING RESTRICTIONS: No  FALLS: Has patient fallen in last 6 months? Yes. Number of falls 1  LIVING ENVIRONMENT: Lives with: lives with their spouse Lives in: House/apartment  PLOF: Independent  PATIENT GOALS: resolve the dizziness - find out what I can do to make it better  OBJECTIVE:  Note: Objective measures were completed at Evaluation unless otherwise noted.  DIAGNOSTIC FINDINGS: IMPRESSION: CT scan Stable since February and normal for age noncontrast CT appearance of the Brain.  COGNITION: Overall cognitive status: Within functional limits for tasks assessed  POSTURE:  No Significant postural  limitations  Cervical ROM:  WNL's  BED MOBILITY:  Independent   TRANSFERS: Assistive device utilized: None  Sit to stand: Complete Independence  GAIT: Gait pattern: WFL Distance walked: 88' Assistive device utilized: None Level of assistance: Modified independence Comments: decreased speed due to dizziness; decreased head turns during amb. Due to dizziness  FUNCTIONAL TESTS:  Pt able to stand Romberg with EO for 30 secs; with EC for 17 secs with moderate postural sway   PATIENT SURVEYS:  DHI to be completed (gave to pt at eval)  VESTIBULAR ASSESSMENT:  GENERAL OBSERVATION: pt is a 68 yr old lady with onset of dizziness that started 6 weeks ago; pt is unable to turn her head without experiencing moderate to severe dizziness   SYMPTOM BEHAVIOR:  Subjective  history: pt reports onset of dizziness 6 weeks ago - had severe vomiting - called 911; MRI and CT scan negative for MI and CVA; pt evaluated by ENT but no etiology of dizziness determined - possibly vestibular neuritis; migraine ruled out.  Pt states she is much improved from initial onset. Is now driving but minimizes head movements  Non-Vestibular symptoms: changes in vision, headaches, tinnitus, nausea/vomiting, migraine symptoms, and tinnitus has gotten louder since dizziness onset; had a migraine on Sunday   Type of dizziness: Blurred Vision, Imbalance (Disequilibrium), Unsteady with head/body turns, Lightheadedness/Faint, World moves, and needs to move slowly  Frequency: mostly constant  Duration: mostly constant   Aggravating factors: Induced by motion: occur when walking, bending down to the ground, turning body quickly, turning head quickly, and sitting in a moving car, Worse with fatigue, Worse in the dark, and worse in grocery store  Relieving factors: head stationary, lying supine, rest, slow movements, and avoid busy/distracting environments  Progression of symptoms: better  OCULOMOTOR EXAM:  Ocular Alignment:  normal  Ocular ROM: No Limitations  Spontaneous Nystagmus: absent  Gaze-Induced Nystagmus: absent  Smooth Pursuits: mild nystagmus noted with horizontal smooth pursuits - c/o moderate dizziness with this testing  Saccades: intact     VESTIBULAR - OCULAR REFLEX:   Slow VOR: Comment: not tested due to c/o dizziness  VOR Cancellation: Comment: c/o dizziness  Head-Impulse Test: HIT Right: positive HIT Left: positive unable to maintain gaze on PT's nose with Rt and Lt HIT  Dynamic Visual Acuity: Not able to be assessed   POSITIONAL TESTING: NT based on pt's symptomology   MOTION SENSITIVITY:  Motion Sensitivity Quotient Intensity: 0 = none, 1 = Lightheaded, 2 = Mild, 3 = Moderate, 4 = Severe, 5 = Vomiting  Intensity  1. Sitting to supine   2. Supine to L side   3. Supine to R side   4. Supine to sitting   5. L Hallpike-Dix   6. Up from L    7. R Hallpike-Dix   8. Up from R    9. Sitting, head tipped to L knee 0  10. Head up from L knee 3  11. Sitting, head tipped to R knee 0  12. Head up from R knee 3  13. Sitting head turns x5 4  14.Sitting head nods x5 4  15. In stance, 180 turn to L    16. In stance, 180 turn to R                                                                                                                               TREATMENT DATE: 01-28-24  Pt amb. Without device to today's session  Amb. 30'  x 2 reps with horizontal head turns with minimal postural instability  TherAct: Pt amb. 30' making circles CW with ball x 1 rep, CCW with ball x 1 rep; marching in place on checkered cloth on floor - made  circles with ball CW 5 reps, then CCW 5 reps with CGA as dizziness provoked with this exercise  Sit to stand from mat with pivot turns - 5 reps to Rt side, 5 reps to Lt side for habituation   Pt performed picking up 4 objects off patterned cloth (black & white checkered cloth)  - bend down, picked up each object individually and then turned 180 degrees to  place on other end of cloth - for habituation and for improved gaze stabilization with increased visual input  Pt performed stepping/marching on trampoline - tossed small green medicine ball and caught ball - progressed to turning 360 degrees 1 rep to Lt side, 1 rep to Rt side - pt reported moderate dizziness provoked with this exercise  NeuroRe-ed:  SVA - line 8;  DVA line 8  (0 line difference) - pt reported no vertigo upon completion of test  X1 viewing exercise - in standing - target on plain background 4' away - 60 secs horizontal head turns, 60 secs vertical head turns - pt reported no dizziness upon completion with either directions  Pt stood on Airex with feet together with EC for 30 secs (condition 4 of mCTSIB)   ---------------------------------- Issued these exercises for HEP   Medbridge:  Access Code: [facility] URL: [email or web address] Date: 01/08/2024 Prepared by: Rock Kussmaul  Exercises - Standing on foam pad - Eyes open and then with Eyes closed   - 1 x daily - 7 x weekly - 1 sets - 2 reps - Romberg Stance on Foam Pad  - 1 x daily - 7 x weekly - 1 sets - 2 reps - Romberg Stance Eyes Closed on Foam Pad  - 1 x daily - 7 x weekly - 1 sets - 2 reps   --Added marching on FLOOR with EO and EC and with head turns to HEP - no handout given as pt stated not needed    Self Treatment for Left Posterior / Anterior Canalithiasis    Sitting on bed: 1. Turn head 45 left. (a) Lie back slowly, shoulders on pillow, head on bed. (b) Hold _20___ seconds. 2. Keeping head on bed, turn head 90 right. Hold _20___ seconds. 3. Roll to right, head on 45 angle down toward bed. Hold _30___ seconds. 4. Sit up on right side of bed. Repeat __3__ times per session. Do __2__ sessions per day.   PATIENT EDUCATION: Education details: Teaching laboratory technician for habituation prn; also gave pt handout on Migraines and Dizziness from [employer] Person educated: Patient Education method:  Explanation, Demonstration, and Handouts Education comprehension: verbalized understanding and needs further education  HOME EXERCISE PROGRAM: x1 viewing exercise  GOALS: Goals reviewed with patient? Yes  SHORT TERM GOALS: Target date: 01-29-24  Pt will perform x1 viewing exercise in standing position for 60 secs both horizontal and vertical directions with c/o dizziness </= 4/10 intensity to improve gaze stabilization. Baseline: Goal status: Goal met 01-28-24  2.  Assess DVA and establish LTG. Baseline: Goal met 01-05-24  3.  Improve DHI score by at least 14 points to demo improvement in dizziness. Baseline: to be completed; score 48% on 01-05-24 Goal status: Ongoing -01-28-24  4.  Assess mCTSIB and establish LTG. (Maintain balance on condition 4 for 30 secs to demo increased vestibular input in maintaining balance - 01-05-24) Baseline:  Goal status: Goal met 01-28-24  5.  Amb. 30' with horizontal head turns with minimal postural instability with c/o dizziness </= 5/10 intensity. Baseline: no dizziness, just minimal  unsteadiness -- 01-28-24 Goal status: Goal met 01-28-24  6.  Independent in HEP for balance and vestibular exercises. Baseline:  Goal status: Goal met   LONG TERM GOALS: Target date: 02-26-23  Improve DVA to </= 3 line difference for improved gaze stabilization.; Revised -- </= 2 line difference with no c/o dizziness upon test completion Baseline:  Goal status: Goal met 01-28-24  2.  Improve DHI score by at least 50% of initial score. (24%) Baseline: 48% (01-05-24) Goal status: INITIAL  3.  Pt will amb. 68' with horizontal head turns without LOB with c/o dizziness </= 3/10.  Baseline:  Goal status: INITIAL  4.  Improve FGA score by at least 8 points to demo improved balance and reduced fall risk with ambulation. Baseline:  Goal status: INITIAL  5.  Demonstrate increased vestibular input by standing on compliant surface with EC for 30 secs without LOB. Baseline:  Goal  status: INITIAL  6.  Independent in updated HEP for balance and vestibular exercises. Baseline:  Goal status: INITIAL  7. Report at least 50% improvement in headaches for increased ease with ADL's and mobility and caregiving tasks.            Baseline: c/o moderate headaches daily           Goal status:  INITIAL  ASSESSMENT:  CLINICAL IMPRESSION: PT session focused on assessment of STG's:  pt has met STG's #1, 2, 4-6:  STG #3 is ongoing as DHI will be reassessed at discharge.  Pt was most challenged by activities with increased visual input, with use of patterned checkered cloth used for increased visual/conflicting input with balance activity.  Pt able to take short seated rest period to allow dizziness to subside.  Pt is progressing well.  Cont with POC.   OBJECTIVE IMPAIRMENTS: decreased balance, difficulty walking, dizziness, and headaches.   ACTIVITY LIMITATIONS: bending, squatting, transfers, bathing, locomotion level, and caring for others  PARTICIPATION LIMITATIONS: meal prep, cleaning, laundry, driving, shopping, community activity, and yard work  PERSONAL FACTORS: 1 comorbidity: h/o mechanical valve replacement and dizziness of unknown etiology  are also affecting patient's functional outcome.   REHAB POTENTIAL: Good  CLINICAL DECISION MAKING: Evolving/moderate complexity  EVALUATION COMPLEXITY: Moderate   PLAN:  PT FREQUENCY: 1-2x/week  PT DURATION: 8 weeks + eval  PLANNED INTERVENTIONS: 97110-Therapeutic exercises, 97530- Therapeutic activity, [id number]- Neuromuscular re-education, [phone]- Self Care, [id number]- Gait training, [phone]- Aquatic Therapy, Patient/Family education, and Vestibular training  PLAN FOR NEXT SESSION:   Cont with balance and vestibular exercises - walking on mat, etc.    Zerrick Hanssen, [place], PT 01/29/2024, 5:35 PM

## 2024-01-29 ENCOUNTER — Encounter: Payer: Self-pay | Admitting: Physical Therapy

## 2024-02-02 ENCOUNTER — Encounter: Payer: Self-pay | Admitting: Physical Therapy

## 2024-02-09 ENCOUNTER — Ambulatory Visit: Payer: Self-pay | Admitting: Physical Therapy

## 2024-02-09 DIAGNOSIS — R42 Dizziness and giddiness: Secondary | ICD-10-CM | POA: Diagnosis not present

## 2024-02-09 DIAGNOSIS — R2681 Unsteadiness on feet: Secondary | ICD-10-CM

## 2024-02-09 NOTE — Therapy (Unsigned)
 OUTPATIENT PHYSICAL THERAPY VESTIBULAR TREATMENT NOTE     Patient Name: Katrina Obrien MRN: [id number] DOB:[birth year], 68 y.o., female Today's Date: 02/10/2024  END OF SESSION:  PT End of Session - 02/10/24 1946     Visit Number 7    Number of Visits 17    Date for PT Re-Evaluation 02/26/24    Authorization Type [employer]    Authorization Time Period 12-31-23 - 03-04-24    Authorization - Visit Number 7    Authorization - Number of Visits 17    Progress Note Due on Visit 10    PT Start Time 1536    PT Stop Time 1622    PT Time Calculation (min) 46 min    Equipment Utilized During Treatment Gait belt    Activity Tolerance Patient tolerated treatment well    Behavior During Therapy WFL for tasks assessed/performed                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   Clotting disorder (HCC)    anticoagulant - coumadin  hx DVT, PE   Coronary artery disease    s/p 3- vessel bypass    Diabetes mellitus without complication (HCC)    DVT (deep venous thrombosis) (HCC)    Heart murmur    Hyperlipidemia    Hypertension    Ischemic cardiomyopathy    40 to 45%   Left bundle branch block    Mechanical heart valve present    Jan 2012   Popliteal artery occlusion, left (HCC)    recent  status  post angioplasty   Pulmonary embolism (HCC)    associated with DVT   Pulmonary embolus (HCC)    Thyroid  disease    hyperthyroidism   Past Surgical History:  Procedure Laterality Date   AORTIC VALVE REPLACEMENT (AVR)/CORONARY ARTERY BYPASS GRAFTING (CABG)  08/16/2010  VAN TRIGT   AV replacement w/19-mm mechanical St Jude valve ([id number]),CABGX3  lima to LAD, SVG to distal circ,SVG to posterior descending   ASD REPAIR     CARDIAC CATHETERIZATION  08/08/2010   ,LAD 50% PROX,605 TANDEM SEGMENTAL STENOSIS,LEFT CIRC 60%-AV GROOVE, RGT COR 40% PROX ON  THE BEND ,80% PROX PDA,   CORONARY ARTERY BYPASS GRAFT  08/16/2010   LIMA TO lad,svg to distal circ ,svg to posterior descending   DOPPLER ECHOCARDIOGRAPHY  08/09/2010   EF35% to 40% ,bbicuspid ;severely  thickened ,severely calcified leaflets, LV normal   LEA doppler  08/07/2010   left popliteal occlusion ,and anterior tibial complete occlusion   NM MYOCAR PERF WALL MOTION  08/06/2010   EF 31%  LOW RISK SCAN   nuc     PV ANGIOGRAM  08/08/2010   successful PTA LEFT POPLITEAL   Patient Active Problem List   Diagnosis Date Noted   Headache 01/22/2024   Vertigo 11/26/2023   Blood in stool 11/26/2023   Osteoporosis 09/21/2023   Head injury 09/14/2023   Rib pain 09/14/2023   Restless leg syndrome 02/16/2023   Plantar fasciitis of right foot 06/16/2022   Caregiver stress syndrome 03/08/2019   Vitamin D  insufficiency 09/13/2018   White coat syndrome with diagnosis of hypertension 09/13/2018   Hyperlipidemia associated with type 2 diabetes mellitus (HCC) 02/03/2018   Hypothyroidism 02/03/2018   LBBB (left bundle branch block) 04/06/2014   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   Aortic stenosis 01/26/2013  S/P AVR 01/26/2013   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   Hypertension associated with diabetes (HCC) 01/26/2013   Familial hyperlipidemia 01/26/2013   Cardiomyopathy, ischemic 01/26/2013   DVT (deep venous thrombosis) (HCC) 01/26/2013   Pulmonary embolus (HCC) 01/26/2013   Uncontrolled type 2 diabetes mellitus with hyperglycemia (HCC) 01/26/2013   Long term (current) use of anticoagulants 10/11/2012    PCP: Chandra Toribio POUR., MD REFERRING PROVIDER: Palmer Purchase, PA-C  REFERRING DIAG:  Diagnosis  R42 (ICD-10-CM) - Vertigo    THERAPY DIAG:  Unsteadiness on feet  Dizziness and giddiness  ONSET DATE: 11-19-23  Rationale for Evaluation and Treatment: Rehabilitation  SUBJECTIVE:   SUBJECTIVE STATEMENT: Pt reports she went to the beach last week and did better  walking up the [place] to get to the beach than she did on her last trip a few weeks ago; helped with [employer] this morning - says the kids did an activity with flashlights and the moving lights made her dizzy initially, but stated it improved as activity progressed.  Pt now taking medication for migraines and this is helping a lot.  Pt accompanied by: self  PERTINENT HISTORY: Per chart note RAVIN BENDALL is a 68 y.o. female with history of diabetes, CAD s/p three-vessel bypass, aortic valve stenosis with mechanical valve replacement, hyperlipidemia, hypertension, PE/DVT on Coumadin , left bundle branch block, hypothyroidism, who presents the emergency department with dizziness.  Per patient report, symptoms started waking her up from sleep late Thursday night, 5 days ago.  She was doing some work when she suddenly felt very lightheaded like she might lose consciousness.  She laid on the couch and her symptoms resolved and she went to bed as normal.  She woke up around 2 AM and felt like she was going to fall out of the bed and vomit.  She tried to walk to the bathroom and had to lay down in the hallway.  She then called 911.  She was brought to the ER and had evaluation, including cardiac workup.  She had a CT scan of her abdomen that was normal as well.  She was sent home after her symptoms were improving with medications given.  PAIN:  Are you having pain? Headache - started last night  PRECAUTIONS: Other: dizziness - fall risk  RED FLAGS: None   WEIGHT BEARING RESTRICTIONS: No  FALLS: Has patient fallen in last 6 months? Yes. Number of falls 1  LIVING ENVIRONMENT: Lives with: lives with their spouse Lives in: House/apartment  PLOF: Independent  PATIENT GOALS: resolve the dizziness - find out what I can do to make it better  OBJECTIVE:  Note: Objective measures were completed at Evaluation unless otherwise noted.  DIAGNOSTIC FINDINGS: IMPRESSION: CT scan Stable since  February and normal for age noncontrast CT appearance of the Brain.  COGNITION: Overall cognitive status: Within functional limits for tasks assessed  POSTURE:  No Significant postural limitations  Cervical ROM:  WNL's  BED MOBILITY:  Independent   TRANSFERS: Assistive device utilized: None  Sit to stand: Complete Independence  GAIT: Gait pattern: WFL Distance walked: 54' Assistive device utilized: None Level of assistance: Modified independence Comments: decreased speed due to dizziness; decreased head turns during amb. Due to dizziness  FUNCTIONAL TESTS:  Pt able to stand Romberg with EO for 30 secs; with EC for 17 secs with moderate postural sway   PATIENT SURVEYS:  DHI to be completed (gave to pt at eval)  VESTIBULAR ASSESSMENT:  GENERAL OBSERVATION: pt  is a 68 yr old lady with onset of dizziness that started 6 weeks ago; pt is unable to turn her head without experiencing moderate to severe dizziness   SYMPTOM BEHAVIOR:  Subjective history: pt reports onset of dizziness 6 weeks ago - had severe vomiting - called 911; MRI and CT scan negative for MI and CVA; pt evaluated by ENT but no etiology of dizziness determined - possibly vestibular neuritis; migraine ruled out.  Pt states she is much improved from initial onset. Is now driving but minimizes head movements  Non-Vestibular symptoms: changes in vision, headaches, tinnitus, nausea/vomiting, migraine symptoms, and tinnitus has gotten louder since dizziness onset; had a migraine on Sunday   Type of dizziness: Blurred Vision, Imbalance (Disequilibrium), Unsteady with head/body turns, Lightheadedness/Faint, World moves, and needs to move slowly  Frequency: mostly constant  Duration: mostly constant   Aggravating factors: Induced by motion: occur when walking, bending down to the ground, turning body quickly, turning head quickly, and sitting in a moving car, Worse with fatigue, Worse in the dark, and worse in grocery  store  Relieving factors: head stationary, lying supine, rest, slow movements, and avoid busy/distracting environments  Progression of symptoms: better  OCULOMOTOR EXAM:  Ocular Alignment: normal  Ocular ROM: No Limitations  Spontaneous Nystagmus: absent  Gaze-Induced Nystagmus: absent  Smooth Pursuits: mild nystagmus noted with horizontal smooth pursuits - c/o moderate dizziness with this testing  Saccades: intact     VESTIBULAR - OCULAR REFLEX:   Slow VOR: Comment: not tested due to c/o dizziness  VOR Cancellation: Comment: c/o dizziness  Head-Impulse Test: HIT Right: positive HIT Left: positive unable to maintain gaze on PT's nose with Rt and Lt HIT  Dynamic Visual Acuity: Not able to be assessed   POSITIONAL TESTING: NT based on pt's symptomology   MOTION SENSITIVITY:  Motion Sensitivity Quotient Intensity: 0 = none, 1 = Lightheaded, 2 = Mild, 3 = Moderate, 4 = Severe, 5 = Vomiting  Intensity  1. Sitting to supine   2. Supine to L side   3. Supine to R side   4. Supine to sitting   5. L Hallpike-Dix   6. Up from L    7. R Hallpike-Dix   8. Up from R    9. Sitting, head tipped to L knee 0  10. Head up from L knee 3  11. Sitting, head tipped to R knee 0  12. Head up from R knee 3  13. Sitting head turns x5 4  14.Sitting head nods x5 4  15. In stance, 180 turn to L    16. In stance, 180 turn to R                                                                                                                               TREATMENT DATE: 02-09-24  Pt continues to ambulate into clinic without use of SPC  TherAct:  Pt performed stepping/marching on  trampoline - tossed and caught red medicine ball 5 reps straight up in front:  then tossed on Rt/Lt sides and caught ball 5 reps each;   progressed to turning 360 degrees 1 rep to Lt side, 1 rep to Rt side - pt reported no dizziness with turning to either side, but imbalance with each direction of turning;  bouncing 5 reps  (small range) for vestibular input and facilitation of otoliths   Habituation/balance activity - pt stood on blue mat - picked up 5# kettlebell off floor and lifted straight up; progressed to picking up and lifting in diagonal pattern X 5 reps each; pt then turned 360 degrees 1 rep to each side - minimal imbalance noted with turning on mat, however, pt did perform each turn slowly which assisted in maintaining balance  NeuroRe-ed:  Standing Balance: Surface: Airex Position: Narrow Base of Support Feet Hip Width Apart Completed with: Eyes Open and Eyes Closed; Head Turns x 5 Reps and Head Nods x 5 Reps    Pt amb. 30' on floor - reading colored Shiela chart held stationary on 1st rep ; performed 2 additional reps with moving Shiela chart horizontally and then vertically ; pt reported dizziness upon completion of this activity  Pt performed marching on blue mat on incline - EO 5 reps; added head turns horizontally 5 reps, then vertically 5 reps; same sequence performed with EC - 5 reps - no head turns with marching, 5 reps with marching with EC with horizontal head turns, then 5 reps vertical head turns with mod to min assist for balance recovery  Pt amb. 4 reps along counter with EC - with hand near counter to assist with balance as needed; added this activity to her HEP  ---------------------------------- Issued these exercises for HEP   Medbridge:  Access Code: [facility] URL: [email or web address] Date: 01/08/2024 Prepared by: Rock Kussmaul  Exercises - Standing on foam pad - Eyes open and then with Eyes closed   - 1 x daily - 7 x weekly - 1 sets - 2 reps - Romberg Stance on Foam Pad  - 1 x daily - 7 x weekly - 1 sets - 2 reps - Romberg Stance Eyes Closed on Foam Pad  - 1 x daily - 7 x weekly - 1 sets - 2 reps   --Added marching on FLOOR with EO and EC and with head turns to HEP - no handout given as pt stated not needed    Self Treatment for Left Posterior / Anterior  Canalithiasis    Sitting on bed: 1. Turn head 45 left. (a) Lie back slowly, shoulders on pillow, head on bed. (b) Hold _20___ seconds. 2. Keeping head on bed, turn head 90 right. Hold _20___ seconds. 3. Roll to right, head on 45 angle down toward bed. Hold _30___ seconds. 4. Sit up on right side of bed. Repeat __3__ times per session. Do __2__ sessions per day.   PATIENT EDUCATION: Education details: Amb. With EC along counter at home - 3 reps, 1x /daily - use UE support prn for balance recovery Person educated: Patient Education method: Explanation, Demonstration, and Handouts Education comprehension: verbalized understanding and needs further education  HOME EXERCISE PROGRAM: x1 viewing exercise  GOALS: Goals reviewed with patient? Yes  SHORT TERM GOALS: Target date: 01-29-24  Pt will perform x1 viewing exercise in standing position for 60 secs both horizontal and vertical directions with c/o dizziness </= 4/10 intensity to improve gaze stabilization. Baseline: Goal status: Goal met 01-28-24  2.  Assess DVA and establish LTG. Baseline: Goal met 01-05-24  3.  Improve DHI score by at least 14 points to demo improvement in dizziness. Baseline: to be completed; score 48% on 01-05-24 Goal status: Ongoing -01-28-24  4.  Assess mCTSIB and establish LTG. (Maintain balance on condition 4 for 30 secs to demo increased vestibular input in maintaining balance - 01-05-24) Baseline:  Goal status: Goal met 01-28-24  5.  Amb. 30' with horizontal head turns with minimal postural instability with c/o dizziness </= 5/10 intensity. Baseline: no dizziness, just minimal unsteadiness -- 01-28-24 Goal status: Goal met 01-28-24  6.  Independent in HEP for balance and vestibular exercises. Baseline:  Goal status: Goal met 01-28-24  LONG TERM GOALS: Target date: 02-26-23  Improve DVA to </= 3 line difference for improved gaze stabilization.; Revised -- </= 2 line difference with no c/o dizziness upon test  completion Baseline:  Goal status: Goal met 01-28-24  2.  Improve DHI score by at least 50% of initial score. (24%) Baseline: 48% (01-05-24) Goal status: INITIAL  3.  Pt will amb. 40' with horizontal head turns without LOB with c/o dizziness </= 3/10.  Baseline:  Goal status: INITIAL  4.  Improve FGA score by at least 8 points to demo improved balance and reduced fall risk with ambulation. Baseline: 18/30 Goal status: INITIAL  5.  Demonstrate increased vestibular input by standing on compliant surface with EC for 30 secs without LOB. Baseline:  Goal status: INITIAL  6.  Independent in updated HEP for balance and vestibular exercises. Baseline:  Goal status: INITIAL  7. Report at least 50% improvement in headaches for increased ease with ADL's and mobility and caregiving tasks.            Baseline: c/o moderate headaches daily           Goal status:  INITIAL  ASSESSMENT:  CLINICAL IMPRESSION: PT session focused on balance and gait activities with increased vestibular input incorporated.  Pt is most challenged by activities with eyes closed with standing on compliant surfaces.  Pt able to amb. 10' along counter with EC with mild postural instability, but balance noted to improve on subsequent reps.  This activity was added to pt's HEP and she verbalized and demonstrated understanding.  Pt is progressing well towards LTG's.  Cont with POC.   OBJECTIVE IMPAIRMENTS: decreased balance, difficulty walking, dizziness, and headaches.   ACTIVITY LIMITATIONS: bending, squatting, transfers, bathing, locomotion level, and caring for others  PARTICIPATION LIMITATIONS: meal prep, cleaning, laundry, driving, shopping, community activity, and yard work  PERSONAL FACTORS: 1 comorbidity: h/o mechanical valve replacement and dizziness of unknown etiology  are also affecting patient's functional outcome.   REHAB POTENTIAL: Good  CLINICAL DECISION MAKING: Evolving/moderate complexity  EVALUATION  COMPLEXITY: Moderate   PLAN:  PT FREQUENCY: 1-2x/week  PT DURATION: 8 weeks + eval  PLANNED INTERVENTIONS: 97110-Therapeutic exercises, 97530- Therapeutic activity, [id number]- Neuromuscular re-education, [phone]- Self Care, [id number]- Gait training, [phone]- Aquatic Therapy, Patient/Family education, and Vestibular training  PLAN FOR NEXT SESSION:  how did walking with EC go at home?  Do SOT:   Cont with balance and vestibular exercises - walking on mat, etc.    [place], [place], PT 02/10/2024, 8:32 PM

## 2024-02-10 ENCOUNTER — Encounter: Payer: Self-pay | Admitting: Physical Therapy

## 2024-02-11 ENCOUNTER — Ambulatory Visit: Payer: Self-pay | Admitting: Physical Therapy

## 2024-02-11 ENCOUNTER — Ambulatory Visit: Payer: Medicare PPO

## 2024-02-11 DIAGNOSIS — Z0000 Encounter for general adult medical examination without abnormal findings: Secondary | ICD-10-CM

## 2024-02-11 DIAGNOSIS — R42 Dizziness and giddiness: Secondary | ICD-10-CM

## 2024-02-11 DIAGNOSIS — R2681 Unsteadiness on feet: Secondary | ICD-10-CM

## 2024-02-11 NOTE — Patient Instructions (Signed)
 Katrina Obrien , Thank you for taking time out of your busy schedule to complete your Annual Wellness Visit with me. I enjoyed our conversation and look forward to speaking with you again next year. I, as well as your care team,  appreciate your ongoing commitment to your health goals. Please review the following plan we discussed and let me know if I can assist you in the future. Your Game plan/ To Do List    Referrals: If you haven't heard from the office you've been referred to, please reach out to them at the phone provided.  N/a Follow up Visits: Next Medicare AWV with our clinical staff: 03/30/2025 at 10:50   Have you seen your provider in the last 6 months (3 months if uncontrolled diabetes)? Yes Next Office Visit with your provider: 04/25/2024 at 8:10  Clinician Recommendations:  Aim for 30 minutes of exercise or brisk walking, 6-8 glasses of water, and 5 servings of fruits and vegetables each day.       This is a list of the screening recommended for you and due dates:  Health Maintenance  Topic Date Due   Pneumococcal Vaccine for age over 58 (2 of 2 - PPSV23, PCV20, or PCV21) 07/06/2018   COVID-19 Vaccine (4 - 2024-25 season) 03/29/2023   Flu Shot  02/26/2024   Eye exam for diabetics  03/08/2024   Yearly kidney health urinalysis for diabetes  05/18/2024   Complete foot exam   05/18/2024   Hemoglobin A1C  07/23/2024   Yearly kidney function blood test for diabetes  01/21/2025   Medicare Annual Wellness Visit  02/10/2025   Mammogram  03/03/2025   Cologuard (Stool DNA test)  11/17/2025   DTaP/Tdap/Td vaccine (2 - Td or Tdap) 02/09/2028   DEXA scan (bone density measurement)  Completed   Hepatitis C Screening  Completed   Zoster (Shingles) Vaccine  Completed   Hepatitis B Vaccine  Aged Out   HPV Vaccine  Aged Out   Meningitis B Vaccine  Aged Out   Colon Cancer Screening  Discontinued    Advanced directives: (In Chart) A copy of your advanced directives are scanned into your chart  should your provider ever need it. Advance Care Planning is important because it:  [x]  Makes sure you receive the medical care that is consistent with your values, goals, and preferences  [x]  It provides guidance to your family and loved ones and reduces their decisional burden about whether or not they are making the right decisions based on your wishes.  Follow the link provided in your after visit summary or read over the paperwork we have mailed to you to help you started getting your Advance Directives in place. If you need assistance in completing these, please reach out to us  so that we can help you!  See attachments for Preventive Care and Fall Prevention Tips.

## 2024-02-11 NOTE — Progress Notes (Signed)
 Subjective:   Katrina Obrien is a 68 y.o. who presents for a Medicare Wellness preventive visit.  As a reminder, Annual Wellness Visits don't include a physical exam, and some assessments may be limited, especially if this visit is performed virtually. We may recommend an in-person follow-up visit with your provider if needed.  Visit Complete: Virtual I connected with  Katrina Obrien on 02/11/24 by a audio enabled telemedicine application and verified that I am speaking with the correct person using two identifiers.  Patient Location: Home  Provider Location: Home Office  I discussed the limitations of evaluation and management by telemedicine. The patient expressed understanding and agreed to proceed.  Vital Signs: Because this visit was a virtual/telehealth visit, some criteria may be missing or patient reported. Any vitals not documented were not able to be obtained and vitals that have been documented are patient reported.  VideoError- Librarian, academic were attempted between this provider and patient, however failed, due to patient having technical difficulties OR patient did not have access to video capability.  We continued and completed visit with audio only.   Persons Participating in Visit: Patient.  AWV Questionnaire: Yes: Patient Medicare AWV questionnaire was completed by the patient on 02/11/2024; I have confirmed that all information answered by patient is correct and no changes since this date.  Cardiac Risk Factors include: advanced age (>63men, >23 women);diabetes mellitus;dyslipidemia;hypertension     Objective:    Today's Vitals   There is no height or weight on file to calculate BMI.     02/11/2024   10:52 AM 01/01/2024    8:50 PM 11/24/2023   11:17 AM 06/15/2023    1:04 PM 02/03/2023    9:42 AM 01/12/2023    3:01 PM 08/09/2019    8:02 AM  Advanced Directives  Does Patient Have a Medical Advance Directive? Yes No No Yes Yes Yes  Yes  Type of Estate agent of [place];Living will   Healthcare Power of [place];Living will Healthcare Power of [place];Living will    Does patient want to make changes to medical advance directive?    No - Patient declined No - Patient declined  No - Patient declined  Copy of Healthcare Power of Attorney in Chart? Yes - validated most recent copy scanned in chart (See row information)   No - copy requested Yes - validated most recent copy scanned in chart (See row information)    Would patient like information on creating a medical advance directive?  No - Patient declined No - Patient declined        Current Medications (verified) Outpatient Encounter Medications as of 02/11/2024  Medication Sig   aspirin EC 81 MG tablet Take 81 mg by mouth daily.   Atogepant  (QULIPTA ) 30 MG TABS Take 1 tablet (30 mg total) by mouth daily.   atorvastatin  (LIPITOR) 40 MG tablet TAKE 1 TABLET (40 MG TOTAL) BY MOUTH DAILY.   diazepam  (VALIUM ) 2 MG tablet Take 1 tablet (2 mg total) by mouth every 6 (six) hours as needed (dizziness).   Evolocumab  (REPATHA  SURECLICK) 140 MG/ML SOAJ INJECT 140 MG INTO THE SKIN EVERY 14 DAYS.   furosemide  (LASIX ) 40 MG tablet Take 0.5 tablets (20 mg total) by mouth daily.   JARDIANCE  25 MG TABS tablet TAKE 1 TABLET (25 MG TOTAL) BY MOUTH DAILY.   levothyroxine  (SYNTHROID ) 50 MCG tablet TAKE 1 TABLET BY MOUTH DAILY BEFORE BREAKFAST   meclizine  (ANTIVERT ) 25 MG tablet Take 1  tablet (25 mg total) by mouth 3 (three) times daily as needed for dizziness.   metoprolol  succinate (TOPROL -XL) 100 MG 24 hr tablet TAKE 1 TABLET BY MOUTH DAILY. TAKE WITH OR IMMEDIATELY FOLLOWING A MEAL.   ondansetron  (ZOFRAN -ODT) 4 MG disintegrating tablet Take 1 tablet (4 mg total) by mouth every 8 (eight) hours as needed for nausea or vomiting.   pregabalin  (LYRICA ) 75 MG capsule TAKE 1 CAPSULE (75 MG TOTAL) BY MOUTH DAILY. TAKE 1 TO 3 HOURS BEFORE BEDTIME. CAN INCREASE TO 2 CAPSULES  DAILY.   sacubitril -valsartan  (ENTRESTO ) 24-26 MG TAKE 1 TABLET BY MOUTH TWICE A DAY   Semaglutide , 2 MG/DOSE, (OZEMPIC , 2 MG/DOSE,) 8 MG/3ML SOPN Inject 2 mg into the skin once a week.   VITAMIN D  PO Take 1,000 Units by mouth daily.   warfarin (COUMADIN ) 2 MG tablet TAKE 1 TO 1 AND 1/2 TABLETS BY MOUTH DAILY AS DIRECTED BY COUMADIN  CLINIC.   Atogepant  (QULIPTA ) 30 MG TABS Take by mouth.   No facility-administered encounter medications on file as of 02/11/2024.    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   Clotting disorder (HCC)    anticoagulant - coumadin  hx DVT, PE   Coronary artery disease    s/p 3- vessel bypass    Diabetes mellitus without complication (HCC)    DVT (deep venous thrombosis) (HCC)    Heart murmur    Hyperlipidemia    Hypertension    Ischemic cardiomyopathy    40 to 45%   Left bundle branch block    Mechanical heart valve present    Jan 2012   Popliteal artery occlusion, left (HCC)    recent  status  post angioplasty   Pulmonary embolism (HCC)    associated with DVT   Pulmonary embolus (HCC)    Thyroid  disease    hyperthyroidism   Past Surgical History:  Procedure Laterality Date   AORTIC VALVE REPLACEMENT (AVR)/CORONARY ARTERY BYPASS GRAFTING (CABG)  08/16/2010  VAN TRIGT   AV replacement w/19-mm mechanical St Jude valve ([id number]),CABGX3  lima to LAD, SVG to distal circ,SVG to posterior descending   ASD REPAIR     CARDIAC CATHETERIZATION  08/08/2010   ,LAD 50% PROX,605 TANDEM SEGMENTAL STENOSIS,LEFT CIRC 60%-AV GROOVE, RGT COR 40% PROX ON THE BEND ,80% PROX PDA,   CARDIAC VALVE REPLACEMENT  08/16/2010   CORONARY ARTERY BYPASS GRAFT  08/16/2010   LIMA TO lad,svg to distal circ ,svg to posterior descending   DOPPLER ECHOCARDIOGRAPHY   08/09/2010   EF35% to 40% ,bbicuspid ;severely  thickened ,severely calcified leaflets, LV normal   LEA doppler  08/07/2010   left popliteal occlusion ,and anterior tibial complete occlusion   NM MYOCAR PERF WALL MOTION  08/06/2010   EF 31%  LOW RISK SCAN   nuc     PV ANGIOGRAM  08/08/2010   successful PTA LEFT POPLITEAL   TUBAL LIGATION  1987   Family History  Problem Relation Age of Onset   Cancer Mother    Early death Mother    Cancer Father    CAD Father    Heart attack Father 23       first MI, 2 major after that, multiple silent MIs   Early death Father    Heart disease Father    Stroke Brother  CAD Brother 78       first stent   Early death Brother    Heart attack Brother    CAD Brother 16       CABG    Hypertension Brother    Heart attack Brother 45       died from MI   Diabetes Brother    Early death Brother    Heart disease Brother    Heart disease Brother    Hypertension Brother    Heart disease Brother    Hypertension Brother    Colon cancer Neg Hx    Esophageal cancer Neg Hx    Rectal cancer Neg Hx    Stomach cancer Neg Hx    Social History   Socioeconomic History   Marital status: Married    Spouse name: Not on file   Number of children: Not on file   Years of education: Not on file   Highest education level: 12th grade  Occupational History   Not on file  Tobacco Use   Smoking status: Never    Passive exposure: Never   Smokeless tobacco: Never  Vaping Use   Vaping status: Never Used  Substance and Sexual Activity   Alcohol use: Yes    Alcohol/week: 7.0 standard drinks of alcohol    Types: 7 Standard drinks or equivalent per week   Drug use: No   Sexual activity: Not Currently  Other Topics Concern   Not on file  Social History Narrative   Not on file   Social Drivers of Health   Financial Resource Strain: Low Risk  (02/11/2024)   Overall Financial Resource Strain (CARDIA)    Difficulty of Paying Living Expenses: Not hard at all   Food Insecurity: No Food Insecurity (02/11/2024)   Hunger Vital Sign    Worried About Running Out of Food in the Last Year: Never true    Ran Out of Food in the Last Year: Never true  Transportation Needs: No Transportation Needs (02/11/2024)   PRAPARE - Administrator, Civil Service (Medical): No    Lack of Transportation (Non-Medical): No  Physical Activity: Sufficiently Active (02/11/2024)   Exercise Vital Sign    Days of Exercise per Week: 7 days    Minutes of Exercise per Session: 50 min  Recent Concern: Physical Activity - Insufficiently Active (01/18/2024)   Exercise Vital Sign    Days of Exercise per Week: 7 days    Minutes of Exercise per Session: 20 min  Stress: No Stress Concern Present (02/11/2024)   [employer] of Occupational Health - Occupational Stress Questionnaire    Feeling of Stress: Not at all  Social Connections: Socially Integrated (02/11/2024)   Social Connection and Isolation Panel    Frequency of Communication with Friends and Family: More than three times a week    Frequency of Social Gatherings with Friends and Family: More than three times a week    Attends Religious Services: More than 4 times per year    Active Member of [employer] or Organizations: Yes    Attends Engineer, structural: More than 4 times per year    Marital Status: Married    Tobacco Counseling Counseling given: Not Answered    Clinical Intake:  Pre-visit preparation completed: Yes  Pain : No/denies pain     Nutritional Risks: None Diabetes: Yes CBG done?: No Did pt. bring in CBG monitor from home?: No  Lab Results  Component Value Date   HGBA1C  7.7 (H) 01/22/2024   HGBA1C 7.4 (H) 09/15/2023   HGBA1C 7.1 (H) 05/12/2023     How often do you need to have someone help you when you read instructions, pamphlets, or other written materials from your doctor or pharmacy?: 1 - Never  Interpreter Needed?: No  Information entered by :: NAllen  LPN   Activities of Daily Living     02/11/2024    7:47 AM  In your present state of health, do you have any difficulty performing the following activities:  Hearing? 0  Vision? 0  Difficulty concentrating or making decisions? 0  Walking or climbing stairs? 0  Dressing or bathing? 0  Doing errands, shopping? 0  Preparing Food and eating ? N  Using the Toilet? N  In the past six months, have you accidently leaked urine? N  Do you have problems with loss of bowel control? N  Managing your Medications? N  Managing your Finances? N  Housekeeping or managing your Housekeeping? N    Patient Care Team: Chandra Toribio POUR, MD as PCP - General (Family Medicine) Mona Vinie BROCKS, MD as PCP - Cardiology (Cardiology)  I have updated your Care Teams any recent Medical Services you may have received from other providers in the past year.     Assessment:   This is a routine wellness examination for Katrina Obrien.  Hearing/Vision screen Hearing Screening - Comments:: Denies hearing issues Vision Screening - Comments:: Regular eye exams, [facility]   Goals Addressed             This Visit's Progress    Patient Stated       02/11/2024, wants to get balance back       Depression Screen     02/11/2024   10:55 AM 01/22/2024    9:28 AM 12/08/2023   11:24 AM 11/26/2023    2:01 PM 09/21/2023    2:51 PM 02/03/2023    9:40 AM 01/12/2023    3:02 PM  PHQ 2/9 Scores  PHQ - 2 Score 0 0 1 0 0 0 0  PHQ- 9 Score 1 3 2  0 2 0 2    Fall Risk     02/11/2024    7:47 AM 12/08/2023   11:24 AM 09/21/2023    2:51 PM 02/03/2023    9:41 AM 02/02/2023    3:53 PM  Fall Risk   Falls in the past year? 1 1 1  0 0  Comment tripped      Number falls in past yr: 1 1 1  0 0  Injury with Fall? 1 1 1  0 0  Risk for fall due to : History of fall(s);Medication side effect Other (Comment) History of fall(s) No Fall Risks   Follow up Falls prevention discussed;Falls evaluation completed Falls evaluation completed Falls  evaluation completed Falls prevention discussed     MEDICARE RISK AT HOME:  Medicare Risk at Home Any stairs in or around the home?: (Patient-Rptd) Yes If so, are there any without handrails?: (Patient-Rptd) No Home free of loose throw rugs in walkways, pet beds, electrical cords, etc?: (Patient-Rptd) Yes Adequate lighting in your home to reduce risk of falls?: (Patient-Rptd) Yes Life alert?: (Patient-Rptd) No Use of a cane, walker or w/c?: (Patient-Rptd) No Grab bars in the bathroom?: (Patient-Rptd) Yes Shower chair or bench in shower?: (Patient-Rptd) Yes Elevated toilet seat or a handicapped toilet?: (Patient-Rptd) Yes  TIMED UP AND GO:  Was the test performed?  No  Cognitive Function: 6CIT completed  02/11/2024   10:55 AM 02/03/2023    9:42 AM  6CIT Screen  What Year? 0 points 0 points  What month? 0 points 0 points  What time? 0 points 0 points  Count back from 20 0 points 0 points  Months in reverse 0 points 0 points  Repeat phrase 0 points 0 points  Total Score 0 points 0 points    Immunizations Immunization History  Administered Date(s) Administered   Fluad Quad(high Dose 65+) 06/16/2022   Influenza Inj Mdck Quad Pf 03/28/2018, 04/23/2019   Influenza,inj,Quad PF,6+ Mos 05/12/2017, 04/17/2020, 04/04/2021   PFIZER(Purple Top)SARS-COV-2 Vaccination 09/27/2019, 10/18/2019, 05/11/2020   Pneumococcal Conjugate-13 05/11/2018   Tdap 02/08/2018   Zoster Recombinant(Shingrix ) 05/11/2018, 10/10/2021    Screening Tests Health Maintenance  Topic Date Due   Pneumococcal Vaccine: 50+ Years (2 of 2 - PPSV23, PCV20, or PCV21) 07/06/2018   COVID-19 Vaccine (4 - 2024-25 season) 03/29/2023   INFLUENZA VACCINE  02/26/2024   OPHTHALMOLOGY EXAM  03/08/2024   Diabetic kidney evaluation - Urine ACR  05/18/2024   FOOT EXAM  05/18/2024   HEMOGLOBIN A1C  07/23/2024   Diabetic kidney evaluation - eGFR measurement  01/21/2025   Medicare Annual Wellness (AWV)  02/10/2025    MAMMOGRAM  03/03/2025   Fecal DNA (Cologuard)  11/17/2025   DTaP/Tdap/Td (2 - Td or Tdap) 02/09/2028   DEXA SCAN  Completed   Hepatitis C Screening  Completed   Zoster Vaccines- Shingrix   Completed   Hepatitis B Vaccines  Aged Out   HPV VACCINES  Aged Out   Meningococcal B Vaccine  Aged Out   Colonoscopy  Discontinued    Health Maintenance  Health Maintenance Due  Topic Date Due   Pneumococcal Vaccine: 50+ Years (2 of 2 - PPSV23, PCV20, or PCV21) 07/06/2018   COVID-19 Vaccine (4 - 2024-25 season) 03/29/2023   Health Maintenance Items Addressed: Due for pneumonia vaccine and declines covid vaccine  Additional Screening:  Vision Screening: Recommended annual ophthalmology exams for early detection of glaucoma and other disorders of the eye. Would you like a referral to an eye doctor? No    Dental Screening: Recommended annual dental exams for proper oral hygiene  Community Resource Referral / Chronic Care Management: CRR required this visit?  No   CCM required this visit?  No   Plan:    I have personally reviewed and noted the following in the patient's chart:   Medical and social history Use of alcohol, tobacco or illicit drugs  Current medications and supplements including opioid prescriptions. Patient is not currently taking opioid prescriptions. Functional ability and status Nutritional status Physical activity Advanced directives List of other physicians Hospitalizations, surgeries, and ER visits in previous 12 months Vitals Screenings to include cognitive, depression, and falls Referrals and appointments  In addition, I have reviewed and discussed with patient certain preventive protocols, quality metrics, and best practice recommendations. A written personalized care plan for preventive services as well as general preventive health recommendations were provided to patient.   Katrina FORBES Dawn, LPN   [id number]/82/[id number]   After Visit Summary: (MyChart) Due to this  being a telephonic visit, the after visit summary with patients personalized plan was offered to patient via MyChart   Notes: Nothing significant to report at this time.

## 2024-02-11 NOTE — Therapy (Signed)
 OUTPATIENT PHYSICAL THERAPY VESTIBULAR TREATMENT NOTE     Patient Name: Katrina Obrien MRN: [id number] DOB:1956-04-07, 68 y.o., female Today's Date: 02/13/2024  END OF SESSION:  PT End of Session - 02/13/24 1713     Visit Number 8    Number of Visits 17    Date for PT Re-Evaluation 02/26/24    Authorization Type [employer]    Authorization Time Period 12-31-23 - 03-04-24    Authorization - Visit Number 8    Authorization - Number of Visits 17    Progress Note Due on Visit 10    PT Start Time 1403    PT Stop Time 1447    PT Time Calculation (min) 44 min    Equipment Utilized During Treatment Other (comment)   harness vest used with SOT on balance master   Activity Tolerance Patient tolerated treatment well    Behavior During Therapy WFL for tasks assessed/performed                 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   Clotting disorder (HCC)    anticoagulant - coumadin  hx DVT, PE   Coronary artery disease    s/p 3- vessel bypass    Diabetes mellitus without complication (HCC)    DVT (deep venous thrombosis) (HCC)    Heart murmur    Hyperlipidemia    Hypertension    Ischemic cardiomyopathy    40 to 45%   Left bundle branch block    Mechanical heart valve present    Jan 2012   Popliteal artery occlusion, left (HCC)    recent  status  post angioplasty   Pulmonary embolism (HCC)    associated with DVT   Pulmonary embolus (HCC)    Thyroid  disease    hyperthyroidism   Past Surgical History:  Procedure Laterality Date   AORTIC VALVE REPLACEMENT (AVR)/CORONARY ARTERY BYPASS GRAFTING (CABG)  08/16/2010  VAN TRIGT   AV replacement w/19-mm mechanical St Jude valve ([id number]),CABGX3  lima to LAD, SVG to distal circ,SVG to posterior descending   ASD REPAIR     CARDIAC CATHETERIZATION  08/08/2010   ,LAD 50% PROX,605 TANDEM SEGMENTAL  STENOSIS,LEFT CIRC 60%-AV GROOVE, RGT COR 40% PROX ON THE BEND ,80% PROX PDA,   CARDIAC VALVE REPLACEMENT  08/16/2010   CORONARY ARTERY BYPASS GRAFT  08/16/2010   LIMA TO lad,svg to distal circ ,svg to posterior descending   DOPPLER ECHOCARDIOGRAPHY  08/09/2010   EF35% to 40% ,bbicuspid ;severely  thickened ,severely calcified leaflets, LV normal   LEA doppler  08/07/2010   left popliteal occlusion ,and anterior tibial complete occlusion   NM MYOCAR PERF WALL MOTION  08/06/2010   EF 31%  LOW RISK SCAN   nuc     PV ANGIOGRAM  08/08/2010   successful PTA LEFT POPLITEAL   TUBAL LIGATION  1987   Patient Active Problem List   Diagnosis Date Noted   Headache 01/22/2024   Vertigo 11/26/2023   Blood in stool 11/26/2023   Osteoporosis 09/21/2023   Head injury 09/14/2023   Rib pain 09/14/2023   Restless leg syndrome 02/16/2023   Plantar fasciitis of right foot 06/16/2022   Caregiver stress syndrome 03/08/2019   Vitamin D  insufficiency 09/13/2018   White coat syndrome with diagnosis of hypertension 09/13/2018   Hyperlipidemia associated with type 2 diabetes mellitus (HCC) 02/03/2018   Hypothyroidism  02/03/2018   LBBB (left bundle branch block) 04/06/2014   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   Aortic stenosis 01/26/2013   S/P AVR 01/26/2013   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   Hypertension associated with diabetes (HCC) 01/26/2013   Familial hyperlipidemia 01/26/2013   Cardiomyopathy, ischemic 01/26/2013   DVT (deep venous thrombosis) (HCC) 01/26/2013   Pulmonary embolus (HCC) 01/26/2013   Uncontrolled type 2 diabetes mellitus with hyperglycemia (HCC) 01/26/2013   Long term (current) use of anticoagulants 10/11/2012    PCP: Chandra Toribio POUR., MD REFERRING PROVIDER: Palmer Purchase, PA-C  REFERRING DIAG:  Diagnosis  R42 (ICD-10-CM) - Vertigo    THERAPY DIAG:  Unsteadiness on feet  Dizziness and giddiness  ONSET DATE: 11-19-23  Rationale for Evaluation and Treatment:  Rehabilitation  SUBJECTIVE:   SUBJECTIVE STATEMENT: Pt reports she feels a little woozy today - backed off at [employer] and did not participate as much; may be due to fatigue, with having a busy week Pt accompanied by: self  PERTINENT HISTORY: Per chart note Katrina Obrien is a 68 y.o. female with history of diabetes, CAD s/p three-vessel bypass, aortic valve stenosis with mechanical valve replacement, hyperlipidemia, hypertension, PE/DVT on Coumadin , left bundle branch block, hypothyroidism, who presents the emergency department with dizziness.  Per patient report, symptoms started waking her up from sleep late Thursday night, 5 days ago.  She was doing some work when she suddenly felt very lightheaded like she might lose consciousness.  She laid on the couch and her symptoms resolved and she went to bed as normal.  She woke up around 2 AM and felt like she was going to fall out of the bed and vomit.  She tried to walk to the bathroom and had to lay down in the hallway.  She then called 911.  She was brought to the ER and had evaluation, including cardiac workup.  She had a CT scan of her abdomen that was normal as well.  She was sent home after her symptoms were improving with medications given.  PAIN:  Are you having pain? Headache - started last night  PRECAUTIONS: Other: dizziness - fall risk  RED FLAGS: None   WEIGHT BEARING RESTRICTIONS: No  FALLS: Has patient fallen in last 6 months? Yes. Number of falls 1  LIVING ENVIRONMENT: Lives with: lives with their spouse Lives in: House/apartment  PLOF: Independent  PATIENT GOALS: resolve the dizziness - find out what I can do to make it better  OBJECTIVE:  Note: Objective measures were completed at Evaluation unless otherwise noted.  DIAGNOSTIC FINDINGS: IMPRESSION: CT scan Stable since February and normal for age noncontrast CT appearance of the Brain.  COGNITION: Overall cognitive status: Within functional  limits for tasks assessed  POSTURE:  No Significant postural limitations  Cervical ROM:  WNL's  BED MOBILITY:  Independent   TRANSFERS: Assistive device utilized: None  Sit to stand: Complete Independence  GAIT: Gait pattern: WFL Distance walked: 18' Assistive device utilized: None Level of assistance: Modified independence Comments: decreased speed due to dizziness; decreased head turns during amb. Due to dizziness  FUNCTIONAL TESTS:  Pt able to stand Romberg with EO for 30 secs; with EC for 17 secs with moderate postural sway   PATIENT SURVEYS:  DHI to be completed (gave to pt at eval)  VESTIBULAR ASSESSMENT:  GENERAL OBSERVATION: pt is a 68 yr old lady with onset of dizziness that started 6 weeks ago; pt is unable to turn her head  without experiencing moderate to severe dizziness   SYMPTOM BEHAVIOR:  Subjective history: pt reports onset of dizziness 6 weeks ago - had severe vomiting - called 911; MRI and CT scan negative for MI and CVA; pt evaluated by ENT but no etiology of dizziness determined - possibly vestibular neuritis; migraine ruled out.  Pt states she is much improved from initial onset. Is now driving but minimizes head movements  Non-Vestibular symptoms: changes in vision, headaches, tinnitus, nausea/vomiting, migraine symptoms, and tinnitus has gotten louder since dizziness onset; had a migraine on Sunday   Type of dizziness: Blurred Vision, Imbalance (Disequilibrium), Unsteady with head/body turns, Lightheadedness/Faint, World moves, and needs to move slowly  Frequency: mostly constant  Duration: mostly constant   Aggravating factors: Induced by motion: occur when walking, bending down to the ground, turning body quickly, turning head quickly, and sitting in a moving car, Worse with fatigue, Worse in the dark, and worse in grocery store  Relieving factors: head stationary, lying supine, rest, slow movements, and avoid busy/distracting  environments  Progression of symptoms: better  OCULOMOTOR EXAM:  Ocular Alignment: normal  Ocular ROM: No Limitations  Spontaneous Nystagmus: absent  Gaze-Induced Nystagmus: absent  Smooth Pursuits: mild nystagmus noted with horizontal smooth pursuits - c/o moderate dizziness with this testing  Saccades: intact     VESTIBULAR - OCULAR REFLEX:   Slow VOR: Comment: not tested due to c/o dizziness  VOR Cancellation: Comment: c/o dizziness  Head-Impulse Test: HIT Right: positive HIT Left: positive unable to maintain gaze on PT's nose with Rt and Lt HIT  Dynamic Visual Acuity: Not able to be assessed   POSITIONAL TESTING: NT based on pt's symptomology   MOTION SENSITIVITY:  Motion Sensitivity Quotient Intensity: 0 = none, 1 = Lightheaded, 2 = Mild, 3 = Moderate, 4 = Severe, 5 = Vomiting  Intensity  1. Sitting to supine   2. Supine to L side   3. Supine to R side   4. Supine to sitting   5. L Hallpike-Dix   6. Up from L    7. R Hallpike-Dix   8. Up from R    9. Sitting, head tipped to L knee 0  10. Head up from L knee 3  11. Sitting, head tipped to R knee 0  12. Head up from R knee 3  13. Sitting head turns x5 4  14.Sitting head nods x5 4  15. In stance, 180 turn to L    16. In stance, 180 turn to R                                                                                                                               TREATMENT DATE: 02-11-24  Pt continues to ambulate into clinic without use of SPC  NeuroRe-ed: Sensory Organization Test:  composite score 81/100 (N=70/100)  Somatosensory, visual and vestibular inputs WNL's;  Preference WNL's  Condition 1 - all 3 trials WNL's Condition  2 - all 3 trials WNL's Condition 3 - all 3 trials WNL's Condition 4 -  trial 1 below normal (slightly); trials 2 & 3 WNL's Condition 5 - all 3 trials WNL's Condition 6 - all 3 trials WNL's  Explained results of SOT to patient -copy of test given - pt verbalized  understanding  TherAct:   Pt amb. 35' making circles clockwise with ball - tracking ball with eyes and head moving for improved gaze stabilization; amb. 35' making circles CCW with ball - tracking ball with eyes and head moving for improved gaze stabilization during dynamic gait     Standing on inverted Bosu inside // bars - with UE support - performed weight shifts anterior/posteriorly 10 reps, then 10 reps laterally with bil. UE support Squats 10 reps with EC on Bosu with bil. UE support on // bars  Pt performed picking up 3 objects off patterned cloth (black & white checkered cloth)  - bend down, picked up each object individually and then turned 180 degrees to place on other end of cloth - for habituation and for improved gaze stabilization with increased visual input    ---------------------------------- Issued these exercises for HEP   Med[employer]:  Access Code: [facility] URL: [email or web address] Date: 01/08/2024 Prepared by: Rock Kussmaul  Exercises - Standing on foam pad - Eyes open and then with Eyes closed   - 1 x daily - 7 x weekly - 1 sets - 2 reps - Romberg Stance on Foam Pad  - 1 x daily - 7 x weekly - 1 sets - 2 reps - Romberg Stance Eyes Closed on Foam Pad  - 1 x daily - 7 x weekly - 1 sets - 2 reps   --Added marching on FLOOR with EO and EC and with head turns to HEP - no handout given as pt stated not needed    Self Treatment for Left Posterior / Anterior Canalithiasis    Sitting on bed: 1. Turn head 45 left. (a) Lie back slowly, shoulders on pillow, head on bed. (b) Hold _20___ seconds. 2. Keeping head on bed, turn head 90 right. Hold _20___ seconds. 3. Roll to right, head on 45 angle down toward bed. Hold _30___ seconds. 4. Sit up on right side of bed. Repeat __3__ times per session. Do __2__ sessions per day.   PATIENT EDUCATION: Education details: Amb. With EC along counter at home - 3 reps, 1x /daily - use UE support prn for balance  recovery Person educated: Patient Education method: Explanation, Demonstration, and Handouts Education comprehension: verbalized understanding and needs further education  HOME EXERCISE PROGRAM: x1 viewing exercise  GOALS: Goals reviewed with patient? Yes  SHORT TERM GOALS: Target date: 01-29-24  Pt will perform x1 viewing exercise in standing position for 60 secs both horizontal and vertical directions with c/o dizziness </= 4/10 intensity to improve gaze stabilization. Baseline: Goal status: Goal met 01-28-24  2.  Assess DVA and establish LTG. Baseline: Goal met 01-05-24  3.  Improve DHI score by at least 14 points to demo improvement in dizziness. Baseline: to be completed; score 48% on 01-05-24 Goal status: Ongoing -01-28-24  4.  Assess mCTSIB and establish LTG. (Maintain balance on condition 4 for 30 secs to demo increased vestibular input in maintaining balance - 01-05-24) Baseline:  Goal status: Goal met 01-28-24  5.  Amb. 30' with horizontal head turns with minimal postural instability with c/o dizziness </= 5/10 intensity. Baseline: no dizziness, just minimal unsteadiness -- 01-28-24 Goal status:  Goal met 01-28-24  6.  Independent in HEP for balance and vestibular exercises. Baseline:  Goal status: Goal met 01-28-24  LONG TERM GOALS: Target date: 02-26-23  Improve DVA to </= 3 line difference for improved gaze stabilization.; Revised -- </= 2 line difference with no c/o dizziness upon test completion Baseline:  Goal status: Goal met 01-28-24  2.  Improve DHI score by at least 50% of initial score. (24%) Baseline: 48% (01-05-24) Goal status: INITIAL  3.  Pt will amb. 96' with horizontal head turns without LOB with c/o dizziness </= 3/10.  Baseline:  Goal status: INITIAL  4.  Improve FGA score by at least 8 points to demo improved balance and reduced fall risk with ambulation. Baseline: 18/30 Goal status: INITIAL  5.  Demonstrate increased vestibular input by standing on  compliant surface with EC for 30 secs without LOB. Baseline:  Goal status: INITIAL  6.  Independent in updated HEP for balance and vestibular exercises. Baseline:  Goal status: INITIAL  7. Report at least 50% improvement in headaches for increased ease with ADL's and mobility and caregiving tasks.            Baseline: c/o moderate headaches daily           Goal status:  INITIAL  ASSESSMENT:  CLINICAL IMPRESSION: PT session focused on assessment of SOT and visual/vestibular exercises to improve gaze stabilization and balance.  Pt's composite score on SOT is WNL's at 81/100, with N= 70/100.  Somatosensory, visual and vestibular inputs are all WNL's.  Pt did report mild dizziness with exiting Balance Master upon completion of SOT.  Pt tolerated habituation/gaze stabilization exercises with use of busy patterned cloth on floor - dizziness was provoked but pt able to take short standing rest break for few seconds and then resume activity.  Pt is progressing well towards LTG's.  Cont with POC.   OBJECTIVE IMPAIRMENTS: decreased balance, difficulty walking, dizziness, and headaches.   ACTIVITY LIMITATIONS: bending, squatting, transfers, bathing, locomotion level, and caring for others  PARTICIPATION LIMITATIONS: meal prep, cleaning, laundry, driving, shopping, community activity, and yard work  PERSONAL FACTORS: 1 comorbidity: h/o mechanical valve replacement and dizziness of unknown etiology  are also affecting patient's functional outcome.   REHAB POTENTIAL: Good  CLINICAL DECISION MAKING: Evolving/moderate complexity  EVALUATION COMPLEXITY: Moderate   PLAN:  PT FREQUENCY: 1-2x/week  PT DURATION: 8 weeks + eval  PLANNED INTERVENTIONS: 97110-Therapeutic exercises, 97530- Therapeutic activity, [id number]- Neuromuscular re-education, [phone]- Self Care, [id number]- Gait training, [phone]- Aquatic Therapy, Patient/Family education, and Vestibular training  PLAN FOR NEXT SESSION:   Cont with balance  and vestibular exercises - walking on mat, etc.    Allesandra Huebsch, [place], PT 02/13/2024, 5:18 PM

## 2024-02-13 ENCOUNTER — Encounter: Payer: Self-pay | Admitting: Physical Therapy

## 2024-02-22 ENCOUNTER — Ambulatory Visit: Attending: Internal Medicine

## 2024-02-22 DIAGNOSIS — Z7901 Long term (current) use of anticoagulants: Secondary | ICD-10-CM | POA: Diagnosis not present

## 2024-02-22 LAB — POCT INR: INR: 3.2 — AB (ref 2.0–3.0)

## 2024-02-22 NOTE — Progress Notes (Signed)
 INR 3.2. Please see anticoagulation encounter

## 2024-02-22 NOTE — Patient Instructions (Addendum)
 Description   Continue taking warfarin 1 tablet daily except 1.5 tablets each Monday.  Repeat INR in 6 weeks. Coumadin  Clinic [phone]

## 2024-02-23 ENCOUNTER — Ambulatory Visit: Payer: Self-pay | Admitting: Physical Therapy

## 2024-02-23 DIAGNOSIS — R42 Dizziness and giddiness: Secondary | ICD-10-CM | POA: Diagnosis not present

## 2024-02-23 DIAGNOSIS — R2681 Unsteadiness on feet: Secondary | ICD-10-CM

## 2024-02-23 NOTE — Therapy (Unsigned)
 OUTPATIENT PHYSICAL THERAPY VESTIBULAR TREATMENT NOTE     Patient Name: Katrina Obrien MRN: [id number] DOB:1955/12/28, 68 y.o., female Today's Date: 02/24/2024  END OF SESSION:  PT End of Session - 02/24/24 1622     Visit Number 9    Number of Visits 17    Date for PT Re-Evaluation 02/26/24    Authorization Type [employer]    Authorization Time Period 12-31-23 - 03-04-24    Authorization - Visit Number 9    Authorization - Number of Visits 17    Progress Note Due on Visit 10    PT Start Time 0832    PT Stop Time 0930    PT Time Calculation (min) 58 min    Equipment Utilized During Treatment --    Activity Tolerance Patient tolerated treatment well    Behavior During Therapy WFL for tasks assessed/performed                  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   Clotting disorder (HCC)    anticoagulant - coumadin  hx DVT, PE   Coronary artery disease    s/p 3- vessel bypass    Diabetes mellitus without complication (HCC)    DVT (deep venous thrombosis) (HCC)    Heart murmur    Hyperlipidemia    Hypertension    Ischemic cardiomyopathy    40 to 45%   Left bundle branch block    Mechanical heart valve present    Jan 2012   Popliteal artery occlusion, left (HCC)    recent  status  post angioplasty   Pulmonary embolism (HCC)    associated with DVT   Pulmonary embolus (HCC)    Thyroid  disease    hyperthyroidism   Past Surgical History:  Procedure Laterality Date   AORTIC VALVE REPLACEMENT (AVR)/CORONARY ARTERY BYPASS GRAFTING (CABG)  08/16/2010  VAN TRIGT   AV replacement w/19-mm mechanical St Jude valve ([id number]),CABGX3  lima to LAD, SVG to distal circ,SVG to posterior descending   ASD REPAIR     CARDIAC CATHETERIZATION  08/08/2010   ,LAD 50% PROX,605 TANDEM SEGMENTAL STENOSIS,LEFT CIRC 60%-AV GROOVE, RGT COR 40% PROX ON THE  BEND ,80% PROX PDA,   CARDIAC VALVE REPLACEMENT  08/16/2010   CORONARY ARTERY BYPASS GRAFT  08/16/2010   LIMA TO lad,svg to distal circ ,svg to posterior descending   DOPPLER ECHOCARDIOGRAPHY  08/09/2010   EF35% to 40% ,bbicuspid ;severely  thickened ,severely calcified leaflets, LV normal   LEA doppler  08/07/2010   left popliteal occlusion ,and anterior tibial complete occlusion   NM MYOCAR PERF WALL MOTION  08/06/2010   EF 31%  LOW RISK SCAN   nuc     PV ANGIOGRAM  08/08/2010   successful PTA LEFT POPLITEAL   TUBAL LIGATION  1987   Patient Active Problem List   Diagnosis Date Noted   Headache 01/22/2024   Vertigo 11/26/2023   Blood in stool 11/26/2023   Osteoporosis 09/21/2023   Head injury 09/14/2023   Rib pain 09/14/2023   Restless leg syndrome 02/16/2023   Plantar fasciitis of right foot 06/16/2022   Caregiver stress syndrome 03/08/2019   Vitamin D  insufficiency 09/13/2018   White coat syndrome with diagnosis of hypertension 09/13/2018   Hyperlipidemia associated with type 2 diabetes mellitus (HCC) 02/03/2018   Hypothyroidism 02/03/2018   LBBB (left bundle branch block) 04/06/2014  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   Aortic stenosis 01/26/2013   S/P AVR 01/26/2013   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   Hypertension associated with diabetes (HCC) 01/26/2013   Familial hyperlipidemia 01/26/2013   Cardiomyopathy, ischemic 01/26/2013   DVT (deep venous thrombosis) (HCC) 01/26/2013   Pulmonary embolus (HCC) 01/26/2013   Uncontrolled type 2 diabetes mellitus with hyperglycemia (HCC) 01/26/2013   Long term (current) use of anticoagulants 10/11/2012    PCP: Chandra Toribio POUR., MD REFERRING PROVIDER: Palmer Purchase, PA-C  REFERRING DIAG:  Diagnosis  R42 (ICD-10-CM) - Vertigo    THERAPY DIAG:  Unsteadiness on feet  Dizziness and giddiness  ONSET DATE: 11-19-23  Rationale for Evaluation and Treatment: Rehabilitation  SUBJECTIVE:   SUBJECTIVE  STATEMENT: Pt reports she has been doing really well[place] and did fine.  Reports no dizziness at this time Pt accompanied by: self  PERTINENT HISTORY: Per chart note Katrina Obrien is a 68 y.o. female with history of diabetes, CAD s/p three-vessel bypass, aortic valve stenosis with mechanical valve replacement, hyperlipidemia, hypertension, PE/DVT on Coumadin , left bundle branch block, hypothyroidism, who presents the emergency department with dizziness.  Per patient report, symptoms started waking her up from sleep late Thursday night, 5 days ago.  She was doing some work when she suddenly felt very lightheaded like she might lose consciousness.  She laid on the couch and her symptoms resolved and she went to bed as normal.  She woke up around 2 AM and felt like she was going to fall out of the bed and vomit.  She tried to walk to the bathroom and had to lay down in the hallway.  She then called 911.  She was brought to the ER and had evaluation, including cardiac workup.  She had a CT scan of her abdomen that was normal as well.  She was sent home after her symptoms were improving with medications given.  PAIN:  Are you having pain? Headache - started last night  PRECAUTIONS: Other: dizziness - fall risk  RED FLAGS: None   WEIGHT BEARING RESTRICTIONS: No  FALLS: Has patient fallen in last 6 months? Yes. Number of falls 1  LIVING ENVIRONMENT: Lives with: lives with their spouse Lives in: House/apartment  PLOF: Independent  PATIENT GOALS: resolve the dizziness - find out what I can do to make it better  OBJECTIVE:  Note: Objective measures were completed at Evaluation unless otherwise noted.  DIAGNOSTIC FINDINGS: IMPRESSION: CT scan Stable since February and normal for age noncontrast CT appearance of the Brain.  COGNITION: Overall cognitive status: Within functional limits for tasks assessed  POSTURE:  No Significant postural  limitations  Cervical ROM:  WNL's  BED MOBILITY:  Independent   TRANSFERS: Assistive device utilized: None  Sit to stand: Complete Independence  GAIT: Gait pattern: WFL Distance walked: 48' Assistive device utilized: None Level of assistance: Modified independence Comments: decreased speed due to dizziness; decreased head turns during amb. Due to dizziness  FUNCTIONAL TESTS:  Pt able to stand Romberg with EO for 30 secs; with EC for 17 secs with moderate postural sway   PATIENT SURVEYS:  DHI to be completed (gave to pt at eval)  VESTIBULAR ASSESSMENT:  GENERAL OBSERVATION: pt is a 68 yr old lady with onset of dizziness that started 6 weeks ago; pt is unable to turn her head without experiencing moderate to severe dizziness   SYMPTOM BEHAVIOR:  Subjective history: pt reports  onset of dizziness 6 weeks ago - had severe vomiting - called 911; MRI and CT scan negative for MI and CVA; pt evaluated by ENT but no etiology of dizziness determined - possibly vestibular neuritis; migraine ruled out.  Pt states she is much improved from initial onset. Is now driving but minimizes head movements  Non-Vestibular symptoms: changes in vision, headaches, tinnitus, nausea/vomiting, migraine symptoms, and tinnitus has gotten louder since dizziness onset; had a migraine on Sunday   Type of dizziness: Blurred Vision, Imbalance (Disequilibrium), Unsteady with head/body turns, Lightheadedness/Faint, World moves, and needs to move slowly  Frequency: mostly constant  Duration: mostly constant   Aggravating factors: Induced by motion: occur when walking, bending down to the ground, turning body quickly, turning head quickly, and sitting in a moving car, Worse with fatigue, Worse in the dark, and worse in grocery store  Relieving factors: head stationary, lying supine, rest, slow movements, and avoid busy/distracting environments  Progression of symptoms: better  OCULOMOTOR EXAM:  Ocular Alignment:  normal  Ocular ROM: No Limitations  Spontaneous Nystagmus: absent  Gaze-Induced Nystagmus: absent  Smooth Pursuits: mild nystagmus noted with horizontal smooth pursuits - c/o moderate dizziness with this testing  Saccades: intact     VESTIBULAR - OCULAR REFLEX:   Slow VOR: Comment: not tested due to c/o dizziness  VOR Cancellation: Comment: c/o dizziness  Head-Impulse Test: HIT Right: positive HIT Left: positive unable to maintain gaze on PT's nose with Rt and Lt HIT  Dynamic Visual Acuity: Not able to be assessed   POSITIONAL TESTING: NT based on pt's symptomology   MOTION SENSITIVITY:  Motion Sensitivity Quotient Intensity: 0 = none, 1 = Lightheaded, 2 = Mild, 3 = Moderate, 4 = Severe, 5 = Vomiting  Intensity  1. Sitting to supine   2. Supine to L side   3. Supine to R side   4. Supine to sitting   5. L Hallpike-Dix   6. Up from L    7. R Hallpike-Dix   8. Up from R    9. Sitting, head tipped to L knee 0  10. Head up from L knee 3  11. Sitting, head tipped to R knee 0  12. Head up from R knee 3  13. Sitting head turns x5 4  14.Sitting head nods x5 4  15. In stance, 180 turn to L    16. In stance, 180 turn to R                                                                                                                               TREATMENT DATE: 02-23-24  Pt continues to ambulate into clinic without use of SPC  Gait:      Functional Gait  Assessment  Gait Level Surface   Change in Gait Speed   Gait with Horizontal Head Turns   Gait with Vertical Head Turns   Gait and Pivot Turn   Step  Over Obstacle   Gait with Narrow Base of Support   Gait with Eyes Closed   Ambulating Backwards   Steps   Total Score     NeuroRe-ed: SVA - line 8:  DVA line 7 with mild c/o dizziness upon completion of test  X1 viewing during ambulation - 30' x 2 reps X2 viewing with letter for target - 30' x 2 reps  Pt amb. Reading called lines of colored Shiela chart for  improved gaze stabilization during gait  Pt amb. 35' making circles clockwise with ball - tracking ball with eyes and head moving for improved gaze stabilization; amb. 35' making circles CCW with ball - tracking ball with eyes and head moving for improved gaze stabilization during dynamic gait   Rockerboard inside // bars - 10 reps EO:  10 reps EC;  progressed to reading called lines/letters on colored Hart chart while standing on rockerboard without UE support  TherAct:    Pt performed picking 5# kettlebell off patterned cloth (black & white checkered cloth)  - bend down, picked up each object individually and then turned 180 degrees to place on other end of cloth - for habituation and for improved gaze stabilization with increased visual input  Pt performed stepping/marching on trampoline - tossed and caught red medicine ball 5 reps straight up in front:  then tossed on Rt/Lt sides and caught ball 5 reps each;   progressed to turning 360 degrees 1 rep to Lt side, 1 rep to Rt side - dizziness provoked with this exercise   Discussed LTG's  and progress - plan D/C next visit ---------------------------------- Issued these exercises for HEP   Medbridge:  Access Code: [facility] URL: [email or web address] Date: 01/08/2024 Prepared by: Rock Kussmaul  Exercises - Standing on foam pad - Eyes open and then with Eyes closed   - 1 x daily - 7 x weekly - 1 sets - 2 reps - Romberg Stance on Foam Pad  - 1 x daily - 7 x weekly - 1 sets - 2 reps - Romberg Stance Eyes Closed on Foam Pad  - 1 x daily - 7 x weekly - 1 sets - 2 reps   --Added marching on FLOOR with EO and EC and with head turns to HEP - no handout given as pt stated not needed    Self Treatment for Left Posterior / Anterior Canalithiasis    Sitting on bed: 1. Turn head 45 left. (a) Lie back slowly, shoulders on pillow, head on bed. (b) Hold _20___ seconds. 2. Keeping head on bed, turn head 90 right. Hold _20___ seconds.  3. Roll to right, head on 45 angle down toward bed. Hold _30___ seconds. 4. Sit up on right side of bed. Repeat __3__ times per session. Do __2__ sessions per day.   PATIENT EDUCATION: Education details: Amb. With EC along counter at home - 3 reps, 1x /daily - use UE support prn for balance recovery Person educated: Patient Education method: Explanation, Demonstration, and Handouts Education comprehension: verbalized understanding and needs further education  HOME EXERCISE PROGRAM: x1 viewing exercise  GOALS: Goals reviewed with patient? Yes  SHORT TERM GOALS: Target date: 01-29-24  Pt will perform x1 viewing exercise in standing position for 60 secs both horizontal and vertical directions with c/o dizziness </= 4/10 intensity to improve gaze stabilization. Baseline: Goal status: Goal met 01-28-24  2.  Assess DVA and establish LTG. Baseline: Goal met 01-05-24  3.  Improve DHI score by at least 14 points  to demo improvement in dizziness. Baseline: to be completed; score 48% on 01-05-24 Goal status: Ongoing -01-28-24  4.  Assess mCTSIB and establish LTG. (Maintain balance on condition 4 for 30 secs to demo increased vestibular input in maintaining balance - 01-05-24) Baseline:  Goal status: Goal met 01-28-24  5.  Amb. 30' with horizontal head turns with minimal postural instability with c/o dizziness </= 5/10 intensity. Baseline: no dizziness, just minimal unsteadiness -- 01-28-24 Goal status: Goal met 01-28-24  6.  Independent in HEP for balance and vestibular exercises. Baseline:  Goal status: Goal met 01-28-24  LONG TERM GOALS: Target date: 02-26-23  Improve DVA to </= 3 line difference for improved gaze stabilization.; Revised -- </= 2 line difference with no c/o dizziness upon test completion Baseline:  Goal status: Goal met 01-28-24;  Goal partially met 02-22-24 as pt reports minimal dizziness  2.  Improve DHI score by at least 50% of initial score. (24%) Baseline: 48% (01-05-24) Goal  status: INITIAL  3.  Pt will amb. 61' with horizontal head turns without LOB with c/o dizziness </= 3/10.  Baseline:  Goal status: INITIAL  4.  Improve FGA score by at least 8 points to demo improved balance and reduced fall risk with ambulation. Baseline: 18/30; score 30/30 Goal status: Goal met 02-23-24  5.  Demonstrate increased vestibular input by standing on compliant surface with EC for 30 secs without LOB. Baseline:  Goal status: INITIAL  6.  Independent in updated HEP for balance and vestibular exercises. Baseline:  Goal status: INITIAL  7. Report at least 50% improvement in headaches for increased ease with ADL's and mobility and caregiving tasks.            Baseline: c/o moderate headaches daily           Goal status:  Goal met 02-23-24  ASSESSMENT:  CLINICAL IMPRESSION: Pt has met LTG's #4 and 7:  FGA score has increased from 18/30 to 30/30 in today's session.  Pt's DVA WNL's with a 1 line difference with minimal c/o dizziness upon test completion.  Pt continues to be challenged by visual vestibular exercises with increased visual input provoking mild dizziness.  Pt has made excellent progress - plan D/C next session.  Cont with POC.   OBJECTIVE IMPAIRMENTS: decreased balance, difficulty walking, dizziness, and headaches.   ACTIVITY LIMITATIONS: bending, squatting, transfers, bathing, locomotion level, and caring for others  PARTICIPATION LIMITATIONS: meal prep, cleaning, laundry, driving, shopping, community activity, and yard work  PERSONAL FACTORS: 1 comorbidity: h/o mechanical valve replacement and dizziness of unknown etiology  are also affecting patient's functional outcome.   REHAB POTENTIAL: Good  CLINICAL DECISION MAKING: Evolving/moderate complexity  EVALUATION COMPLEXITY: Moderate   PLAN:  PT FREQUENCY: 1-2x/week  PT DURATION: 8 weeks + eval  PLANNED INTERVENTIONS: 97110-Therapeutic exercises, 97530- Therapeutic activity, 97112- Neuromuscular  re-education, [phone]- Self Care, [id number]- Gait training, [phone]- Aquatic Therapy, Patient/Family education, and Vestibular training  PLAN FOR NEXT SESSION:   DHI to be calculated - finish checking LTG's:  Cont with balance and vestibular exercises - walking on mat, etc.    Cheyenne Schumm, [place], PT 02/24/2024, 4:26 PM

## 2024-02-24 ENCOUNTER — Encounter: Payer: Self-pay | Admitting: Physical Therapy

## 2024-02-24 NOTE — Progress Notes (Signed)
   02/23/24 0001  Functional Gait  Assessment  Gait Level Surface 3 (5.25)  Change in Gait Speed 3  Gait with Horizontal Head Turns 3  Gait with Vertical Head Turns 3  Gait and Pivot Turn 3  Step Over Obstacle 3  Gait with Narrow Base of Support 3  Gait with Eyes Closed 3  Ambulating Backwards 3  Steps 3  Total Score 30

## 2024-03-03 ENCOUNTER — Ambulatory Visit: Payer: Self-pay | Attending: Physician Assistant | Admitting: Physical Therapy

## 2024-03-03 DIAGNOSIS — R42 Dizziness and giddiness: Secondary | ICD-10-CM | POA: Diagnosis present

## 2024-03-03 DIAGNOSIS — R2681 Unsteadiness on feet: Secondary | ICD-10-CM | POA: Insufficient documentation

## 2024-03-03 NOTE — Therapy (Unsigned)
 OUTPATIENT PHYSICAL THERAPY VESTIBULAR TREATMENT NOTE/10th VISIT PROGRESS NOTE/DISCHARGE SUMMARY   Progress Note Reporting Period 12-31-23 to 03-03-24  See note below for Objective Data and Assessment of Progress/Goals.      Patient Name: Katrina Obrien MRN: [id number] DOB:1956-04-05, 68 y.o., female Today's Date: 03/03/2024  END OF SESSION:  PT End of Session - 03/04/24 1951     Visit Number 10    Number of Visits 17    Date for PT Re-Evaluation 02/26/24    Authorization Type [employer]    Authorization Time Period 12-31-23 - 03-04-24    Authorization - Visit Number 10    Authorization - Number of Visits 17    Progress Note Due on Visit 10    PT Start Time 0758    PT Stop Time 0830    PT Time Calculation (min) 32 min    Activity Tolerance Patient tolerated treatment well    Behavior During Therapy WFL for tasks assessed/performed                   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   Clotting disorder (HCC)    anticoagulant - coumadin  hx DVT, PE   Coronary artery disease    s/p 3- vessel bypass    Diabetes mellitus without complication (HCC)    DVT (deep venous thrombosis) (HCC)    Heart murmur    Hyperlipidemia    Hypertension    Ischemic cardiomyopathy    40 to 45%   Left bundle branch block    Mechanical heart valve present    Jan 2012   Popliteal artery occlusion, left (HCC)    recent  status  post angioplasty   Pulmonary embolism (HCC)    associated with DVT   Pulmonary embolus (HCC)    Thyroid  disease    hyperthyroidism   Past Surgical History:  Procedure Laterality Date   AORTIC VALVE REPLACEMENT (AVR)/CORONARY ARTERY BYPASS GRAFTING (CABG)  08/16/2010  VAN TRIGT   AV replacement w/19-mm mechanical St Jude valve ([id number]),CABGX3  lima to LAD, SVG to distal circ,SVG to posterior descending   ASD REPAIR      CARDIAC CATHETERIZATION  08/08/2010   ,LAD 50% PROX,605 TANDEM SEGMENTAL STENOSIS,LEFT CIRC 60%-AV GROOVE, RGT COR 40% PROX ON THE BEND ,80% PROX PDA,   CARDIAC VALVE REPLACEMENT  08/16/2010   CORONARY ARTERY BYPASS GRAFT  08/16/2010   LIMA TO lad,svg to distal circ ,svg to posterior descending   DOPPLER ECHOCARDIOGRAPHY  08/09/2010   EF35% to 40% ,bbicuspid ;severely  thickened ,severely calcified leaflets, LV normal   LEA doppler  08/07/2010   left popliteal occlusion ,and anterior tibial complete occlusion   NM MYOCAR PERF WALL MOTION  08/06/2010   EF 31%  LOW RISK SCAN   nuc     PV ANGIOGRAM  08/08/2010   successful PTA LEFT POPLITEAL   TUBAL LIGATION  1987   Patient Active Problem List   Diagnosis Date Noted   Headache 01/22/2024   Vertigo 11/26/2023   Blood in stool 11/26/2023   Osteoporosis 09/21/2023   Head injury 09/14/2023   Rib pain 09/14/2023   Restless leg syndrome 02/16/2023   Plantar fasciitis of right foot 06/16/2022   Caregiver stress syndrome 03/08/2019   Vitamin D  insufficiency 09/13/2018   White coat syndrome with diagnosis of hypertension 09/13/2018   Hyperlipidemia associated with  type 2 diabetes mellitus (HCC) 02/03/2018   Hypothyroidism 02/03/2018   LBBB (left bundle branch block) 04/06/2014   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   Aortic stenosis 01/26/2013   S/P AVR 01/26/2013   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   Hypertension associated with diabetes (HCC) 01/26/2013   Familial hyperlipidemia 01/26/2013   Cardiomyopathy, ischemic 01/26/2013   DVT (deep venous thrombosis) (HCC) 01/26/2013   Pulmonary embolus (HCC) 01/26/2013   Uncontrolled type 2 diabetes mellitus with hyperglycemia (HCC) 01/26/2013   Long term (current) use of anticoagulants 10/11/2012    PCP: Chandra Toribio POUR., MD REFERRING PROVIDER: Palmer Purchase, PA-C  REFERRING DIAG:  Diagnosis  R42 (ICD-10-CM) - Vertigo    THERAPY DIAG:  Unsteadiness on feet  Dizziness and  giddiness  ONSET DATE: 11-19-23  Rationale for Evaluation and Treatment: Rehabilitation  SUBJECTIVE:   SUBJECTIVE STATEMENT: Pt reports she has been doing really well[place] and did fine.  Reports no dizziness at this time Pt accompanied by: self  PERTINENT HISTORY: Per chart note Katrina Obrien is a 68 y.o. female with history of diabetes, CAD s/p three-vessel bypass, aortic valve stenosis with mechanical valve replacement, hyperlipidemia, hypertension, PE/DVT on Coumadin , left bundle branch block, hypothyroidism, who presents the emergency department with dizziness.  Per patient report, symptoms started waking her up from sleep late Thursday night, 5 days ago.  She was doing some work when she suddenly felt very lightheaded like she might lose consciousness.  She laid on the couch and her symptoms resolved and she went to bed as normal.  She woke up around 2 AM and felt like she was going to fall out of the bed and vomit.  She tried to walk to the bathroom and had to lay down in the hallway.  She then called 911.  She was brought to the ER and had evaluation, including cardiac workup.  She had a CT scan of her abdomen that was normal as well.  She was sent home after her symptoms were improving with medications given.  PAIN:  Are you having pain? Headache - started last night  PRECAUTIONS: Other: dizziness - fall risk  RED FLAGS: None   WEIGHT BEARING RESTRICTIONS: No  FALLS: Has patient fallen in last 6 months? Yes. Number of falls 1  LIVING ENVIRONMENT: Lives with: lives with their spouse Lives in: House/apartment  PLOF: Independent  PATIENT GOALS: resolve the dizziness - find out what I can do to make it better  OBJECTIVE:  Note: Objective measures were completed at Evaluation unless otherwise noted.  DIAGNOSTIC FINDINGS: IMPRESSION: CT scan Stable since February and normal for age noncontrast CT appearance of the  Brain.  COGNITION: Overall cognitive status: Within functional limits for tasks assessed  POSTURE:  No Significant postural limitations  Cervical ROM:  WNL's  BED MOBILITY:  Independent   TRANSFERS: Assistive device utilized: None  Sit to stand: Complete Independence  GAIT: Gait pattern: WFL Distance walked: 45' Assistive device utilized: None Level of assistance: Modified independence Comments: decreased speed due to dizziness; decreased head turns during amb. Due to dizziness  FUNCTIONAL TESTS:  Pt able to stand Romberg with EO for 30 secs; with EC for 17 secs with moderate postural sway   PATIENT SURVEYS:  DHI to be completed (gave to pt at eval)  VESTIBULAR ASSESSMENT:  GENERAL OBSERVATION: pt is a 68 yr old lady with onset of dizziness that started 6 weeks ago; pt is  unable to turn her head without experiencing moderate to severe dizziness   SYMPTOM BEHAVIOR:  Subjective history: pt reports onset of dizziness 6 weeks ago - had severe vomiting - called 911; MRI and CT scan negative for MI and CVA; pt evaluated by ENT but no etiology of dizziness determined - possibly vestibular neuritis; migraine ruled out.  Pt states she is much improved from initial onset. Is now driving but minimizes head movements  Non-Vestibular symptoms: changes in vision, headaches, tinnitus, nausea/vomiting, migraine symptoms, and tinnitus has gotten louder since dizziness onset; had a migraine on Sunday   Type of dizziness: Blurred Vision, Imbalance (Disequilibrium), Unsteady with head/body turns, Lightheadedness/Faint, World moves, and needs to move slowly  Frequency: mostly constant  Duration: mostly constant   Aggravating factors: Induced by motion: occur when walking, bending down to the ground, turning body quickly, turning head quickly, and sitting in a moving car, Worse with fatigue, Worse in the dark, and worse in grocery store  Relieving factors: head stationary, lying supine, rest,  slow movements, and avoid busy/distracting environments  Progression of symptoms: better  OCULOMOTOR EXAM:  Ocular Alignment: normal  Ocular ROM: No Limitations  Spontaneous Nystagmus: absent  Gaze-Induced Nystagmus: absent  Smooth Pursuits: mild nystagmus noted with horizontal smooth pursuits - c/o moderate dizziness with this testing  Saccades: intact     VESTIBULAR - OCULAR REFLEX:   Slow VOR: Comment: not tested due to c/o dizziness  VOR Cancellation: Comment: c/o dizziness  Head-Impulse Test: HIT Right: positive HIT Left: positive unable to maintain gaze on PT's nose with Rt and Lt HIT  Dynamic Visual Acuity: Not able to be assessed   POSITIONAL TESTING: NT based on pt's symptomology   MOTION SENSITIVITY:  Motion Sensitivity Quotient Intensity: 0 = none, 1 = Lightheaded, 2 = Mild, 3 = Moderate, 4 = Severe, 5 = Vomiting  Intensity  1. Sitting to supine   2. Supine to L side   3. Supine to R side   4. Supine to sitting   5. L Hallpike-Dix   6. Up from L    7. R Hallpike-Dix   8. Up from R    9. Sitting, head tipped to L knee 0  10. Head up from L knee 3  11. Sitting, head tipped to R knee 0  12. Head up from R knee 3  13. Sitting head turns x5 4  14.Sitting head nods x5 4  15. In stance, 180 turn to L    16. In stance, 180 turn to R                                                                                                                               TREATMENT DATE: 03-03-24   NeuroRe-ed: SVA - line 8:  DVA line 7 with very minimal c/o dizziness upon completion of test - subsided within 3-4 seconds  Pt stood on Airex with feet together with EC  for 30 secs (for LTG assessment)  Pt performed amb. On floor and then on blue mat in darkened room for increased vestibular input in maintaining balance - no UE support on counter needed for assist with balance  SLS - 10 secs on each leg    Self care:   DHI calculated - score 10% (sometimes x 5)  Reviewed  LTG's and progress; reviewed HEP     ---------------------------------- Issued these exercises for HEP   Medbridge:  Access Code: [facility] URL: [email or web address] Date: 01/08/2024 Prepared by: Rock Kussmaul  Exercises - Standing on foam pad - Eyes open and then with Eyes closed   - 1 x daily - 7 x weekly - 1 sets - 2 reps - Romberg Stance on Foam Pad  - 1 x daily - 7 x weekly - 1 sets - 2 reps - Romberg Stance Eyes Closed on Foam Pad  - 1 x daily - 7 x weekly - 1 sets - 2 reps   --Added marching on FLOOR with EO and EC and with head turns to HEP - no handout given as pt stated not needed    Self Treatment for Left Posterior / Anterior Canalithiasis    Sitting on bed: 1. Turn head 45 left. (a) Lie back slowly, shoulders on pillow, head on bed. (b) Hold _20___ seconds. 2. Keeping head on bed, turn head 90 right. Hold _20___ seconds. 3. Roll to right, head on 45 angle down toward bed. Hold _30___ seconds. 4. Sit up on right side of bed. Repeat __3__ times per session. Do __2__ sessions per day.   PATIENT EDUCATION: Education details: Amb. With EC along counter at home - 3 reps, 1x /daily - use UE support prn for balance recovery Person educated: Patient Education method: Explanation, Demonstration, and Handouts Education comprehension: verbalized understanding and needs further education  HOME EXERCISE PROGRAM: x1 viewing exercise  GOALS: Goals reviewed with patient? Yes  SHORT TERM GOALS: Target date: 01-29-24  Pt will perform x1 viewing exercise in standing position for 60 secs both horizontal and vertical directions with c/o dizziness </= 4/10 intensity to improve gaze stabilization. Baseline: Goal status: Goal met 01-28-24  2.  Assess DVA and establish LTG. Baseline: Goal met 01-05-24  3.  Improve DHI score by at least 14 points to demo improvement in dizziness. Baseline: to be completed; score 48% on 01-05-24 Goal status: Ongoing -01-28-24  4.  Assess  mCTSIB and establish LTG. (Maintain balance on condition 4 for 30 secs to demo increased vestibular input in maintaining balance - 01-05-24) Baseline:  Goal status: Goal met 01-28-24  5.  Amb. 30' with horizontal head turns with minimal postural instability with c/o dizziness </= 5/10 intensity. Baseline: no dizziness, just minimal unsteadiness -- 01-28-24 Goal status: Goal met 01-28-24  6.  Independent in HEP for balance and vestibular exercises. Baseline:  Goal status: Goal met 01-28-24  LONG TERM GOALS: Target date: 02-26-23  Improve DVA to </= 3 line difference for improved gaze stabilization.; Revised -- </= 2 line difference with no c/o dizziness upon test completion Baseline:  Goal status: Goal met 03-03-24 with 1 line difference  2.  Improve DHI score by at least 50% of initial score. (24%) Baseline: 48% (01-05-24):  10%  Goal status: Goal met 03-03-24  3.  Pt will amb. 77' with horizontal head turns without LOB with c/o dizziness </= 3/10.  Baseline:  Goal status: Goal met 03-03-24  4.  Improve FGA score by at least 8  points to demo improved balance and reduced fall risk with ambulation. Baseline: 18/30; score 30/30 Goal status: Goal met 02-23-24  5.  Demonstrate increased vestibular input by standing on compliant surface with EC for 30 secs without LOB. Baseline:  Goal status: Goal met 03-03-24  6.  Independent in updated HEP for balance and vestibular exercises. Baseline:  Goal status: Goal met 03-03-24  7. Report at least 50% improvement in headaches for increased ease with ADL's and mobility and caregiving tasks.            Baseline: c/o moderate headaches daily           Goal status:  Goal met 02-23-24  ASSESSMENT:  CLINICAL IMPRESSION: This 10th visit progress note covers dates 12-31-23 - 03-03-24.  Pt has met 7/7 LTG's.  DHI score has improved from 48% to 10% with pt reporting minimal to no dizziness at this time.  DVA is WNL's with a 1 line difference.  Pt is discharged due to all  goals achieved.  Pt agrees with D/C at this time.   OBJECTIVE IMPAIRMENTS: decreased balance, difficulty walking, dizziness, and headaches.   ACTIVITY LIMITATIONS: bending, squatting, transfers, bathing, locomotion level, and caring for others  PARTICIPATION LIMITATIONS: meal prep, cleaning, laundry, driving, shopping, community activity, and yard work  PERSONAL FACTORS: 1 comorbidity: h/o mechanical valve replacement and dizziness of unknown etiology  are also affecting patient's functional outcome.   REHAB POTENTIAL: Good  CLINICAL DECISION MAKING: Evolving/moderate complexity  EVALUATION COMPLEXITY: Moderate   PLAN:  PT FREQUENCY: 1-2x/week  PT DURATION: 8 weeks + eval  PLANNED INTERVENTIONS: 97110-Therapeutic exercises, 97530- Therapeutic activity, [id number]- Neuromuscular re-education, [phone]- Self Care, [id number]- Gait training, [phone]- Aquatic Therapy, Patient/Family education, and Vestibular training  PLAN FOR NEXT SESSION:   D/C on 03-03-24   PHYSICAL THERAPY DISCHARGE SUMMARY  Visits from Start of Care: 10  Current functional level related to goals / functional outcomes: See above for progress towards goals - pt has made excellent progress with all LTG's met   Remaining deficits: None regarding balance Pt reports she is most challenged by maintaining balance on compliant surface with EC but all testing is WNL's  Minimal c/o dizziness at this time - DHI score has improved from 48% to 10%   Education / Equipment: Pt has been instructed in HEP for balance/vestibular exercises   Patient agrees to discharge. Patient goals were met. Patient is being discharged due to meeting the stated rehab goals.    R[place]anna [place], PT 03/04/2024, 7:56 PM

## 2024-03-04 ENCOUNTER — Encounter: Payer: Self-pay | Admitting: Physical Therapy

## 2024-03-21 ENCOUNTER — Other Ambulatory Visit: Payer: Self-pay | Admitting: Family Medicine

## 2024-03-21 ENCOUNTER — Other Ambulatory Visit: Payer: Self-pay | Admitting: Internal Medicine

## 2024-03-22 ENCOUNTER — Encounter: Payer: Self-pay | Admitting: Internal Medicine

## 2024-03-22 DIAGNOSIS — E7849 Other hyperlipidemia: Secondary | ICD-10-CM

## 2024-03-22 DIAGNOSIS — Z5181 Encounter for therapeutic drug level monitoring: Secondary | ICD-10-CM

## 2024-03-22 DIAGNOSIS — Z951 Presence of aortocoronary bypass graft: Secondary | ICD-10-CM

## 2024-03-27 ENCOUNTER — Other Ambulatory Visit: Payer: Self-pay

## 2024-03-27 DIAGNOSIS — E1165 Type 2 diabetes mellitus with hyperglycemia: Secondary | ICD-10-CM

## 2024-03-29 ENCOUNTER — Ambulatory Visit: Payer: Self-pay | Admitting: Internal Medicine

## 2024-03-29 LAB — LIPOPROTEIN A (LPA): Lipoprotein (a): 312.9 nmol/L — AB (ref <75.0)

## 2024-03-29 LAB — NMR, LIPOPROFILE
Cholesterol, Total: 154 mg/dL (ref 100–199)
HDL Particle Number: 31 umol/L (ref >=30.5)
HDL-C: 38 mg/dL — ABNORMAL LOW (ref >39)
LDL Particle Number: 982 nmol/L (ref <1000)
LDL Size: 20.2 nm — ABNORMAL LOW (ref >20.5)
LDL-C (NIH Calc): 79 mg/dL (ref 0–99)
LP-IR Score: 83 — ABNORMAL HIGH (ref <=45)
Small LDL Particle Number: 509 nmol/L (ref <=527)
Triglycerides: 224 mg/dL — ABNORMAL HIGH (ref 0–149)

## 2024-04-04 ENCOUNTER — Ambulatory Visit (INDEPENDENT_AMBULATORY_CARE_PROVIDER_SITE_OTHER)

## 2024-04-04 ENCOUNTER — Encounter: Payer: Self-pay | Admitting: Internal Medicine

## 2024-04-04 ENCOUNTER — Ambulatory Visit: Attending: Internal Medicine | Admitting: Internal Medicine

## 2024-04-04 VITALS — BP 126/78 | HR 72 | Ht 63.0 in | Wt 161.8 lb

## 2024-04-04 DIAGNOSIS — I447 Left bundle-branch block, unspecified: Secondary | ICD-10-CM | POA: Diagnosis not present

## 2024-04-04 DIAGNOSIS — Z7901 Long term (current) use of anticoagulants: Secondary | ICD-10-CM

## 2024-04-04 DIAGNOSIS — I255 Ischemic cardiomyopathy: Secondary | ICD-10-CM | POA: Diagnosis not present

## 2024-04-04 DIAGNOSIS — I152 Hypertension secondary to endocrine disorders: Secondary | ICD-10-CM

## 2024-04-04 DIAGNOSIS — Z951 Presence of aortocoronary bypass graft: Secondary | ICD-10-CM | POA: Diagnosis not present

## 2024-04-04 DIAGNOSIS — E7849 Other hyperlipidemia: Secondary | ICD-10-CM | POA: Diagnosis not present

## 2024-04-04 DIAGNOSIS — E7841 Elevated Lipoprotein(a): Secondary | ICD-10-CM

## 2024-04-04 DIAGNOSIS — E1159 Type 2 diabetes mellitus with other circulatory complications: Secondary | ICD-10-CM | POA: Diagnosis not present

## 2024-04-04 LAB — POCT INR: INR: 3.3 — AB (ref 2.0–3.0)

## 2024-04-04 NOTE — Progress Notes (Signed)
 Chief Complaint:  Follow-up  Primary Care Physician: Chandra Toribio POUR, MD  HPI:  Katrina Obrien is a 68 year old female who has a history of an unfortunate episode with left popliteal artery thrombus, PE, severe aortic stenosis which was ultimately diagnosed in a bicuspid aortic valve, as well as multivessel coronary disease, anterior MI, EF about 40% to 45% - all which came to a head about the same time. She underwent 3-vessel bypass as well as mechanical aortic valve replacement. She has done well on Coumadin  and was ultimately diagnosed with diabetes and is followed by Dr. Balan for this. Now she is markedly improved, although she has an ischemic cardiomyopathy with an EF of 40% to 45%. Her activity level is pretty good.  We recently performed repeat lipid testing, which demonstrated an elevated LDL particle number of 2163. The calculated LDL was 80. I asked her to increase her Crestor  to 40 mg at night and add 500 mg Niaspan. She reports significant flushing and intolerance to the Niaspan, despite taking aspirin 30 minutes prior.  Her laboratory work does look improved, with her particle number being reduced to 1547, LDL 55, HDL of 39.  She does report however worsening pain in her capsule when she walks that works its way up her legs. This is nothing as significant as when she was originally diagnosed with her acute arterial thrombus, but has concerns for claudication. Alternatively, this could represent myalgias from her statin.  Katrina Obrien returns today for followup. She is doing extremely well. She continues to be active and has no complaints of chest pain worsening shortness of breath. She does feel like her legs tire easily however this could be improved with more exercise. She denies any symptoms of claudication. Her INR has been therapeutic. She reports good blood sugar control and has an appointment with her endocrinologist in the next couple of weeks. We recently obtained an echocardiogram  which showed normalization of her EF to 55-60% in March of 2015. She's also had lower extremity arterial Dopplers which show preserved ABIs.  I the pleasure seeing Burna back in the office today. She reports doing fairly well except occasionally she gets somewhat swimmy headed. This is worse with change of position. As previously noted her EF has improved back to 55-60%. Recently she's had persistently elevated lipoprotein particles including an LDL particle number in the 1900s. This is despite being on Zetia  and Crestor  40 mg. Based on this I recommended that she start on Pralulent. She's been taking this now for just over a month and is having no problems with injection site reactions or side effects. We will plan to check her cholesterol again in about 2 months.  Katrina Obrien was seen in the office today in follow-up. Overall she seems to be doing very well. 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 She will be due for repeat cholesterol test after the first of the year. She denies any chest pain or worsening shortness of breath. She is complaining of some occasional swelling in her lower extremities which improves in the morning. She also gets some pain in her right leg. She is overdue for repeat lower extremity Dopplers and does have a history of course of arterial thrombus in the leg. Warfarin has been therapeutic and managed by the Coumadin  clinic. Cholesterol is still not at goal despite max dose therapy. For brief period of time she was on samples of Repatha  (PCSK9 inhibitor) with a marked response in cholesterol, however insurance  would not cover this.  12/10/2015  [name] returns today for follow-up. She says this is the best she has felt some time. She denies any chest pain or worsening shortness of breath. Recently her cholesterol numbers have risen with LDL now 102. This would put her not at goal therapy. Given her multiple comorbidities. Recent data indicates the fact that driving LDL  below 50 may improve cardiovascular outcomes. This was with the use of maximal statin therapy as well as the addition of a PCSK9 inhibitor, specifically Repatha . In fact, she had responded very well to Pralulent in the past, with LDL as low as 20 however that was not covered by insurance. 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 Aggressive therapy is warranted.  07/15/2016  Katrina Obrien returns today for follow-up. Overall she feels very well. She had one episode of intense burning in her chest which happened a few weeks ago. It was worse when she was laying down at night. She did not take any medicine for that she was concerned she could take it with her other medicines. It sounds like reflux she's not had any more symptoms like that. I told her she could use Tums or an H2 blocker for that as needed. She recently went on to Repatha  and her LDL cholesterol is now 19. This should be a significant benefit in slowing her overall cardiovascular risks. As mentioned previously she had an echo this past year which showed normal LVEF 55-60% and a normally functioning mechanical aortic valve. Her INR was therapeutic today.  07/09/2017  Katrina Obrien was seen today in follow-up.  She seems to be doing very well.  She reported that she had to shovel snow for several hours with her son and at the end of that did develop some chest tightness/soreness which went away very quickly.  She said she felt like she overworked herself, but generally has had no symptoms.  Recently we repeated a lipid profile which showed marked improvement in her LDL to 28 on statin, ezetimibe  and Repatha .  Her triglycerides were elevated at 349, however she mentioned today that this was nonfasting.  Also her blood sugar was elevated recently.  She was started on Jardiance , which I suspect has made a big difference in her blood sugars.  She says her hemoglobin  A1c now is down to 6.1.  Accordingly, her triglycerides have probably improved as well.  Fortunately, her husband was not with her today.  Apparently he is suffering from depression and has had several bouts.  He is not interested in leaving the house and is not as engaged in activities as he has been in the past.  This is obviously causing her a lot of stress and is not clear to her how she can help him.  03/19/2018  Taeya returns today for follow-up.  She says she is felt today the best she has in years.  Unfortunately, she had a recent colonoscopy which turned out okay however having to come off all of her medications was challenging for her and she was not a fan of bridging Lovenox .  She does report her blood sugars been well controlled.  Her cholesterol is at goal with recent LDL of 58 however it is actually been lower with Repatha .  Her triglycerides are also well controlled at 110.  Her Saint Jude mechanical valve is functioning properly by echo in 2017 which showed normal LVEF.  She denies  any chest pain symptoms with history of three-vessel CABG in 2012.  Blood pressure is at goal today as well.  EKG shows a stable left bundle branch block.  09/16/2018  Alejah is seen today in follow-up.  She is excited at the fact that she will be having a new grandbaby fairly soon.  Overall she is doing well without new symptoms.  She denies any chest pain or worsening shortness of breath.  Her INR was repeated today was 3.5.  No adjustments were made to her warfarin.  Her mechanical valve is working properly and was last assessed by echo last year.  She has lost some additional weight.  Her blood pressure was elevated little today however she has been under some stress and very recently took her medications.   09/28/2019  Yoselyn returns today for follow-up.  Overall she is doing well.  She said she has had both coronavirus vaccines.  She is back teaching in the classroom.  Unfortunately she has been  struggling with some weight gain.  This was due to stress and other issues with her father-in-law who ultimately died and also having to care for her husband.  Her triglycerides were elevated over the summer but LDL remained at goal.  Her A1c was 6.9 likely indicating that diabetes was a factor.  She is now working with a dietitian, exercising more regularly and eating better.  INR was 2.3 today which is slightly below her target of 2.5-3.5.  03/27/2020  Ms. Caison returns today for follow-up.  Overall she continues to feel well.  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  She denies any chest pain or worsening shortness of breath.  She denies any symptoms of claudication.  Labs have been fairly stable.  Recent LDL remains below 70.  Triglycerides were a little elevated.  She is on Praluent  and 40 mg of rosuvastatin .  Hemoglobin A1c was 8.1.  She is on Jardiance  and Metformin .  She may need additional treatments if she is not able to reduce that further with diet.  The last echo showed stable valve gradients in December 2019.  10/04/2020  Ms. Knowlton is seen today in follow-up.  She underwent a recent echocardiogram which surprisingly showed some decline in LVEF to 45 to 50%.  Is been a small increase in her aortic valve gradient to 16 mmHg.  The aorta is mildly dilated at 39 mm.  She reports that she has had a little bit more fatigue.  She gets short of breath but only with marked exertion.  She does have a left bundle branch block which has been chronic, however I wonder whether this might be contributing to her cardiomyopathy.  She also had COVID-19 despite being vaccinated and boosted just a couple months ago.  She feels like she is recovered from that however I wonder if that could have played a role in this cardiomyopathy as well.  She does have coronary artery disease with prior grafts and she could have some degree of ischemia.  She says she does get  some dyspnea and fatigue but no real anginal symptoms   10/23/2020  Ms. Wassel returns today for follow-up of her Myoview  stress test.  I had decreased her Crestor  because her LDL was actually quite low.  Surprisingly she reports some improvement in her fatigue.  Perhaps she was having some side effects from the statin.  Showed a possible small area of  anteroseptal ischemia but might be related to her left bundle branch block.  It was calculated as an EF of 28% but actually was more like 45 to 50%.  This is more consistent with her echo findings.  I do think there is some mild cardiomyopathy.  She denies any angina but does have still some fatigue and shortness of breath.  After some discussion today I felt less inclined to pursue cardiac catheterization given the small defect and the fact that she had some improvement in her symptoms off of the Crestor .  I advised actually that we take a 2-week statin holiday off the Crestor  but she should continue her PCSK9 inhibitor.  If her symptoms are markedly improved then we will need to consider another option to reach her cholesterol targets.  Also I would advise we switch her ramipril  to Entresto  but start that after her statin holiday as not to confuse any potential side effects.  01/30/2021  Ms. Sanborn is seen today in follow-up.  Unfortunately she has been under a lot of stress.  Her brother just died.  He also probably had FH with bypass surgery not too long after she had it and stents as well.  Apparently had sudden cardiac death and was found about 2 days later after he did not show up to work.  She has been struggling with this.  In addition she had stopped her Crestor  because of potential side effects.  She did say that she felt a little better off the Crestor .  That being said her cholesterol has rebounded quite high.  Her total is now 268, HDL 47, LDL 158 and triglycerides 661.  She remains on Praluent .  She seems to be tolerating Entresto  which we had  switched her to from an ACE inhibitor.  This is for LVEF 45 to 50%, suspect due to her left bundle branch block with a QRS D of 154 ms.  05/27/2021  Ms. Mcgraw returns today for follow-up.  She underwent a repeat echocardiogram which shows stable LVEF of 45 to 50%.  After switching her statins, she seems to be tolerating the atorvastatin  better.  Lipids are now much improved with total cholesterol 103, HDL 44, triglycerides 120 and LDL 37.  She is also on Praluent .  EKG shows sinus rhythm with PVCs and a left bundle branch block with QRSD of 148 ms.  She denies any worsening shortness of breath.  Level of fatigue is unchanged.  11/27/2021  Ms. Speas is seen today in follow-up.  Overall she is doing fairly well.  She denies any worsening shortness of breath or chest pain.  Her last echo as mentioned above showed EF 45 to 50% which has been stable on heart failure therapy regimen.  Her lipids have improved and continue to be well treated.  Total cholesterol now 117, triglycerides 195 HDL 45 and LDL 41.  She has gone to [employer] and gets the state teachers plan.  Their preferred PCSK9 inhibitor now is Repatha  which she previously was taking and we will work to try to transition her back off of Praluent  to this.  05/30/2022  Ms. Siedlecki is seen today in follow-up.  She continues to do well.  Recently she rescued a dog and has been walking more regularly with the dog.  She said her husband had been improving as well with regards to his depression but unfortunately suffered a stroke.  MRI apparently showed that he had had several strokes.  He has now had some deficit  in his left side which is recovering slowly with rehab.  She continues to work.  She denies any chest pain or shortness of breath or any heart failure symptoms.  INR has been stable.  Her lipids have been well controlled after transitioning back to Repatha  from Praluent  due to insurance changes.  Total cholesterol now 119, triglycerides 126, HDL 47 and  LDL 50.  02/18/2023  Ms. Donegan returns today for follow-up.  She is doing fairly well.  She says she has been physically active.  She is mowing her lawn and has no limitations with that with a self-propelled mower.  She had a recent echo which does show improvement in LVEF now up to 50 to 55%.  Her mechanical aortic valve gradients are normal.  Her INR has been stable.  She says she has been eating out a little bit more.  Cholesterol a month ago showed total 137, triglycerides 168, HDL 40 and LDL 68 which is an increased somewhat over her earlier cholesterol numbers and I suspect may be mostly dietary.  Blood pressure is well-controlled today.  04/04/2024  Ms. Schriever returns today for follow-up.  Overall she has had a difficult past 6 months.  She reported she had a fall with significant facial ecchymosis which took some time to recover.  She had a number of head CT scans but fortunately no intracranial bleeding.  She is also had issues with disequilibrium and imbalance which she says is improving somewhat.  She has had some headaches which are migraine type.  I did repeat an echocardiogram this spring which showed stable LVEF 51% with normal functioning mechanical aortic valve.  I what is most interesting is that she was tested for LP(a) which was high at 312.9 nmol/L.  This is on Repatha  which she has been on for a number of years.  Recent lipid showed an LDL particle number of 982, LDL 79, HDL 38 and triglycerides 224.  She reports her blood sugars been elevated as well with A1c that went up to 7.7% in June and that she had been previously treated with steroids and other reasons for her elevation in her triglycerides.  I do think that the LP(a) elevation ties together all of her history including both arterial and venous thrombosis in the past, early onset aortic valve disease, as well as multivessel coronary artery disease.  She understands that other family members should be tested for LP(a) as  well.  PMHx:  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   Clotting disorder (HCC)    anticoagulant - coumadin  hx DVT, PE   Coronary artery disease    s/p 3- vessel bypass    Diabetes mellitus without complication (HCC)    DVT (deep venous thrombosis) (HCC)    Heart murmur    Hyperlipidemia    Hypertension    Ischemic cardiomyopathy    40 to 45%   Left bundle branch block    Mechanical heart valve present    Jan 2012   Popliteal artery occlusion, left (HCC)    recent  status  post angioplasty   Pulmonary embolism (HCC)    associated with DVT   Pulmonary embolus (HCC)    Thyroid  disease    hyperthyroidism    Past Surgical History:  Procedure Laterality Date   AORTIC VALVE REPLACEMENT (AVR)/CORONARY ARTERY BYPASS GRAFTING (CABG)  08/16/2010  VAN  TRIGT   AV replacement w/19-mm mechanical St Jude valve ([id number]),CABGX3  lima to LAD, SVG to distal circ,SVG to posterior descending   ASD REPAIR     CARDIAC CATHETERIZATION  08/08/2010   ,LAD 50% PROX,605 TANDEM SEGMENTAL STENOSIS,LEFT CIRC 60%-AV GROOVE, RGT COR 40% PROX ON THE BEND ,80% PROX PDA,   CARDIAC VALVE REPLACEMENT  08/16/2010   CORONARY ARTERY BYPASS GRAFT  08/16/2010   LIMA TO lad,svg to distal circ ,svg to posterior descending   DOPPLER ECHOCARDIOGRAPHY  08/09/2010   EF35% to 40% ,bbicuspid ;severely  thickened ,severely calcified leaflets, LV normal   LEA doppler  08/07/2010   left popliteal occlusion ,and anterior tibial complete occlusion   NM MYOCAR PERF WALL MOTION  08/06/2010   EF 31%  LOW RISK SCAN   nuc     PV ANGIOGRAM  08/08/2010   successful PTA LEFT POPLITEAL   TUBAL LIGATION  1987    FAMHx:  Family History  Problem Relation Age of Onset   Cancer Mother    Early death Mother    Cancer Father    CAD Father    Heart attack Father 48       first MI, 2 major after that,  multiple silent MIs   Early death Father    Heart disease Father    Stroke Brother    CAD Brother 27       first stent   Early death Brother    Heart attack Brother    CAD Brother 48       CABG    Hypertension Brother    Heart attack Brother 45       died from MI   Diabetes Brother    Early death Brother    Heart disease Brother    Heart disease Brother    Hypertension Brother    Heart disease Brother    Hypertension Brother    Colon cancer Neg Hx    Esophageal cancer Neg Hx    Rectal cancer Neg Hx    Stomach cancer Neg Hx     SOCHx:   reports that she has never smoked. She has never been exposed to tobacco smoke. She has never used smokeless tobacco. She reports current alcohol use of about 7.0 standard drinks of alcohol per week. She reports that she does not use drugs.  ALLERGIES:  Allergies  Allergen Reactions   Niaspan [Niacin Er (Antihyperlipidemic)] Other (See Comments)    Flushing, even with ASA   Rosuvastatin  Other (See Comments)    Fatigue, weakness    ROS: Pertinent items noted in HPI and remainder of comprehensive ROS otherwise negative.  HOME MEDS: Current Outpatient Medications  Medication Sig Dispense Refill   aspirin EC 81 MG tablet Take 81 mg by mouth daily.     Atogepant  (QULIPTA ) 30 MG TABS Take 1 tablet (30 mg total) by mouth daily. 30 tablet 2   atorvastatin  (LIPITOR) 40 MG tablet TAKE 1 TABLET (40 MG TOTAL) BY MOUTH DAILY. 90 tablet 3   diazepam  (VALIUM ) 2 MG tablet Take 1 tablet (2 mg total) by mouth every 6 (six) hours as needed (dizziness). 16 tablet 0   Evolocumab  (REPATHA  SURECLICK) 140 MG/ML SOAJ INJECT 140 MG INTO THE SKIN EVERY 14 DAYS. 6 mL 3   furosemide  (LASIX ) 40 MG tablet Take 0.5 tablets (20 mg total) by mouth daily. 45 tablet 2   JARDIANCE  25 MG TABS tablet TAKE 1 TABLET (25 MG TOTAL) BY MOUTH DAILY. 90 tablet  0   levothyroxine  (SYNTHROID ) 50 MCG tablet TAKE 1 TABLET BY MOUTH DAILY BEFORE BREAKFAST 90 tablet 1   meclizine   (ANTIVERT ) 25 MG tablet Take 1 tablet (25 mg total) by mouth 3 (three) times daily as needed for dizziness. 30 tablet 0   metoprolol  succinate (TOPROL -XL) 100 MG 24 hr tablet TAKE 1 TABLET BY MOUTH DAILY. TAKE WITH OR IMMEDIATELY FOLLOWING A MEAL. 90 tablet 0   ondansetron  (ZOFRAN -ODT) 4 MG disintegrating tablet Take 1 tablet (4 mg total) by mouth every 8 (eight) hours as needed for nausea or vomiting. 20 tablet 0   pregabalin  (LYRICA ) 75 MG capsule TAKE 1 CAPSULE (75 MG TOTAL) BY MOUTH DAILY. TAKE 1 TO 3 HOURS BEFORE BEDTIME. CAN INCREASE TO 2 CAPSULES DAILY. 90 capsule 2   promethazine  (PHENERGAN ) 25 MG tablet TAKE 1 TABLET BY MOUTH EVERY 6 HOURS AS NEEDED FOR NAUSEA OR VOMITING. 30 tablet 2   sacubitril -valsartan  (ENTRESTO ) 24-26 MG TAKE 1 TABLET BY MOUTH TWICE A DAY 180 tablet 3   Semaglutide , 2 MG/DOSE, (OZEMPIC , 2 MG/DOSE,) 8 MG/3ML SOPN Inject 2 mg into the skin once a week. 9 mL 1   VITAMIN D  PO Take 1,000 Units by mouth daily.     warfarin (COUMADIN ) 2 MG tablet TAKE 1 TO 1 AND 1/2 TABLETS BY MOUTH DAILY AS DIRECTED BY COUMADIN  CLINIC. 100 tablet 1   Atogepant  (QULIPTA ) 30 MG TABS Take by mouth.     No current facility-administered medications for this visit.    LABS/IMAGING: Results for orders placed or performed in visit on 04/04/24 (from the past 48 hours)  POCT INR     Status: Abnormal   Collection Time: 04/04/24 10:45 AM  Result Value Ref Range   INR 3.3 (A) 2.0 - 3.0   POC INR       No results found.  VITALS: BP 126/78   Pulse 72   Ht 5' 3 (1.6 m)   Wt 161 lb 12.8 oz (73.4 kg)   LMP  (LMP Unknown)   SpO2 98%   BMI 28.66 kg/m   EXAM: General appearance: alert and no distress Neck: no carotid bruit, no JVD, and thyroid  not enlarged, symmetric, no tenderness/mass/nodules Lungs: clear to auscultation bilaterally Heart: S1, S2 normal and ejection click present Abdomen: soft, non-tender; bowel sounds normal; no masses,  no organomegaly Extremities: extremities  normal, atraumatic, no cyanosis or edema Pulses: 2+ and symmetric Skin: Skin color, texture, turgor normal. No rashes or lesions Neurologic: Grossly normal Psych: Pleasant  EKG: EKG Interpretation Date/Time:  Monday April 04 2024 11:44:58 EDT Ventricular Rate:  72 PR Interval:  136 QRS Duration:  148 QT Interval:  422 QTC Calculation: 462 R Axis:   60  Text Interpretation: Normal sinus rhythm with sinus arrhythmia Left bundle branch block When compared with ECG of 24-Nov-2023 11:15, T wave inversion no longer evident in Inferior leads Confirmed by Mona Kent [phone]) on 04/04/2024 12:06:11 PM    ASSESSMENT: Ischemic cardiomyopathy-LVEF 45 to 50% (09/2020) -> 50-55% (11/2022) Coronary artery disease status post 3 vessel CABG (LIMA to LAD, SVG to circumflex, SVG to PDA) - 07/2010 Severe aortic stenosis and a bicuspid aortic valve, status post 19 mm St. 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   Mrs. Cisek  was recently found to have an elevated LP(a) as well which is not surprising given her familial hyperlipidemia.  It also may likely explain her prior arterial venous thrombosis as well as her early onset valve disease and multivessel coronary artery disease.  Fortunately her LVEF has remained fairly stable now at 51% with normally functioning mechanical aortic valve.  INR was a little labile recently with her fall, steroids and other issues but has improved.  Blood pressure is at goal today.  Her lipids are little higher than target.  I think this will improve likely over the next several months that she started to become more active and she did have a number of recent health issues, steroid use and other reasons for  high cholesterol.  No changes to her medicines today.  Will plan follow-up in about 6 months with repeat lipids.   Vinie KYM Maxcy, MD, [facility], FNLA, FACP  [place]  [facility]  Medical Director of the [facility]  [facility] Diplomate of the American Board of Clinical Lipidology Attending Cardiologist  Direct Dial: [phone]  Fax: [phone]  Website:  [email or web address]   Vinie BROCKS Shail Urbas 04/04/2024, 1:21 PM

## 2024-04-04 NOTE — Progress Notes (Signed)
 INR 3.3 Please see anticoagulation encounter Continue taking warfarin 1 tablet daily except 1.5 tablets each Monday.  Repeat INR in 6 weeks. Coumadin  Clinic [phone]

## 2024-04-04 NOTE — Patient Instructions (Signed)
 Continue taking warfarin 1 tablet daily except 1.5 tablets each Monday.  Repeat INR in 6 weeks. Coumadin  Clinic [phone]

## 2024-04-04 NOTE — Patient Instructions (Signed)
 Medication Instructions:  NO CHANGES  *If you need a refill on your cardiac medications before your next appointment, please call your pharmacy*  Lab Work: FASTING lab work in 6 months  If you have labs (blood work) drawn today and your tests are completely normal, you will receive your results only by: [employer] (if you have MyChart) OR A paper copy in the mail If you have any lab test that is abnormal or we need to change your treatment, we will call you to review the results.   Follow-Up: At [facility], you and your health needs are our priority.  As part of our continuing mission to provide you with exceptional heart care, our providers are all part of one team.  This team includes your primary Cardiologist (physician) and Advanced Practice Providers or APPs (Physician Assistants and Nurse Practitioners) who all work together to provide you with the care you need, when you need it.  Your next appointment:   6 months with Dr. Mona  We recommend signing up for the patient portal called MyChart.  Sign up information is provided on this After Visit Summary.  MyChart is used to connect with patients for Virtual Visits (Telemedicine).  Patients are able to view lab/test results, encounter notes, upcoming appointments, etc.  Non-urgent messages can be sent to your provider as well.   To learn more about what you can do with MyChart, go to [email or web address].

## 2024-04-10 ENCOUNTER — Other Ambulatory Visit: Payer: Self-pay | Admitting: Internal Medicine

## 2024-04-19 ENCOUNTER — Other Ambulatory Visit: Payer: Self-pay | Admitting: Family Medicine

## 2024-04-19 DIAGNOSIS — Z1231 Encounter for screening mammogram for malignant neoplasm of breast: Secondary | ICD-10-CM

## 2024-04-25 ENCOUNTER — Encounter: Payer: Self-pay | Admitting: Family Medicine

## 2024-04-25 ENCOUNTER — Ambulatory Visit: Admitting: Family Medicine

## 2024-04-25 VITALS — BP 123/73 | HR 78 | Ht 63.0 in

## 2024-04-25 DIAGNOSIS — Z0000 Encounter for general adult medical examination without abnormal findings: Secondary | ICD-10-CM | POA: Diagnosis not present

## 2024-04-25 DIAGNOSIS — I152 Hypertension secondary to endocrine disorders: Secondary | ICD-10-CM

## 2024-04-25 DIAGNOSIS — E1159 Type 2 diabetes mellitus with other circulatory complications: Secondary | ICD-10-CM | POA: Diagnosis not present

## 2024-04-25 DIAGNOSIS — Z7984 Long term (current) use of oral hypoglycemic drugs: Secondary | ICD-10-CM

## 2024-04-25 DIAGNOSIS — E1169 Type 2 diabetes mellitus with other specified complication: Secondary | ICD-10-CM | POA: Diagnosis not present

## 2024-04-25 DIAGNOSIS — Z7985 Long-term (current) use of injectable non-insulin antidiabetic drugs: Secondary | ICD-10-CM

## 2024-04-25 DIAGNOSIS — Z23 Encounter for immunization: Secondary | ICD-10-CM | POA: Diagnosis not present

## 2024-04-25 DIAGNOSIS — R42 Dizziness and giddiness: Secondary | ICD-10-CM

## 2024-04-25 DIAGNOSIS — I83812 Varicose veins of left lower extremities with pain: Secondary | ICD-10-CM | POA: Insufficient documentation

## 2024-04-25 DIAGNOSIS — E1165 Type 2 diabetes mellitus with hyperglycemia: Secondary | ICD-10-CM | POA: Diagnosis not present

## 2024-04-25 DIAGNOSIS — E785 Hyperlipidemia, unspecified: Secondary | ICD-10-CM

## 2024-04-25 LAB — POCT GLYCOSYLATED HEMOGLOBIN (HGB A1C): HbA1c POC (<> result, manual entry): 6.9 % (ref 4.0–5.6)

## 2024-04-25 NOTE — Assessment & Plan Note (Signed)
 On Repatha  with recent LPA testing showing extremely high levels, LDL under goal per Dr. Mona. Triglycerides elevated, plan to recheck in 6 months before adding therapy. - Continue Repatha  - Consider icosapent ethyl for triglycerides if insurance coverage available - Follow up with Dr. Mona in 6 months

## 2024-04-25 NOTE — Patient Instructions (Signed)
 It was nice to see you today,  We addressed the following topics today: -I am sending in a referral to vein vascular so they can evaluate your varicose veins.  Someone will call you, if you not heard from somebody in a month let us  know - Your A1c was 6.9.  No changes to your medications at this time.   Have a great day,  Rolan Slain, MD

## 2024-04-25 NOTE — Progress Notes (Unsigned)
 Established Patient Office Visit  Subjective   Patient ID: Katrina Obrien, female    DOB: [birth year]  Age: 68 y.o. MRN: [id number]  Chief Complaint  Patient presents with   Medical Management of Chronic Issues    HPI  Subjective - Continuing Repatha  injection for cholesterol management, on medication for extended period - Recent hereditary testing with Dr. Mona showed significantly high LPA levels - Vertigo significantly improved but not completely resolved, returns with fast movements or busy activity - Completed physical therapy for vertigo with good improvement, previously required cane and wall support - Continues to have difficulty walking in the dark, avoids this activity - New complaint of painful varicose vein over knee for past week, applied ice for relief - Reports knowing limitations with vertigo, able to manage activities by stopping and refocusing when needed  Medications: Repatha  injection (cholesterol), previously trialed other cholesterol medications over 13 years  PMH: Heart problems 13 years ago with DVT requiring intervention, diabetes, high cholesterol, vertigo. PSH: Cardiac procedure 13 years ago with clot removal via vascular access. FH: Hereditary cholesterol issues. Social Hx: Recently retired but substitute teaching occasionally  ROS: Vertigo improved but persists with fast movements, denies walking in dark due to balance issues, reports new knee varicose vein pain   The 10-year ASCVD risk score (Arnett DK, et al., 2019) is: 15.4%  Health Maintenance Due  Topic Date Due   Pneumococcal Vaccine: 50+ Years (2 of 2 - PPSV23, PCV20, or PCV21) 07/06/2018   Influenza Vaccine  02/26/2024   OPHTHALMOLOGY EXAM  03/08/2024   COVID-19 Vaccine (4 - 2025-26 season) 03/28/2024   Diabetic kidney evaluation - Urine ACR  05/18/2024      Objective:     BP 123/73   Pulse 78   Ht 5' 3 (1.6 m)   LMP  (LMP Unknown)   SpO2 99%   BMI 28.66 kg/m    Physical  Exam Gen: alert, oriented Pulm: no respiratory distress Psych: pleasant affect Ext: normal foot exam b/l   No results found for any visits on 04/25/24.      Assessment & Plan:   Uncontrolled type 2 diabetes mellitus with hyperglycemia (HCC) Assessment & Plan: A1C improved from 7.7 to 6.9, at goal. Monitoring blood sugar frequently with continued improvement noted. - Annual foot exam completed with normal sensation - Annual urine microalbumin test ordered - Ophthalmology follow-up scheduled October - Continue current management  Orders: -     POCT glycosylated hemoglobin (Hb A1C) -     Microalbumin / creatinine urine ratio -     Comprehensive metabolic panel with GFR; Future -     CBC with Differential/Platelet; Future -     Lipid panel; Future -     TSH; Future -     Hemoglobin A1c; Future  Hypertension associated with diabetes (HCC) -     POCT glycosylated hemoglobin (Hb A1C) -     Microalbumin / creatinine urine ratio -     Comprehensive metabolic panel with GFR; Future -     CBC with Differential/Platelet; Future -     TSH; Future  Type 2 diabetes mellitus with other specified complication, without long-term current use of insulin (HCC) -     POCT glycosylated hemoglobin (Hb A1C) -     Microalbumin / creatinine urine ratio  Varicose veins of left lower extremity with pain Assessment & Plan: New painful vein over knee, one week duration. - Vascular surgery referral for evaluation and possible sclerotherapy -  Consider compression stockings for symptom relief  Orders: -     Ambulatory referral to Vascular Surgery  Hyperlipidemia associated with type 2 diabetes mellitus (HCC) Assessment & Plan: On Repatha  with recent LPA testing showing extremely high levels, LDL under goal per Dr. Mona. Triglycerides elevated, plan to recheck in 6 months before adding therapy. - Continue Repatha  - Consider icosapent ethyl for triglycerides if insurance coverage available -  Follow up with Dr. Mona in 6 months   Vertigo Assessment & Plan: Significantly improved with physical therapy, functional limitations manageable. - Continue current activity modifications - Avoid fast movements and walking in dark   Healthcare maintenance Assessment & Plan: - Flu vaccine administered - Pneumococcal vaccine recommended at pharmacy - Mammogram scheduled - Physical exam scheduled January      Return in about 4 months (around 08/17/2024) for physical.    [name], MD

## 2024-04-25 NOTE — Assessment & Plan Note (Signed)
 A1C improved from 7.7 to 6.9, at goal. Monitoring blood sugar frequently with continued improvement noted. - Annual foot exam completed with normal sensation - Annual urine microalbumin test ordered - Ophthalmology follow-up scheduled October - Continue current management

## 2024-04-25 NOTE — Assessment & Plan Note (Signed)
 New painful vein over knee, one week duration. - Vascular surgery referral for evaluation and possible sclerotherapy - Consider compression stockings for symptom relief

## 2024-04-25 NOTE — Assessment & Plan Note (Signed)
-   Flu vaccine administered - Pneumococcal vaccine recommended at pharmacy - Mammogram scheduled - Physical exam scheduled January

## 2024-04-25 NOTE — Assessment & Plan Note (Signed)
 Significantly improved with physical therapy, functional limitations manageable. - Continue current activity modifications - Avoid fast movements and walking in dark

## 2024-04-26 LAB — MICROALBUMIN / CREATININE URINE RATIO
Creatinine, Urine: 67.7 mg/dL
Microalb/Creat Ratio: 36 mg/g{creat} — ABNORMAL HIGH (ref 0–29)
Microalbumin, Urine: 24.4 ug/mL

## 2024-04-27 ENCOUNTER — Other Ambulatory Visit: Payer: Self-pay | Admitting: Family Medicine

## 2024-04-27 DIAGNOSIS — E1165 Type 2 diabetes mellitus with hyperglycemia: Secondary | ICD-10-CM

## 2024-04-28 ENCOUNTER — Ambulatory Visit
Admission: RE | Admit: 2024-04-28 | Discharge: 2024-04-28 | Disposition: A | Source: Ambulatory Visit | Attending: Family Medicine

## 2024-04-28 DIAGNOSIS — Z1231 Encounter for screening mammogram for malignant neoplasm of breast: Secondary | ICD-10-CM

## 2024-05-09 ENCOUNTER — Other Ambulatory Visit: Payer: Self-pay | Admitting: Family Medicine

## 2024-05-16 ENCOUNTER — Ambulatory Visit: Attending: Internal Medicine

## 2024-05-16 DIAGNOSIS — Z7901 Long term (current) use of anticoagulants: Secondary | ICD-10-CM | POA: Diagnosis not present

## 2024-05-16 LAB — POCT INR: INR: 3.3 — AB (ref 2.0–3.0)

## 2024-05-16 NOTE — Progress Notes (Signed)
 INR 3.3 Please see anticoagulation encounter Continue taking warfarin 1 tablet daily except 1.5 tablets each Monday.  Repeat INR in 6 weeks. Coumadin  Clinic [phone]

## 2024-05-16 NOTE — Patient Instructions (Signed)
 Continue taking warfarin 1 tablet daily except 1.5 tablets each Monday.  Repeat INR in 6 weeks. Coumadin  Clinic [phone]

## 2024-05-19 DIAGNOSIS — E119 Type 2 diabetes mellitus without complications: Secondary | ICD-10-CM | POA: Diagnosis not present

## 2024-05-22 ENCOUNTER — Other Ambulatory Visit: Payer: Self-pay | Admitting: Family Medicine

## 2024-05-22 DIAGNOSIS — E039 Hypothyroidism, unspecified: Secondary | ICD-10-CM

## 2024-05-30 ENCOUNTER — Encounter: Payer: Self-pay | Admitting: Radiology

## 2024-06-06 ENCOUNTER — Other Ambulatory Visit: Payer: Self-pay | Admitting: Family Medicine

## 2024-06-06 DIAGNOSIS — G2581 Restless legs syndrome: Secondary | ICD-10-CM

## 2024-06-20 ENCOUNTER — Other Ambulatory Visit: Payer: Self-pay | Admitting: Family Medicine

## 2024-06-20 ENCOUNTER — Other Ambulatory Visit: Payer: Self-pay | Admitting: Internal Medicine

## 2024-06-20 DIAGNOSIS — E1159 Type 2 diabetes mellitus with other circulatory complications: Secondary | ICD-10-CM

## 2024-06-26 ENCOUNTER — Other Ambulatory Visit: Payer: Self-pay

## 2024-06-26 DIAGNOSIS — E1165 Type 2 diabetes mellitus with hyperglycemia: Secondary | ICD-10-CM

## 2024-06-27 ENCOUNTER — Ambulatory Visit: Attending: Internal Medicine

## 2024-06-27 DIAGNOSIS — Z7901 Long term (current) use of anticoagulants: Secondary | ICD-10-CM | POA: Diagnosis not present

## 2024-06-27 LAB — POCT INR: INR: 2.5 (ref 2.0–3.0)

## 2024-06-27 NOTE — Patient Instructions (Signed)
 Continue taking warfarin 1 tablet daily except 1.5 tablets each Monday.  Repeat INR in 6 weeks. Coumadin  Clinic [phone]

## 2024-06-27 NOTE — Progress Notes (Signed)
 INR 2.5 Please see anticoagulation encounter Continue taking warfarin 1 tablet daily except 1.5 tablets each Monday.  Repeat INR in 6 weeks. Coumadin  Clinic [phone]

## 2024-07-06 ENCOUNTER — Other Ambulatory Visit: Payer: Self-pay | Admitting: Internal Medicine

## 2024-07-06 ENCOUNTER — Other Ambulatory Visit: Payer: Self-pay | Admitting: Family Medicine

## 2024-07-19 ENCOUNTER — Other Ambulatory Visit: Payer: Self-pay | Admitting: Internal Medicine

## 2024-07-19 ENCOUNTER — Other Ambulatory Visit: Payer: Self-pay | Admitting: Family Medicine

## 2024-07-19 DIAGNOSIS — Z952 Presence of prosthetic heart valve: Secondary | ICD-10-CM

## 2024-07-19 DIAGNOSIS — I2699 Other pulmonary embolism without acute cor pulmonale: Secondary | ICD-10-CM

## 2024-07-19 DIAGNOSIS — Z7901 Long term (current) use of anticoagulants: Secondary | ICD-10-CM

## 2024-07-19 DIAGNOSIS — I82409 Acute embolism and thrombosis of unspecified deep veins of unspecified lower extremity: Secondary | ICD-10-CM

## 2024-08-08 ENCOUNTER — Ambulatory Visit: Attending: Internal Medicine

## 2024-08-08 DIAGNOSIS — Z7901 Long term (current) use of anticoagulants: Secondary | ICD-10-CM

## 2024-08-08 LAB — POCT INR: INR: 2.5 (ref 2.0–3.0)

## 2024-08-08 NOTE — Progress Notes (Signed)
 INR 2.5 Please see anticoagulation encounter Continue taking warfarin 1 tablet daily except 1.5 tablets each Monday.  Repeat INR in 6 weeks. Coumadin  Clinic [phone]

## 2024-08-08 NOTE — Patient Instructions (Signed)
 Continue taking warfarin 1 tablet daily except 1.5 tablets each Monday.  Repeat INR in 6 weeks. Coumadin  Clinic [phone]

## 2024-08-16 ENCOUNTER — Telehealth: Payer: Self-pay

## 2024-08-16 ENCOUNTER — Other Ambulatory Visit: Payer: Self-pay

## 2024-08-16 DIAGNOSIS — E1159 Type 2 diabetes mellitus with other circulatory complications: Secondary | ICD-10-CM

## 2024-08-16 MED ORDER — QULIPTA 30 MG PO TABS: 1.0000 | ORAL_TABLET | Freq: Every day | ORAL | 2 refills | Status: AC

## 2024-08-16 NOTE — Telephone Encounter (Signed)
 Copied from CRM [id number]. Topic: Clinical - Medication Refill >> Aug 16, 2024  1:36 PM Kevelyn M wrote: Medication: QULIPTA  30 MG TABS  Has the patient contacted their pharmacy? Yes (Agent: If no, request that the patient contact the pharmacy for the refill. If patient does not wish to contact the pharmacy document the reason why and proceed with request.) (Agent: If yes, when and what did the pharmacy advise?)  This is the patient's preferred pharmacy:  [facility] [phone] [address], [place] - [address] [place] [address] Phone: [phone] Fax: [phone]  Is this the correct pharmacy for this prescription? Yes If no, delete pharmacy and type the correct one.   Has the prescription been filled recently? No  Is the patient out of the medication? Yes  Has the patient been seen for an appointment in the last year OR does the patient have an upcoming appointment? Yes  Can we respond through MyChart? Yes  Agent: Please be advised that Rx refills may take up to 3 business days. We ask that you follow-up with your pharmacy.   Prescription has been sent in to pharmacy on file.

## 2024-08-17 ENCOUNTER — Other Ambulatory Visit: Payer: Self-pay | Admitting: Vascular Surgery

## 2024-08-17 DIAGNOSIS — M7989 Other specified soft tissue disorders: Secondary | ICD-10-CM

## 2024-08-18 ENCOUNTER — Other Ambulatory Visit

## 2024-08-18 DIAGNOSIS — I152 Hypertension secondary to endocrine disorders: Secondary | ICD-10-CM

## 2024-08-18 DIAGNOSIS — E1165 Type 2 diabetes mellitus with hyperglycemia: Secondary | ICD-10-CM

## 2024-08-19 ENCOUNTER — Ambulatory Visit: Payer: Self-pay | Admitting: Family Medicine

## 2024-08-19 LAB — LIPID PANEL
Chol/HDL Ratio: 2.9 ratio (ref 0.0–4.4)
Cholesterol, Total: 124 mg/dL (ref 100–199)
HDL: 43 mg/dL
LDL Chol Calc (NIH): 52 mg/dL (ref 0–99)
Triglycerides: 178 mg/dL — ABNORMAL HIGH (ref 0–149)
VLDL Cholesterol Cal: 29 mg/dL (ref 5–40)

## 2024-08-19 LAB — CBC WITH DIFFERENTIAL/PLATELET
Basophils Absolute: 0 x10E3/uL (ref 0.0–0.2)
Basos: 1 %
EOS (ABSOLUTE): 0.2 x10E3/uL (ref 0.0–0.4)
Eos: 2 %
Hematocrit: 39.8 % (ref 34.0–46.6)
Hemoglobin: 12.8 g/dL (ref 11.1–15.9)
Immature Grans (Abs): 0 x10E3/uL (ref 0.0–0.1)
Immature Granulocytes: 0 %
Lymphocytes Absolute: 2.4 x10E3/uL (ref 0.7–3.1)
Lymphs: 37 %
MCH: 30.9 pg (ref 26.6–33.0)
MCHC: 32.2 g/dL (ref 31.5–35.7)
MCV: 96 fL (ref 79–97)
Monocytes Absolute: 0.5 x10E3/uL (ref 0.1–0.9)
Monocytes: 7 %
Neutrophils Absolute: 3.5 x10E3/uL (ref 1.4–7.0)
Neutrophils: 53 %
Platelets: 276 x10E3/uL (ref 150–450)
RBC: 4.14 x10E6/uL (ref 3.77–5.28)
RDW: 12.3 % (ref 11.7–15.4)
WBC: 6.6 x10E3/uL (ref 3.4–10.8)

## 2024-08-19 LAB — HEMOGLOBIN A1C
Est. average glucose Bld gHb Est-mCnc: 157 mg/dL
Hgb A1c MFr Bld: 7.1 % — ABNORMAL HIGH (ref 4.8–5.6)

## 2024-08-19 LAB — COMPREHENSIVE METABOLIC PANEL WITH GFR
ALT: 21 IU/L (ref 0–32)
AST: 13 IU/L (ref 0–40)
Albumin: 4.4 g/dL (ref 3.9–4.9)
Alkaline Phosphatase: 52 IU/L (ref 49–135)
BUN/Creatinine Ratio: 30 — ABNORMAL HIGH (ref 12–28)
BUN: 25 mg/dL (ref 8–27)
Bilirubin Total: 0.5 mg/dL (ref 0.0–1.2)
CO2: 21 mmol/L (ref 20–29)
Calcium: 9.2 mg/dL (ref 8.7–10.3)
Chloride: 105 mmol/L (ref 96–106)
Creatinine, Ser: 0.83 mg/dL (ref 0.57–1.00)
Globulin, Total: 2.2 g/dL (ref 1.5–4.5)
Glucose: 154 mg/dL — ABNORMAL HIGH (ref 70–99)
Potassium: 4.4 mmol/L (ref 3.5–5.2)
Sodium: 141 mmol/L (ref 134–144)
Total Protein: 6.6 g/dL (ref 6.0–8.5)
eGFR: 77 mL/min/1.73

## 2024-08-19 LAB — TSH: TSH: 1.23 u[IU]/mL (ref 0.450–4.500)

## 2024-08-25 ENCOUNTER — Encounter: Payer: Self-pay | Admitting: Family Medicine

## 2024-08-25 ENCOUNTER — Ambulatory Visit: Admitting: Family Medicine

## 2024-08-25 VITALS — BP 126/70 | HR 79 | Ht 63.0 in | Wt 159.0 lb

## 2024-08-25 DIAGNOSIS — I152 Hypertension secondary to endocrine disorders: Secondary | ICD-10-CM | POA: Diagnosis not present

## 2024-08-25 DIAGNOSIS — E1165 Type 2 diabetes mellitus with hyperglycemia: Secondary | ICD-10-CM

## 2024-08-25 DIAGNOSIS — Z23 Encounter for immunization: Secondary | ICD-10-CM | POA: Diagnosis not present

## 2024-08-25 DIAGNOSIS — Z7984 Long term (current) use of oral hypoglycemic drugs: Secondary | ICD-10-CM

## 2024-08-25 DIAGNOSIS — E1169 Type 2 diabetes mellitus with other specified complication: Secondary | ICD-10-CM

## 2024-08-25 DIAGNOSIS — R42 Dizziness and giddiness: Secondary | ICD-10-CM | POA: Diagnosis not present

## 2024-08-25 DIAGNOSIS — G2581 Restless legs syndrome: Secondary | ICD-10-CM

## 2024-08-25 DIAGNOSIS — E785 Hyperlipidemia, unspecified: Secondary | ICD-10-CM

## 2024-08-25 DIAGNOSIS — E1159 Type 2 diabetes mellitus with other circulatory complications: Secondary | ICD-10-CM | POA: Diagnosis not present

## 2024-08-25 MED ORDER — METFORMIN HCL ER 500 MG PO TB24: 500.0000 mg | ORAL_TABLET | Freq: Every day | ORAL | 0 refills | Status: AC

## 2024-08-25 NOTE — Progress Notes (Signed)
 "   Subjective   Patient ID: Katrina Obrien, female    DOB: 04-07-1956  Age: 69 y.o. MRN: [id number]  Chief Complaint  Patient presents with   Annual Exam     History of Present Illness   Katrina Obrien is a 69 year old female who presents with vertigo.  She has persistent vertigo that is worse at night, when fatigued, and in the dark. Balance is difficult in low light and she occasionally uses a cane for stability. Physical therapy has helped but she still has intermittent dizziness, especially when tired. She is not taking medication specifically for vertigo.  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 She walks her dog and manages household tasks. Headaches occur on the side of her head where she previously fell and often precede dizziness. She takes Qulipta  daily for headaches and occasionally uses Tylenol  at night.  Her current medications include Lipitor, Repatha , Entresto , metoprolol , Lasix  half tablet, Jardiance , Ozempic , warfarin, Lyrica , vitamin D , and vitamin B. Restless leg syndrome is controlled with Lyrica . She does not take metformin  due to severe diarrhea.  She has difficulty sleeping at night and often naps during the day, which disrupts nighttime sleep.  She has diabetes with a recent A1c of 7.1. She attributes the increase to holiday eating and is not on metformin  due to intolerance.  She has developing cataracts. She has aching in her feet at night that improves with walking. She is concerned about possible diabetic neuropathy.          The ASCVD Risk score (Arnett DK, et al., 2019) failed to calculate for the following reasons:   The valid total cholesterol range is 130 to 320 mg/dL  Health Maintenance Due  Topic Date Due   COVID-19 Vaccine (4 - 2025-26 season) 03/28/2024      Objective:     BP 126/70   Pulse 79   Ht 5' 3 (1.6 m)   Wt 159 lb (72.1 kg)   LMP  (LMP Unknown)   SpO2 100%   BMI 28.17  kg/m    Physical Exam   HEENT: MMM. Nystagmus observed. CHEST: Lungs clear to auscultation bilaterally. No wheezes, rhonchi, or crackles. NEUROLOGICAL: Sensation intact.        No results found for any visits on 08/25/24.      Assessment & Plan:   Uncontrolled type 2 diabetes mellitus with hyperglycemia (HCC) Assessment & Plan: A1c elevated at 7.1. Maximum doses of Jardiance  and Ozempic . Constipation possibly due to Ozempic . Discussed reintroducing metformin  or considering repaglinide. - Prescribed metformin  500 mg once daily, start now or next visit. - Recheck A1c in three months. - Urine protein test performed today or next visit. - Foot exam performed today.  Orders: -     Microalbumin / creatinine urine ratio  Immunization due -     Pneumococcal conjugate vaccine 20-valent  Vertigo Assessment & Plan: Persistent vertigo, especially at night. Previous physical therapy beneficial. Discussed betahistine use, may require compounding pharmacy. - Investigate availability of betahistine through compounding pharmacy.   Restless leg syndrome Assessment & Plan: Managed with Lyrica . Discussed potential benefits of iron supplementation. - Continue Lyrica . - Consider iron supplementation if symptoms persist.   Hyperlipidemia associated with type 2 diabetes mellitus (HCC) Assessment & Plan: Continue repatha  and lipitor 40   Hypertension associated with diabetes (HCC) Assessment & Plan: Continue metoprolol , entresto . Bp at goal today   Other orders -  metFORMIN  HCl ER; Take 1 tablet (500 mg total) by mouth daily with breakfast.  Dispense: 90 tablet; Refill: 0       Return in about 3 months (around 11/23/2024) for DM.    [name], MD  "

## 2024-08-25 NOTE — Patient Instructions (Addendum)
" ° °  YOUR PLAN: TYPE 2 DIABETES MELLITUS WITH HYPERGLYCEMIA: Your A1c level is elevated at 7.1, and we discussed options to manage your blood sugar levels. -Start taking metformin  500 mg once daily, either now or at your next visit. -We will recheck your A1c in three months. -A urine protein test was performed today or will be done at your next visit. -A foot exam was performed today.  CHRONIC VERTIGO: You have persistent vertigo, especially at night, and previous physical therapy has been beneficial. -if you are interested We will investigate the availability of betahistine through a compounding pharmacy.  DIABETIC POLYNEUROPATHY: You experience nighttime foot pain and cramping, which may be due to diabetic neuropathy. -Consider taking magnesium glycinate or oxide for cramps. -If cramps persist, consider iron supplementation. Ferrous gluconate or ferous sulfate are common formulatoins  RESTLESS LEGS SYNDROME: Your restless legs syndrome is managed with Lyrica . -Continue taking Lyrica . -Consider iron supplementation if symptoms persist.  GENERAL HEALTH MAINTENANCE: We reviewed your vaccinations and screenings, and discussed vitamin B supplementation. -If you have not received the pneumococcal vaccine, it will be administered. -Continue routine eye exams. -Consider switching to B12 supplementation.      "

## 2024-08-25 NOTE — Assessment & Plan Note (Signed)
 Persistent vertigo, especially at night. Previous physical therapy beneficial. Discussed betahistine use, may require compounding pharmacy. - Investigate availability of betahistine through compounding pharmacy.

## 2024-08-25 NOTE — Assessment & Plan Note (Signed)
 Managed with Lyrica . Discussed potential benefits of iron supplementation. - Continue Lyrica . - Consider iron supplementation if symptoms persist.

## 2024-08-25 NOTE — Assessment & Plan Note (Signed)
 A1c elevated at 7.1. Maximum doses of Jardiance  and Ozempic . Constipation possibly due to Ozempic . Discussed reintroducing metformin  or considering repaglinide. - Prescribed metformin  500 mg once daily, start now or next visit. - Recheck A1c in three months. - Urine protein test performed today or next visit. - Foot exam performed today.

## 2024-08-25 NOTE — Assessment & Plan Note (Signed)
 Continue metoprolol , entresto . Bp at goal today

## 2024-08-25 NOTE — Assessment & Plan Note (Signed)
 Continue repatha  and lipitor 40

## 2024-08-26 LAB — MICROALBUMIN / CREATININE URINE RATIO
Creatinine, Urine: 69.3 mg/dL
Microalb/Creat Ratio: 25 mg/g{creat} (ref 0–29)
Microalbumin, Urine: 17.4 ug/mL

## 2024-09-06 ENCOUNTER — Ambulatory Visit: Admitting: Vascular Surgery

## 2024-09-06 ENCOUNTER — Ambulatory Visit (HOSPITAL_COMMUNITY): Admission: RE | Admit: 2024-09-06

## 2024-09-19 ENCOUNTER — Ambulatory Visit

## 2024-11-23 ENCOUNTER — Ambulatory Visit: Admitting: Family Medicine

## 2025-03-30 ENCOUNTER — Ambulatory Visit
